# Patient Record
Sex: Female | Born: 1945 | Race: Black or African American | Hispanic: No | Smoking: Never smoker
Health system: Southern US, Community
[De-identification: ages and names within clinical notes are randomized; demographics above are authoritative.]

## PROBLEM LIST (undated history)

## (undated) DIAGNOSIS — K589 Irritable bowel syndrome without diarrhea: Secondary | ICD-10-CM

## (undated) DIAGNOSIS — T4145XA Adverse effect of unspecified anesthetic, initial encounter: Secondary | ICD-10-CM

## (undated) DIAGNOSIS — R7303 Prediabetes: Secondary | ICD-10-CM

## (undated) DIAGNOSIS — I10 Essential (primary) hypertension: Secondary | ICD-10-CM

## (undated) DIAGNOSIS — Z9889 Other specified postprocedural states: Secondary | ICD-10-CM

## (undated) DIAGNOSIS — K5790 Diverticulosis of intestine, part unspecified, without perforation or abscess without bleeding: Secondary | ICD-10-CM

## (undated) DIAGNOSIS — R2232 Localized swelling, mass and lump, left upper limb: Secondary | ICD-10-CM

## (undated) DIAGNOSIS — E669 Obesity, unspecified: Secondary | ICD-10-CM

## (undated) DIAGNOSIS — R112 Nausea with vomiting, unspecified: Secondary | ICD-10-CM

## (undated) DIAGNOSIS — C801 Malignant (primary) neoplasm, unspecified: Secondary | ICD-10-CM

## (undated) DIAGNOSIS — T8859XA Other complications of anesthesia, initial encounter: Secondary | ICD-10-CM

## (undated) DIAGNOSIS — L409 Psoriasis, unspecified: Secondary | ICD-10-CM

## (undated) DIAGNOSIS — E785 Hyperlipidemia, unspecified: Secondary | ICD-10-CM

## (undated) HISTORY — DX: Irritable bowel syndrome, unspecified: K58.9

## (undated) HISTORY — DX: Hyperlipidemia, unspecified: E78.5

## (undated) HISTORY — PX: BREAST SURGERY: SHX581

## (undated) HISTORY — PX: CHOLECYSTECTOMY: SHX55

## (undated) HISTORY — DX: Psoriasis, unspecified: L40.9

## (undated) HISTORY — PX: OTHER SURGICAL HISTORY: SHX169

## (undated) HISTORY — DX: Diverticulosis of intestine, part unspecified, without perforation or abscess without bleeding: K57.90

## (undated) HISTORY — DX: Obesity, unspecified: E66.9

## (undated) HISTORY — DX: Malignant (primary) neoplasm, unspecified: C80.1

## (undated) HISTORY — DX: Essential (primary) hypertension: I10

## (undated) HISTORY — PX: CHOLECYSTECTOMY, LAPAROSCOPIC: SHX56

---
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
## 1977-10-14 HISTORY — PX: TONSILLECTOMY: SUR1361

## 1998-10-14 HISTORY — PX: THYROGLOSSAL DUCT CYST: SHX297

## 1999-01-29 ENCOUNTER — Other Ambulatory Visit: Admission: RE | Admit: 1999-01-29 | Discharge: 1999-01-29 | Payer: Self-pay | Admitting: Nurse Practitioner

## 1999-11-22 ENCOUNTER — Emergency Department (HOSPITAL_COMMUNITY): Admission: EM | Admit: 1999-11-22 | Discharge: 1999-11-22 | Payer: Self-pay | Admitting: Emergency Medicine

## 1999-11-22 ENCOUNTER — Encounter: Payer: Self-pay | Admitting: Emergency Medicine

## 2000-02-18 ENCOUNTER — Other Ambulatory Visit: Admission: RE | Admit: 2000-02-18 | Discharge: 2000-02-18 | Payer: Self-pay | Admitting: Otolaryngology

## 2000-02-18 ENCOUNTER — Encounter (INDEPENDENT_AMBULATORY_CARE_PROVIDER_SITE_OTHER): Payer: Self-pay | Admitting: Specialist

## 2001-10-14 HISTORY — PX: VAGINAL HYSTERECTOMY: SUR661

## 2001-11-02 ENCOUNTER — Other Ambulatory Visit: Admission: RE | Admit: 2001-11-02 | Discharge: 2001-11-02 | Payer: Self-pay | Admitting: Internal Medicine

## 2002-03-16 ENCOUNTER — Encounter (INDEPENDENT_AMBULATORY_CARE_PROVIDER_SITE_OTHER): Payer: Self-pay

## 2002-03-16 ENCOUNTER — Inpatient Hospital Stay (HOSPITAL_COMMUNITY): Admission: RE | Admit: 2002-03-16 | Discharge: 2002-03-18 | Payer: Self-pay | Admitting: Obstetrics and Gynecology

## 2003-06-08 ENCOUNTER — Other Ambulatory Visit: Admission: RE | Admit: 2003-06-08 | Discharge: 2003-06-08 | Payer: Self-pay | Admitting: Obstetrics and Gynecology

## 2004-12-24 ENCOUNTER — Ambulatory Visit: Payer: Self-pay | Admitting: Internal Medicine

## 2004-12-31 ENCOUNTER — Ambulatory Visit: Payer: Self-pay | Admitting: Internal Medicine

## 2006-03-07 ENCOUNTER — Ambulatory Visit: Payer: Self-pay | Admitting: Internal Medicine

## 2006-03-14 ENCOUNTER — Ambulatory Visit: Payer: Self-pay | Admitting: Internal Medicine

## 2006-10-01 ENCOUNTER — Ambulatory Visit: Payer: Self-pay | Admitting: Internal Medicine

## 2006-10-01 LAB — CONVERTED CEMR LAB
BUN: 15 mg/dL (ref 6–23)
CO2: 28 meq/L (ref 19–32)
Calcium: 10 mg/dL (ref 8.4–10.5)
Chloride: 103 meq/L (ref 96–112)
Chol/HDL Ratio, serum: 4.5
Cholesterol: 190 mg/dL (ref 0–200)
Creatinine, Ser: 0.7 mg/dL (ref 0.4–1.2)
GFR calc non Af Amer: 91 mL/min
Glomerular Filtration Rate, Af Am: 110 mL/min/{1.73_m2}
Glucose, Bld: 115 mg/dL — ABNORMAL HIGH (ref 70–99)
HDL: 42.4 mg/dL (ref >39.0)
Hgb A1c MFr Bld: 6.2 % — ABNORMAL HIGH (ref 4.6–6.0)
LDL Cholesterol: 127 mg/dL — ABNORMAL HIGH (ref 0–99)
Potassium: 4 meq/L (ref 3.5–5.1)
Sodium: 139 meq/L (ref 135–145)
Triglyceride fasting, serum: 104 mg/dL (ref 0–149)
VLDL: 21 mg/dL (ref 0–40)

## 2007-06-30 ENCOUNTER — Ambulatory Visit: Payer: Self-pay | Admitting: Internal Medicine

## 2007-06-30 LAB — CONVERTED CEMR LAB
ALT: 19 units/L (ref 0–35)
AST: 17 units/L (ref 0–37)
Albumin: 4 g/dL (ref 3.5–5.2)
Alkaline Phosphatase: 53 units/L (ref 39–117)
BUN: 14 mg/dL (ref 6–23)
Basophils Absolute: 0.1 10*3/uL (ref 0.0–0.1)
Basophils Relative: 2 % — ABNORMAL HIGH (ref 0.0–1.0)
Bilirubin Urine: NEGATIVE
Bilirubin, Direct: 0.1 mg/dL (ref 0.0–0.3)
CO2: 27 meq/L (ref 19–32)
Calcium: 9.6 mg/dL (ref 8.4–10.5)
Chloride: 107 meq/L (ref 96–112)
Cholesterol: 213 mg/dL (ref 0–200)
Creatinine, Ser: 0.8 mg/dL (ref 0.4–1.2)
Direct LDL: 150.5 mg/dL
Eosinophils Absolute: 0.1 10*3/uL (ref 0.0–0.6)
Eosinophils Relative: 3.8 % (ref 0.0–5.0)
GFR calc Af Amer: 94 mL/min
GFR calc non Af Amer: 78 mL/min
Glucose, Bld: 132 mg/dL — ABNORMAL HIGH (ref 70–99)
HCT: 40.9 % (ref 36.0–46.0)
HDL: 34.2 mg/dL — ABNORMAL LOW (ref >39.0)
Hemoglobin, Urine: NEGATIVE
Hemoglobin: 14.1 g/dL (ref 12.0–15.0)
Ketones, ur: NEGATIVE mg/dL
Leukocytes, UA: NEGATIVE
Lymphocytes Relative: 47.1 % — ABNORMAL HIGH (ref 12.0–46.0)
MCHC: 34.6 g/dL (ref 30.0–36.0)
MCV: 86.9 fL (ref 78.0–100.0)
Monocytes Absolute: 0.3 10*3/uL (ref 0.2–0.7)
Monocytes Relative: 8.5 % (ref 3.0–11.0)
Neutro Abs: 1.4 10*3/uL (ref 1.4–7.7)
Neutrophils Relative %: 38.6 % — ABNORMAL LOW (ref 43.0–77.0)
Nitrite: NEGATIVE
Platelets: 210 10*3/uL (ref 150–400)
Potassium: 3.6 meq/L (ref 3.5–5.1)
RBC: 4.7 M/uL (ref 3.87–5.11)
RDW: 12 % (ref 11.5–14.6)
Sodium: 142 meq/L (ref 135–145)
Specific Gravity, Urine: >=1.03 (ref 1.000–1.03)
TSH: 1.63 microintl units/mL (ref 0.35–5.50)
Total Bilirubin: 0.9 mg/dL (ref 0.3–1.2)
Total CHOL/HDL Ratio: 6.2
Total Protein, Urine: NEGATIVE mg/dL
Total Protein: 7 g/dL (ref 6.0–8.3)
Triglycerides: 151 mg/dL — ABNORMAL HIGH (ref 0–149)
Urine Glucose: NEGATIVE mg/dL
Urobilinogen, UA: 0.2 (ref 0.0–1.0)
VLDL: 30 mg/dL (ref 0–40)
WBC: 3.5 10*3/uL — ABNORMAL LOW (ref 4.5–10.5)
pH: 6 (ref 5.0–8.0)

## 2007-07-06 ENCOUNTER — Encounter: Payer: Self-pay | Admitting: Internal Medicine

## 2007-07-06 ENCOUNTER — Ambulatory Visit: Payer: Self-pay | Admitting: Internal Medicine

## 2007-07-06 DIAGNOSIS — E119 Type 2 diabetes mellitus without complications: Secondary | ICD-10-CM

## 2007-07-06 DIAGNOSIS — E785 Hyperlipidemia, unspecified: Secondary | ICD-10-CM | POA: Insufficient documentation

## 2007-07-06 DIAGNOSIS — I10 Essential (primary) hypertension: Secondary | ICD-10-CM

## 2007-07-07 DIAGNOSIS — E669 Obesity, unspecified: Secondary | ICD-10-CM | POA: Insufficient documentation

## 2007-09-04 ENCOUNTER — Ambulatory Visit: Payer: Self-pay | Admitting: Internal Medicine

## 2007-09-04 LAB — CONVERTED CEMR LAB
ALT: 24 units/L (ref 0–35)
AST: 24 units/L (ref 0–37)
Cholesterol: 112 mg/dL (ref 0–200)
HDL: 35.8 mg/dL — ABNORMAL LOW (ref >39.0)
LDL Cholesterol: 64 mg/dL (ref 0–99)
Total CHOL/HDL Ratio: 3.1
Triglycerides: 62 mg/dL (ref 0–149)
VLDL: 12 mg/dL (ref 0–40)

## 2007-09-05 ENCOUNTER — Encounter: Payer: Self-pay | Admitting: Internal Medicine

## 2008-02-13 ENCOUNTER — Encounter: Payer: Self-pay | Admitting: *Deleted

## 2008-02-13 DIAGNOSIS — K589 Irritable bowel syndrome without diarrhea: Secondary | ICD-10-CM

## 2008-02-13 DIAGNOSIS — Z9889 Other specified postprocedural states: Secondary | ICD-10-CM | POA: Insufficient documentation

## 2008-02-13 DIAGNOSIS — K648 Other hemorrhoids: Secondary | ICD-10-CM | POA: Insufficient documentation

## 2008-02-13 DIAGNOSIS — K5730 Diverticulosis of large intestine without perforation or abscess without bleeding: Secondary | ICD-10-CM | POA: Insufficient documentation

## 2008-02-13 DIAGNOSIS — Z9089 Acquired absence of other organs: Secondary | ICD-10-CM

## 2008-09-20 ENCOUNTER — Encounter: Payer: Self-pay | Admitting: Internal Medicine

## 2008-09-26 ENCOUNTER — Ambulatory Visit: Payer: Self-pay | Admitting: Internal Medicine

## 2008-09-26 LAB — CONVERTED CEMR LAB
ALT: 23 units/L (ref 0–35)
AST: 24 units/L (ref 0–37)
Albumin: 4 g/dL (ref 3.5–5.2)
Alkaline Phosphatase: 45 units/L (ref 39–117)
BUN: 15 mg/dL (ref 6–23)
Basophils Absolute: 0 10*3/uL (ref 0.0–0.1)
Basophils Relative: 0.6 % (ref 0.0–3.0)
Bilirubin Urine: NEGATIVE
Bilirubin, Direct: 0.1 mg/dL (ref 0.0–0.3)
CO2: 32 meq/L (ref 19–32)
Calcium: 9.6 mg/dL (ref 8.4–10.5)
Chloride: 104 meq/L (ref 96–112)
Cholesterol: 177 mg/dL (ref 0–200)
Creatinine, Ser: 0.7 mg/dL (ref 0.4–1.2)
Direct LDL: 115.2 mg/dL
Eosinophils Absolute: 0.2 10*3/uL (ref 0.0–0.7)
Eosinophils Relative: 3.7 % (ref 0.0–5.0)
GFR calc Af Amer: 109 mL/min
GFR calc non Af Amer: 90 mL/min
Glucose, Bld: 119 mg/dL — ABNORMAL HIGH (ref 70–99)
HCT: 39 % (ref 36.0–46.0)
HDL: 35.8 mg/dL — ABNORMAL LOW (ref >39.0)
Hemoglobin, Urine: NEGATIVE
Hemoglobin: 13.8 g/dL (ref 12.0–15.0)
Ketones, ur: NEGATIVE mg/dL
Leukocytes, UA: NEGATIVE
Lymphocytes Relative: 48.3 % — ABNORMAL HIGH (ref 12.0–46.0)
MCHC: 35.5 g/dL (ref 30.0–36.0)
MCV: 87.5 fL (ref 78.0–100.0)
Monocytes Absolute: 0.5 10*3/uL (ref 0.1–1.0)
Monocytes Relative: 9.8 % (ref 3.0–12.0)
Neutro Abs: 1.7 10*3/uL (ref 1.4–7.7)
Neutrophils Relative %: 37.6 % — ABNORMAL LOW (ref 43.0–77.0)
Nitrite: NEGATIVE
Platelets: 205 10*3/uL (ref 150–400)
Potassium: 3.4 meq/L — ABNORMAL LOW (ref 3.5–5.1)
RBC: 4.46 M/uL (ref 3.87–5.11)
RDW: 12.2 % (ref 11.5–14.6)
Sodium: 141 meq/L (ref 135–145)
Specific Gravity, Urine: 1.015 (ref 1.000–1.03)
TSH: 1.02 microintl units/mL (ref 0.35–5.50)
Total Bilirubin: 0.6 mg/dL (ref 0.3–1.2)
Total CHOL/HDL Ratio: 4.9
Total Protein, Urine: NEGATIVE mg/dL
Total Protein: 6.7 g/dL (ref 6.0–8.3)
Triglycerides: 202 mg/dL (ref 0–149)
Urine Glucose: NEGATIVE mg/dL
Urobilinogen, UA: 1 (ref 0.0–1.0)
VLDL: 40 mg/dL (ref 0–40)
WBC: 4.6 10*3/uL (ref 4.5–10.5)
pH: 6 (ref 5.0–8.0)

## 2008-10-10 ENCOUNTER — Ambulatory Visit: Payer: Self-pay | Admitting: Internal Medicine

## 2008-12-01 ENCOUNTER — Ambulatory Visit: Payer: Self-pay | Admitting: Internal Medicine

## 2008-12-01 LAB — CONVERTED CEMR LAB
ALT: 19 units/L (ref 0–35)
AST: 20 units/L (ref 0–37)
Albumin: 4.4 g/dL (ref 3.5–5.2)
Alkaline Phosphatase: 48 units/L (ref 39–117)
Bilirubin, Direct: 0.1 mg/dL (ref 0.0–0.3)
Cholesterol: 122 mg/dL (ref 0–200)
HDL: 32.9 mg/dL — ABNORMAL LOW (ref >39.0)
Hgb A1c MFr Bld: 6.8 % — ABNORMAL HIGH (ref 4.6–6.0)
LDL Cholesterol: 72 mg/dL (ref 0–99)
Total Bilirubin: 0.9 mg/dL (ref 0.3–1.2)
Total CHOL/HDL Ratio: 3.7
Total Protein: 7.3 g/dL (ref 6.0–8.3)
Triglycerides: 87 mg/dL (ref 0–149)
VLDL: 17 mg/dL (ref 0–40)

## 2008-12-11 ENCOUNTER — Encounter: Payer: Self-pay | Admitting: Internal Medicine

## 2009-03-23 ENCOUNTER — Ambulatory Visit: Payer: Self-pay | Admitting: Internal Medicine

## 2009-03-23 DIAGNOSIS — R609 Edema, unspecified: Secondary | ICD-10-CM

## 2009-03-23 DIAGNOSIS — R209 Unspecified disturbances of skin sensation: Secondary | ICD-10-CM

## 2009-03-23 LAB — CONVERTED CEMR LAB
BUN: 15 mg/dL (ref 6–23)
CO2: 29 meq/L (ref 19–32)
Calcium: 9.6 mg/dL (ref 8.4–10.5)
Chloride: 105 meq/L (ref 96–112)
Cholesterol: 126 mg/dL (ref 0–200)
Creatinine, Ser: 0.8 mg/dL (ref 0.4–1.2)
GFR calc non Af Amer: 93.13 mL/min (ref >60)
Glucose, Bld: 142 mg/dL — ABNORMAL HIGH (ref 70–99)
HDL: 38.5 mg/dL — ABNORMAL LOW (ref >39.00)
Hgb A1c MFr Bld: 6.9 % — ABNORMAL HIGH (ref 4.6–6.5)
LDL Cholesterol: 72 mg/dL (ref 0–99)
Potassium: 3.7 meq/L (ref 3.5–5.1)
Sodium: 141 meq/L (ref 135–145)
Total CHOL/HDL Ratio: 3
Triglycerides: 77 mg/dL (ref 0.0–149.0)
VLDL: 15.4 mg/dL (ref 0.0–40.0)

## 2009-03-25 DIAGNOSIS — F411 Generalized anxiety disorder: Secondary | ICD-10-CM | POA: Insufficient documentation

## 2009-05-15 IMAGING — CR DG ABDOMEN 1V
2 series · 2 of 2 positions shown · non-contrast
Comparison: CT from [HOSPITAL] dated [DATE]

CLINICAL DATA: Preop for surgery for left renal calculus

ABDOMEN - 1 VIEW

[t abdomen supine * (1 of 2)]
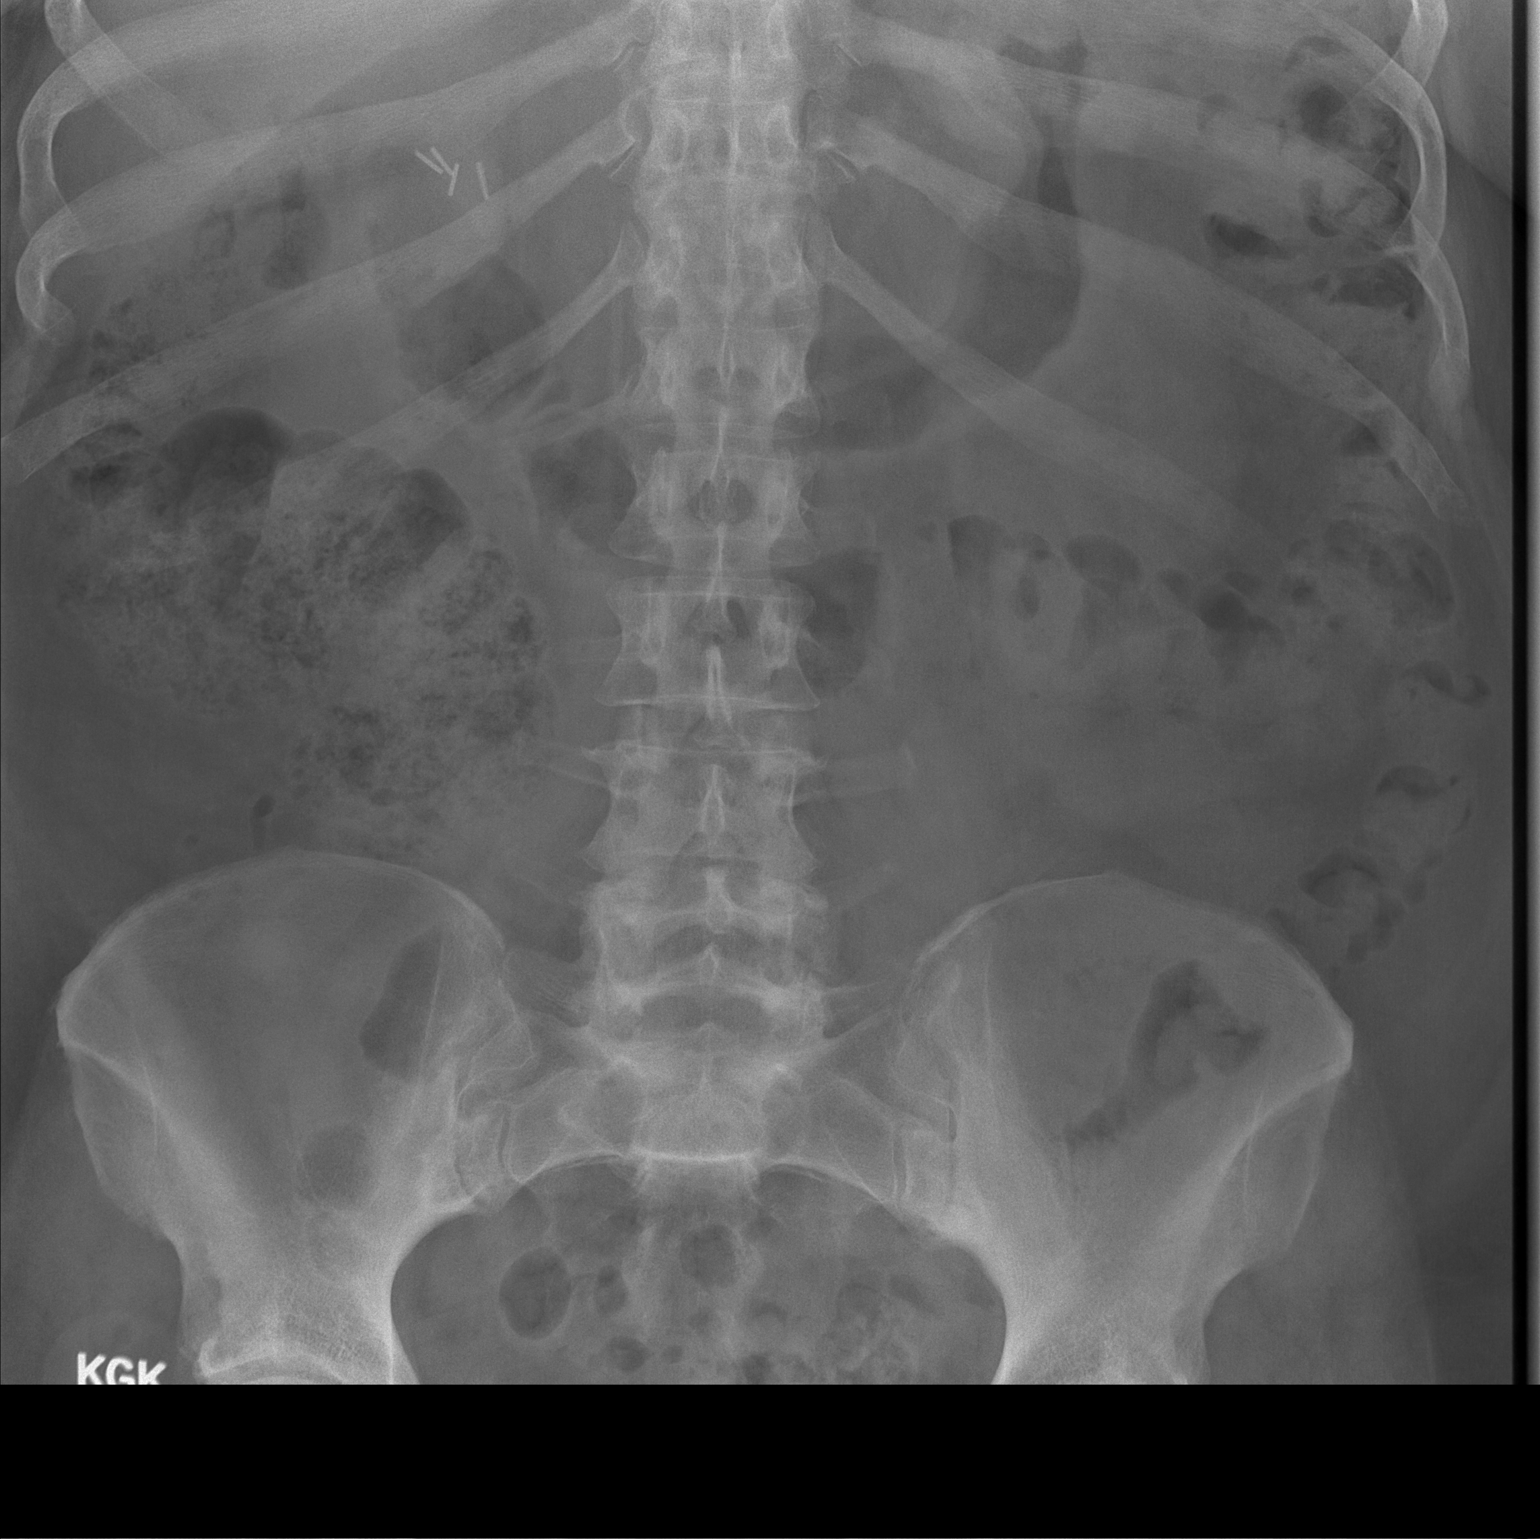

[t abdomen supine * (2 of 2)]
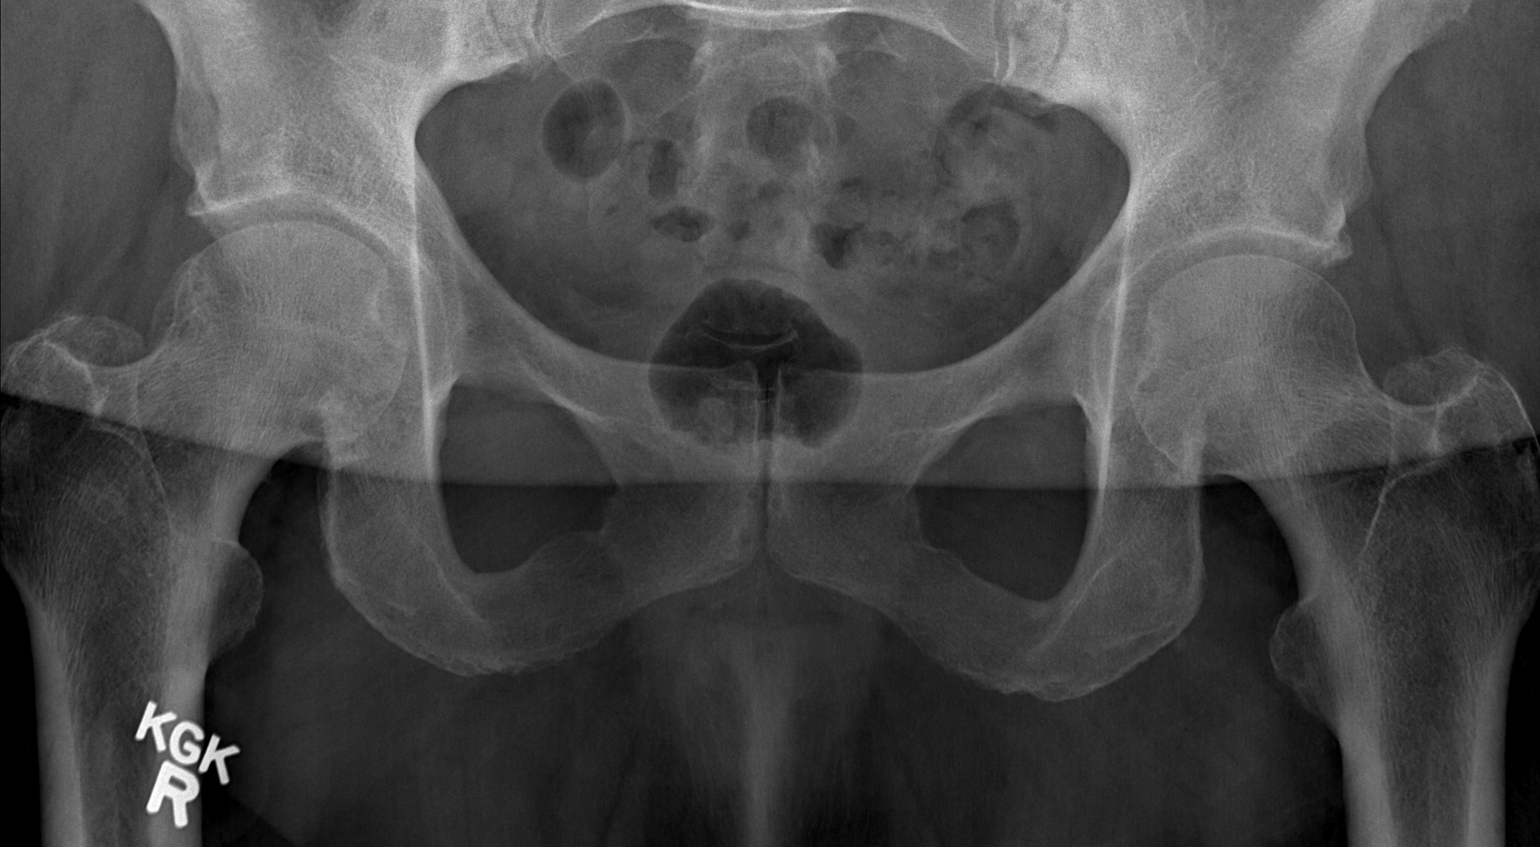

[2 of 2 positions shown; findings below may reference images not displayed]

FINDINGS: Plain film does not demonstrate the left renal pelvic
calculus noted on the prior CT.  No renal or ureteral calculi are
noted with certainty by plain film.  The bowel gas pattern is
nonspecific.  Surgical clips are present in the right upper
quadrant from prior cholecystectomy.
IMPRESSION: No definite renal or ureteral calculus is noted by plain film.

## 2009-07-06 ENCOUNTER — Ambulatory Visit: Payer: Self-pay | Admitting: Internal Medicine

## 2009-10-04 ENCOUNTER — Ambulatory Visit: Payer: Self-pay | Admitting: Internal Medicine

## 2009-10-04 LAB — CONVERTED CEMR LAB
ALT: 28 units/L (ref 0–35)
AST: 24 units/L (ref 0–37)
Albumin: 4.2 g/dL (ref 3.5–5.2)
Alkaline Phosphatase: 42 units/L (ref 39–117)
BUN: 14 mg/dL (ref 6–23)
Basophils Absolute: 0 10*3/uL (ref 0.0–0.1)
Basophils Relative: 0.8 % (ref 0.0–3.0)
Bilirubin Urine: NEGATIVE
Bilirubin, Direct: 0.1 mg/dL (ref 0.0–0.3)
CO2: 27 meq/L (ref 19–32)
Calcium: 9.4 mg/dL (ref 8.4–10.5)
Chloride: 110 meq/L (ref 96–112)
Cholesterol: 124 mg/dL (ref 0–200)
Creatinine, Ser: 0.7 mg/dL (ref 0.4–1.2)
Eosinophils Absolute: 0.2 10*3/uL (ref 0.0–0.7)
Eosinophils Relative: 4.3 % (ref 0.0–5.0)
GFR calc non Af Amer: 108.46 mL/min (ref >60)
Glucose, Bld: 118 mg/dL — ABNORMAL HIGH (ref 70–99)
HCT: 39.7 % (ref 36.0–46.0)
HDL: 38.3 mg/dL — ABNORMAL LOW (ref >39.00)
Hemoglobin, Urine: NEGATIVE
Hemoglobin: 13.2 g/dL (ref 12.0–15.0)
Ketones, ur: NEGATIVE mg/dL
LDL Cholesterol: 68 mg/dL (ref 0–99)
Leukocytes, UA: NEGATIVE
Lymphocytes Relative: 40.8 % (ref 12.0–46.0)
Lymphs Abs: 1.6 10*3/uL (ref 0.7–4.0)
MCHC: 33.2 g/dL (ref 30.0–36.0)
MCV: 89.3 fL (ref 78.0–100.0)
Monocytes Absolute: 0.4 10*3/uL (ref 0.1–1.0)
Monocytes Relative: 9 % (ref 3.0–12.0)
Neutro Abs: 1.8 10*3/uL (ref 1.4–7.7)
Neutrophils Relative %: 45.1 % (ref 43.0–77.0)
Nitrite: NEGATIVE
Platelets: 175 10*3/uL (ref 150.0–400.0)
Potassium: 3.8 meq/L (ref 3.5–5.1)
RBC: 4.45 M/uL (ref 3.87–5.11)
RDW: 12.3 % (ref 11.5–14.6)
Sodium: 145 meq/L (ref 135–145)
Specific Gravity, Urine: >=1.03 (ref 1.000–1.030)
TSH: 1.19 microintl units/mL (ref 0.35–5.50)
Total Bilirubin: 0.8 mg/dL (ref 0.3–1.2)
Total CHOL/HDL Ratio: 3
Total Protein, Urine: NEGATIVE mg/dL
Total Protein: 6.9 g/dL (ref 6.0–8.3)
Triglycerides: 91 mg/dL (ref 0.0–149.0)
Urine Glucose: NEGATIVE mg/dL
Urobilinogen, UA: 0.2 (ref 0.0–1.0)
VLDL: 18.2 mg/dL (ref 0.0–40.0)
WBC: 4 10*3/uL — ABNORMAL LOW (ref 4.5–10.5)
pH: 5.5 (ref 5.0–8.0)

## 2009-10-14 HISTORY — PX: OTHER SURGICAL HISTORY: SHX169

## 2009-10-26 ENCOUNTER — Encounter: Payer: Self-pay | Admitting: Internal Medicine

## 2010-04-20 ENCOUNTER — Ambulatory Visit: Payer: Self-pay | Admitting: Internal Medicine

## 2010-04-20 LAB — CONVERTED CEMR LAB
Alkaline Phosphatase: 45 units/L (ref 39–117)
BUN: 16 mg/dL (ref 6–23)
Basophils Relative: 0.6 % (ref 0.0–3.0)
Bilirubin Urine: NEGATIVE
Bilirubin, Direct: 0.1 mg/dL (ref 0.0–0.3)
CO2: 27 meq/L (ref 19–32)
Chloride: 114 meq/L — ABNORMAL HIGH (ref 96–112)
Eosinophils Absolute: 0.1 10*3/uL (ref 0.0–0.7)
Eosinophils Relative: 4.8 % (ref 0.0–5.0)
Glucose, Bld: 125 mg/dL — ABNORMAL HIGH (ref 70–99)
HDL: 32.5 mg/dL — ABNORMAL LOW (ref >39.00)
LDL Cholesterol: 64 mg/dL (ref 0–99)
Leukocytes, UA: NEGATIVE
Lymphocytes Relative: 47.5 % — ABNORMAL HIGH (ref 12.0–46.0)
MCV: 88.3 fL (ref 78.0–100.0)
Monocytes Absolute: 0.3 10*3/uL (ref 0.1–1.0)
Neutrophils Relative %: 37.6 % — ABNORMAL LOW (ref 43.0–77.0)
Nitrite: NEGATIVE
Platelets: 190 10*3/uL (ref 150.0–400.0)
Potassium: 4.2 meq/L (ref 3.5–5.1)
RBC: 4.47 M/uL (ref 3.87–5.11)
Sodium: 144 meq/L (ref 135–145)
Specific Gravity, Urine: >=1.03 (ref 1.000–1.030)
Total Bilirubin: 0.7 mg/dL (ref 0.3–1.2)
Total Protein, Urine: NEGATIVE mg/dL
Total Protein: 6.6 g/dL (ref 6.0–8.3)
VLDL: 19.2 mg/dL (ref 0.0–40.0)
WBC: 3.1 10*3/uL — ABNORMAL LOW (ref 4.5–10.5)
pH: 6 (ref 5.0–8.0)

## 2010-04-27 ENCOUNTER — Ambulatory Visit: Payer: Self-pay | Admitting: Internal Medicine

## 2010-04-27 DIAGNOSIS — R3129 Other microscopic hematuria: Secondary | ICD-10-CM | POA: Insufficient documentation

## 2010-05-24 ENCOUNTER — Encounter: Payer: Self-pay | Admitting: Internal Medicine

## 2010-06-01 ENCOUNTER — Encounter: Payer: Self-pay | Admitting: Internal Medicine

## 2010-08-02 ENCOUNTER — Ambulatory Visit: Payer: Self-pay | Admitting: Internal Medicine

## 2010-08-02 LAB — CONVERTED CEMR LAB
BUN: 14 mg/dL (ref 6–23)
Calcium: 9.7 mg/dL (ref 8.4–10.5)
Creatinine, Ser: 0.8 mg/dL (ref 0.4–1.2)
GFR calc non Af Amer: 95.48 mL/min (ref >60)
Glucose, Bld: 118 mg/dL — ABNORMAL HIGH (ref 70–99)
Hgb A1c MFr Bld: 6.9 % — ABNORMAL HIGH (ref 4.6–6.5)

## 2010-08-08 ENCOUNTER — Encounter: Payer: Self-pay | Admitting: Internal Medicine

## 2010-08-17 ENCOUNTER — Ambulatory Visit (HOSPITAL_BASED_OUTPATIENT_CLINIC_OR_DEPARTMENT_OTHER): Admission: RE | Admit: 2010-08-17 | Discharge: 2010-08-17 | Payer: Self-pay | Admitting: Urology

## 2010-08-24 ENCOUNTER — Encounter: Payer: Self-pay | Admitting: Internal Medicine

## 2010-11-13 NOTE — Consult Note (Signed)
Summary: [facility]  [facility]   Imported By: Lester Grandview 06/01/2010 10:29:38  _____________________________________________________________________  External Attachment:    Type:   Image     Comment:   External Document

## 2010-11-13 NOTE — Letter (Signed)
   [facility] [address] [place], [place]  [address] Phone: [phone]      August 09, 2010   [facility] [address] [place], [place] [address]  RE:  LAB RESULTS  Dear  Ms. Schepers,  The following is an interpretation of your most recent lab tests.  Please take note of any instructions provided or changes to medications that have resulted from your lab work.  ELECTROLYTES:  Good - no changes needed  KIDNEY FUNCTION TESTS:  Good - no changes needed    DIABETIC STUDIES:  Excellent - no changes needed Blood Glucose: 118   HgbA1C: 6.9      Lab results look good.  Please come see me if you have any questions about these lab results.   Sincerely Yours,    Jacques Navy MD

## 2010-11-13 NOTE — Letter (Signed)
Summary: [facility]   Imported By: Sherian Rein 08/31/2010 11:05:00  _____________________________________________________________________  External Attachment:    Type:   Image     Comment:   External Document

## 2010-11-13 NOTE — Assessment & Plan Note (Signed)
Summary: CPX / NWS  #   Vital Signs:  Patient profile:   65 year old female Height:      63 inches Weight:      284 pounds BMI:     50.49 O2 Sat:      97 % on Room air Temp:     98.5 degrees F oral Pulse rate:   72 / minute BP sitting:   112 / 76  (left arm) Cuff size:   large  Vitals Entered By: Bill Salinas CMA (2010-05-17 2:43 PM)  O2 Flow:  Room air CC: CPX/ ab  Vision Screening:      Vision Comments: Normal Eye exam with Dr Lawerance Bach in June 2010   Primary Care Provider:  Norins  CC:  CPX/ ab.  History of Present Illness: Patinet presents for routine general medical exam. Feels well and is doing well. She does have pedal edema which resolves overnight. She has no other complaints.   Preventive Screening-Counseling & Management  Alcohol-Tobacco     Alcohol drinks/day: <1     Smoking Status: never  Current Medications (verified): 1)  Furosemide 20 Mg Tabs (Furosemide) .Marland Kitchen.. 1 -2 By Mouth Once Daily As Needed Swelling 2)  Monopril 10 Mg  Tabs (Fosinopril Sodium) .... Once Daily 3)  Simvastatin 20 Mg Tabs (Simvastatin) .Marland Kitchen.. 1 By Mouth Q Pm  Allergies (verified): No Known Drug Allergies  Past History:  Past Medical History: Last updated: 03/23/2009 UCD HEMORRHOIDS, INTERNAL (ICD-455.0) DIVERTICULOSIS, COLON (ICD-562.10) IBS (ICD-564.1) OBESITY NOS (ICD-278.00) HYPERTENSION (ICD-401.9) HYPERLIPIDEMIA (ICD-272.4) DIABETES MELLITUS, TYPE II (ICD-250.00)   Anxiety  Past Surgical History: Last updated: 02/13/2008 UMBILICAL HERNIORRHAPHY, HX OF (ICD-V45.89) THYROGLOSSAL DUCT CYST CORRECTION (ICD-759.2) CHOLECYSTECTOMY, LAPAROSCOPIC, HX OF (ICD-V45.79) * TOTAL  VAGINAL HYSTERECTOMY/BSO  Family History: Last updated: May 17, 2010 Father - deceased @ 83:CAD/MI, diabetes, HTN Mother - deceased @ 85:HTN Neg- breast or colon cancer;   Physician roster             Derm - Dr. Terri Piedra             optometrist - Dr. Dorette Grate  Social History: Last updated:  10/10/2008 [employer], [employer] for 2 years married- '68- 2 yrs/widowed; '76- 47yrs/ widowed '93 1 son- '77, 1 daughter- '78; , 2 grandchildren work- Education officer, museum support Lives alone - I-ADLs  Risk Factors: Smoking Status: never (05/17/10)  Family History: Father - deceased @ 83:CAD/MI, diabetes, HTN Mother - deceased @ 85:HTN Neg- breast or colon cancer;   Physician roster             Derm - Dr. Terri Piedra             optometrist - Dr. Dorette Grate  Review of Systems       The patient complains of peripheral edema and incontinence.  The patient denies anorexia, fever, weight loss, weight gain, vision loss, decreased hearing, chest pain, syncope, dyspnea on exertion, prolonged cough, headaches, melena, hematochezia, severe indigestion/heartburn, suspicious skin lesions, difficulty walking, depression, enlarged lymph nodes, and angioedema.    Physical Exam  General:  Overweight AA female in no distress Head:  Normocephalic and atraumatic without obvious abnormalities. No apparent alopecia or balding. Eyes:  No corneal or conjunctival inflammation noted. EOMI. Perrla. Funduscopic exam benign, without hemorrhages, exudates or papilledema. Vision grossly normal. Ears:  External ear exam shows no significant lesions or deformities.  Otoscopic examination reveals clear canals, tympanic membranes are intact bilaterally without bulging, retraction, inflammation or discharge. Hearing is grossly normal bilaterally. Nose:  no external deformity and no external erythema.   Mouth:  no oral lesions, posterior pharynx is clear Neck:  supple, no thyromegaly, and no carotid bruits.   Chest Wall:  No deformities, masses, or tenderness noted. Breasts:  Pendulous, skin normal, nipples without discharge, mild fibrocystic type changes without fixed nodule or mass. Lungs:  Normal respiratory effort, chest expands symmetrically. Lungs are clear to auscultation, no crackles or wheezes. Heart:  normal rate, regular  rhythm, no murmur, no gallop, no rub, and no JVD.   Abdomen:  obese, which hindered further exam Genitalia:  deferred Msk:  normal ROM, no joint tenderness, no joint swelling, no joint warmth, and no joint deformities.   Pulses:  2+ radial and PT pulses Extremities:  1+ distal LE edema Neurologic:  alert & oriented X3, cranial nerves II-XII intact, strength normal in all extremities, gait normal, and DTRs symmetrical and normal.   Skin:  turgor normal, color normal, no rashes, and no suspicious lesions.   Cervical Nodes:  no anterior cervical adenopathy and no posterior cervical adenopathy.   Axillary Nodes:  no R axillary adenopathy and no L axillary adenopathy.   Psych:  Oriented X3, memory intact for recent and remote, normally interactive, and good eye contact.     Impression & Recommendations:  Problem # 1:  MICROSCOPIC HEMATURIA (ICD-599.72) Patient with microscopic hematuria. she is asymptomatic but over 50. Discussed the nature of this as a sentinal sign.  Plan - refer to Urology for evalaution.  Orders: Urology Referral (Urology) Urology Referral (Urology)  Problem # 2:  IBS (ICD-564.1) Stable with no recent flare in symptoms  Problem # 3:  HYPERTENSION (ICD-401.9)  Her updated medication list for this problem includes:    Furosemide 20 Mg Tabs (Furosemide) .Marland Kitchen... 1 -2 by mouth once daily as needed swelling    Monopril 10 Mg Tabs (Fosinopril sodium) ..... Once daily  BP today: 112/76 Prior BP: 124/78 (07/06/2009)  Good control on present medications  Problem # 4:  HYPERLIPIDEMIA (ICD-272.4) Good control with LDL of 64.  Plan - continue present medication.  Her updated medication list for this problem includes:    Simvastatin 20 Mg Tabs (Simvastatin) .Marland Kitchen... 1 by mouth q pm  Problem # 5:  DIABETES MELLITUS, TYPE II (ICD-250.00) No A1C with current labs. Last A1C June '10 was 6.9% with serum glucose of 145. Current last with glucose of 125 - will assume similar  A1C.  Plan - continued dietary management.           Will need A1C at next blood draw.  Her updated medication list for this problem includes:    Monopril 10 Mg Tabs (Fosinopril sodium) ..... Once daily  Problem # 6:  Preventive Health Care (ICD-V70.0) Unremarkable history with normal physical exam except for obesity. Lab results are normal except for microhematuria. She is current with mammography and colonsocopy in '05. She is current with tetnus and is a candidate for shingles and pneumonia vaccine. She is in good spirits and continues to work. Minmal fall risk determined.  In summary - a very nice woman who is medically stable. She will need an A1C at the next blod draw. She is a candidate for vaccines as noted. She will return in 6 months for follow-up.  Complete Medication List: 1)  Furosemide 20 Mg Tabs (Furosemide) .Marland Kitchen.. 1 -2 by mouth once daily as needed swelling 2)  Monopril 10 Mg Tabs (Fosinopril sodium) .... Once daily 3)  Simvastatin 20 Mg Tabs (Simvastatin) .Marland KitchenMarland KitchenMarland Kitchen  1 by mouth q pm  Patient: Connie Marshall Note: All result statuses are Final unless otherwise noted.  Tests: (1) BMP (METABOL)   Sodium                    144 mEq/L                   135-145   Potassium                 4.2 mEq/L                   3.5-5.1   Chloride             [H]  114 mEq/L                   96-112   Carbon Dioxide            27 mEq/L                    19-32   Glucose              [H]  125 mg/dL                   10-27   BUN                       16 mg/dL                    2-53   Creatinine                0.6 mg/dL                   6.6-4.4   Calcium                   9.2 mg/dL                   0.3-47.4   GFR                       122.28 mL/min               >60  Tests: (2) Lipid Panel (LIPID)   Cholesterol               116 mg/dL                   2-595     ATP III Classification            Desirable:  < 200 mg/dL                    Borderline High:  200 - 239 mg/dL               High:  > =  240 mg/dL   Triglycerides             96.0 mg/dL                  6.3-875.6     Normal:  <150 mg/dL     Borderline High:  433 - 199 mg/dL   HDL                  [L]  29.51 mg/dL                 >88.41  VLDL Cholesterol          19.2 mg/dL                  1.6-10.9   LDL Cholesterol           64 mg/dL                    6-04  CHO/HDL Ratio:  CHD Risk                             4                    Men          Women     1/2 Average Risk     3.4          3.3     Average Risk          5.0          4.4     2X Average Risk          9.6          7.1     3X Average Risk          15.0          11.0                           Tests: (3) CBC Platelet w/Diff (CBCD)   White Cell Count     [L]  3.1 K/uL                    4.5-10.5   Red Cell Count            4.47 Mil/uL                 3.87-5.11   Hemoglobin                13.6 g/dL                   54.0-98.1   Hematocrit                39.4 %                      36.0-46.0   MCV                       88.3 fl                     78.0-100.0   MCHC                      34.5 g/dL                   19.1-47.8   RDW                       12.7 %                      11.5-14.6   Platelet Count            190.0 K/uL                  150.0-400.0   Neutrophil %         [  L]  37.6 %                      43.0-77.0   Lymphocyte %         [H]  47.5 %                      12.0-46.0   Monocyte %                9.5 %                       3.0-12.0   Eosinophils%              4.8 %                       0.0-5.0   Basophils %               0.6 %                       0.0-3.0   Neutrophill Absolute [L]  1.2 K/uL                    1.4-7.7   Lymphocyte Absolute       1.5 K/uL                    0.7-4.0   Monocyte Absolute         0.3 K/uL                    0.1-1.0  Eosinophils, Absolute                             0.1 K/uL                    0.0-0.7   Basophils Absolute        0.0 K/uL                    0.0-0.1  Tests: (4) Hepatic/Liver Function Panel  (HEPATIC)   Total Bilirubin           0.7 mg/dL                   1.6-1.0   Direct Bilirubin          0.1 mg/dL                   9.6-0.4   Alkaline Phosphatase      45 U/L                      39-117   AST                       17 U/L                      0-37   ALT                       17 U/L                      0-35   Total Protein             6.6 g/dL  6.0-8.3   Albumin                   4.1 g/dL                    2.7-2.5  Tests: (5) TSH (TSH)   FastTSH                   1.25 uIU/mL                 0.35-5.50  Tests: (6) UDip w/Micro (URINE)   Color                     YELLOW       RANGE:  Yellow;Lt. Yellow   Clarity                   CLEAR                       Clear   Specific Gravity          >=1.030                     1.000 - 1.030   Urine Ph                  6.0                         5.0-8.0   Protein                   NEGATIVE                    Negative   Urine Glucose             NEGATIVE                    Negative   Ketones                   NEGATIVE                    Negative   Urine Bilirubin           NEGATIVE                    Negative   Blood                     LARGE                       Negative   Urobilinogen              1.0                         0.0 - 1.0   Leukocyte Esterace        NEGATIVE                    Negative   Nitrite                   NEGATIVE                    Negative   Urine WBC                 0-2/hpf  0-2/hpf   Urine RBC                 11-20/hpf                   0-2/hpf   Urine Mucus               Presence of                 None   Urine Epith               Few(5-10/hpf)               Rare(0-4/hpf)   Ca Oxalate Crystals       Presence of                 None   Urine Yeast               Presence of                 NonePrescriptions: SIMVASTATIN 20 MG TABS (SIMVASTATIN) 1 by mouth q PM  #30 x 12   Entered and Authorized by:   Jacques Navy MD   Signed by:   Jacques Navy MD on 04/27/2010    Method used:   Electronically to        [facility] (retail)       [address]       [place], [place]  [id number]       Ph: [phone]       Fax: [phone]   RxID:   [id number] MONOPRIL 10 MG  TABS (FOSINOPRIL SODIUM) once daily  #30 Tablet x 12   Entered and Authorized by:   Jacques Navy MD   Signed by:   Jacques Navy MD on 04/27/2010   Method used:   Electronically to        [facility] (retail)       [address]       [place], [place]  [id number]       Ph: [phone]       Fax: [phone]   RxID:   [id number] FUROSEMIDE 20 MG TABS (FUROSEMIDE) 1 -2 by mouth once daily as needed swelling  #60 x 11   Entered and Authorized by:   Jacques Navy MD   Signed by:   Jacques Navy MD on 04/27/2010   Method used:   Electronically to        [facility] (retail)       [address]       [place], [place]  [id number]       Ph: [phone]       Fax: [phone]   RxID:   [id number]    Immunization History:  Influenza Immunization History:    Influenza:  historical (07/18/2009)    Preventive Care Screening  Mammogram:    Date:  10/26/2009    Results:  normal bilateral   Last Flu Shot:    Date:  07/18/2009    Results:  Historical     Preventive Care Screening  Mammogram:    Date:  10/26/2009    Results:  normal bilateral   Last Flu Shot:    Date:  07/18/2009    Results:  Historical

## 2010-11-13 NOTE — Letter (Signed)
[facility]   Imported By: Sherian Rein 06/11/2010 15:05:47  _____________________________________________________________________  External Attachment:    Type:   Image     Comment:   External Document

## 2010-12-25 LAB — POCT I-STAT, CHEM 8
BUN: 17 mg/dL (ref 6–23)
Calcium, Ion: 1.29 mmol/L (ref 1.12–1.32)
Chloride: 108 mEq/L (ref 96–112)
Creatinine, Ser: 0.8 mg/dL (ref 0.4–1.2)
Glucose, Bld: 141 mg/dL — ABNORMAL HIGH (ref 70–99)
HCT: 41 % (ref 36.0–46.0)
Hemoglobin: 13.9 g/dL (ref 12.0–15.0)
Potassium: 3.9 mEq/L (ref 3.5–5.1)
Sodium: 144 mEq/L (ref 135–145)
TCO2: 25 mmol/L (ref 0–100)

## 2011-01-09 ENCOUNTER — Encounter: Payer: Self-pay | Admitting: Internal Medicine

## 2011-03-01 NOTE — Op Note (Signed)
[facility]  Patient:    Connie Marshall, Connie Marshall Visit Number: [id number] MRN: [id number]          Service Type: GYN Location: 9300 [id number] Attending Physician:  Osborn Coho Dictated by:   Janeece Riggers Dareen Piano, M.D. Proc. Date: 03/16/02 Admit Date:  03/16/2002   CC:         Rosalyn Gess. Norins, M.D. [facility] R. Maple Hudson, M.D.   Operative Report  PREOPERATIVE DIAGNOSES:       Symptomatic uterine prolapse.  POSTOPERATIVE DIAGNOSES:      Symptomatic uterine prolapse with rectocele.  PROCEDURE:                    1. Total vaginal hysterectomy, bilateral                                  salpingo-oophorectomy.                               2. Posterior colporrhaphy.  SURGEON:                      Mark E. Dareen Piano, M.D.  ASSIST:                       Miguel Aschoff, M.D.  ANESTHESIA:                   General endotracheal.  ANTIBIOTICS:                  Ancef 1 g.  COMPLICATIONS:                None.  ESTIMATED BLOOD LOSS:         150 cc.  SPECIMEN:                     Fallopian tubes, ovaries, uterus, and cervix sent to pathology.  DRAINS:                       Foley to bed side drainage.  Patient did have 1 inch iodoform packing.  PROCEDURE:                    Patient was taken to the operating room where she was placed in the dorsal supine position.  A general anesthetic was administered without complications.  She was then placed in the dorsal lithotomy position and prepped with Hibiclens.  Her bladder was drained with a Red rubber catheter.  Patient was draped in the usual fashion for this procedure.  A weighted speculum was placed in the vagina.  Lidocaine 1% with epinephrine was injected in a circumferential fashion around the cervix. Posterior cul-de-sac was then entered sharply.  The uterosacral ligaments were bilaterally clamped, cut, and ligated with 0 Monocryl suture.  The anterior cul-de-sac was entered sharply.  The bladder pillars  were then bilaterally clamped, cut, and ligated with 0 Monocryl suture.  The cardinal ligaments were serially clamped, cut, and ligated with 0 Monocryl suture.  The uterine vessels were bilaterally clamped, cut, and ligated with 0 Monocryl suture. Once the level of the fallopian tube was reached the uterus was inverted and the fallopian tube, ovarian ligaments, and the round ligament were clamped, cut, and ligated x2 with 0  Monocryl suture.  The right ovary was then grasped with a Babcock, pulled down into the field.  A Heaney clamp was placed over the infundibulopelvic ligament.  The fallopian tube and ovary were then removed.  This pedicle was doubly ligated with 0 Monocryl suture.  A similar procedure was performed on the opposite side.  Both ovaries appeared to be normal.  At this point 0 Monocryl sutures were placed in the posterior cul-de-sac to reapproximate the uterosacral ligaments in the midline. Following this the vaginal cuff was closed in a running locking fashion using 2-0 Vicryl suture.  At this point the patient was noted to have a sizable rectocele.  It was felt that the patient would soon develop symptoms from this rectocele and at this point it was decided to perform a posterior colporrhaphy.  The posterior vagina was injected with 1% lidocaine with epinephrine.  A vertical skin incision was then made.  The rectocele was dissected away from the vaginal mucosa and then the rectocele was reduced using 2-0 Monocryl in a purse-string fashion.  The perirectal fascia was then reapproximated in the midline using 2-0 Vicryl in a running fashion.  The excess vagina was excised and the vagina then run using 2-0 Vicryl in a running fashion.  At this point the vagina was packed with blanche iodoform gauze and this concluded the procedure.  Instrument and lap counts were correct x2.  Patient was then taken down from Trendelenburg and placed in the dorsal supine position.  At this  point Dr. Maple Hudson entered the room and planned to perform umbilical hernia repair. Dictated by:   Janeece Riggers Dareen Piano, M.D. Attending Physician:  Osborn Coho DD:  03/16/02 TD:  03/17/02 Job: [id number]

## 2011-03-01 NOTE — Discharge Summary (Signed)
[facility]  Patient:    Connie Marshall, Connie Marshall Visit Number: [id number] MRN: [id number]          Service Type: GYN Location: [phone] Attending Physician:  Osborn Coho Dictated by:   Janeece Riggers Dareen Piano, M.D. Admit Date:  03/16/2002 Discharge Date: 03/18/2002                             Discharge Summary  PRINCIPAL DISCHARGE DIAGNOSES:    1. Symptomatic uterine prolapse.                                   2. Symptomatic umbilical hernia.  PRINCIPAL PROCEDURES:             1. Total vaginal hysterectomy.                                   2. Bilateral salpingo-oophorectomy.                                   3. Repair of umbilical hernia with Marlex                                      mesh.  HISTORY OF PRESENT ILLNESS:       The patient is a 65 year old black female, G3, P2-0-1-2, who had been menopausal for 1 year.  The patient presented to the office complaining of a 7- to 20-month history of worsening pelvic pressure and feeling like something was falling out of her vagina.  The patient also noted that there was an increase in size of her umbilical hernia and this was also causing discomfort.  The patient denied any bowel or bladder problems. She also declined an attempt at placement of a pessary.  The patient had been evaluated by Dr. Francina Ames for repair of the umbilical hernia.  HOSPITAL COURSE:                  The patient underwent a total vaginal hysterectomy with bilateral salpingo-oophorectomy and repair of umbilical hernia on March 16, 2002.  The patients postoperative course was uncomplicated. At the time of discharge, the patient was ambulating without difficulty and eating a regular diet.  She had adequate pain control.  The patients preoperative hemoglobin was 13.6, postoperative 11.1.  The patients pathology report revealed a benign endometrial polyp, adenomyosis, leiomyoma, the umbilical hernia was just benign fiber adipose  tissue consistent with omentum. The patient was discharged to home on postoperative day #2.  She was sent home with Tylox to take p.r.n. and instructed to follow up in the office in 4 weeks.  The patient was also instructed to continue taking her current medications. Dictated by:   Janeece Riggers Dareen Piano, M.D. Attending Physician:  Osborn Coho DD:  04/09/02 TD:  04/12/02 Job: [id number]

## 2011-03-01 NOTE — Op Note (Signed)
[facility]  Patient:    Connie Marshall, Connie Marshall Visit Number: [id number] MRN: [id number]          Service Type: GYN Location: [phone] Attending Physician:  Osborn Coho Dictated by:   Rose Phi. Maple Hudson, M.D. Proc. Date: 03/16/02 Admit Date:  03/16/2002                             Operative Report  PREOPERATIVE DIAGNOSIS:  Old incisional hernia from trocar site from laparoscopic cholecystectomy in 1992.  POSTOPERATIVE DIAGNOSIS:  Old incisional hernia from trocar site from laparoscopic cholecystectomy in 1992.  OPERATION:  Repair of same with omentectomy.  SURGEON:  Rose Phi. Maple Hudson, M.D.  ANESTHESIA:  General.  DESCRIPTION OF PROCEDURE:  The patient had undergone a vaginal hysterectomy with repair and then I repaired the hernia.  Following completion of Dr. Linwood Dibbles surgery, we prepped and draped the abdomen.  A curved infraumbilical incision was made with dissection to expose the rather large hernia sac.  It took a long time to free the sac up and I finally ended up opening it and then could not reduce all the omentum that was incarcerated inn it.  I ended up doing an omentectomy between clamps and 2-0 silk ties.  We were then able to reduce it and I was then able to expose the defect which measured about 3.5 to 4 cm in diameter at the max.  We excised the sac and then I closed the defect with interrupted 2-0 Novofil sutures.  I took appropriately trimmed Marlex mesh and placed it on the fascia and sutured it in place with Novofil sutures also.  Following this, we irrigated the wound with saline.  Deep layers were closed with 2-0 Vicryl and then the skin with staples.  Dressing applied.  The patient transferred to the recovery room in satisfactory condition having tolerated the procedure well. Dictated by:   Rose Phi. Maple Hudson, M.D. Attending Physician:  Osborn Coho DD:  03/16/02 TD:  03/16/02 Job: [id number]

## 2011-05-07 ENCOUNTER — Other Ambulatory Visit: Payer: Self-pay | Admitting: Internal Medicine

## 2011-05-20 ENCOUNTER — Other Ambulatory Visit: Payer: Self-pay | Admitting: Internal Medicine

## 2011-06-19 ENCOUNTER — Other Ambulatory Visit: Payer: Self-pay | Admitting: Internal Medicine

## 2011-07-01 ENCOUNTER — Other Ambulatory Visit: Payer: Self-pay

## 2011-07-01 ENCOUNTER — Encounter: Payer: Self-pay | Admitting: Internal Medicine

## 2011-07-31 ENCOUNTER — Ambulatory Visit: Payer: Self-pay

## 2011-07-31 DIAGNOSIS — Z0000 Encounter for general adult medical examination without abnormal findings: Secondary | ICD-10-CM

## 2011-07-31 LAB — CBC WITH DIFFERENTIAL/PLATELET
Basophils Absolute: 0 10*3/uL (ref 0.0–0.1)
HCT: 41.7 % (ref 36.0–46.0)
Lymphs Abs: 1.5 10*3/uL (ref 0.7–4.0)
MCV: 88.7 fl (ref 78.0–100.0)
Monocytes Absolute: 0.3 10*3/uL (ref 0.1–1.0)
Platelets: 211 10*3/uL (ref 150.0–400.0)
RDW: 12.9 % (ref 11.5–14.6)

## 2011-07-31 LAB — URINALYSIS
Bilirubin Urine: NEGATIVE
Hgb urine dipstick: NEGATIVE
Nitrite: NEGATIVE
Total Protein, Urine: NEGATIVE
Urine Glucose: NEGATIVE

## 2011-07-31 LAB — HEPATIC FUNCTION PANEL
ALT: 17 U/L (ref 0–35)
AST: 19 U/L (ref 0–37)
Albumin: 4.3 g/dL (ref 3.5–5.2)

## 2011-07-31 LAB — BASIC METABOLIC PANEL
BUN: 15 mg/dL (ref 6–23)
CO2: 27 mEq/L (ref 19–32)
Chloride: 107 mEq/L (ref 96–112)
Glucose, Bld: 135 mg/dL — ABNORMAL HIGH (ref 70–99)
Potassium: 4.1 mEq/L (ref 3.5–5.1)

## 2011-07-31 LAB — LIPID PANEL: Cholesterol: 110 mg/dL (ref 0–200)

## 2011-08-02 ENCOUNTER — Encounter: Payer: Self-pay | Admitting: Internal Medicine

## 2011-08-05 ENCOUNTER — Encounter: Payer: Self-pay | Admitting: Internal Medicine

## 2011-08-05 ENCOUNTER — Ambulatory Visit (INDEPENDENT_AMBULATORY_CARE_PROVIDER_SITE_OTHER): Payer: Medicare Other | Admitting: Internal Medicine

## 2011-08-05 VITALS — BP 118/78 | HR 70 | Temp 98.0°F | Wt 271.0 lb

## 2011-08-05 DIAGNOSIS — Z0000 Encounter for general adult medical examination without abnormal findings: Secondary | ICD-10-CM

## 2011-08-05 DIAGNOSIS — Z136 Encounter for screening for cardiovascular disorders: Secondary | ICD-10-CM

## 2011-08-05 DIAGNOSIS — I10 Essential (primary) hypertension: Secondary | ICD-10-CM

## 2011-08-05 DIAGNOSIS — K589 Irritable bowel syndrome without diarrhea: Secondary | ICD-10-CM

## 2011-08-05 DIAGNOSIS — E119 Type 2 diabetes mellitus without complications: Secondary | ICD-10-CM

## 2011-08-05 DIAGNOSIS — E669 Obesity, unspecified: Secondary | ICD-10-CM

## 2011-08-05 DIAGNOSIS — E785 Hyperlipidemia, unspecified: Secondary | ICD-10-CM

## 2011-08-05 DIAGNOSIS — Z23 Encounter for immunization: Secondary | ICD-10-CM

## 2011-08-05 MED ORDER — PNEUMOCOCCAL VAC POLYVALENT 25 MCG/0.5ML IJ INJ: 0.5000 mL | INJECTION | Freq: Once | INTRAMUSCULAR | Status: DC

## 2011-08-05 NOTE — Progress Notes (Signed)
Subjective:    Patient ID: Connie Marshall, female    DOB: 03/29/1946, 65 y.o.   MRN: [id number]  HPI  The patient is here for annual Medicare wellness examination and management of other chronic and acute problems. She is feeling well and doing well.   The risk factors are reflected in the social history.  The roster of all physicians providing medical care to patient - is listed in the Snapshot section of the chart.  Activities of daily living:  The patient is 100% inedpendent in all ADLs: dressing, toileting, feeding as well as independent mobility  Home safety : The patient has smoke detectors in the home. They wear seatbelts. firearms are present in the home, kept in a safe fashion. There is no violence in the home.   There is no risks for hepatitis, STDs or HIV. There is no   history of blood transfusion. They have no travel history to infectious disease endemic areas of the world.  The patient has seen their dentist in the last six month. They have seen their eye doctor in the last year. They deny  any hearing difficulty and have not had audiologic testing in the last year.  They do not  have excessive sun exposure. Discussed the need for sun protection: hats, long sleeves and use of sunscreen if there is significant sun exposure.   Diet: the importance of a healthy diet is discussed. They do have a healthy diet.  The patient has a regular exercise program: walking ,  30 minute duration, 7 days per week.  The benefits of regular aerobic exercise were discussed.  Depression screen: there are no signs or vegative symptoms of depression- irritability, change in appetite, anhedonia, sadness/tearfullness.  Cognitive assessment: the patient manages all their financial and personal affairs and is actively engaged.  The following portions of the patient's history were reviewed and updated as appropriate: allergies, current medications, past family history, past medical history,  past  surgical history, past social history  and problem list.  Vision, hearing, body mass index were assessed and reviewed.   During the course of the visit the patient was educated and counseled about appropriate screening and preventive services including : fall prevention , diabetes screening, nutrition counseling, colorectal cancer screening, and recommended immunizations.  Past Medical History  Diagnosis Date  . Hemorrhoids   . Diverticulosis   . IBS (irritable bowel syndrome)   . Obesity   . Hypertension   . Hyperlipidemia   . Diabetes mellitus    Past Surgical History  Procedure Date  . Umbilical herniorrhapy   . Thyroglossal duct cyst   . Cholecystectomy, laparoscopic    Family History  Problem Relation Age of Onset  . Hypertension Mother   . Diabetes Father   . Hypertension Father    History   Social History  . Marital Status: Widowed    Spouse Name: N/A    Number of Children: N/A  . Years of Education: N/A   Occupational History  . Not on file.   Social History Main Topics  . Smoking status: Never Smoker   . Smokeless tobacco: Never Used  . Alcohol Use: No  . Drug Use: No  . Sexually Active: Not Currently   Other Topics Concern  . Not on file   Social History Narrative   HSG, [employer] for 2 years. Married, '68-2 years- widowed; '76- 17 years/ widowed '93. 1 son- '77, 1 daughter- '78, 4 grandchildren. 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 Lives alone - I- ADLS.         Review of Systems Constitutional:  Negative for fever, chills, activity change and unexpected weight change.  HEENT:  Negative for hearing loss, ear pain, congestion, neck stiffness and postnasal drip. Negative for sore throat or swallowing problems. Negative for dental complaints.   Eyes: Negative for vision loss or change in visual acuity.  Respiratory: Negative for chest tightness and wheezing. Negative for DOE.   Cardiovascular: Negative for chest pain or  palpitations. No decreased exercise tolerance Gastrointestinal: No change in bowel habit. No bloating or gas. Occasional reflux or indigestion for which she takes peptobismal. Genitourinary: Negative for urgency, frequency, flank pain and difficulty urinating.  Musculoskeletal: Negative for myalgias, back pain, arthralgias and gait problem.  Neurological: Negative for dizziness, tremors, weakness and headaches.  Hematological: Negative for adenopathy.  Psychiatric/Behavioral: Negative for behavioral problems and dysphoric mood.       Objective:   Physical Exam Vital reviewed - normal except for weight Gen'l - obese AA woman in no distress HEENT - [place]/AT, C&S clear, PERRLA, oropharynx - missing several molars and premolars, remaining dentition OK. No buccal lesions. Posterior pharynx clear Neck- supple, no thyromegaly Nodes - negative cervical and supraclavicular region Back - no deformity or CVAT Lungs - clear, no increased work of breathing, no wheezing Breast exam- deferred to recent mammography Cor- 2+ radial and DP pulses, quiet precordium and a RRR Abdomen- Obese, BS+ no guarding or tenderness Pelvic/rectal deferred Extremities - no C/C/E, no deformities Neuro - A&O x 3, CN II-XII grossly intact, DTR's normal biceps, radial and patellar tendons Derm - clear  Lab Results  Component Value Date   WBC 3.1* 07/31/2011   HGB 14.1 07/31/2011   HCT 41.7 07/31/2011   PLT 211.0 07/31/2011   GLUCOSE 135* 07/31/2011   CHOL 110 07/31/2011   TRIG 79.0 07/31/2011   HDL 42.00 07/31/2011   LDLDIRECT 115.2 09/26/2008   LDLCALC 52 07/31/2011   ALT 17 07/31/2011   AST 19 07/31/2011   NA 140 07/31/2011   K 4.1 07/31/2011   CL 107 07/31/2011   CREATININE 0.8 07/31/2011   BUN 15 07/31/2011   CO2 27 07/31/2011   TSH 1.83 07/31/2011   HGBA1C 6.9* 08/02/2010          Assessment & Plan:

## 2011-08-06 ENCOUNTER — Encounter: Payer: Self-pay | Admitting: Internal Medicine

## 2011-08-06 DIAGNOSIS — Z0001 Encounter for general adult medical examination with abnormal findings: Secondary | ICD-10-CM | POA: Insufficient documentation

## 2011-08-06 NOTE — Assessment & Plan Note (Signed)
BP Readings from Last 3 Encounters:  08/05/11 118/78  04/27/10 112/76  07/06/09 124/78   Excellent control, will continue present regimen

## 2011-08-06 NOTE — Assessment & Plan Note (Signed)
Excellent control based on recent labs.  Plan - continue zocor 20 mg

## 2011-08-06 NOTE — Assessment & Plan Note (Signed)
No recent flares - symptoms are quiesscent

## 2011-08-06 NOTE — Assessment & Plan Note (Signed)
She is aware that her weight is a major risk factor for cardiovascular disease and other complications.  Plan - weight management: smart food choices, PORTION SIZE CONTROL and regular exercise. Target weight 220 lbs. Goal is to loose 2 lbs per month.

## 2011-08-06 NOTE — Assessment & Plan Note (Signed)
Interval history is negative for any new problems, major illness, surgery. Physical exam, sans pelvic and breast, is normal. Lab results are in normal limits. No retrievable record of colonscopy - due for study if not done in the last 10 years. She is current with mammography. Immunizations: Tetanus Jan '08; Pneumonia vaccine - today; flu shot - today; due for shingles vaccine. 12 lead EKG without signs of ischemia or injury.  In summary - a very nice woman who does appear medically stable. She will need to be carefull about sugar and carbs. She is advised to seriously address weight management. She will return in 3 months for an A1C otherwise she will return as needed on in 1 year.

## 2011-08-06 NOTE — Assessment & Plan Note (Signed)
Last A1C 6.9 percent in '11. Her serum glucose is 135!  Plan - better dietary adherence           Follow-up A1C in 3 months

## 2012-01-17 ENCOUNTER — Other Ambulatory Visit: Payer: Medicare Other

## 2012-01-17 DIAGNOSIS — E119 Type 2 diabetes mellitus without complications: Secondary | ICD-10-CM

## 2012-01-17 LAB — HEMOGLOBIN A1C: Hgb A1c MFr Bld: 6.7 % — ABNORMAL HIGH (ref 4.6–6.5)

## 2012-02-10 ENCOUNTER — Encounter: Payer: Self-pay | Admitting: Internal Medicine

## 2012-05-31 ENCOUNTER — Other Ambulatory Visit: Payer: Self-pay | Admitting: Internal Medicine

## 2012-08-20 ENCOUNTER — Encounter: Payer: Self-pay | Admitting: Internal Medicine

## 2012-08-20 ENCOUNTER — Other Ambulatory Visit (INDEPENDENT_AMBULATORY_CARE_PROVIDER_SITE_OTHER): Payer: Medicare Other

## 2012-08-20 ENCOUNTER — Ambulatory Visit (INDEPENDENT_AMBULATORY_CARE_PROVIDER_SITE_OTHER): Payer: Medicare Other | Admitting: Internal Medicine

## 2012-08-20 VITALS — BP 122/76 | HR 75 | Temp 98.4°F | Resp 16 | Wt 267.0 lb

## 2012-08-20 DIAGNOSIS — E785 Hyperlipidemia, unspecified: Secondary | ICD-10-CM

## 2012-08-20 DIAGNOSIS — Z23 Encounter for immunization: Secondary | ICD-10-CM

## 2012-08-20 DIAGNOSIS — E669 Obesity, unspecified: Secondary | ICD-10-CM

## 2012-08-20 DIAGNOSIS — Z0000 Encounter for general adult medical examination without abnormal findings: Secondary | ICD-10-CM

## 2012-08-20 DIAGNOSIS — L988 Other specified disorders of the skin and subcutaneous tissue: Secondary | ICD-10-CM

## 2012-08-20 DIAGNOSIS — E119 Type 2 diabetes mellitus without complications: Secondary | ICD-10-CM

## 2012-08-20 DIAGNOSIS — I10 Essential (primary) hypertension: Secondary | ICD-10-CM

## 2012-08-20 LAB — COMPREHENSIVE METABOLIC PANEL
ALT: 28 U/L (ref 0–35)
Albumin: 4.7 g/dL (ref 3.5–5.2)
Alkaline Phosphatase: 48 U/L (ref 39–117)
Glucose, Bld: 124 mg/dL — ABNORMAL HIGH (ref 70–99)
Potassium: 4.2 mEq/L (ref 3.5–5.1)
Sodium: 140 mEq/L (ref 135–145)
Total Protein: 7.5 g/dL (ref 6.0–8.3)

## 2012-08-20 LAB — HEPATIC FUNCTION PANEL
ALT: 28 U/L (ref 0–35)
AST: 26 U/L (ref 0–37)
Albumin: 4.7 g/dL (ref 3.5–5.2)
Total Protein: 7.5 g/dL (ref 6.0–8.3)

## 2012-08-20 LAB — TSH: TSH: 0.65 u[IU]/mL (ref 0.35–5.50)

## 2012-08-20 LAB — LIPID PANEL
Total CHOL/HDL Ratio: 3
VLDL: 17.6 mg/dL (ref 0.0–40.0)

## 2012-08-20 LAB — HEMOGLOBIN A1C: Hgb A1c MFr Bld: 6.6 % — ABNORMAL HIGH (ref 4.6–6.5)

## 2012-08-20 NOTE — Assessment & Plan Note (Signed)
Interval history unremarkable. Physical exam remarkable for highly suspicious skin lesion RLQ abdomen, obesity. Reports being current with colorectal cancer screening; current with breast cancer screening. Past age for routine PAP. Immunizations are up to date.  In summary - a very nice woman who is medically stable. She is encouraged to develop a regular aerobic exercise program and flex/stretch exercise. [facility].

## 2012-08-20 NOTE — Progress Notes (Signed)
Subjective:    Patient ID: Connie Marshall, female    DOB: January 04, 1946, 66 y.o.   MRN: [id number]  HPI The patient is here for annual Medicare wellness examination and management of other chronic and acute problems. Has been in good health. Reports that she has had cerumen impactions cleared by Dr. Haroldine Laws in Sept '13.    The risk factors are reflected in the social history.  The roster of all physicians providing medical care to patient - is listed in the Snapshot section of the chart.  Activities of daily living:  The patient is 100% inedpendent in all ADLs: dressing, toileting, feeding as well as independent mobility  Home safety : The patient has smoke detectors in the home. Falls- none.  Home is fall safe.  They wear seatbelts.  firearms are present in the home, kept in a safe fashion. There is no violence in the home.   There is no risks for hepatitis, STDs or HIV. There is no history of blood transfusion. They have no travel history to infectious disease endemic areas of the world.  The patient has seen their dentist in the last six month. They have seen their eye doctor in the last year. They deny any hearing difficulty and have not had audiologic testing in the last year.    They do not  have excessive sun exposure. Discussed the need for sun protection: hats, long sleeves and use of sunscreen if there is significant sun exposure.   Diet: the importance of a healthy diet is discussed. They do have a healthy most of the time diet.  The patient has a regular exercise program: walking/grandchildren keep her on the run.The benefits of regular aerobic exercise were discussed.  Depression screen: there are no signs or vegative symptoms of depression- irritability, change in appetite, anhedonia, sadness/tearfullness.  Cognitive assessment: the patient manages all their financial and personal affairs and is actively engaged.   Past Medical History  Diagnosis Date  . Hemorrhoids   .  Diverticulosis   . IBS (irritable bowel syndrome)   . Obesity   . Hypertension   . Hyperlipidemia   . Diabetes mellitus    Past Surgical History  Procedure Date  . Umbilical herniorrhapy   . Thyroglossal duct cyst   . Cholecystectomy, laparoscopic    Family History  Problem Relation Age of Onset  . Hypertension Mother   . Diabetes Father   . Hypertension Father    History   Social History  . Marital Status: Widowed    Spouse Name: N/A    Number of Children: N/A  . Years of Education: N/A   Occupational History  . Not on file.   Social History Main Topics  . Smoking status: Never Smoker   . Smokeless tobacco: Never Used  . Alcohol Use: No  . Drug Use: No  . Sexually Active: Not Currently   Other Topics Concern  . Not on file   Social History Narrative   HSG, [employer] for 2 years. Married, '68-2 years- widowed; '76- 17 years/ widowed '93. 1 son- '77, 1 daughter- '78, 4 grandchildren. 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 Lives alone - I- ADLS.   Vision, hearing, body mass index were assessed and reviewed.   During the course of the visit the patient was educated and counseled about appropriate screening and preventive services including : fall prevention , diabetes screening, nutrition counseling, colorectal cancer screening, and recommended immunizations.  Current Outpatient Prescriptions on File  Prior to Visit  Medication Sig Dispense Refill  . fosinopril (MONOPRIL) 10 MG tablet take 1 tablet by mouth once daily  30 tablet  12  . furosemide (LASIX) 20 MG tablet take 1 to 2 tablets by mouth once daily if needed for SWELLING  60 tablet  11  . simvastatin (ZOCOR) 20 MG tablet take 1 tablet by mouth every evening  30 tablet  12   Current Facility-Administered Medications on File Prior to Visit  Medication Dose Route Frequency Provider Last Rate Last Dose  . pneumococcal 23 valent vaccine (PNU-IMMUNE) injection 0.5 mL  0.5 mL Intramuscular  Once Jacques Navy, MD          Review of Systems Constitutional:  Negative for fever, chills, activity change and unexpected weight change.  HEENT:  Negative for hearing loss, ear pain, congestion, neck stiffness and postnasal drip. Negative for sore throat or swallowing problems. Negative for dental complaints.   Eyes: Negative for vision loss or change in visual acuity.  Respiratory: Negative for chest tightness and wheezing. Negative for DOE.   Cardiovascular: Negative for chest pain or palpitations. No decreased exercise tolerance Gastrointestinal: No change in bowel habit. No bloating or gas. No reflux or indigestion Genitourinary: Negative for urgency, frequency, flank pain and difficulty urinating.  Musculoskeletal: Negative for myalgias, back pain, arthralgias and gait problem.  Neurological: Negative for dizziness, tremors, weakness and headaches.  Hematological: Negative for adenopathy.  Psychiatric/Behavioral: Negative for behavioral problems and dysphoric mood.       Objective:   Physical Exam Filed Vitals:   08/20/12 0902  BP: 122/76  Pulse: 75  Temp: 98.4 F (36.9 C)  Resp: 16   Wt Readings from Last 3 Encounters:  08/20/12 267 lb (121.11 kg)  08/05/11 271 lb (122.925 kg)  04/27/10 284 lb (128.822 kg)   Gen'l: well nourished, well developed AA Woman in no distress HEENT - [place]/AT, EACs/TMs normal, oropharynx with native dentition, missing several teeth but remainder in good condition, no buccal or palatal lesions, posterior pharynx clear, mucous membranes moist. C&S clear, PERRLA, fundi - normal Neck - supple, no thyromegaly Nodes- negative submental, cervical, supraclavicular regions Chest - no deformity, no CVAT Lungs - clear without rales, wheezes. No increased work of breathing Breast - deferred, normal mammogram Cardiovascular - regular rate and rhythm, quiet precordium, no murmurs, rubs or gallops, 2+ radial, DP and PT pulses Abdomen - BS+ x 4, no HSM,  no guarding or rebound or tenderness Pelvic - deferred  Rectal - deferred  Extremities - no clubbing, cyanosis, edema or deformity.  Neuro - A&O x 3, CN II-XII normal, motor strength normal and equal, DTRs 2+ and symmetrical biceps, radial, and patellar tendons. Cerebellar - no tremor, no rigidity, fluid movement and normal gait. Right foot with normal sensation light touch, vibration; good pulse Derm - Head, neck, back, and extremities without suspicious lesions. Abdomen at the right lower quadrant with a 2.5 cm black mole, raised, irregular border  Lab Results  Component Value Date   WBC 3.1* 07/31/2011   HGB 14.1 07/31/2011   HCT 41.7 07/31/2011   PLT 211.0 07/31/2011   GLUCOSE 124* 08/20/2012   CHOL 121 08/20/2012   TRIG 88.0 08/20/2012   HDL 38.90* 08/20/2012   LDLDIRECT 115.2 09/26/2008   LDLCALC 65 08/20/2012        ALT 28 08/20/2012   AST 26 08/20/2012        NA 140 08/20/2012   K 4.2 08/20/2012   CL  108 08/20/2012   CREATININE 0.8 08/20/2012   BUN 12 08/20/2012   CO2 24 08/20/2012   TSH 0.65 08/20/2012   HGBA1C 6.6* 08/20/2012          Assessment & Plan:  Skin lesion uncertain behavior - mole on abdomen HIGHLY suspicious for a melanoma.  Plan  [facility] vs [facility]'s procedure.

## 2012-08-21 ENCOUNTER — Other Ambulatory Visit: Payer: Self-pay | Admitting: Internal Medicine

## 2012-08-23 NOTE — Assessment & Plan Note (Signed)
BP Readings from Last 3 Encounters:  08/20/12 122/76  08/05/11 118/78  04/27/10 112/76   Good control on present medications. Kidney function and electrolytes are normal. Plan - continue present regimen

## 2012-08-23 NOTE — Assessment & Plan Note (Signed)
Lab Results  Component Value Date   HGBA1C 6.6* 08/20/2012   Excellent - keep up the good work with life-style control, including weight loss.

## 2012-08-23 NOTE — Assessment & Plan Note (Signed)
LDL chcolesterol is better than goal of 100 or less. HDL is close to goal of 40 +. Liver functions are normal.  Plan - continue present medication.

## 2012-08-23 NOTE — Assessment & Plan Note (Signed)
Vitals - 1 value per visit 08/20/2012 08/05/2011 04/27/2010  Weight (lb) 267 271 284  HEIGHT   5\' 3"   BMI 47.31 48.02 50.32   Normal BMI is 25. Discussed the importance of weight management to health. Plan - target weight 180 lbs; goal - to loose 2 lbs/month (4 year project)   Smart food choices - go low cal when possible; PORTION SIZE CONTROL - plate your food, single servings, eat slowly; aerobic exercise for 30 min 3 times a week.

## 2013-02-11 ENCOUNTER — Encounter: Payer: Self-pay | Admitting: Internal Medicine

## 2013-03-13 ENCOUNTER — Telehealth: Payer: Self-pay

## 2013-03-13 NOTE — Telephone Encounter (Signed)
Pt walked in to clinic and states she wanted her blood pressure checked.  Per Dr. Selena Batten pt's blood pressure could be checked but if abnormal pt needed to go to ED due full schedule. Since pt was feeling good pt should follow up with PCP per Dr. Selena Batten.  Pt's bp was 168/100.  Pt states she had taken her medication and not missed a dose.  Pt denies chest pain and stoke signs or symptoms.

## 2013-03-15 ENCOUNTER — Ambulatory Visit (INDEPENDENT_AMBULATORY_CARE_PROVIDER_SITE_OTHER): Payer: Medicare Other | Admitting: Internal Medicine

## 2013-03-15 ENCOUNTER — Encounter: Payer: Self-pay | Admitting: Internal Medicine

## 2013-03-15 VITALS — BP 158/90 | HR 91 | Temp 98.6°F | Wt 271.0 lb

## 2013-03-15 DIAGNOSIS — I10 Essential (primary) hypertension: Secondary | ICD-10-CM

## 2013-03-15 MED ORDER — FOSINOPRIL SODIUM 20 MG PO TABS: 20.0000 mg | ORAL_TABLET | Freq: Every day | ORAL | Status: DC

## 2013-03-15 MED ORDER — FUROSEMIDE 40 MG PO TABS: 40.0000 mg | ORAL_TABLET | Freq: Every day | ORAL | Status: DC

## 2013-03-15 NOTE — Patient Instructions (Addendum)
BP has been on the rise to 150's/90s. She has not had any symptoms. Chart reviewed - good control on the rare visits to the doctor. May be a factor of aging and progressive hypertension  Plan  increase fosinopril to 20 mg daily  Increase furosemide to 40 mg daily  Report back BP readings by MyChart with recommendations to follow 

## 2013-03-15 NOTE — Progress Notes (Signed)
  Subjective:    Patient ID: Connie Marshall, female    DOB: 1946/06/26, 67 y.o.   MRN: [id number]  HPI Connie Marshall presents for persistently elevated BP in the 150/100 while taking monopril 10 and furosemide 20 mg. Denies c/p, no bleeding, no double vision, no headache. Low salt diet.   Past Medical History  Diagnosis Date  . Hemorrhoids   . Diverticulosis   . IBS (irritable bowel syndrome)   . Obesity   . Hypertension   . Hyperlipidemia   . Diabetes mellitus    Past Surgical History  Procedure Laterality Date  . Umbilical herniorrhapy    . Thyroglossal duct cyst    . Cholecystectomy, laparoscopic     Family History  Problem Relation Age of Onset  . Hypertension Mother   . Diabetes Father   . Hypertension Father    History   Social History  . Marital Status: Widowed    Spouse Name: N/A    Number of Children: N/A  . Years of Education: N/A   Occupational History  . Not on file.   Social History Main Topics  . Smoking status: Never Smoker   . Smokeless tobacco: Never Used  . Alcohol Use: No  . Drug Use: No  . Sexually Active: Not Currently   Other Topics Concern  . Not on file   Social History Narrative   HSG, [employer] for 2 years. Married, '68-2 years- widowed; '76- 17 years/ widowed '93. 1 son- '77, 1 daughter- '78, 4 grandchildren. 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 Lives alone - I- ADLS.    Current Outpatient Prescriptions on File Prior to Visit  Medication Sig Dispense Refill  . simvastatin (ZOCOR) 20 MG tablet take 1 tablet by mouth every evening  30 tablet  12   Current Facility-Administered Medications on File Prior to Visit  Medication Dose Route Frequency Provider Last Rate Last Dose  . pneumococcal 23 valent vaccine (PNU-IMMUNE) injection 0.5 mL  0.5 mL Intramuscular Once Jacques Navy, MD          Review of Systems System review is negative for any constitutional, cardiac, pulmonary, GI or neuro symptoms or  complaints other than as described in the HPI.     Objective:   Physical Exam Filed Vitals:   03/15/13 1653  BP: 158/90  Pulse: 91  Temp: 98.6 F (37 C)   BP Readings from Last 3 Encounters:  03/15/13 158/90  08/20/12 122/76  08/05/11 118/78   gen'l- obese AA woman in no distress HEENT- PERRLA, Fundi without hemorrhage, normal disc margin Cor - RRR Pulm - CTAP Neuro - nonfocal       Assessment & Plan:

## 2013-03-15 NOTE — Assessment & Plan Note (Signed)
BP has been on the rise to 150's/90s. She has not had any symptoms. Chart reviewed - good control on the rare visits to the doctor. May be a factor of aging and progressive hypertension  Plan  increase fosinopril to 20 mg daily  Increase furosemide to 40 mg daily  Report back BP readings by MyChart with recommendations to follow

## 2013-06-15 ENCOUNTER — Encounter: Payer: Self-pay | Admitting: Internal Medicine

## 2013-06-15 ENCOUNTER — Other Ambulatory Visit: Payer: Self-pay | Admitting: Internal Medicine

## 2013-06-15 MED ORDER — SIMVASTATIN 20 MG PO TABS: ORAL_TABLET | ORAL | Status: DC

## 2013-08-19 ENCOUNTER — Other Ambulatory Visit: Payer: Self-pay

## 2013-09-02 ENCOUNTER — Telehealth: Payer: Self-pay

## 2013-09-02 NOTE — Telephone Encounter (Signed)
Pt called [facility] stating that she is scheduled to have her CPX on Monday and will need to fast for labs. She would like to have labs done prior due to her appt time of 1pm. Thanks

## 2013-09-02 NOTE — Telephone Encounter (Signed)
May have a light breakfast and all the fluids she wants

## 2013-09-03 NOTE — Telephone Encounter (Signed)
Pt notified per MD 

## 2013-09-06 ENCOUNTER — Encounter: Payer: Self-pay | Admitting: Internal Medicine

## 2013-09-06 ENCOUNTER — Ambulatory Visit (INDEPENDENT_AMBULATORY_CARE_PROVIDER_SITE_OTHER): Payer: Medicare Other | Admitting: Internal Medicine

## 2013-09-06 ENCOUNTER — Other Ambulatory Visit (INDEPENDENT_AMBULATORY_CARE_PROVIDER_SITE_OTHER): Payer: Medicare Other

## 2013-09-06 VITALS — BP 140/80 | HR 64 | Temp 97.9°F | Ht 67.5 in | Wt 258.8 lb

## 2013-09-06 DIAGNOSIS — I10 Essential (primary) hypertension: Secondary | ICD-10-CM

## 2013-09-06 DIAGNOSIS — E119 Type 2 diabetes mellitus without complications: Secondary | ICD-10-CM

## 2013-09-06 DIAGNOSIS — E785 Hyperlipidemia, unspecified: Secondary | ICD-10-CM

## 2013-09-06 DIAGNOSIS — E669 Obesity, unspecified: Secondary | ICD-10-CM

## 2013-09-06 DIAGNOSIS — Z23 Encounter for immunization: Secondary | ICD-10-CM

## 2013-09-06 DIAGNOSIS — Z0000 Encounter for general adult medical examination without abnormal findings: Secondary | ICD-10-CM

## 2013-09-06 DIAGNOSIS — F411 Generalized anxiety disorder: Secondary | ICD-10-CM

## 2013-09-06 LAB — TSH: TSH: 1.15 u[IU]/mL (ref 0.35–5.50)

## 2013-09-06 LAB — LIPID PANEL
HDL: 43 mg/dL (ref >39.00)
Total CHOL/HDL Ratio: 2
Triglycerides: 58 mg/dL (ref 0.0–149.0)

## 2013-09-06 LAB — HEMOGLOBIN A1C: Hgb A1c MFr Bld: 6.7 % — ABNORMAL HIGH (ref 4.6–6.5)

## 2013-09-06 LAB — COMPREHENSIVE METABOLIC PANEL
ALT: 28 U/L (ref 0–35)
AST: 34 U/L (ref 0–37)
CO2: 27 mEq/L (ref 19–32)
Calcium: 9.7 mg/dL (ref 8.4–10.5)
Chloride: 105 mEq/L (ref 96–112)
Creatinine, Ser: 0.7 mg/dL (ref 0.4–1.2)
Glucose, Bld: 102 mg/dL — ABNORMAL HIGH (ref 70–99)
Potassium: 3.8 mEq/L (ref 3.5–5.1)
Sodium: 138 mEq/L (ref 135–145)

## 2013-09-06 LAB — HEPATIC FUNCTION PANEL: Albumin: 4.5 g/dL (ref 3.5–5.2)

## 2013-09-06 LAB — HEMOGLOBIN AND HEMATOCRIT, BLOOD: Hemoglobin: 13.9 g/dL (ref 12.0–15.0)

## 2013-09-06 NOTE — Progress Notes (Signed)
Subjective:    Patient ID: Connie Marshall, female    DOB: 05-13-1946, 67 y.o.   MRN: [id number]  HPI The patient is here for annual Medicare wellness examination and management of other chronic and acute problems.  Interval history - doing just fine with no major illness, surgery or injury.   The risk factors are reflected in the social history.  The roster of all physicians providing medical care to patient - is listed in the Snapshot section of the chart.  Activities of daily living:  The patient is 100% inedpendent in all ADLs: dressing, toileting, feeding as well as independent mobility  Home safety : The patient has smoke detectors in the home. Falls - none. HOme is fall safe. They wear seatbelts.  firearms are present in the home, kept in a safe fashion. There is no violence in the home.   There is no risks for hepatitis, STDs or HIV. There is no  history of blood transfusion. They have no travel history to infectious disease endemic areas of the world.  The patient has seen their dentist in the last six month. They have seen their eye doctor in the last year. They deny any hearing difficulty and have not had audiologic testing in the last year.    They do not  have excessive sun exposure. Discussed the need for sun protection: hats, long sleeves and use of sunscreen if there is significant sun exposure.   Diet: the importance of a healthy diet is discussed. They do have a healthy diet.  The patient has a regular exercise program: walking , 20 min duration, 5 per week.  The benefits of regular aerobic exercise were discussed.  Depression screen: there are no signs or vegative symptoms of depression- irritability, change in appetite, anhedonia, sadness/tearfullness.  Cognitive assessment: the patient manages all their financial and personal affairs and is actively engaged.   The following portions of the patient's history were reviewed and updated as appropriate: allergies,  current medications, past family history, past medical history,  past surgical history, past social history  and problem list.  Vision, hearing, body mass index were assessed and reviewed.   During the course of the visit the patient was educated and counseled about appropriate screening and preventive services including : fall prevention , diabetes screening, nutrition counseling, colorectal cancer screening, and recommended immunizations.  Past Medical History  Diagnosis Date  . Hemorrhoids   . Diverticulosis   . IBS (irritable bowel syndrome)   . Obesity   . Hypertension   . Hyperlipidemia   . Diabetes mellitus    Past Surgical History  Procedure Laterality Date  . Umbilical herniorrhapy    . Thyroglossal duct cyst    . Cholecystectomy, laparoscopic     Family History  Problem Relation Age of Onset  . Hypertension Mother   . Diabetes Father   . Hypertension Father    History   Social History  . Marital Status: Widowed    Spouse Name: N/A    Number of Children: N/A  . Years of Education: N/A   Occupational History  . Not on file.   Social History Main Topics  . Smoking status: Never Smoker   . Smokeless tobacco: Never Used  . Alcohol Use: No  . Drug Use: No  . Sexual Activity: Not Currently   Other Topics Concern  . Not on file   Social History Narrative   HSG, [employer] for 2 years. Married, '68-2 years- widowed; '76-  17 years/ widowed '93. 1 son- '77, 1 daughter- '78, 4 grandchildren. 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 Lives alone - I- ADLS.     Current Outpatient Prescriptions on File Prior to Visit  Medication Sig Dispense Refill  . fosinopril (MONOPRIL) 20 MG tablet Take 1 tablet (20 mg total) by mouth daily.  30 tablet  12  . furosemide (LASIX) 40 MG tablet Take 1 tablet (40 mg total) by mouth daily.  30 tablet  11  . simvastatin (ZOCOR) 20 MG tablet take 1 tablet by mouth every evening  30 tablet  12   Current  Facility-Administered Medications on File Prior to Visit  Medication Dose Route Frequency Provider Last Rate Last Dose  . pneumococcal 23 valent vaccine (PNU-IMMUNE) injection 0.5 mL  0.5 mL Intramuscular Once Jacques Navy, MD          Review of Systems     Objective:   Physical Exam        Assessment & Plan:

## 2013-09-06 NOTE — Assessment & Plan Note (Signed)
Stable and doing well. 

## 2013-09-06 NOTE — Assessment & Plan Note (Signed)
Taking and tolerating "statin" therapy.  Plan Lab today with recommendations to follow.

## 2013-09-06 NOTE — Patient Instructions (Signed)
Thanks for coming to see me.  You have a normal exam except you do need to continue the great job you are doing of weight management. Remember: Diet management: smart food choices, PORTION SIZE CONTROL, regular exercise. Goal - to loose 1-2 lbs.month. Target weight - 200 lbs.  You will have lab today and the results will be posted to MyChart in the next 48 hrs.  Please return for a nurse visit in 2-3 weeks for Prevnar 13 pneumonia vaccine.  You are up to date with colorectal and breast cancer screening.  You have a great holiday, especially with those grandchildren - they make the holidays happen.

## 2013-09-06 NOTE — Progress Notes (Signed)
Pre visit review using our clinic review tool, if applicable. No additional management support is needed unless otherwise documented below in the visit note. 

## 2013-09-06 NOTE — Assessment & Plan Note (Signed)
BP Readings from Last 3 Encounters:  09/06/13 140/80  03/15/13 158/90  08/20/12 122/76   Adequate control -   Plan  Bmet for routine follow up  Continue present medications

## 2013-09-06 NOTE — Assessment & Plan Note (Signed)
Continuing to work on weight loss - down 14 lbs from June.  Plan Continue weight management program.

## 2013-09-06 NOTE — Assessment & Plan Note (Signed)
Interval history is negative. Physical exam is ok sans breast and pelvic. Labs pending. She is current with both colorectal and breast cancer screening. Immunizations - she is current but due prevnar - will return in 2-3 weeks for nurse visit and immunization.  In summary A very nice woman whose biggest problem is weight management with which she is doing a great job.

## 2013-09-06 NOTE — Assessment & Plan Note (Signed)
Doing well - working on weight loss.  Plan -  A1C today with recommendations to follow.

## 2013-09-27 ENCOUNTER — Ambulatory Visit (INDEPENDENT_AMBULATORY_CARE_PROVIDER_SITE_OTHER): Payer: Medicare Other

## 2013-09-27 DIAGNOSIS — Z23 Encounter for immunization: Secondary | ICD-10-CM

## 2013-11-18 ENCOUNTER — Encounter: Payer: Self-pay | Admitting: Internal Medicine

## 2013-12-14 ENCOUNTER — Encounter: Payer: Self-pay | Admitting: Internal Medicine

## 2014-03-01 LAB — HM MAMMOGRAPHY

## 2014-03-15 ENCOUNTER — Other Ambulatory Visit: Payer: Self-pay

## 2014-03-15 MED ORDER — FOSINOPRIL SODIUM 20 MG PO TABS: 20.0000 mg | ORAL_TABLET | Freq: Every day | ORAL | Status: DC

## 2014-03-16 ENCOUNTER — Encounter: Payer: Self-pay | Admitting: Internal Medicine

## 2014-03-17 ENCOUNTER — Other Ambulatory Visit: Payer: Self-pay | Admitting: Internal Medicine

## 2014-03-30 ENCOUNTER — Encounter: Payer: Self-pay | Admitting: Internal Medicine

## 2014-05-27 ENCOUNTER — Ambulatory Visit (AMBULATORY_SURGERY_CENTER): Payer: Self-pay

## 2014-05-27 VITALS — Ht 67.0 in | Wt 271.2 lb

## 2014-05-27 DIAGNOSIS — Z1211 Encounter for screening for malignant neoplasm of colon: Secondary | ICD-10-CM

## 2014-05-27 MED ORDER — MOVIPREP 100 G PO SOLR: ORAL | Status: DC

## 2014-05-27 NOTE — Progress Notes (Signed)
Per pt, no allergies to soy or egg products.Pt not taking any weight loss meds or using  O2 at home.     Per pt, "very groggy" past some sedation.Sedation can stay in her body for a long time.

## 2014-06-10 ENCOUNTER — Ambulatory Visit (AMBULATORY_SURGERY_CENTER): Payer: Medicare Other | Admitting: Internal Medicine

## 2014-06-10 ENCOUNTER — Encounter: Payer: Self-pay | Admitting: Internal Medicine

## 2014-06-10 VITALS — BP 126/64 | HR 60 | Temp 96.6°F | Resp 21 | Ht 67.0 in | Wt 271.0 lb

## 2014-06-10 DIAGNOSIS — Z1211 Encounter for screening for malignant neoplasm of colon: Secondary | ICD-10-CM

## 2014-06-10 MED ORDER — SODIUM CHLORIDE 0.9 % IV SOLN: 500.0000 mL | INTRAVENOUS | Status: DC

## 2014-06-10 NOTE — Op Note (Signed)
[facility]  [address] [place], [id number]   COLONOSCOPY PROCEDURE REPORT  PATIENT: Connie Marshall, Connie Marshall  MR#: [id number] BIRTHDATE: 1945/11/30 , 68  yrs. old GENDER: Female ENDOSCOPIST: Lafayette Dragon, MD REFERRED BB:[id number] John, M.D. PROCEDURE DATE:  06/10/2014 PROCEDURE:   Colonoscopy, screening First Screening Colonoscopy - Avg.  risk and is 50 yrs.  old or older - No.  Prior Negative Screening - Now for repeat screening. 10 or more years since last screening  History of Adenoma - Now for follow-up colonoscopy & has been > or = to 3 yrs.  N/A  Polyps Removed Today? No.  Recommend repeat exam, <10 yrs? No. ASA CLASS:   Class I INDICATIONS:Average risk patient for colon cancer and prior flexible sigmoidoscopy in 1997 and a colonoscopy in March 2005.  Mild diverticulosis and hemorrhoids. MEDICATIONS: MAC sedation, administered by CRNA, propofol (Diprivan) 200mg  IV, and Atropine .5 mg IV  DESCRIPTION OF PROCEDURE:   After the risks benefits and alternatives of the procedure were thoroughly explained, informed consent was obtained.  A digital rectal exam revealed no abnormalities of the rectum.   The LB [id number]  endoscope was introduced through the anus and advanced to the cecum, which was identified by both the appendix and ileocecal valve. No adverse events experienced.   The quality of the prep was good, using MoviPrep  The instrument was then slowly withdrawn as the colon was fully examined.      COLON FINDINGS: There was mild scattered diverticulosis noted in the sigmoid colon.   Small internal hemorrhoids were found. Retroflexed views revealed no abnormalities. The time to cecum=7 minutes 53 seconds.  Withdrawal time=6 minutes 03 seconds.  The scope was withdrawn and the procedure completed. COMPLICATIONS: There were no complications.  ENDOSCOPIC IMPRESSION: 1.   There was mild diverticulosis noted in the sigmoid colon 2.   Small internal  hemorrhoids 3 . 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

## 2014-06-10 NOTE — Progress Notes (Signed)
Procedure ends, too recovery awake, report given and VSS.

## 2014-06-10 NOTE — Patient Instructions (Signed)
YOU HAD AN ENDOSCOPIC PROCEDURE TODAY AT THE [facility] [facility]: Refer to the procedure report that was given to you for any specific questions about what was found during the examination.  If the procedure report does not answer your questions, please call your gastroenterologist to clarify.  If you requested that your care partner not be given the details of your procedure findings, then the procedure report has been included in a sealed envelope for you to review at your convenience later.  YOU SHOULD EXPECT: Some feelings of bloating in the abdomen. Passage of more gas than usual.  Walking can help get rid of the air that was put into your GI tract during the procedure and reduce the bloating. If you had a lower endoscopy (such as a colonoscopy or flexible sigmoidoscopy) you may notice spotting of blood in your stool or on the toilet paper. If you underwent a bowel prep for your procedure, then you may not have a normal bowel movement for a few days.  DIET: Your first meal following the procedure should be a light meal and then it is ok to progress to your normal diet.  A half-sandwich or bowl of soup is an example of a good first meal.  Heavy or fried foods are harder to digest and may make you feel nauseous or bloated.  Likewise meals heavy in dairy and vegetables can cause extra gas to form and this can also increase the bloating.  Drink plenty of fluids but you should avoid alcoholic beverages for 24 hours.  ACTIVITY: Your care partner should take you home directly after the procedure.  You should plan to take it easy, moving slowly for the rest of the day.  You can resume normal activity the day after the procedure however you should NOT DRIVE or use heavy machinery for 24 hours (because of the sedation medicines used during the test).    SYMPTOMS TO REPORT IMMEDIATELY: A gastroenterologist can be reached at any hour.  During normal business hours, 8:30 AM to 5:00 PM Monday through Friday,  call [phone].  After hours and on weekends, please call the GI answering service at [phone] who will take a message and have the physician on call contact you.   Following lower endoscopy (colonoscopy or flexible sigmoidoscopy):  Excessive amounts of blood in the stool  Significant tenderness or worsening of abdominal pains  Swelling of the abdomen that is new, acute  Fever of 100F or higher    FOLLOW UP: If any biopsies were taken you will be contacted by phone or by letter within the next 1-3 weeks.  Call your gastroenterologist if you have not heard about the biopsies in 3 weeks.  Our staff will call the home number listed on your records the next business day following your procedure to check on you and address any questions or concerns that you may have at that time regarding the information given to you following your procedure. This is a courtesy call and so if there is no answer at the home number and we have not heard from you through the emergency physician on call, we will assume that you have returned to your regular daily activities without incident.  SIGNATURES/CONFIDENTIALITY: You and/or your care partner have signed paperwork which will be entered into your electronic medical record.  These signatures attest to the fact that that the information above on your After Visit Summary has been reviewed and is understood.  Full responsibility of the confidentiality   of this discharge information lies with you and/or your care-partner.   INFORMATION ON DIVERTICULOSIS AND HEMORRHOIDS & HIGH FIBER DIET GIVEN TO YOU TODAY  Diverticulosis Diverticulosis is the condition that develops when small pouches (diverticula) form in the wall of your colon. Your colon, or large intestine, is where water is absorbed and stool is formed. The pouches form when the inside layer of your colon pushes through weak spots in the outer layers of your colon. CAUSES  No one knows exactly what  causes diverticulosis. RISK FACTORS  Being older than 53. Your risk for this condition increases with age. Diverticulosis is rare in people younger than 40 years. By age 71, almost everyone has it.  Eating a low-fiber diet.  Being frequently constipated.  Being overweight.  Not getting enough exercise.  Smoking.  Taking over-the-counter pain medicines, like aspirin and ibuprofen. SYMPTOMS  Most people with diverticulosis do not have symptoms. DIAGNOSIS  Because diverticulosis often has no symptoms, health care providers often discover the condition during an exam for other colon problems. In many cases, a health care provider will diagnose diverticulosis while using a flexible scope to examine the colon (colonoscopy). TREATMENT  If you have never developed an infection related to diverticulosis, you may not need treatment. If you have had an infection before, treatment may include:  Eating more fruits, vegetables, and grains.  Taking a fiber supplement.  Taking a live bacteria supplement (probiotic).  Taking medicine to relax your colon. HOME CARE INSTRUCTIONS   Drink at least 6-8 glasses of water each day to prevent constipation.  Try not to strain when you have a bowel movement.  Keep all follow-up appointments. If you have had an infection before:  Increase the fiber in your diet as directed by your health care provider or dietitian.  Take a dietary fiber supplement if your health care provider approves.  Only take medicines as directed by your health care provider. SEEK MEDICAL CARE IF:   You have abdominal pain.  You have bloating.  You have cramps.  You have not gone to the bathroom in 3 days. SEEK IMMEDIATE MEDICAL CARE IF:   Your pain gets worse.  Yourbloating becomes very bad.  You have a fever or chills, and your symptoms suddenly get worse.  You begin vomiting.  You have bowel movements that are bloody or black. MAKE SURE YOU:  Understand  these instructions.  Will watch your condition.  Will get help right away if you are not doing well or get worse. Document Released: 06/27/2004 Document Revised: 10/05/2013 Document Reviewed: 08/25/2013 [facility] Patient Information 2015 [place], [place]. This information is not intended to replace advice given to you by your health care provider. Make sure you discuss any questions you have with your health care provider.

## 2014-06-13 ENCOUNTER — Telehealth: Payer: Self-pay

## 2014-06-13 NOTE — Telephone Encounter (Signed)

## 2014-07-25 ENCOUNTER — Other Ambulatory Visit: Payer: Self-pay | Admitting: *Deleted

## 2014-07-25 MED ORDER — SIMVASTATIN 20 MG PO TABS: ORAL_TABLET | ORAL | Status: DC

## 2014-08-24 ENCOUNTER — Other Ambulatory Visit: Payer: Self-pay | Admitting: Internal Medicine

## 2014-09-07 ENCOUNTER — Encounter: Payer: Self-pay | Admitting: Internal Medicine

## 2014-09-07 ENCOUNTER — Ambulatory Visit (INDEPENDENT_AMBULATORY_CARE_PROVIDER_SITE_OTHER): Payer: Medicare Other | Admitting: Internal Medicine

## 2014-09-07 ENCOUNTER — Other Ambulatory Visit (INDEPENDENT_AMBULATORY_CARE_PROVIDER_SITE_OTHER): Payer: Medicare Other

## 2014-09-07 VITALS — BP 120/68 | HR 68 | Temp 98.0°F | Ht 67.0 in | Wt 251.1 lb

## 2014-09-07 DIAGNOSIS — Z23 Encounter for immunization: Secondary | ICD-10-CM

## 2014-09-07 DIAGNOSIS — Z0000 Encounter for general adult medical examination without abnormal findings: Secondary | ICD-10-CM

## 2014-09-07 DIAGNOSIS — I10 Essential (primary) hypertension: Secondary | ICD-10-CM

## 2014-09-07 DIAGNOSIS — E119 Type 2 diabetes mellitus without complications: Secondary | ICD-10-CM

## 2014-09-07 DIAGNOSIS — R8290 Unspecified abnormal findings in urine: Secondary | ICD-10-CM

## 2014-09-07 LAB — URINALYSIS, ROUTINE W REFLEX MICROSCOPIC
Bilirubin Urine: NEGATIVE
Hgb urine dipstick: NEGATIVE
KETONES UR: NEGATIVE
Leukocytes, UA: NEGATIVE
Nitrite: NEGATIVE
RBC / HPF: NONE SEEN (ref 0–?)
SPECIFIC GRAVITY, URINE: 1.025 (ref 1.000–1.030)
TOTAL PROTEIN, URINE-UPE24: NEGATIVE
URINE GLUCOSE: NEGATIVE
UROBILINOGEN UA: 0.2 (ref 0.0–1.0)
pH: 6 (ref 5.0–8.0)

## 2014-09-07 LAB — BASIC METABOLIC PANEL
BUN: 13 mg/dL (ref 6–23)
CALCIUM: 9.7 mg/dL (ref 8.4–10.5)
CO2: 24 mEq/L (ref 19–32)
Chloride: 105 mEq/L (ref 96–112)
Creatinine, Ser: 0.7 mg/dL (ref 0.4–1.2)
GFR: 103.42 mL/min (ref >60.00)
Glucose, Bld: 115 mg/dL — ABNORMAL HIGH (ref 70–99)
Potassium: 3.8 mEq/L (ref 3.5–5.1)
SODIUM: 139 meq/L (ref 135–145)

## 2014-09-07 LAB — CBC WITH DIFFERENTIAL/PLATELET
BASOS PCT: 0.9 % (ref 0.0–3.0)
Basophils Absolute: 0 10*3/uL (ref 0.0–0.1)
EOS ABS: 0.1 10*3/uL (ref 0.0–0.7)
Eosinophils Relative: 3.7 % (ref 0.0–5.0)
HEMATOCRIT: 43.1 % (ref 36.0–46.0)
HEMOGLOBIN: 14.2 g/dL (ref 12.0–15.0)
LYMPHS ABS: 1.6 10*3/uL (ref 0.7–4.0)
Lymphocytes Relative: 46.2 % — ABNORMAL HIGH (ref 12.0–46.0)
MCHC: 32.9 g/dL (ref 30.0–36.0)
MCV: 87.6 fl (ref 78.0–100.0)
Monocytes Absolute: 0.3 10*3/uL (ref 0.1–1.0)
Monocytes Relative: 8.6 % (ref 3.0–12.0)
NEUTROS ABS: 1.4 10*3/uL (ref 1.4–7.7)
Neutrophils Relative %: 40.6 % — ABNORMAL LOW (ref 43.0–77.0)
Platelets: 196 10*3/uL (ref 150.0–400.0)
RBC: 4.92 Mil/uL (ref 3.87–5.11)
RDW: 12.6 % (ref 11.5–15.5)
WBC: 3.4 10*3/uL — ABNORMAL LOW (ref 4.0–10.5)

## 2014-09-07 LAB — LIPID PANEL
Cholesterol: 103 mg/dL (ref 0–200)
HDL: 36.8 mg/dL — ABNORMAL LOW
LDL Cholesterol: 51 mg/dL (ref 0–99)
NonHDL: 66.2
Total CHOL/HDL Ratio: 3
Triglycerides: 74 mg/dL (ref 0.0–149.0)
VLDL: 14.8 mg/dL (ref 0.0–40.0)

## 2014-09-07 LAB — HEPATIC FUNCTION PANEL
ALK PHOS: 48 U/L (ref 39–117)
ALT: 23 U/L (ref 0–35)
AST: 32 U/L (ref 0–37)
Albumin: 4.6 g/dL (ref 3.5–5.2)
BILIRUBIN DIRECT: 0.1 mg/dL (ref 0.0–0.3)
BILIRUBIN TOTAL: 0.7 mg/dL (ref 0.2–1.2)
Total Protein: 7.3 g/dL (ref 6.0–8.3)

## 2014-09-07 LAB — MICROALBUMIN / CREATININE URINE RATIO
Creatinine,U: 221.2 mg/dL
Microalb Creat Ratio: 0.4 mg/g (ref 0.0–30.0)
Microalb, Ur: 0.9 mg/dL (ref 0.0–1.9)

## 2014-09-07 LAB — HEMOGLOBIN A1C: Hgb A1c MFr Bld: 6.9 % — ABNORMAL HIGH (ref 4.6–6.5)

## 2014-09-07 LAB — TSH: TSH: 1.01 u[IU]/mL (ref 0.35–4.50)

## 2014-09-07 NOTE — Progress Notes (Signed)
Pre visit review using our clinic review tool, if applicable. No additional management support is needed unless otherwise documented below in the visit note. 

## 2014-09-07 NOTE — Assessment & Plan Note (Signed)
stable overall by history and exam, recent data reviewed with pt, and pt to continue medical treatment as before,  to f/u any worsening symptoms or concerns BP Readings from Last 3 Encounters:  09/07/14 120/68  06/10/14 126/64  09/06/13 140/80

## 2014-09-07 NOTE — Patient Instructions (Addendum)
You had the flu shot today  Your left ear was irrigated of wax today  Please continue all other medications as before, and refills have been done if requested.  Please have the pharmacy call with any other refills you may need.  Please continue your efforts at being more active, low cholesterol diet, and weight control.  You are otherwise up to date with prevention measures today.  Please keep your appointments with your specialists as you may have planned  Please go to the LAB in the Basement (turn left off the elevator) for the tests to be done today  You will be contacted by phone if any changes need to be made immediately.  Otherwise, you will receive a letter about your results with an explanation, but please check with MyChart first.  Please remember to sign up for MyChart if you have not done so, as this will be important to you in the future with finding out test results, communicating by private email, and scheduling acute appointments online when needed.  Please return in 6 months, or sooner if needed, with Lab testing done 3-5 days before

## 2014-09-07 NOTE — Assessment & Plan Note (Signed)
stable overall by history and exam, recent data reviewed with pt, and pt to continue medical treatment as before,  to f/u any worsening symptoms or concerns Lab Results  Component Value Date   HGBA1C 6.7* 09/06/2013

## 2014-09-07 NOTE — Progress Notes (Signed)
Subjective:    Patient ID: Connie Marshall, female    DOB: 1945/12/10, 68 y.o.   MRN: [id number]  HPI  Here for wellness and f/u;  Overall doing ok;  Pt denies CP, worsening SOB, DOE, wheezing, orthopnea, PND, worsening LE edema, palpitations, dizziness or syncope.  Pt denies neurological change such as new headache, facial or extremity weakness.  Pt denies polydipsia, polyuria, or low sugar symptoms. Pt states overall good compliance with treatment and medications, good tolerability, and has been trying to follow lower cholesterol diet.  Pt denies worsening depressive symptoms, suicidal ideation or panic. No fever, night sweats, wt loss, loss of appetite, or other constitutional symptoms.  Pt states good ability with ADL's, has low fall risk, home safety reviewed and adequate, no other significant changes in hearing or vision, and  active with exercise as she rides a stat bike 6 days per wk for 8 miles, and walks several miles per wk as well.  No other complaints Past Medical History  Diagnosis Date  . Hemorrhoids   . Diverticulosis   . IBS (irritable bowel syndrome)   . Obesity   . Hypertension   . Hyperlipidemia   . Diabetes mellitus     borderline/NO MEDS   Past Surgical History  Procedure Laterality Date  . Umbilical herniorrhapy    . Thyroglossal duct cyst  2000  . Cholecystectomy, laparoscopic    . Tonsillectomy  1979  . Cesarean section      2 times  . Cholecystectomy      laproscopic  . Kidney stone removed  2011  . Vaginal hysterectomy  2003    reports that she has never smoked. She has never used smokeless tobacco. She reports that she does not drink alcohol or use illicit drugs. family history includes Diabetes in her father; Hypertension in her father and mother; Parkinson's disease in her sister; Prostate cancer in her brother. Allergies  Allergen Reactions  . Tape     ALLERGIC TO NON-ALLERGIC TAPE-causes a rash   Current Outpatient Prescriptions on File Prior to  Visit  Medication Sig Dispense Refill  . fosinopril (MONOPRIL) 20 MG tablet Take 1 tablet (20 mg total) by mouth daily. 30 tablet 6  . furosemide (LASIX) 40 MG tablet take 1 tablet by mouth once daily 30 tablet 6  . 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 simvastatin (ZOCOR) 20 MG tablet take 1 tablet by mouth every evening 30 tablet 1   Current Facility-Administered Medications on File Prior to Visit  Medication Dose Route Frequency Provider Last Rate Last Dose  . pneumococcal 23 valent vaccine (PNU-IMMUNE) injection 0.5 mL  0.5 mL Intramuscular Once Connie Rhymes, MD        Review of Systems Constitutional: Negative for increased diaphoresis, other activity, appetite or other siginficant weight change  HENT: Negative for worsening hearing loss, ear pain, facial swelling, mouth sores and neck stiffness.   Eyes: Negative for other worsening pain, redness or visual disturbance.  Respiratory: Negative for shortness of breath and wheezing.   Cardiovascular: Negative for chest pain and palpitations.  Gastrointestinal: Negative for diarrhea, blood in stool, abdominal distention or other pain Genitourinary: Negative for hematuria, flank pain or change in urine volume.  Musculoskeletal: Negative for myalgias or other joint complaints.  Skin: Negative for color change and wound.  Neurological: Negative for syncope and numbness. other than noted Hematological: Negative for adenopathy. or other swelling Psychiatric/Behavioral: Negative for hallucinations, self-injury, decreased  concentration or other worsening agitation.      Objective:   Physical Exam BP 120/68 mmHg  Pulse 68  Temp(Src) 98 F (36.7 C) (Oral)  Ht 5\' 7"  (1.702 m)  Wt 251 lb 2 oz (113.91 kg)  BMI 39.32 kg/m2  SpO2 96% VS noted,  Constitutional: Pt is oriented to person, place, and time. Appears well-developed and well-nourished. Connie Marshall Head: Normocephalic and atraumatic.  Right Ear: External ear  normal.  Left Ear: External ear normal.  Nose: Nose normal.  Mouth/Throat: Oropharynx is clear and moist.  Eyes: Conjunctivae and EOM are normal. Pupils are equal, round, and reactive to light.  Neck: Normal range of motion. Neck supple. No JVD present. No tracheal deviation present.  Cardiovascular: Normal rate, regular rhythm, normal heart sounds and intact distal pulses.   Pulmonary/Chest: Effort normal and breath sounds without rales or wheezing  Abdominal: Soft. Bowel sounds are normal. NT. No HSM  Musculoskeletal: Normal range of motion. Exhibits no edema.  Lymphadenopathy:  Has no cervical adenopathy.  Neurological: Pt is alert and oriented to person, place, and time. Pt has normal reflexes. No cranial nerve deficit. Motor grossly intact Skin: Skin is warm and dry. No rash noted.  Psychiatric:  Has normal mood and affect. Behavior is normal.  Wt Readings from Last 3 Encounters:  09/07/14 251 lb 2 oz (113.91 kg)  06/10/14 271 lb (122.925 kg)  05/27/14 271 lb 3.2 oz (123.016 kg)       Assessment & Plan:

## 2014-09-07 NOTE — Assessment & Plan Note (Signed)

## 2014-10-12 ENCOUNTER — Other Ambulatory Visit: Payer: Self-pay | Admitting: Internal Medicine

## 2014-10-22 ENCOUNTER — Other Ambulatory Visit: Payer: Self-pay | Admitting: Internal Medicine

## 2015-03-14 ENCOUNTER — Telehealth: Payer: Self-pay | Admitting: Internal Medicine

## 2015-03-14 NOTE — Telephone Encounter (Signed)
Labs extended until 06/27

## 2015-03-14 NOTE — Telephone Encounter (Signed)
Patient will be going for labs on 04/10/2015. Please change the expiration date of standing lab orders which are currently set to expire on 04/05/2015

## 2015-04-06 ENCOUNTER — Other Ambulatory Visit (INDEPENDENT_AMBULATORY_CARE_PROVIDER_SITE_OTHER): Payer: Medicare Other

## 2015-04-06 DIAGNOSIS — E119 Type 2 diabetes mellitus without complications: Secondary | ICD-10-CM

## 2015-04-06 LAB — HEPATIC FUNCTION PANEL
ALT: 14 U/L (ref 0–35)
AST: 17 U/L (ref 0–37)
Albumin: 4.2 g/dL (ref 3.5–5.2)
Alkaline Phosphatase: 50 U/L (ref 39–117)
Bilirubin, Direct: 0.1 mg/dL (ref 0.0–0.3)
TOTAL PROTEIN: 6.9 g/dL (ref 6.0–8.3)
Total Bilirubin: 0.5 mg/dL (ref 0.2–1.2)

## 2015-04-06 LAB — BASIC METABOLIC PANEL
BUN: 15 mg/dL (ref 6–23)
CO2: 26 mEq/L (ref 19–32)
Calcium: 10 mg/dL (ref 8.4–10.5)
Chloride: 105 mEq/L (ref 96–112)
Creatinine, Ser: 0.93 mg/dL (ref 0.40–1.20)
GFR: 76.84 mL/min (ref >60.00)
GLUCOSE: 118 mg/dL — AB (ref 70–99)
Potassium: 4.3 mEq/L (ref 3.5–5.1)
Sodium: 137 mEq/L (ref 135–145)

## 2015-04-06 LAB — LIPID PANEL
CHOL/HDL RATIO: 3
Cholesterol: 120 mg/dL (ref 0–200)
HDL: 38.8 mg/dL — ABNORMAL LOW (ref >39.00)
LDL CALC: 64 mg/dL (ref 0–99)
NONHDL: 81.2
Triglycerides: 87 mg/dL (ref 0.0–149.0)
VLDL: 17.4 mg/dL (ref 0.0–40.0)

## 2015-04-06 LAB — HEMOGLOBIN A1C: Hgb A1c MFr Bld: 6.4 % (ref 4.6–6.5)

## 2015-04-13 ENCOUNTER — Encounter: Payer: Self-pay | Admitting: Internal Medicine

## 2015-04-13 ENCOUNTER — Ambulatory Visit (INDEPENDENT_AMBULATORY_CARE_PROVIDER_SITE_OTHER): Payer: Medicare Other | Admitting: Internal Medicine

## 2015-04-13 VITALS — BP 112/76 | HR 75 | Temp 97.6°F | Ht 67.0 in | Wt 255.0 lb

## 2015-04-13 DIAGNOSIS — E785 Hyperlipidemia, unspecified: Secondary | ICD-10-CM

## 2015-04-13 DIAGNOSIS — S90425A Blister (nonthermal), left lesser toe(s), initial encounter: Secondary | ICD-10-CM | POA: Diagnosis not present

## 2015-04-13 DIAGNOSIS — E119 Type 2 diabetes mellitus without complications: Secondary | ICD-10-CM

## 2015-04-13 DIAGNOSIS — Z0000 Encounter for general adult medical examination without abnormal findings: Secondary | ICD-10-CM

## 2015-04-13 DIAGNOSIS — N6452 Nipple discharge: Secondary | ICD-10-CM | POA: Insufficient documentation

## 2015-04-13 DIAGNOSIS — I10 Essential (primary) hypertension: Secondary | ICD-10-CM | POA: Diagnosis not present

## 2015-04-13 NOTE — Assessment & Plan Note (Signed)
stable overall by history and exam, recent data reviewed with pt, and pt to continue medical treatment as before,  to f/u any worsening symptoms or concerns BP Readings from Last 3 Encounters:  04/13/15 112/76  09/07/14 120/68  06/10/14 126/64

## 2015-04-13 NOTE — Assessment & Plan Note (Signed)
/  stable overall by history and exam, recent data reviewed with pt, and pt to continue medical treatment as before,  to f/u any worsening symptoms or concerns Lab Results  Component Value Date   HGBA1C 6.4 04/06/2015   For f/u lab next visit

## 2015-04-13 NOTE — Progress Notes (Signed)
Pre visit review using our clinic review tool, if applicable. No additional management support is needed unless otherwise documented below in the visit note. 

## 2015-04-13 NOTE — Patient Instructions (Signed)
Please continue all other medications as before, and refills have been done if requested.  Please have the pharmacy call with any other refills you may need.  Please continue your efforts at being more active, low cholesterol diet, and weight control.  You are otherwise up to date with prevention measures today.  Please keep your appointments with your specialists as you may have planned  OK to use bandaid with neosporin for the toe until healed  You will be contacted regarding the referral for: diagnostic mammogram for [facility] asap  Please return in 6 months, or sooner if needed, with Lab testing done 3-5 days before

## 2015-04-13 NOTE — Assessment & Plan Note (Signed)
Left only during and just after recent screen mammogram, neg per pt, but will need furhter imaging - will order diag mammogram per pt request for next wk

## 2015-04-13 NOTE — Assessment & Plan Note (Signed)
stable overall by history and exam, recent data reviewed with pt, and pt to continue medical treatment as before,  to f/u any worsening symptoms or concerns Lab Results  Component Value Date   LDLCALC 64 04/06/2015

## 2015-04-13 NOTE — Progress Notes (Signed)
Subjective:    Patient ID: Connie Marshall, female    DOB: 1946/08/25, 69 y.o.   MRN: [id number]  HPI  Here to f/u; overall doing ok,  Pt denies chest pain, increasing sob or doe, wheezing, orthopnea, PND, increased LE swelling, palpitations, dizziness or syncope.  Pt denies new neurological symptoms such as new headache, or facial or extremity weakness or numbness.  Pt denies polydipsia, polyuria, or low sugar episode.   Pt denies new neurological symptoms such as new headache, or facial or extremity weakness or numbness.   Pt states overall good compliance with meds, mostly trying to follow appropriate diet, with wt overall stable,  but little exercise however.  Also has blister to dorsal distal left second toe and popped it with a pin 2 days ago, but no worsening red/swelling/tender or hx of vascular insuff.  Also had screening mammogram and had an unusual left breast discharge watery nonbloody during and just after exam  - needs diag mammog order for further study next wk, No further breast pain, d/c, skin change or lump Past Medical History  Diagnosis Date  . Hemorrhoids   . Diverticulosis   . IBS (irritable bowel syndrome)   . Obesity   . Hypertension   . Hyperlipidemia   . Diabetes mellitus     borderline/NO MEDS   Past Surgical History  Procedure Laterality Date  . Umbilical herniorrhapy    . Thyroglossal duct cyst  2000  . Cholecystectomy, laparoscopic    . Tonsillectomy  1979  . Cesarean section      2 times  . Cholecystectomy      laproscopic  . Kidney stone removed  2011  . Vaginal hysterectomy  2003    reports that she has never smoked. She has never used smokeless tobacco. She reports that she does not drink alcohol or use illicit drugs. family history includes Diabetes in her father; Hypertension in her father and mother; Parkinson's disease in her sister; Prostate cancer in her brother. Allergies  Allergen Reactions  . Tape     ALLERGIC TO NON-ALLERGIC TAPE-causes  a rash   Current Outpatient Prescriptions on File Prior to Visit  Medication Sig Dispense Refill  . fosinopril (MONOPRIL) 20 MG tablet take 1 tablet by mouth once daily 30 tablet 11  . furosemide (LASIX) 40 MG tablet take 1 tablet by mouth once daily 30 tablet 11  . 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 simvastatin (ZOCOR) 20 MG tablet take 1 tablet by mouth every evening 30 tablet 11   Current Facility-Administered Medications on File Prior to Visit  Medication Dose Route Frequency Provider Last Rate Last Dose  . pneumococcal 23 valent vaccine (PNU-IMMUNE) injection 0.5 mL  0.5 mL Intramuscular Once Neena Rhymes, MD       Review of Systems  Constitutional: Negative for unusual diaphoresis or night sweats HENT: Negative for ringing in ear or discharge Eyes: Negative for double vision or worsening visual disturbance.  Respiratory: Negative for choking and stridor.   Gastrointestinal: Negative for vomiting or other signifcant bowel change Genitourinary: Negative for hematuria or change in urine volume.  Musculoskeletal: Negative for other MSK pain or swelling Skin: Negative for color change and worsening wound.  Neurological: Negative for tremors and numbness other than noted  Psychiatric/Behavioral: Negative for decreased concentration or agitation other than above       Objective:   Physical Exam BP 112/76 mmHg  Pulse 75  Temp(Src) 97.6  F (36.4 C) (Oral)  Ht 5\' 7"  (1.702 m)  Wt 255 lb (115.667 kg)  BMI 39.93 kg/m2  SpO2 96% VS noted,  Constitutional: Pt appears in no significant distress HENT: Head: NCAT.  Right Ear: External ear normal.  Left Ear: External ear normal.  Eyes: . Pupils are equal, round, and reactive to light. Conjunctivae and EOM are normal Neck: Normal range of motion. Neck supple.  Cardiovascular: Normal rate and regular rhythm.   Pulmonary/Chest: Effort normal and breath sounds without rales or wheezing.  Abd:  Soft, NT,  ND, + BS Neurological: Pt is alert. Not confused , motor grossly intact Skin: Skin is warm. No rash, no LE edema, left dorsal distal second toe with 1/3 cm partially denuded blister slightly raised wihtout evidence for red/swelling/ tender, drainage or red streaks, dorsalis pedis 1+ Left foot Psychiatric: Pt behavior is normal. No agitation.  Breasts: deferred    Assessment & Plan:

## 2015-04-13 NOTE — Assessment & Plan Note (Signed)
With small ulceration only few mm deep, for neosporin topical and cover until healed,  to f/u any worsening symptoms or concerns

## 2015-04-14 ENCOUNTER — Encounter: Payer: Self-pay | Admitting: Internal Medicine

## 2015-04-14 LAB — HM MAMMOGRAPHY

## 2015-04-19 ENCOUNTER — Telehealth: Payer: Self-pay

## 2015-04-19 DIAGNOSIS — N6452 Nipple discharge: Secondary | ICD-10-CM

## 2015-04-19 NOTE — Telephone Encounter (Signed)
Connie Marshall is requesting an order for LT breast US - Breast discharge

## 2015-04-19 NOTE — Telephone Encounter (Signed)
Done per emr, ok to let [facility];s know

## 2015-04-20 NOTE — Telephone Encounter (Signed)
Referral faxed to [facility].

## 2015-04-24 ENCOUNTER — Encounter: Payer: Self-pay | Admitting: Internal Medicine

## 2015-04-25 LAB — HM DIABETES EYE EXAM

## 2015-08-22 ENCOUNTER — Telehealth: Payer: Self-pay | Admitting: Internal Medicine

## 2015-08-22 DIAGNOSIS — Z0000 Encounter for general adult medical examination without abnormal findings: Secondary | ICD-10-CM

## 2015-08-22 NOTE — Telephone Encounter (Signed)
Notified patient.

## 2015-08-22 NOTE — Telephone Encounter (Signed)
Lab added

## 2015-08-22 NOTE — Telephone Encounter (Signed)
Patient is requesting to have hep C screening added to her lab orders.

## 2015-09-05 ENCOUNTER — Other Ambulatory Visit (INDEPENDENT_AMBULATORY_CARE_PROVIDER_SITE_OTHER): Payer: Medicare Other

## 2015-09-05 DIAGNOSIS — E785 Hyperlipidemia, unspecified: Secondary | ICD-10-CM

## 2015-09-05 DIAGNOSIS — Z0000 Encounter for general adult medical examination without abnormal findings: Secondary | ICD-10-CM

## 2015-09-05 DIAGNOSIS — E119 Type 2 diabetes mellitus without complications: Secondary | ICD-10-CM | POA: Diagnosis not present

## 2015-09-05 DIAGNOSIS — I10 Essential (primary) hypertension: Secondary | ICD-10-CM | POA: Diagnosis not present

## 2015-09-05 DIAGNOSIS — Z0189 Encounter for other specified special examinations: Secondary | ICD-10-CM

## 2015-09-05 LAB — HEPATIC FUNCTION PANEL
ALBUMIN: 4.3 g/dL (ref 3.5–5.2)
ALT: 16 U/L (ref 0–35)
AST: 21 U/L (ref 0–37)
Alkaline Phosphatase: 52 U/L (ref 39–117)
BILIRUBIN TOTAL: 0.7 mg/dL (ref 0.2–1.2)
Bilirubin, Direct: 0.2 mg/dL (ref 0.0–0.3)
Total Protein: 7.1 g/dL (ref 6.0–8.3)

## 2015-09-05 LAB — CBC WITH DIFFERENTIAL/PLATELET
BASOS PCT: 0.6 % (ref 0.0–3.0)
Basophils Absolute: 0 10*3/uL (ref 0.0–0.1)
EOS ABS: 0.1 10*3/uL (ref 0.0–0.7)
EOS PCT: 4.2 % (ref 0.0–5.0)
HEMATOCRIT: 41.3 % (ref 36.0–46.0)
HEMOGLOBIN: 13.6 g/dL (ref 12.0–15.0)
LYMPHS PCT: 44.8 % (ref 12.0–46.0)
Lymphs Abs: 1.5 10*3/uL (ref 0.7–4.0)
MCHC: 32.9 g/dL (ref 30.0–36.0)
MCV: 87.5 fl (ref 78.0–100.0)
Monocytes Absolute: 0.3 10*3/uL (ref 0.1–1.0)
Monocytes Relative: 10.1 % (ref 3.0–12.0)
Neutro Abs: 1.4 10*3/uL (ref 1.4–7.7)
Neutrophils Relative %: 40.3 % — ABNORMAL LOW (ref 43.0–77.0)
Platelets: 194 10*3/uL (ref 150.0–400.0)
RBC: 4.71 Mil/uL (ref 3.87–5.11)
RDW: 13.3 % (ref 11.5–15.5)
WBC: 3.4 10*3/uL — AB (ref 4.0–10.5)

## 2015-09-05 LAB — LIPID PANEL
Cholesterol: 115 mg/dL (ref 0–200)
HDL: 39.1 mg/dL (ref >39.00)
LDL CALC: 61 mg/dL (ref 0–99)
NonHDL: 75.44
TRIGLYCERIDES: 72 mg/dL (ref 0.0–149.0)
Total CHOL/HDL Ratio: 3
VLDL: 14.4 mg/dL (ref 0.0–40.0)

## 2015-09-05 LAB — BASIC METABOLIC PANEL
BUN: 11 mg/dL (ref 6–23)
CO2: 28 mEq/L (ref 19–32)
CREATININE: 0.76 mg/dL (ref 0.40–1.20)
Calcium: 9.8 mg/dL (ref 8.4–10.5)
Chloride: 104 mEq/L (ref 96–112)
GFR: 96.88 mL/min (ref >60.00)
GLUCOSE: 116 mg/dL — AB (ref 70–99)
POTASSIUM: 3.9 meq/L (ref 3.5–5.1)
Sodium: 139 mEq/L (ref 135–145)

## 2015-09-05 LAB — URINALYSIS, ROUTINE W REFLEX MICROSCOPIC
BILIRUBIN URINE: NEGATIVE
HGB URINE DIPSTICK: NEGATIVE
Ketones, ur: NEGATIVE
LEUKOCYTES UA: NEGATIVE
NITRITE: NEGATIVE
RBC / HPF: NONE SEEN (ref 0–?)
Specific Gravity, Urine: 1.02 (ref 1.000–1.030)
TOTAL PROTEIN, URINE-UPE24: NEGATIVE
URINE GLUCOSE: NEGATIVE
Urobilinogen, UA: 1 (ref 0.0–1.0)
pH: 6 (ref 5.0–8.0)

## 2015-09-05 LAB — HEMOGLOBIN A1C: Hgb A1c MFr Bld: 6.6 % — ABNORMAL HIGH (ref 4.6–6.5)

## 2015-09-05 LAB — TSH: TSH: 1.41 u[IU]/mL (ref 0.35–4.50)

## 2015-09-05 LAB — MICROALBUMIN / CREATININE URINE RATIO
CREATININE, U: 159.2 mg/dL
Microalb Creat Ratio: 0.4 mg/g (ref 0.0–30.0)
Microalb, Ur: 0.7 mg/dL (ref 0.0–1.9)

## 2015-09-06 LAB — HEPATITIS C ANTIBODY: HCV AB: NEGATIVE

## 2015-09-13 ENCOUNTER — Encounter: Payer: Self-pay | Admitting: Internal Medicine

## 2015-09-13 ENCOUNTER — Ambulatory Visit (INDEPENDENT_AMBULATORY_CARE_PROVIDER_SITE_OTHER): Payer: Medicare Other | Admitting: Internal Medicine

## 2015-09-13 VITALS — BP 126/76 | HR 65 | Temp 98.3°F | Ht 67.0 in | Wt 255.0 lb

## 2015-09-13 DIAGNOSIS — Z0000 Encounter for general adult medical examination without abnormal findings: Secondary | ICD-10-CM

## 2015-09-13 DIAGNOSIS — I10 Essential (primary) hypertension: Secondary | ICD-10-CM

## 2015-09-13 DIAGNOSIS — Z23 Encounter for immunization: Secondary | ICD-10-CM | POA: Diagnosis not present

## 2015-09-13 DIAGNOSIS — E119 Type 2 diabetes mellitus without complications: Secondary | ICD-10-CM

## 2015-09-13 DIAGNOSIS — R928 Other abnormal and inconclusive findings on diagnostic imaging of breast: Secondary | ICD-10-CM | POA: Diagnosis not present

## 2015-09-13 NOTE — Addendum Note (Signed)
Addended by: [name] on: 09/13/2015 12:50 PM   Modules accepted: Miquel Dunn

## 2015-09-13 NOTE — Progress Notes (Signed)
Pre visit review using our clinic review tool, if applicable. No additional management support is needed unless otherwise documented below in the visit note. 

## 2015-09-13 NOTE — Assessment & Plan Note (Addendum)

## 2015-09-13 NOTE — Assessment & Plan Note (Signed)
For f/u diag mammogram dec 28

## 2015-09-13 NOTE — Assessment & Plan Note (Signed)
stable overall by history and exam, recent data reviewed with pt, and pt to continue medical treatment as before,  to f/u any worsening symptoms or concerns BP Readings from Last 3 Encounters:  09/13/15 126/76  04/13/15 112/76  09/07/14 120/68

## 2015-09-13 NOTE — Patient Instructions (Addendum)
You had the flu shot today  You will be contacted regarding the referral for: diagnostic mammogram  Hour EKG and labs were OK today  Please continue all other medications as before, and refills have been done if requested.  Please have the pharmacy call with any other refills you may need.  Please continue your efforts at being more active, low cholesterol diet, and weight control.  You are otherwise up to date with prevention measures today.  Please keep your appointments with your specialists as you may have planned  Please return in 6 months, or sooner if needed, with Lab testing done 3-5 days before

## 2015-09-13 NOTE — Progress Notes (Signed)
Subjective:    Patient ID: Connie Marshall, female    DOB: 06/23/1946, 69 y.o.   MRN: [id number]  HPI  Here for wellness and f/u;  Overall doing ok;  Pt denies Chest pain, worsening SOB, DOE, wheezing, orthopnea, PND, worsening LE edema, palpitations, dizziness or syncope.  Pt denies neurological change such as new headache, facial or extremity weakness.  Pt denies polydipsia, polyuria, or low sugar symptoms. Pt states overall good compliance with treatment and medications, good tolerability, and has been trying to follow appropriate diet.  Pt denies worsening depressive symptoms, suicidal ideation or panic. No fever, night sweats, wt loss, loss of appetite, or other constitutional symptoms.  Pt states good ability with ADL's, has low fall risk, home safety reviewed and adequate, no other significant changes in hearing or vision, and only occasionally active with exercise. Rides stationary bike up 1 hour 6 days per wk, has some leg stiffness but no joint pain or falls Past Medical History  Diagnosis Date  . Hemorrhoids   . Diverticulosis   . IBS (irritable bowel syndrome)   . Obesity   . Hypertension   . Hyperlipidemia   . Diabetes mellitus     borderline/NO MEDS   Past Surgical History  Procedure Laterality Date  . Umbilical herniorrhapy    . Thyroglossal duct cyst  2000  . Cholecystectomy, laparoscopic    . Tonsillectomy  1979  . Cesarean section      2 times  . Cholecystectomy      laproscopic  . Kidney stone removed  2011  . Vaginal hysterectomy  2003    reports that she has never smoked. She has never used smokeless tobacco. She reports that she does not drink alcohol or use illicit drugs. family history includes Diabetes in her father; Hypertension in her father and mother; Parkinson's disease in her sister; Prostate cancer in her brother. Allergies  Allergen Reactions  . Tape     ALLERGIC TO NON-ALLERGIC TAPE-causes a rash   Current Outpatient Prescriptions on File Prior  to Visit  Medication Sig Dispense Refill  . fosinopril (MONOPRIL) 20 MG tablet take 1 tablet by mouth once daily 30 tablet 11  . furosemide (LASIX) 40 MG tablet take 1 tablet by mouth once daily 30 tablet 11  . 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 simvastatin (ZOCOR) 20 MG tablet take 1 tablet by mouth every evening 30 tablet 11   Current Facility-Administered Medications on File Prior to Visit  Medication Dose Route Frequency Provider Last Rate Last Dose  . pneumococcal 23 valent vaccine (PNU-IMMUNE) injection 0.5 mL  0.5 mL Intramuscular Once Neena Rhymes, MD       Review of Systems Constitutional: Negative for increased diaphoresis, other activity, appetite or siginficant weight change other than noted HENT: Negative for worsening hearing loss, ear pain, facial swelling, mouth sores and neck stiffness.   Eyes: Negative for other worsening pain, redness or visual disturbance.  Respiratory: Negative for shortness of breath and wheezing  Cardiovascular: Negative for chest pain and palpitations.  Gastrointestinal: Negative for diarrhea, blood in stool, abdominal distention or other pain Genitourinary: Negative for hematuria, flank pain or change in urine volume.  Musculoskeletal: Negative for myalgias or other joint complaints.  Skin: Negative for color change and wound or drainage.  Neurological: Negative for syncope and numbness. other than noted Hematological: Negative for adenopathy. or other swelling Psychiatric/Behavioral: Negative for hallucinations, SI, self-injury, decreased concentration or other worsening  agitation.      Objective:   Physical Exam BP 126/76 mmHg  Pulse 65  Temp(Src) 98.3 F (36.8 C) (Oral)  Ht 5\' 7"  (1.702 m)  Wt 255 lb (115.667 kg)  BMI 39.93 kg/m2  SpO2 95% VS noted, morbid obese Constitutional: Pt is oriented to person, place, and time. Appears well-developed and well-nourished, in no significant distress Head:  Normocephalic and atraumatic.  Right Ear: External ear normal.  Left Ear: External ear normal.  Nose: Nose normal.  Mouth/Throat: Oropharynx is clear and moist.  Eyes: Conjunctivae and EOM are normal. Pupils are equal, round, and reactive to light.  Neck: Normal range of motion. Neck supple. No JVD present. No tracheal deviation present or significant neck LA or mass Cardiovascular: Normal rate, regular rhythm, normal heart sounds and intact distal pulses.   Pulmonary/Chest: Effort normal and breath sounds without rales or wheezing  Abdominal: Soft. Bowel sounds are normal. NT. No HSM  Musculoskeletal: Normal range of motion. Exhibits no edema.  Lymphadenopathy:  Has no cervical adenopathy.  Neurological: Pt is alert and oriented to person, place, and time. Pt has normal reflexes. No cranial nerve deficit. Motor grossly intact Skin: Skin is warm and dry. No rash noted.  Psychiatric:  Has normal mood and affect. Behavior is normal.     Assessment & Plan:

## 2015-09-13 NOTE — Assessment & Plan Note (Signed)
stable overall by history and exam, recent data reviewed with pt, and pt to continue medical treatment as before,  to f/u any worsening symptoms or concerns Lab Results  Component Value Date   HGBA1C 6.6* 09/05/2015

## 2015-09-25 ENCOUNTER — Encounter: Payer: Self-pay | Admitting: Internal Medicine

## 2015-10-06 ENCOUNTER — Other Ambulatory Visit: Payer: Self-pay | Admitting: Internal Medicine

## 2015-10-18 ENCOUNTER — Other Ambulatory Visit: Payer: Self-pay | Admitting: Internal Medicine

## 2015-10-20 ENCOUNTER — Other Ambulatory Visit: Payer: Self-pay | Admitting: Internal Medicine

## 2015-10-24 ENCOUNTER — Encounter: Payer: Self-pay | Admitting: Internal Medicine

## 2015-11-03 ENCOUNTER — Encounter: Payer: Self-pay | Admitting: Internal Medicine

## 2015-11-07 ENCOUNTER — Ambulatory Visit (INDEPENDENT_AMBULATORY_CARE_PROVIDER_SITE_OTHER): Payer: Medicare Other | Admitting: Internal Medicine

## 2015-11-07 ENCOUNTER — Other Ambulatory Visit: Payer: Self-pay | Admitting: Internal Medicine

## 2015-11-07 ENCOUNTER — Encounter: Payer: Self-pay | Admitting: Internal Medicine

## 2015-11-07 VITALS — BP 140/84 | HR 89 | Temp 98.0°F | Resp 20 | Ht 67.0 in | Wt 257.0 lb

## 2015-11-07 DIAGNOSIS — R21 Rash and other nonspecific skin eruption: Secondary | ICD-10-CM | POA: Diagnosis not present

## 2015-11-07 MED ORDER — CLOTRIMAZOLE-BETAMETHASONE 1-0.05 % EX CREA: TOPICAL_CREAM | CUTANEOUS | Status: DC

## 2015-11-07 NOTE — Assessment & Plan Note (Signed)
D/w pt, c/w likely minor eczema, for lotrisone asd, already has appt with Dr Allyson Sabal for mar 2017 as well, pt is dissapointed today that I do not feel blood work is needed.

## 2015-11-07 NOTE — Progress Notes (Signed)
   Subjective:    Patient ID: Connie Marshall, female    DOB: [birth year], 70 y.o.   MRN: [id number]  HPI  Here to f/u, has now 3 small areas of rash to arms, very worried about a bacterial infection but no pain, fever, drainage or swelling.  Areas are scaly, nontender, was 2 areas last visit, now 3 areas and thinks she needs blood work for some kind of infection after reading on the internet.  Pt denies fever, wt loss, night sweats, loss of appetite, or other constitutional symptoms Past Medical History  Diagnosis Date  . Hemorrhoids   . Diverticulosis   . IBS (irritable bowel syndrome)   . Obesity   . Hypertension   . Hyperlipidemia   . Diabetes mellitus     borderline/NO MEDS   Past Surgical History  Procedure Laterality Date  . Umbilical herniorrhapy    . Thyroglossal duct cyst  2000  . Cholecystectomy, laparoscopic    . Tonsillectomy  1979  . Cesarean section      2 times  . Cholecystectomy      laproscopic  . Kidney stone removed  2011  . Vaginal hysterectomy  2003    reports that she has never smoked. She has never used smokeless tobacco. She reports that she does not drink alcohol or use illicit drugs. family history includes Diabetes in her father; Hypertension in her father and mother; Parkinson's disease in her sister; Prostate cancer in her brother. Allergies  Allergen Reactions  . Tape     ALLERGIC TO NON-ALLERGIC TAPE-causes a rash   Current Outpatient Prescriptions on File Prior to Visit  Medication Sig Dispense Refill  . fosinopril (MONOPRIL) 20 MG tablet take 1 tablet by mouth once daily 90 tablet 3  . furosemide (LASIX) 40 MG tablet take 1 tablet by mouth once daily 30 tablet 11  . 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 simvastatin (ZOCOR) 20 MG tablet take 1 tablet by mouth every evening 30 tablet 11   Current Facility-Administered Medications on File Prior to Visit  Medication Dose Route Frequency Provider Last Rate Last Dose  .  pneumococcal 23 valent vaccine (PNU-IMMUNE) injection 0.5 mL  0.5 mL Intramuscular Once Neena Rhymes, MD       Review of Systems All otherwise neg per pt     Objective:   Physical Exam BP 140/84 mmHg  Pulse 89  Temp(Src) 98 F (36.7 C) (Oral)  Resp 20  Ht 5\' 7"  (1.702 m)  Wt 257 lb (116.574 kg)  BMI 40.24 kg/m2  SpO2 97% VS noted,  Constitutional: Pt appears in no significant distress HENT: Head: NCAT.  Right Ear: External ear normal.  Left Ear: External ear normal.  Eyes: . Pupils are equal, round, and reactive to light. Conjunctivae and EOM are normal Neck: Normal range of motion. Neck supple.  Cardiovascular: Normal rate and regular rhythm.   Pulmonary/Chest: Effort normal and breath sounds without rales or wheezing.  Neurological: Pt is alert. Not confused , motor grossly intact Skin: Skin is warm. no LE edema but has 3 small oval up to 15 mm areas (2 to right foream, 1 left elbow) nonraised, nontender, nonerythem but scaly lesions Psychiatric: Pt behavior is normal. No agitation.      Assessment & Plan:

## 2015-11-07 NOTE — Patient Instructions (Addendum)
Please take all new medication as prescribed - the cream  Please continue all other medications as before, and refills have been done if requested.  Please have the pharmacy call with any other refills you may need.  Please continue your efforts at being more active, low cholesterol diet, and weight control.  Please keep your appointments with your specialists as you may have planned  You will be contacted regarding the referral for: dermatology - Dr Allyson Sabal

## 2015-11-07 NOTE — Progress Notes (Signed)
Pre visit review using our clinic review tool, if applicable. No additional management support is needed unless otherwise documented below in the visit note. 

## 2015-11-10 ENCOUNTER — Encounter: Payer: Self-pay | Admitting: Internal Medicine

## 2016-01-30 ENCOUNTER — Encounter: Payer: Self-pay | Admitting: Internal Medicine

## 2016-02-13 ENCOUNTER — Encounter: Payer: Self-pay | Admitting: Internal Medicine

## 2016-02-20 ENCOUNTER — Telehealth: Payer: Self-pay | Admitting: Internal Medicine

## 2016-02-20 NOTE — Telephone Encounter (Signed)
Patient scheduled CPE for December.  She is requesting labs to be entered for her to have done a week before.  Please follow up with patient in regards.

## 2016-02-20 NOTE — Telephone Encounter (Signed)
Labs placed for patient.

## 2016-04-05 LAB — HM DIABETES EYE EXAM

## 2016-04-12 ENCOUNTER — Encounter: Payer: Self-pay | Admitting: Internal Medicine

## 2016-05-08 ENCOUNTER — Encounter: Payer: Self-pay | Admitting: Internal Medicine

## 2016-05-09 ENCOUNTER — Telehealth: Payer: Self-pay

## 2016-05-09 MED ORDER — FUROSEMIDE 40 MG PO TABS: 40.0000 mg | ORAL_TABLET | Freq: Every day | ORAL | 5 refills | Status: DC

## 2016-05-09 NOTE — Telephone Encounter (Signed)
Medication refill sent to pharmacy  

## 2016-05-14 ENCOUNTER — Encounter: Payer: Self-pay | Admitting: Internal Medicine

## 2016-05-25 ENCOUNTER — Encounter (HOSPITAL_COMMUNITY): Payer: Self-pay | Admitting: Emergency Medicine

## 2016-05-25 ENCOUNTER — Emergency Department (HOSPITAL_COMMUNITY)
Admission: EM | Admit: 2016-05-25 | Discharge: 2016-05-25 | Disposition: A | Discharge status: Home / Self Care | Payer: Medicare Other | Attending: Emergency Medicine | Admitting: Emergency Medicine

## 2016-05-25 DIAGNOSIS — R040 Epistaxis: Secondary | ICD-10-CM | POA: Diagnosis present

## 2016-05-25 DIAGNOSIS — E119 Type 2 diabetes mellitus without complications: Secondary | ICD-10-CM | POA: Diagnosis not present

## 2016-05-25 DIAGNOSIS — Z79899 Other long term (current) drug therapy: Secondary | ICD-10-CM | POA: Insufficient documentation

## 2016-05-25 DIAGNOSIS — I10 Essential (primary) hypertension: Secondary | ICD-10-CM | POA: Diagnosis not present

## 2016-05-25 MED ORDER — LIDOCAINE HCL 4 % EX SOLN
Freq: Once | CUTANEOUS | Status: DC
Start: 2016-05-25 — End: 2016-05-25

## 2016-05-25 MED ORDER — SILVER NITRATE-POT NITRATE 75-25 % EX MISC
1.0000 | Freq: Once | CUTANEOUS | Status: DC
  Filled 2016-05-25: qty 1

## 2016-05-25 NOTE — ED Provider Notes (Signed)
[facility] DEPT Provider Note   CSN: [id number] Arrival date & time: 05/25/16  1230  First Provider Contact:  First MD Initiated Contact with Patient 05/25/16 1320        History   Chief Complaint Chief Complaint  Patient presents with  . Epistaxis    HPI Connie Marshall is a 70 y.o. female.   Epistaxis    Patient presents with nosebleed. Went for around 20 minutes today on her right nostril. Only came out anteriorly. No other bleeding sites today. States she has had 3 nosebleeds over the last few months. States she had a similar nosebleed on Wednesday. States that stopped after holding pressure. States she's been unable to get in to her ENT since then. No trauma. No lightheadedness or dizziness. She is not on anticoagulation.    Past Medical History:  Diagnosis Date  . Diabetes mellitus    borderline/NO MEDS  . Diverticulosis   . Hemorrhoids   . Hyperlipidemia   . Hypertension   . IBS (irritable bowel syndrome)   . Obesity     Patient Active Problem List   Diagnosis Date Noted  . Rash 11/07/2015  . Abnormal mammogram 09/13/2015  . Blister of toe of left foot 04/13/2015  . Breast discharge 04/13/2015  . Routine health maintenance 08/06/2011  . Microscopic hematuria 04/27/2010  . ANXIETY 03/25/2009  . PARESTHESIA 03/23/2009  . PERIPHERAL EDEMA 03/23/2009  . HEMORRHOIDS, INTERNAL 02/13/2008  . DIVERTICULOSIS, COLON 02/13/2008  . IBS 02/13/2008  . CHOLECYSTECTOMY, LAPAROSCOPIC, HX OF 02/13/2008  . OBESITY NOS 07/07/2007  . Diabetes ([place]) 07/06/2007  . Hyperlipidemia 07/06/2007  . Essential hypertension 07/06/2007    Past Surgical History:  Procedure Laterality Date  . CESAREAN SECTION     2 times  . CHOLECYSTECTOMY     laproscopic  . CHOLECYSTECTOMY, LAPAROSCOPIC    . kidney stone removed  2011  . THYROGLOSSAL DUCT CYST  2000  . TONSILLECTOMY  [id number]  . umbilical herniorrhapy    . VAGINAL HYSTERECTOMY  2003    OB History    No data available         Home Medications    Prior to Admission medications   Medication Sig Start Date End Date Taking? Authorizing Provider  clotrimazole-betamethasone (LOTRISONE) cream Use as directed 11/07/15   [name], MD  fosinopril (MONOPRIL) 20 MG tablet take 1 tablet by mouth once daily 10/06/15   [name], MD  furosemide (LASIX) 40 MG tablet Take 1 tablet (40 mg total) by mouth daily. 05/09/16   [name], MD  NON FORMULARY Bayer Back and Body extra strength-Take prn    Historical Provider, MD  simvastatin (ZOCOR) 20 MG tablet take 1 tablet by mouth every evening 10/20/15   [name], MD    Family History Family History  Problem Relation Age of Onset  . Hypertension Mother   . Diabetes Father   . Hypertension Father   . Parkinson's disease Sister   . Prostate cancer Brother     Social History Social History  Substance Use Topics  . Smoking status: Never Smoker  . Smokeless tobacco: Never Used  . Alcohol use No     Allergies   Tape   Review of Systems Review of Systems  Constitutional: Negative for appetite change.  HENT: Positive for nosebleeds.   Respiratory: Negative for shortness of breath.   Cardiovascular: Negative for chest pain.  Gastrointestinal: Negative for blood in stool.  Hematological: Does not bruise/bleed  easily.     Physical Exam Updated Vital Signs BP 157/84 (BP Location: Left Arm)   Pulse 74   Temp 98.4 F (36.9 C) (Oral)   Resp 18   Ht 5\' 9"  (1.753 m)   Wt 262 lb 6.4 oz (119 kg)   SpO2 95%   BMI 38.75 kg/m   Physical Exam  Constitutional: She appears well-developed.  HENT:  Head: Normocephalic.  Area of dried blood medially and anteriorly in right nostril. No active bleeding. No blood in posterior pharynx.  Eyes: Pupils are equal, round, and reactive to light.  Cardiovascular: Normal rate.   Skin: Skin is warm. Capillary refill takes less than 2 seconds.     ED Treatments / Results  Labs (all labs ordered are listed, but  only abnormal results are displayed) Labs Reviewed - No data to display  EKG  EKG Interpretation None       Radiology No results found.  Procedures Procedures (including critical care time)  Medications Ordered in ED Medications  lidocaine (XYLOCAINE) 4 % external solution (not administered)  silver nitrate applicators applicator 1 Stick (not administered)     Initial Impression / Assessment and Plan / ED Course  I have reviewed the triage vital signs and the nursing notes.  Pertinent labs & imaging results that were available during my care of the patient were reviewed by me and considered in my medical decision making (see chart for details).  Clinical Course    Patient with anterior epistaxis on right side. Silver nitrate applied and had mild recurrence of bleeding but did not even come out of her nose. Discharge home to follow-up with her ENT as planned.  Final Clinical Impressions(s) / ED Diagnoses   Final diagnoses:  None    New Prescriptions New Prescriptions   No medications on file     Davonna Belling, MD 05/25/16 1528

## 2016-05-25 NOTE — ED Triage Notes (Signed)
Patient here with right sided nosebleed that lasted about 40 minutes this am, states that she has had 3 episodes since January with right sided bleeding. On arrival no bleeding from nare, denies trauma, denies pain. Alert and oriented

## 2016-07-22 ENCOUNTER — Encounter: Payer: Self-pay | Admitting: Internal Medicine

## 2016-08-08 ENCOUNTER — Encounter: Payer: Self-pay | Admitting: Internal Medicine

## 2016-08-09 NOTE — Telephone Encounter (Signed)
[place] for office to contact pt, likely needs OV as I cant tell what kind of referral she needs from her description. thanks

## 2016-08-12 ENCOUNTER — Ambulatory Visit (INDEPENDENT_AMBULATORY_CARE_PROVIDER_SITE_OTHER): Payer: Medicare Other | Admitting: Nurse Practitioner

## 2016-08-12 ENCOUNTER — Encounter: Payer: Self-pay | Admitting: Nurse Practitioner

## 2016-08-12 ENCOUNTER — Other Ambulatory Visit: Payer: Self-pay | Admitting: *Deleted

## 2016-08-12 VITALS — BP 158/64 | HR 68 | Temp 98.3°F | Ht 67.0 in | Wt 266.0 lb

## 2016-08-12 DIAGNOSIS — Z23 Encounter for immunization: Secondary | ICD-10-CM

## 2016-08-12 DIAGNOSIS — N6489 Other specified disorders of breast: Secondary | ICD-10-CM

## 2016-08-12 DIAGNOSIS — S2000XA Contusion of breast, unspecified breast, initial encounter: Secondary | ICD-10-CM

## 2016-08-12 NOTE — Progress Notes (Signed)
Subjective:  Patient ID: Connie Marshall, female    DOB: [birth year]  Age: 70 y.o. MRN: [id number]  CC: Breast Problem (Pt stated Rt breast have redness)   HPI Connie Marshall presents for evaluation of right breast. She noticed a red spot on breast earlier this week. Does not remember injury to chest wall, denies any pain or nipple drainage, or mass.  Outpatient Medications Prior to Visit  Medication Sig Dispense Refill  . clotrimazole-betamethasone (LOTRISONE) cream Use as directed 15 g 1  . fosinopril (MONOPRIL) 20 MG tablet take 1 tablet by mouth once daily 90 tablet 3  . furosemide (LASIX) 40 MG tablet Take 1 tablet (40 mg total) by mouth daily. 30 tablet 5  . halobetasol (ULTRAVATE) 0.05 % ointment     . 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 simvastatin (ZOCOR) 20 MG tablet take 1 tablet by mouth every evening 30 tablet 11  . flurandrenolide (CORDRAN) 0.05 % lotion      Facility-Administered Medications Prior to Visit  Medication Dose Route Frequency Provider Last Rate Last Dose  . pneumococcal 23 valent vaccine (PNU-IMMUNE) injection 0.5 mL  0.5 mL Intramuscular Once Neena Rhymes, MD        ROS See HPI  Objective:  BP (!) 158/64 (BP Location: Left Arm, Patient Position: Sitting, Cuff Size: Large)   Pulse 68   Temp 98.3 F (36.8 C)   Ht 5\' 7"  (1.702 m)   Wt 266 lb (120.7 kg)   SpO2 98%   BMI 41.66 kg/m   BP Readings from Last 3 Encounters:  08/12/16 (!) 158/64  05/25/16 157/84  11/07/15 140/84    Wt Readings from Last 3 Encounters:  08/12/16 266 lb (120.7 kg)  05/25/16 262 lb 6.4 oz (119 kg)  11/07/15 257 lb (116.6 kg)    Physical Exam  Constitutional: She is oriented to person, place, and time. No distress.  Neck: Normal range of motion. Neck supple.  Cardiovascular: Normal rate.   Pulmonary/Chest: Effort normal. She exhibits no mass, no tenderness, no edema and no swelling. Right breast exhibits no inverted nipple, no mass, no  nipple discharge, no skin change and no tenderness. Left breast exhibits no inverted nipple, no mass, no nipple discharge, no skin change and no tenderness. Breasts are symmetrical.    Neurological: She is alert and oriented to person, place, and time.  Skin: Skin is warm and dry. No rash noted. No erythema.  Psychiatric: She has a normal mood and affect. Her behavior is normal.  Vitals reviewed.   Lab Results  Component Value Date   WBC 3.4 (L) 09/05/2015   HGB 13.6 09/05/2015   HCT 41.3 09/05/2015   PLT 194.0 09/05/2015   GLUCOSE 116 (H) 09/05/2015   CHOL 115 09/05/2015   TRIG 72.0 09/05/2015   HDL 39.10 09/05/2015   LDLDIRECT 115.2 09/26/2008   LDLCALC 61 09/05/2015   ALT 16 09/05/2015   AST 21 09/05/2015   NA 139 09/05/2015   K 3.9 09/05/2015   CL 104 09/05/2015   CREATININE 0.76 09/05/2015   BUN 11 09/05/2015   CO2 28 09/05/2015   TSH 1.41 09/05/2015   HGBA1C 6.6 (H) 09/05/2015   MICROALBUR 0.7 09/05/2015    No results found.  Assessment & Plan:   Connie Marshall was seen today for breast problem.  Diagnoses and all orders for this visit:  Bruise of breast   I am having Connie Marshall maintain her NON FORMULARY, fosinopril,  simvastatin, clotrimazole-betamethasone, furosemide, flurandrenolide, and halobetasol. We will continue to administer pneumococcal 23 valent vaccine.  No orders of the defined types were placed in this encounter.   Follow-up: Return if symptoms worsen or fail to improve.  Wilfred Lacy, NP

## 2016-08-12 NOTE — Patient Instructions (Signed)
Reassured patient about bruise on breast. Bruise will heal with need for further intervention.  Return to office if develops increased warmth, pain, worsening redness, or nodule.

## 2016-08-12 NOTE — Progress Notes (Signed)
Pre visit review using our clinic review tool, if applicable. No additional management support is needed unless otherwise documented below in the visit note. 

## 2016-09-10 ENCOUNTER — Encounter: Payer: Self-pay | Admitting: Internal Medicine

## 2016-09-10 DIAGNOSIS — Z0001 Encounter for general adult medical examination with abnormal findings: Secondary | ICD-10-CM

## 2016-09-18 ENCOUNTER — Other Ambulatory Visit (INDEPENDENT_AMBULATORY_CARE_PROVIDER_SITE_OTHER): Payer: Medicare Other

## 2016-09-18 DIAGNOSIS — Z0001 Encounter for general adult medical examination with abnormal findings: Secondary | ICD-10-CM

## 2016-09-18 LAB — CBC WITH DIFFERENTIAL/PLATELET
BASOS PCT: 0.9 % (ref 0.0–3.0)
Basophils Absolute: 0 10*3/uL (ref 0.0–0.1)
EOS PCT: 5.5 % — AB (ref 0.0–5.0)
Eosinophils Absolute: 0.2 10*3/uL (ref 0.0–0.7)
HCT: 40 % (ref 36.0–46.0)
Hemoglobin: 13.8 g/dL (ref 12.0–15.0)
LYMPHS ABS: 1.7 10*3/uL (ref 0.7–4.0)
Lymphocytes Relative: 44 % (ref 12.0–46.0)
MCHC: 34.5 g/dL (ref 30.0–36.0)
MCV: 85.8 fl (ref 78.0–100.0)
MONOS PCT: 9.6 % (ref 3.0–12.0)
Monocytes Absolute: 0.4 10*3/uL (ref 0.1–1.0)
NEUTROS ABS: 1.5 10*3/uL (ref 1.4–7.7)
NEUTROS PCT: 40 % — AB (ref 43.0–77.0)
PLATELETS: 223 10*3/uL (ref 150.0–400.0)
RBC: 4.66 Mil/uL (ref 3.87–5.11)
RDW: 12.8 % (ref 11.5–15.5)
WBC: 3.8 10*3/uL — ABNORMAL LOW (ref 4.0–10.5)

## 2016-09-18 LAB — URINALYSIS, ROUTINE W REFLEX MICROSCOPIC
BILIRUBIN URINE: NEGATIVE
Hgb urine dipstick: NEGATIVE
Ketones, ur: NEGATIVE
LEUKOCYTES UA: NEGATIVE
Nitrite: NEGATIVE
PH: 6.5 (ref 5.0–8.0)
RBC / HPF: NONE SEEN (ref 0–?)
SPECIFIC GRAVITY, URINE: 1.01 (ref 1.000–1.030)
Total Protein, Urine: NEGATIVE
Urine Glucose: NEGATIVE
Urobilinogen, UA: 0.2 (ref 0.0–1.0)
WBC, UA: NONE SEEN (ref 0–?)

## 2016-09-18 LAB — LIPID PANEL
Cholesterol: 115 mg/dL (ref 0–200)
HDL: 41.9 mg/dL (ref >39.00)
LDL CALC: 60 mg/dL (ref 0–99)
NONHDL: 73.32
Total CHOL/HDL Ratio: 3
Triglycerides: 68 mg/dL (ref 0.0–149.0)
VLDL: 13.6 mg/dL (ref 0.0–40.0)

## 2016-09-18 LAB — BASIC METABOLIC PANEL
BUN: 21 mg/dL (ref 6–23)
CALCIUM: 10 mg/dL (ref 8.4–10.5)
CO2: 28 mEq/L (ref 19–32)
Chloride: 105 mEq/L (ref 96–112)
Creatinine, Ser: 0.89 mg/dL (ref 0.40–1.20)
GFR: 80.5 mL/min (ref >60.00)
Glucose, Bld: 128 mg/dL — ABNORMAL HIGH (ref 70–99)
POTASSIUM: 4.7 meq/L (ref 3.5–5.1)
SODIUM: 140 meq/L (ref 135–145)

## 2016-09-18 LAB — HEPATIC FUNCTION PANEL
ALK PHOS: 49 U/L (ref 39–117)
ALT: 17 U/L (ref 0–35)
AST: 19 U/L (ref 0–37)
Albumin: 4.3 g/dL (ref 3.5–5.2)
BILIRUBIN DIRECT: 0.1 mg/dL (ref 0.0–0.3)
Total Bilirubin: 0.5 mg/dL (ref 0.2–1.2)
Total Protein: 7.1 g/dL (ref 6.0–8.3)

## 2016-09-18 LAB — TSH: TSH: 1.17 u[IU]/mL (ref 0.35–4.50)

## 2016-09-20 ENCOUNTER — Encounter: Payer: Self-pay | Admitting: Internal Medicine

## 2016-09-20 ENCOUNTER — Ambulatory Visit (INDEPENDENT_AMBULATORY_CARE_PROVIDER_SITE_OTHER): Payer: Medicare Other | Admitting: Internal Medicine

## 2016-09-20 VITALS — BP 130/74 | HR 76 | Temp 98.4°F | Resp 20 | Wt 256.0 lb

## 2016-09-20 DIAGNOSIS — Z0001 Encounter for general adult medical examination with abnormal findings: Secondary | ICD-10-CM | POA: Diagnosis not present

## 2016-09-20 DIAGNOSIS — I10 Essential (primary) hypertension: Secondary | ICD-10-CM

## 2016-09-20 DIAGNOSIS — E119 Type 2 diabetes mellitus without complications: Secondary | ICD-10-CM

## 2016-09-20 DIAGNOSIS — Z780 Asymptomatic menopausal state: Secondary | ICD-10-CM

## 2016-09-20 DIAGNOSIS — E785 Hyperlipidemia, unspecified: Secondary | ICD-10-CM | POA: Diagnosis not present

## 2016-09-20 NOTE — Progress Notes (Signed)
Subjective:    Patient ID: Connie Marshall, female    DOB: 11/06/1945, 70 y.o.   MRN: [id number]  HPI  Here for wellness and f/u;  Overall doing ok;  Pt denies Chest pain, worsening SOB, DOE, wheezing, orthopnea, PND, worsening LE edema, palpitations, dizziness or syncope.  Pt denies neurological change such as new headache, facial or extremity weakness.  Pt denies polydipsia, polyuria, or low sugar symptoms. Pt states overall good compliance with treatment and medications, good tolerability, and has been trying to follow appropriate diet.  Pt denies worsening depressive symptoms, suicidal ideation or panic. No fever, night sweats, wt loss, loss of appetite, or other constitutional symptoms.  Pt states good ability with ADL's, has low fall risk, home safety reviewed and adequate, no other significant changes in hearing or vision, and only occasionally active with exercise.  Lost 10 lbs with better diet, and going to gym 5-6 times per wk Wt Readings from Last 3 Encounters:  09/20/16 256 lb (116.1 kg)  08/12/16 266 lb (120.7 kg)  05/25/16 262 lb 6.4 oz (119 kg)   BP Readings from Last 3 Encounters:  09/20/16 130/74  08/12/16 (!) 158/64  05/25/16 157/84  Had nose bleed on right side, seen recently at ED after 30 min bleeding, stopped with cautery, advised to use neosporin topical daily . No recurrence No other hx findings Past Medical History:  Diagnosis Date  . Diabetes mellitus    borderline/NO MEDS  . Diverticulosis   . Hemorrhoids   . Hyperlipidemia   . Hypertension   . IBS (irritable bowel syndrome)   . Obesity    Past Surgical History:  Procedure Laterality Date  . CESAREAN SECTION     2 times  . CHOLECYSTECTOMY     laproscopic  . CHOLECYSTECTOMY, LAPAROSCOPIC    . kidney stone removed  2011  . THYROGLOSSAL DUCT CYST  2000  . TONSILLECTOMY  [id number]  . umbilical herniorrhapy    . VAGINAL HYSTERECTOMY  2003    reports that she has never smoked. She has never used smokeless  tobacco. She reports that she does not drink alcohol or use drugs. family history includes Diabetes in her father; Hypertension in her father and mother; Parkinson's disease in her sister; Prostate cancer in her brother. Allergies  Allergen Reactions  . Tape     ALLERGIC TO NON-ALLERGIC TAPE-causes a rash   Current Outpatient Prescriptions on File Prior to Visit  Medication Sig Dispense Refill  . clotrimazole-betamethasone (LOTRISONE) cream Use as directed 15 g 1  . flurandrenolide (CORDRAN) 0.05 % lotion     . fosinopril (MONOPRIL) 20 MG tablet take 1 tablet by mouth once daily 90 tablet 3  . furosemide (LASIX) 40 MG tablet Take 1 tablet (40 mg total) by mouth daily. 30 tablet 5  . halobetasol (ULTRAVATE) 0.05 % ointment     . 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 simvastatin (ZOCOR) 20 MG tablet take 1 tablet by mouth every evening 30 tablet 11   Current Facility-Administered Medications on File Prior to Visit  Medication Dose Route Frequency Provider Last Rate Last Dose  . pneumococcal 23 valent vaccine (PNU-IMMUNE) injection 0.5 mL  0.5 mL Intramuscular Once Neena Rhymes, MD       Review of Systems Constitutional: Negative for increased diaphoresis, or other activity, appetite or siginficant weight change other than noted HENT: Negative for worsening hearing loss, ear pain, facial swelling, mouth sores and neck stiffness.  Eyes: Negative for other worsening pain, redness or visual disturbance.  Respiratory: Negative for choking or stridor Cardiovascular: Negative for other chest pain and palpitations.  Gastrointestinal: Negative for worsening diarrhea, blood in stool, or abdominal distention Genitourinary: Negative for hematuria, flank pain or change in urine volume.  Musculoskeletal: Negative for myalgias or other joint complaints.  Skin: Negative for other color change and wound or drainage.  Neurological: Negative for syncope and numbness. other  than noted Hematological: Negative for adenopathy. or other swelling Psychiatric/Behavioral: Negative for hallucinations, SI, self-injury, decreased concentration or other worsening agitation.  All other system neg per pt    Objective:   Physical Exam BP 130/74   Pulse 76   Temp 98.4 F (36.9 C) (Oral)   Resp 20   Wt 256 lb (116.1 kg)   SpO2 96%   BMI 40.10 kg/m  VS noted, not ill appearing Constitutional: Pt is oriented to person, place, and time. Appears well-developed and well-nourished, in no significant distress Head: Normocephalic and atraumatic  Eyes: Conjunctivae and EOM are normal. Pupils are equal, round, and reactive to light Right Ear: External ear normal.  Left Ear: External ear normal Nose: Nose normal.  Mouth/Throat: Oropharynx is clear and moist  Neck: Normal range of motion. Neck supple. No JVD present. No tracheal deviation present or significant neck LA or mass Cardiovascular: Normal rate, regular rhythm, normal heart sounds and intact distal pulses.   Pulmonary/Chest: Effort normal and breath sounds without rales or wheezing  Abdominal: Soft. Bowel sounds are normal. NT. No HSM  Musculoskeletal: Normal range of motion. Exhibits no edema Lymphadenopathy: Has no cervical adenopathy.  Neurological: Pt is alert and oriented to person, place, and time. Pt has normal reflexes. No cranial nerve deficit. Motor grossly intact Skin: Skin is warm and dry. No rash noted or new ulcers Psychiatric:  Has normal mood and affect. Behavior is normal.  No other new exam findings  POCT - hgba1c - 6.6     Assessment & Plan:

## 2016-09-20 NOTE — Progress Notes (Signed)
Pre visit review using our clinic review tool, if applicable. No additional management support is needed unless otherwise documented below in the visit note. 

## 2016-09-20 NOTE — Patient Instructions (Addendum)
Please continue all other medications as before, and refills have been done if requested.  Please have the pharmacy call with any other refills you may need.  Please continue your efforts at being more active, low cholesterol diet, and weight control.  You are otherwise up to date with prevention measures today.  Please keep your appointments with your specialists as you may have planned  Please schedule the bone density test before leaving today at the scheduling desk (where you check out)  Please return in 6 months, or sooner if needed, with Lab testing done 3-5 days before

## 2016-09-21 NOTE — Assessment & Plan Note (Signed)
stable overall by history and exam, recent data reviewed with pt, and pt to continue medical treatment as before,  to f/u any worsening symptoms or concerns Lab Results  Component Value Date   HGBA1C 6.6 (H) 09/05/2015

## 2016-09-21 NOTE — Assessment & Plan Note (Signed)

## 2016-09-21 NOTE — Assessment & Plan Note (Signed)
stable overall by history and exam, recent data reviewed with pt, and pt to continue medical treatment as before,  to f/u any worsening symptoms or concerns Lab Results  Component Value Date   LDLCALC 60 09/18/2016

## 2016-09-21 NOTE — Assessment & Plan Note (Signed)
stable overall by history and exam, recent data reviewed with pt, and pt to continue medical treatment as before,  to f/u any worsening symptoms or concerns BP Readings from Last 3 Encounters:  09/20/16 130/74  08/12/16 (!) 158/64  05/25/16 157/84

## 2016-10-01 ENCOUNTER — Other Ambulatory Visit: Payer: Self-pay | Admitting: Internal Medicine

## 2016-10-09 ENCOUNTER — Ambulatory Visit (INDEPENDENT_AMBULATORY_CARE_PROVIDER_SITE_OTHER)
Admission: RE | Admit: 2016-10-09 | Discharge: 2016-10-09 | Disposition: A | Discharge status: Home / Self Care | Payer: Medicare Other | Source: Ambulatory Visit | Attending: Internal Medicine | Admitting: Internal Medicine

## 2016-10-09 DIAGNOSIS — Z780 Asymptomatic menopausal state: Secondary | ICD-10-CM

## 2016-10-15 ENCOUNTER — Encounter: Payer: Self-pay | Admitting: Internal Medicine

## 2016-11-13 ENCOUNTER — Other Ambulatory Visit: Payer: Self-pay | Admitting: Internal Medicine

## 2016-12-14 ENCOUNTER — Other Ambulatory Visit: Payer: Self-pay | Admitting: Internal Medicine

## 2017-03-20 ENCOUNTER — Other Ambulatory Visit (INDEPENDENT_AMBULATORY_CARE_PROVIDER_SITE_OTHER): Payer: Medicare Other

## 2017-03-20 DIAGNOSIS — E119 Type 2 diabetes mellitus without complications: Secondary | ICD-10-CM | POA: Diagnosis not present

## 2017-03-20 LAB — BASIC METABOLIC PANEL
BUN: 16 mg/dL (ref 6–23)
CALCIUM: 9.7 mg/dL (ref 8.4–10.5)
CO2: 26 mEq/L (ref 19–32)
Chloride: 105 mEq/L (ref 96–112)
Creatinine, Ser: 0.95 mg/dL (ref 0.40–1.20)
GFR: 74.55 mL/min (ref >60.00)
GLUCOSE: 140 mg/dL — AB (ref 70–99)
Potassium: 4.2 mEq/L (ref 3.5–5.1)
Sodium: 139 mEq/L (ref 135–145)

## 2017-03-20 LAB — LIPID PANEL
CHOL/HDL RATIO: 3
CHOLESTEROL: 116 mg/dL (ref 0–200)
HDL: 35.4 mg/dL — ABNORMAL LOW (ref >39.00)
LDL Cholesterol: 58 mg/dL (ref 0–99)
NONHDL: 80.11
Triglycerides: 109 mg/dL (ref 0.0–149.0)
VLDL: 21.8 mg/dL (ref 0.0–40.0)

## 2017-03-20 LAB — HEPATIC FUNCTION PANEL
ALBUMIN: 4.3 g/dL (ref 3.5–5.2)
ALK PHOS: 49 U/L (ref 39–117)
ALT: 12 U/L (ref 0–35)
AST: 14 U/L (ref 0–37)
BILIRUBIN DIRECT: 0.1 mg/dL (ref 0.0–0.3)
BILIRUBIN TOTAL: 0.5 mg/dL (ref 0.2–1.2)
Total Protein: 6.9 g/dL (ref 6.0–8.3)

## 2017-03-20 LAB — HEMOGLOBIN A1C: Hgb A1c MFr Bld: 7.2 % — ABNORMAL HIGH (ref 4.6–6.5)

## 2017-03-21 ENCOUNTER — Ambulatory Visit: Payer: Medicare Other | Admitting: Internal Medicine

## 2017-03-25 ENCOUNTER — Ambulatory Visit (INDEPENDENT_AMBULATORY_CARE_PROVIDER_SITE_OTHER): Payer: Medicare Other | Admitting: Internal Medicine

## 2017-03-25 ENCOUNTER — Encounter: Payer: Self-pay | Admitting: Internal Medicine

## 2017-03-25 VITALS — BP 118/78 | HR 82 | Ht 67.0 in | Wt 258.0 lb

## 2017-03-25 DIAGNOSIS — H6692 Otitis media, unspecified, left ear: Secondary | ICD-10-CM | POA: Diagnosis not present

## 2017-03-25 DIAGNOSIS — N8110 Cystocele, unspecified: Secondary | ICD-10-CM

## 2017-03-25 DIAGNOSIS — E785 Hyperlipidemia, unspecified: Secondary | ICD-10-CM

## 2017-03-25 DIAGNOSIS — H9121 Sudden idiopathic hearing loss, right ear: Secondary | ICD-10-CM

## 2017-03-25 DIAGNOSIS — I10 Essential (primary) hypertension: Secondary | ICD-10-CM

## 2017-03-25 DIAGNOSIS — E119 Type 2 diabetes mellitus without complications: Secondary | ICD-10-CM

## 2017-03-25 DIAGNOSIS — Z0000 Encounter for general adult medical examination without abnormal findings: Secondary | ICD-10-CM

## 2017-03-25 MED ORDER — AMOXICILLIN 500 MG PO CAPS: 1000.0000 mg | ORAL_CAPSULE | Freq: Two times a day (BID) | ORAL | 0 refills | Status: AC

## 2017-03-25 NOTE — Progress Notes (Signed)
Subjective:    Patient ID: Connie Marshall, female    DOB: [birth year], 71 y.o.   MRN: [id number]  HPI  Here to f/u; overall doing ok,  Pt denies chest pain, increasing sob or doe, wheezing, orthopnea, PND, increased LE swelling, palpitations, dizziness or syncope.  Pt denies new neurological symptoms such as new headache, or facial or extremity weakness or numbness.  Pt denies polydipsia, polyuria, or low sugar episode.   Pt denies new neurological symptoms such as new headache, or facial or extremity weakness or numbness.   Pt states overall good compliance with meds, mostly trying to follow appropriate diet, with wt overall stable,  but little exercise however, and as overall been less active than usual recently.  Wt Readings from Last 3 Encounters:  03/25/17 258 lb (117 kg)  09/20/16 256 lb (116.1 kg)  08/12/16 266 lb (120.7 kg)  Also with c/o left ear pain and mild right hearing loss with sinus congestion, feverish, general weakness and malaise. Also c/o prolapsed bladder causing pressure, s/o TAH Past Medical History:  Diagnosis Date  . Diabetes mellitus    borderline/NO MEDS  . Diverticulosis   . Hemorrhoids   . Hyperlipidemia   . Hypertension   . IBS (irritable bowel syndrome)   . Obesity    Past Surgical History:  Procedure Laterality Date  . CESAREAN SECTION     2 times  . CHOLECYSTECTOMY     laproscopic  . CHOLECYSTECTOMY, LAPAROSCOPIC    . kidney stone removed  2011  . THYROGLOSSAL DUCT CYST  2000  . TONSILLECTOMY  1287  . umbilical herniorrhapy    . VAGINAL HYSTERECTOMY  2003    reports that she has never smoked. She has never used smokeless tobacco. She reports that she does not drink alcohol or use drugs. family history includes Diabetes in her father; Hypertension in her father and mother; Parkinson's disease in her sister; Prostate cancer in her brother. Allergies  Allergen Reactions  . Tape     ALLERGIC TO NON-ALLERGIC TAPE-causes a rash   Current  Outpatient Prescriptions on File Prior to Visit  Medication Sig Dispense Refill  . clotrimazole-betamethasone (LOTRISONE) cream Use as directed 15 g 1  . flurandrenolide (CORDRAN) 0.05 % lotion     . fosinopril (MONOPRIL) 20 MG tablet take 1 tablet by mouth once daily 90 tablet 3  . furosemide (LASIX) 40 MG tablet take 1 tablet by mouth once daily 30 tablet 5  . halobetasol (ULTRAVATE) 0.05 % ointment     . 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 simvastatin (ZOCOR) 20 MG tablet take 1 tablet by mouth every evening 30 tablet 11   Current Facility-Administered Medications on File Prior to Visit  Medication Dose Route Frequency Provider Last Rate Last Dose  . pneumococcal 23 valent vaccine (PNU-IMMUNE) injection 0.5 mL  0.5 mL Intramuscular Once Norins, Heinz Knuckles, MD        Review of Systems Constitutional: Negative for other unusual diaphoresis, sweats, appetite or weight changes HENT: Negative for other worsening hearing loss, ear pain, facial swelling, mouth sores or neck stiffness.   Eyes: Negative for other worsening pain, redness or other visual disturbance.  Respiratory: Negative for other stridor or swelling Cardiovascular: Negative for other palpitations or other chest pain  Gastrointestinal: Negative for worsening diarrhea or loose stools, blood in stool, distention or other pain Genitourinary: Negative for hematuria, flank pain or other change in urine volume.  Musculoskeletal: Negative  for myalgias or other joint swelling.  Skin: Negative for other color change, or other wound or worsening drainage.  Neurological: Negative for other syncope or numbness. Hematological: Negative for other adenopathy or swelling Psychiatric/Behavioral: Negative for hallucinations, other worsening agitation, SI, self-injury, or new decreased concentration All other system neg per pt    Objective:   Physical Exam BP 118/78   Pulse 82   Ht 5\' 7"  (1.702 m)   Wt 258 lb (117  kg)   SpO2 99%   BMI 40.41 kg/m  VS noted,  Constitutional: Pt is oriented to person, place, and time. Appears well-developed and well-nourished, in no significant distress and comfortable Head: Normocephalic and atraumatic  Eyes: Conjunctivae and EOM are normal. Pupils are equal, round, and reactive to light Right Ear: External ear normal without discharge; right canal wax impaction resolved with irrigation Left Ear: External ear normal without discharge; left TM with severe erythema, bulging Nose: Nose without discharge or deformity Mouth/Throat: Oropharynx is without other ulcerations and moist  Neck: Normal range of motion. Neck supple. No JVD present. No tracheal deviation present or significant neck LA or mass Cardiovascular: Normal rate, regular rhythm, normal heart sounds and intact distal pulses.   Pulmonary/Chest: WOB normal and breath sounds without rales or wheezing  Abdominal: Soft. Bowel sounds are normal. NT. No HSM  Musculoskeletal: Normal range of motion. Exhibits no edema Lymphadenopathy: Has no other cervical adenopathy.  Neurological: Pt is alert and oriented to person, place, and time. Pt has normal reflexes. No cranial nerve deficit. Motor grossly intact, Gait intact Skin: Skin is warm and dry. No rash noted or new ulcerations Psychiatric:  Has normal mood and affect. Behavior is normal without agitation No other exam findings    Assessment & Plan:

## 2017-03-25 NOTE — Patient Instructions (Addendum)
You right ear was irrigated of wax today  Please take all new medication as prescribed - the antibiotic  Please continue all other medications as before, and refills have been done if requested.  Please have the pharmacy call with any other refills you may need.  Please continue your efforts at being more active, low cholesterol diet, and weight control.  You will be contacted regarding the referral for: Urology  Please keep your appointments with your specialists as you may have planned  Please return in 6 months, or sooner if needed, with Lab testing done 3-5 days before

## 2017-03-26 ENCOUNTER — Telehealth: Payer: Self-pay | Admitting: Internal Medicine

## 2017-03-26 NOTE — Telephone Encounter (Signed)
Pt would like to know if she is allowed to take an extra strength aleve for her back pain while taking amoxicillin (AMOXIL) 500 MG capsule.  Please advise

## 2017-03-27 NOTE — Telephone Encounter (Signed)
Yes, that would be fine, thanks!

## 2017-03-27 NOTE — Telephone Encounter (Addendum)
Dr John please advise.  

## 2017-03-27 NOTE — Telephone Encounter (Signed)
Pt has been informed and expressed understanding.  

## 2017-03-28 NOTE — Assessment & Plan Note (Signed)
For urology referral 

## 2017-03-28 NOTE — Assessment & Plan Note (Signed)
stable overall by history and exam, recent data reviewed with pt, and pt to continue medical treatment as before,  to f/u any worsening symptoms or concerns BP Readings from Last 3 Encounters:  03/25/17 118/78  09/20/16 130/74  08/12/16 (!) 158/64

## 2017-03-28 NOTE — Assessment & Plan Note (Signed)
Mild to mod, for antibx course,  to f/u any worsening symptoms or concerns 

## 2017-03-28 NOTE — Assessment & Plan Note (Signed)
stable overall by history and exam, recent data reviewed with pt, and pt to continue medical treatment as before,  to f/u any worsening symptoms or concerns Lab Results  Component Value Date   HGBA1C 7.2 (H) 03/20/2017

## 2017-03-28 NOTE — Assessment & Plan Note (Signed)
Resolved with irrigation wax impaction,  to f/u any worsening symptoms or concerns

## 2017-03-28 NOTE — Assessment & Plan Note (Signed)
stable overall by history and exam, recent data reviewed with pt, and pt to continue medical treatment as before,  to f/u any worsening symptoms or concerns Lab Results  Component Value Date   LDLCALC 58 03/20/2017

## 2017-04-11 LAB — HM DIABETES EYE EXAM

## 2017-04-14 LAB — HM MAMMOGRAPHY

## 2017-04-15 ENCOUNTER — Encounter: Payer: Self-pay | Admitting: Internal Medicine

## 2017-04-15 NOTE — Progress Notes (Signed)
Abstracted and sent to scan  

## 2017-07-07 ENCOUNTER — Other Ambulatory Visit: Payer: Self-pay | Admitting: Internal Medicine

## 2017-07-16 ENCOUNTER — Ambulatory Visit (INDEPENDENT_AMBULATORY_CARE_PROVIDER_SITE_OTHER): Payer: Medicare Other | Admitting: Internal Medicine

## 2017-07-16 ENCOUNTER — Encounter: Payer: Self-pay | Admitting: Internal Medicine

## 2017-07-16 VITALS — BP 124/78 | HR 68 | Temp 98.6°F | Ht 67.0 in | Wt 248.0 lb

## 2017-07-16 DIAGNOSIS — Z23 Encounter for immunization: Secondary | ICD-10-CM | POA: Diagnosis not present

## 2017-07-16 DIAGNOSIS — H6983 Other specified disorders of Eustachian tube, bilateral: Secondary | ICD-10-CM | POA: Diagnosis not present

## 2017-07-16 DIAGNOSIS — L049 Acute lymphadenitis, unspecified: Secondary | ICD-10-CM | POA: Diagnosis not present

## 2017-07-16 DIAGNOSIS — E119 Type 2 diabetes mellitus without complications: Secondary | ICD-10-CM | POA: Diagnosis not present

## 2017-07-16 DIAGNOSIS — I10 Essential (primary) hypertension: Secondary | ICD-10-CM

## 2017-07-16 DIAGNOSIS — H6993 Unspecified Eustachian tube disorder, bilateral: Secondary | ICD-10-CM | POA: Insufficient documentation

## 2017-07-16 MED ORDER — AZITHROMYCIN 250 MG PO TABS: ORAL_TABLET | ORAL | 1 refills | Status: DC

## 2017-07-16 NOTE — Patient Instructions (Signed)
Please take all new medication as prescribed - the antibiotic  Please continue all other medications as before, and refills have been done if requested.  Please have the pharmacy call with any other refills you may need.  Please continue your efforts at being more active, low cholesterol diet, and weight control.  Please keep your appointments with your specialists as you may have planned    

## 2017-07-16 NOTE — Assessment & Plan Note (Signed)
Mild to mod, for otc mucinex prn,  to f/u any worsening symptoms or concerns

## 2017-07-16 NOTE — Assessment & Plan Note (Signed)
stable overall by history and exam, recent data reviewed with pt, and pt to continue medical treatment as before,  to f/u any worsening symptoms or concerns BP Readings from Last 3 Encounters:  07/16/17 124/78  03/25/17 118/78  09/20/16 130/74

## 2017-07-16 NOTE — Assessment & Plan Note (Signed)
stable overall by history and exam, recent data reviewed with pt, and pt to continue medical treatment as before,  to f/u any worsening symptoms or concerns  

## 2017-07-16 NOTE — Assessment & Plan Note (Signed)
Left axilla, likely infectious related, Mild to mod, for antibx course,  to f/u any worsening symptoms or concerns

## 2017-07-16 NOTE — Progress Notes (Signed)
Subjective:    Patient ID: Connie Marshall, female    DOB: 1946-05-23, 71 y.o.   MRN: [id number]  HPI  Here with acute onset 3 days left axillary tender swelling areas with tenderness; no fever, chills, ST, cough and Pt denies chest pain, increased sob or doe, wheezing, orthopnea, PND, increased LE swelling, palpitations, dizziness or syncope.   Pt denies polydipsia, polyuria, Pt denies new neurological symptoms such as new headache, or facial or extremity weakness or numbness Does also have mild nasal allergy symptoms but bilat ear popping and crackling with some muffled hearing, but no dizziness or n/v.  Past Medical History:  Diagnosis Date  . Diabetes mellitus    borderline/NO MEDS  . Diverticulosis   . Hemorrhoids   . Hyperlipidemia   . Hypertension   . IBS (irritable bowel syndrome)   . Obesity    Past Surgical History:  Procedure Laterality Date  . CESAREAN SECTION     2 times  . CHOLECYSTECTOMY     laproscopic  . CHOLECYSTECTOMY, LAPAROSCOPIC    . kidney stone removed  2011  . THYROGLOSSAL DUCT CYST  2000  . TONSILLECTOMY  2671  . umbilical herniorrhapy    . VAGINAL HYSTERECTOMY  2003    reports that she has never smoked. She has never used smokeless tobacco. She reports that she does not drink alcohol or use drugs. family history includes Diabetes in her father; Hypertension in her father and mother; Parkinson's disease in her sister; Prostate cancer in her brother. Allergies  Allergen Reactions  . Tape     ALLERGIC TO NON-ALLERGIC TAPE-causes a rash   Current Outpatient Prescriptions on File Prior to Visit  Medication Sig Dispense Refill  . clotrimazole-betamethasone (LOTRISONE) cream Use as directed 15 g 1  . flurandrenolide (CORDRAN) 0.05 % lotion     . fosinopril (MONOPRIL) 20 MG tablet take 1 tablet by mouth once daily 90 tablet 3  . furosemide (LASIX) 40 MG tablet take 1 tablet by mouth once daily 30 tablet 5  . halobetasol (ULTRAVATE) 0.05 % ointment     .  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 simvastatin (ZOCOR) 20 MG tablet take 1 tablet by mouth every evening 30 tablet 11  . triamcinolone (KENALOG) 0.025 % ointment Apply 1 application topically 2 (two) times daily.     Current Facility-Administered Medications on File Prior to Visit  Medication Dose Route Frequency Provider Last Rate Last Dose  . pneumococcal 23 valent vaccine (PNU-IMMUNE) injection 0.5 mL  0.5 mL Intramuscular Once Norins, Heinz Knuckles, MD       Review of Systems  Constitutional: Negative for other unusual diaphoresis or sweats HENT: Negative for ear discharge or swelling Eyes: Negative for other worsening visual disturbances Respiratory: Negative for stridor or other swelling  Gastrointestinal: Negative for worsening distension or other blood Genitourinary: Negative for retention or other urinary change Musculoskeletal: Negative for other MSK pain or swelling Skin: Negative for color change or other new lesions Neurological: Negative for worsening tremors and other numbness  Psychiatric/Behavioral: Negative for worsening agitation or other fatigue All other system neg per pt    Objective:   Physical Exam BP 124/78   Pulse 68   Temp 98.6 F (37 C) (Oral)   Ht 5\' 7"  (1.702 m)   Wt 248 lb (112.5 kg)   SpO2 99%   BMI 38.84 kg/m  VS noted, not ill appearing Constitutional: Pt appears in NAD HENT: Head:  NCAT.  Right Ear: External ear normal.  Left Ear: External ear normal.  Eyes: . Pupils are equal, round, and reactive to light. Conjunctivae and EOM are normal Bilat tm's with mild erythema.  Max sinus areas none tender.  Pharynx with mild erythema, no exudate Nose: without d/c or deformity Neck: Neck supple. Gross normal ROM Cardiovascular: Normal rate and regular rhythm.   Pulmonary/Chest: Effort normal and breath sounds without rales or wheezing.  Left axilla with 3 small < 1 cm appearing tender nodules like masses; right axilla clear,  no overlying skin changes or drainage Neurological: Pt is alert. At baseline orientation, motor grossly intact Skin: Skin is warm. No rashes, other new lesions, no LE edema Psychiatric: Pt behavior is normal without agitation  No other exam findings    Assessment & Plan:

## 2017-09-19 ENCOUNTER — Other Ambulatory Visit (INDEPENDENT_AMBULATORY_CARE_PROVIDER_SITE_OTHER): Payer: Medicare Other

## 2017-09-19 DIAGNOSIS — Z0000 Encounter for general adult medical examination without abnormal findings: Secondary | ICD-10-CM | POA: Diagnosis not present

## 2017-09-19 DIAGNOSIS — E119 Type 2 diabetes mellitus without complications: Secondary | ICD-10-CM

## 2017-09-19 LAB — CBC WITH DIFFERENTIAL/PLATELET
BASOS ABS: 0 10*3/uL (ref 0.0–0.1)
Basophils Relative: 0.7 % (ref 0.0–3.0)
EOS ABS: 0.1 10*3/uL (ref 0.0–0.7)
Eosinophils Relative: 4.2 % (ref 0.0–5.0)
HEMATOCRIT: 41 % (ref 36.0–46.0)
Hemoglobin: 13.8 g/dL (ref 12.0–15.0)
LYMPHS ABS: 1.4 10*3/uL (ref 0.7–4.0)
LYMPHS PCT: 40.1 % (ref 12.0–46.0)
MCHC: 33.5 g/dL (ref 30.0–36.0)
MCV: 88 fl (ref 78.0–100.0)
Monocytes Absolute: 0.4 10*3/uL (ref 0.1–1.0)
Monocytes Relative: 10 % (ref 3.0–12.0)
NEUTROS ABS: 1.6 10*3/uL (ref 1.4–7.7)
NEUTROS PCT: 45 % (ref 43.0–77.0)
PLATELETS: 257 10*3/uL (ref 150.0–400.0)
RBC: 4.66 Mil/uL (ref 3.87–5.11)
RDW: 13.2 % (ref 11.5–15.5)
WBC: 3.6 10*3/uL — ABNORMAL LOW (ref 4.0–10.5)

## 2017-09-19 LAB — HEPATIC FUNCTION PANEL
ALBUMIN: 4.3 g/dL (ref 3.5–5.2)
ALK PHOS: 56 U/L (ref 39–117)
ALT: 15 U/L (ref 0–35)
AST: 20 U/L (ref 0–37)
BILIRUBIN DIRECT: 0.1 mg/dL (ref 0.0–0.3)
BILIRUBIN TOTAL: 0.6 mg/dL (ref 0.2–1.2)
Total Protein: 6.9 g/dL (ref 6.0–8.3)

## 2017-09-19 LAB — BASIC METABOLIC PANEL
BUN: 14 mg/dL (ref 6–23)
CHLORIDE: 104 meq/L (ref 96–112)
CO2: 27 meq/L (ref 19–32)
Calcium: 9.6 mg/dL (ref 8.4–10.5)
Creatinine, Ser: 0.89 mg/dL (ref 0.40–1.20)
GFR: 80.27 mL/min (ref >60.00)
Glucose, Bld: 109 mg/dL — ABNORMAL HIGH (ref 70–99)
POTASSIUM: 4.3 meq/L (ref 3.5–5.1)
Sodium: 138 mEq/L (ref 135–145)

## 2017-09-19 LAB — URINALYSIS, ROUTINE W REFLEX MICROSCOPIC
Bilirubin Urine: NEGATIVE
Hgb urine dipstick: NEGATIVE
KETONES UR: NEGATIVE
LEUKOCYTES UA: NEGATIVE
NITRITE: NEGATIVE
Specific Gravity, Urine: 1.025 (ref 1.000–1.030)
Total Protein, Urine: NEGATIVE
URINE GLUCOSE: NEGATIVE
Urobilinogen, UA: 0.2 (ref 0.0–1.0)
pH: 6 (ref 5.0–8.0)

## 2017-09-19 LAB — LIPID PANEL
Cholesterol: 119 mg/dL (ref 0–200)
HDL: 44.9 mg/dL (ref >39.00)
LDL Cholesterol: 62 mg/dL (ref 0–99)
NonHDL: 74.23
TRIGLYCERIDES: 59 mg/dL (ref 0.0–149.0)
Total CHOL/HDL Ratio: 3
VLDL: 11.8 mg/dL (ref 0.0–40.0)

## 2017-09-19 LAB — MICROALBUMIN / CREATININE URINE RATIO
CREATININE, U: 138.1 mg/dL
MICROALB/CREAT RATIO: 0.6 mg/g (ref 0.0–30.0)
Microalb, Ur: 0.8 mg/dL (ref 0.0–1.9)

## 2017-09-19 LAB — HEMOGLOBIN A1C: Hgb A1c MFr Bld: 6.4 % (ref 4.6–6.5)

## 2017-09-19 LAB — TSH: TSH: 2.06 u[IU]/mL (ref 0.35–4.50)

## 2017-09-25 ENCOUNTER — Encounter: Payer: Medicare Other | Admitting: Internal Medicine

## 2017-09-28 ENCOUNTER — Other Ambulatory Visit: Payer: Self-pay | Admitting: Internal Medicine

## 2017-09-29 ENCOUNTER — Encounter: Payer: Self-pay | Admitting: Internal Medicine

## 2017-09-29 ENCOUNTER — Ambulatory Visit (INDEPENDENT_AMBULATORY_CARE_PROVIDER_SITE_OTHER): Payer: Medicare Other | Admitting: Internal Medicine

## 2017-09-29 VITALS — BP 122/68 | HR 68 | Temp 97.9°F | Ht 67.0 in | Wt 249.0 lb

## 2017-09-29 DIAGNOSIS — E119 Type 2 diabetes mellitus without complications: Secondary | ICD-10-CM

## 2017-09-29 DIAGNOSIS — Z0000 Encounter for general adult medical examination without abnormal findings: Secondary | ICD-10-CM

## 2017-09-29 DIAGNOSIS — Z23 Encounter for immunization: Secondary | ICD-10-CM | POA: Diagnosis not present

## 2017-09-29 MED ORDER — FOSINOPRIL SODIUM 20 MG PO TABS: 20.0000 mg | ORAL_TABLET | Freq: Every day | ORAL | 3 refills | Status: DC

## 2017-09-29 NOTE — Progress Notes (Signed)
Subjective:    Patient ID: Connie Marshall, female    DOB: 1946-02-15, 71 y.o.   MRN: [id number]  HPI  Here for wellness and f/u;  Overall doing ok;  Pt denies Chest pain, worsening SOB, DOE, wheezing, orthopnea, PND, worsening LE edema, palpitations, dizziness or syncope.  Pt denies neurological change such as new headache, facial or extremity weakness.  Pt denies polydipsia, polyuria, or low sugar symptoms. Pt states overall good compliance with treatment and medications, good tolerability, and has been trying to follow appropriate diet.  Pt denies worsening depressive symptoms, suicidal ideation or panic. No fever, night sweats, wt loss, loss of appetite, or other constitutional symptoms.  Pt states good ability with ADL's, has low fall risk, home safety reviewed and adequate, no other significant changes in hearing or vision, and occasionally active [facility], though not last wk due to weather Past Medical History:  Diagnosis Date  . Diabetes mellitus    borderline/NO MEDS  . Diverticulosis   . Hemorrhoids   . Hyperlipidemia   . Hypertension   . IBS (irritable bowel syndrome)   . Obesity    Past Surgical History:  Procedure Laterality Date  . CESAREAN SECTION     2 times  . CHOLECYSTECTOMY     laproscopic  . CHOLECYSTECTOMY, LAPAROSCOPIC    . kidney stone removed  2011  . THYROGLOSSAL DUCT CYST  2000  . TONSILLECTOMY  4401  . umbilical herniorrhapy    . VAGINAL HYSTERECTOMY  2003    reports that  has never smoked. she has never used smokeless tobacco. She reports that she does not drink alcohol or use drugs. family history includes Diabetes in her father; Hypertension in her father and mother; Parkinson's disease in her sister; Prostate cancer in her brother. Allergies  Allergen Reactions  . Tape     ALLERGIC TO NON-ALLERGIC TAPE-causes a rash   Current Outpatient Medications on File Prior to Visit  Medication Sig Dispense Refill   . clotrimazole-betamethasone (LOTRISONE) cream Use as directed 15 g 1  . flurandrenolide (CORDRAN) 0.05 % lotion     . furosemide (LASIX) 40 MG tablet take 1 tablet by mouth once daily 30 tablet 5  . halobetasol (ULTRAVATE) 0.05 % ointment     . 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 simvastatin (ZOCOR) 20 MG tablet take 1 tablet by mouth every evening 30 tablet 11  . triamcinolone (KENALOG) 0.025 % ointment Apply 1 application topically 2 (two) times daily.     Current Facility-Administered Medications on File Prior to Visit  Medication Dose Route Frequency Provider Last Rate Last Dose  . pneumococcal 23 valent vaccine (PNU-IMMUNE) injection 0.5 mL  0.5 mL Intramuscular Once Norins, Heinz Knuckles, MD       Review of Systems Constitutional: Negative for other unusual diaphoresis, sweats, appetite or weight changes HENT: Negative for other worsening hearing loss, ear pain, facial swelling, mouth sores or neck stiffness.   Eyes: Negative for other worsening pain, redness or other visual disturbance.  Respiratory: Negative for other stridor or swelling Cardiovascular: Negative for other palpitations or other chest pain  Gastrointestinal: Negative for worsening diarrhea or loose stools, blood in stool, distention or other pain Genitourinary: Negative for hematuria, flank pain or other change in urine volume.  Musculoskeletal: Negative for myalgias or other joint swelling.  Skin: Negative for other color change, or other wound or worsening drainage.  Neurological: Negative for other syncope or numbness. Hematological: Negative for other adenopathy or swelling Psychiatric/Behavioral: Negative for hallucinations, other worsening agitation, SI, self-injury, or new decreased concentration All other system neg per pt    Objective:   Physical Exam BP 122/68 (BP Location: Left Arm, Patient Position: Sitting, Cuff Size: Large)   Pulse 68   Temp 97.9 F (36.6 C) (Oral)   Ht  5\' 7"  (1.702 m)   Wt 249 lb (112.9 kg)   SpO2 98%   BMI 39.00 kg/m  VS noted,  Constitutional: Pt is oriented to person, place, and time. Appears well-developed and well-nourished, in no significant distress and comfortable Head: Normocephalic and atraumatic  Eyes: Conjunctivae and EOM are normal. Pupils are equal, round, and reactive to light Right Ear: External ear normal without discharge Left Ear: External ear normal without discharge Nose: Nose without discharge or deformity Mouth/Throat: Oropharynx is without other ulcerations and moist  Neck: Normal range of motion. Neck supple. No JVD present. No tracheal deviation present or significant neck LA or mass Cardiovascular: Normal rate, regular rhythm, normal heart sounds and intact distal pulses.   Pulmonary/Chest: WOB normal and breath sounds without rales or wheezing  Abdominal: Soft. Bowel sounds are normal. NT. No HSM  Musculoskeletal: Normal range of motion. Exhibits no edema Lymphadenopathy: Has no other cervical adenopathy.  Neurological: Pt is alert and oriented to person, place, and time. Pt has normal reflexes. No cranial nerve deficit. Motor grossly intact, Gait intact Skin: Skin is warm and dry. No rash noted or new ulcerations Psychiatric:  Has normal mood and affect. Behavior is normal without agitation No other exam findings Lab Results  Component Value Date   WBC 3.6 (L) 09/19/2017   HGB 13.8 09/19/2017   HCT 41.0 09/19/2017   PLT 257.0 09/19/2017   GLUCOSE 109 (H) 09/19/2017   CHOL 119 09/19/2017   TRIG 59.0 09/19/2017   HDL 44.90 09/19/2017   LDLDIRECT 115.2 09/26/2008   LDLCALC 62 09/19/2017   ALT 15 09/19/2017   AST 20 09/19/2017   NA 138 09/19/2017   K 4.3 09/19/2017   CL 104 09/19/2017   CREATININE 0.89 09/19/2017   BUN 14 09/19/2017   CO2 27 09/19/2017   TSH 2.06 09/19/2017   HGBA1C 6.4 09/19/2017   MICROALBUR 0.8 09/19/2017       Assessment & Plan:

## 2017-09-29 NOTE — Addendum Note (Signed)
Addended by: Karren Cobble on: 09/29/2017 09:17 AM   Modules accepted: Orders

## 2017-09-29 NOTE — Patient Instructions (Addendum)
You had the Tetanus shot today (tdap)  Please continue all other medications as before, and refills have been done if requested.  Please have the pharmacy call with any other refills you may need.  Please continue your efforts at being more active, low cholesterol diet, and weight control.  You are otherwise up to date with prevention measures today.  Please keep your appointments with your specialists as you may have planned  Please return in 6 months, or sooner if needed, with Lab testing done 3-5 days before

## 2017-11-10 ENCOUNTER — Other Ambulatory Visit: Payer: Self-pay | Admitting: Internal Medicine

## 2018-01-29 ENCOUNTER — Other Ambulatory Visit: Payer: Self-pay | Admitting: Internal Medicine

## 2018-03-25 ENCOUNTER — Other Ambulatory Visit (INDEPENDENT_AMBULATORY_CARE_PROVIDER_SITE_OTHER): Payer: Medicare Other

## 2018-03-25 DIAGNOSIS — E119 Type 2 diabetes mellitus without complications: Secondary | ICD-10-CM

## 2018-03-25 LAB — LIPID PANEL
CHOLESTEROL: 120 mg/dL (ref 0–200)
HDL: 36.1 mg/dL — ABNORMAL LOW (ref >39.00)
LDL Cholesterol: 61 mg/dL (ref 0–99)
NONHDL: 83.61
TRIGLYCERIDES: 113 mg/dL (ref 0.0–149.0)
Total CHOL/HDL Ratio: 3
VLDL: 22.6 mg/dL (ref 0.0–40.0)

## 2018-03-25 LAB — BASIC METABOLIC PANEL
BUN: 10 mg/dL (ref 6–23)
CALCIUM: 10.1 mg/dL (ref 8.4–10.5)
CO2: 28 mEq/L (ref 19–32)
Chloride: 105 mEq/L (ref 96–112)
Creatinine, Ser: 0.87 mg/dL (ref 0.40–1.20)
GFR: 82.28 mL/min (ref >60.00)
GLUCOSE: 117 mg/dL — AB (ref 70–99)
Potassium: 4.8 mEq/L (ref 3.5–5.1)
SODIUM: 139 meq/L (ref 135–145)

## 2018-03-25 LAB — HEPATIC FUNCTION PANEL
ALBUMIN: 4.3 g/dL (ref 3.5–5.2)
ALT: 11 U/L (ref 0–35)
AST: 16 U/L (ref 0–37)
Alkaline Phosphatase: 50 U/L (ref 39–117)
Bilirubin, Direct: 0.1 mg/dL (ref 0.0–0.3)
TOTAL PROTEIN: 6.8 g/dL (ref 6.0–8.3)
Total Bilirubin: 0.6 mg/dL (ref 0.2–1.2)

## 2018-03-25 LAB — HEMOGLOBIN A1C: Hgb A1c MFr Bld: 6.7 % — ABNORMAL HIGH (ref 4.6–6.5)

## 2018-03-30 ENCOUNTER — Ambulatory Visit (INDEPENDENT_AMBULATORY_CARE_PROVIDER_SITE_OTHER): Payer: Medicare Other | Admitting: Internal Medicine

## 2018-03-30 ENCOUNTER — Encounter: Payer: Self-pay | Admitting: Internal Medicine

## 2018-03-30 VITALS — BP 124/82 | HR 74 | Temp 98.5°F | Ht 67.0 in | Wt 247.0 lb

## 2018-03-30 DIAGNOSIS — E119 Type 2 diabetes mellitus without complications: Secondary | ICD-10-CM | POA: Diagnosis not present

## 2018-03-30 DIAGNOSIS — E785 Hyperlipidemia, unspecified: Secondary | ICD-10-CM | POA: Diagnosis not present

## 2018-03-30 DIAGNOSIS — I10 Essential (primary) hypertension: Secondary | ICD-10-CM | POA: Diagnosis not present

## 2018-03-30 NOTE — Assessment & Plan Note (Signed)
stable overall by history and exam, recent data reviewed with pt, and pt to continue medical treatment as before,  to f/u any worsening symptoms or concerns Lab Results  Component Value Date   LDLCALC 61 03/25/2018

## 2018-03-30 NOTE — Progress Notes (Signed)
Subjective:    Patient ID: Connie Marshall, female    DOB: [birth year], 72 y.o.   MRN: [id number]  HPI  Here to f/u; overall doing ok,  Pt denies chest pain, increasing sob or doe, wheezing, orthopnea, PND, increased LE swelling, palpitations, dizziness or syncope.  Pt denies new neurological symptoms such as new headache, or facial or extremity weakness or numbness.  Pt denies polydipsia, polyuria, or low sugar episode.  Pt states overall good compliance with meds, mostly trying to follow appropriate diet, with wt overall stable,  but little exercise however.   Wt Readings from Last 3 Encounters:  03/30/18 247 lb (112 kg)  09/29/17 249 lb (112.9 kg)  07/16/17 248 lb (112.5 kg)   Past Medical History:  Diagnosis Date  . Diabetes mellitus    borderline/NO MEDS  . Diverticulosis   . Hemorrhoids   . Hyperlipidemia   . Hypertension   . IBS (irritable bowel syndrome)   . Obesity    Past Surgical History:  Procedure Laterality Date  . CESAREAN SECTION     2 times  . CHOLECYSTECTOMY     laproscopic  . CHOLECYSTECTOMY, LAPAROSCOPIC    . kidney stone removed  2011  . THYROGLOSSAL DUCT CYST  2000  . TONSILLECTOMY  0160  . umbilical herniorrhapy    . VAGINAL HYSTERECTOMY  2003    reports that she has never smoked. She has never used smokeless tobacco. She reports that she does not drink alcohol or use drugs. family history includes Diabetes in her father; Hypertension in her father and mother; Parkinson's disease in her sister; Prostate cancer in her brother. Allergies  Allergen Reactions  . Tape     ALLERGIC TO NON-ALLERGIC TAPE-causes a rash   Current Outpatient Medications on File Prior to Visit  Medication Sig Dispense Refill  . clotrimazole-betamethasone (LOTRISONE) cream Use as directed 15 g 1  . flurandrenolide (CORDRAN) 0.05 % lotion     . fosinopril (MONOPRIL) 20 MG tablet Take 1 tablet (20 mg total) by mouth daily. 90 tablet 3  . furosemide (LASIX) 40 MG tablet TAKE 1  TABLET BY MOUTH ONCE DAILY 30 tablet 2  . halobetasol (ULTRAVATE) 0.05 % ointment     . 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 simvastatin (ZOCOR) 20 MG tablet take 1 tablet by mouth every evening 30 tablet 11  . triamcinolone (KENALOG) 0.025 % ointment Apply 1 application topically 2 (two) times daily.     Current Facility-Administered Medications on File Prior to Visit  Medication Dose Route Frequency Provider Last Rate Last Dose  . pneumococcal 23 valent vaccine (PNU-IMMUNE) injection 0.5 mL  0.5 mL Intramuscular Once Norins, Heinz Knuckles, MD       Review of Systems  Constitutional: Negative for other unusual diaphoresis or sweats HENT: Negative for ear discharge or swelling Eyes: Negative for other worsening visual disturbances Respiratory: Negative for stridor or other swelling  Gastrointestinal: Negative for worsening distension or other blood Genitourinary: Negative for retention or other urinary change Musculoskeletal: Negative for other MSK pain or swelling Skin: Negative for color change or other new lesions Neurological: Negative for worsening tremors and other numbness  Psychiatric/Behavioral: Negative for worsening agitation or other fatigue All other system neg per pt    Objective:   Physical Exam BP 124/82   Pulse 74   Temp 98.5 F (36.9 C) (Oral)   Ht 5\' 7"  (1.702 m)   Wt 247 lb (112 kg)  SpO2 96%   BMI 38.69 kg/m  VS noted,  Constitutional: Pt appears in NAD HENT: Head: NCAT.  Right Ear: External ear normal.  Left Ear: External ear normal.  Eyes: . Pupils are equal, round, and reactive to light. Conjunctivae and EOM are normal Nose: without d/c or deformity Neck: Neck supple. Gross normal ROM Cardiovascular: Normal rate and regular rhythm.   Pulmonary/Chest: Effort normal and breath sounds without rales or wheezing.  Abd:  Soft, NT, ND, + BS, no organomegaly Neurological: Pt is alert. At baseline orientation, motor grossly  intact Skin: Skin is warm. No rashes, other new lesions, no LE edema Psychiatric: Pt behavior is normal without agitation  No other exam findings Lab Results  Component Value Date   WBC 3.6 (L) 09/19/2017   HGB 13.8 09/19/2017   HCT 41.0 09/19/2017   PLT 257.0 09/19/2017   GLUCOSE 117 (H) 03/25/2018   CHOL 120 03/25/2018   TRIG 113.0 03/25/2018   HDL 36.10 (L) 03/25/2018   LDLDIRECT 115.2 09/26/2008   LDLCALC 61 03/25/2018   ALT 11 03/25/2018   AST 16 03/25/2018   NA 139 03/25/2018   K 4.8 03/25/2018   CL 105 03/25/2018   CREATININE 0.87 03/25/2018   BUN 10 03/25/2018   CO2 28 03/25/2018   TSH 2.06 09/19/2017   HGBA1C 6.7 (H) 03/25/2018   MICROALBUR 0.8 09/19/2017       Assessment & Plan:

## 2018-03-30 NOTE — Patient Instructions (Signed)
Please continue all other medications as before, and refills have been done if requested.  Please have the pharmacy call with any other refills you may need.  Please continue your efforts at being more active, low cholesterol diet, and weight control.  You are otherwise up to date with prevention measures today.  Please keep your appointments with your specialists as you may have planned  Please return in 6 months, or sooner if needed, with Lab testing done 3-5 days before  

## 2018-03-30 NOTE — Assessment & Plan Note (Signed)
stable overall by history and exam, recent data reviewed with pt, and pt to continue medical treatment as before,  to f/u any worsening symptoms or concerns Lab Results  Component Value Date   HGBA1C 6.7 (H) 03/25/2018

## 2018-03-30 NOTE — Assessment & Plan Note (Signed)
stable overall by history and exam, recent data reviewed with pt, and pt to continue medical treatment as before,  to f/u any worsening symptoms or concerns  

## 2018-04-05 ENCOUNTER — Other Ambulatory Visit: Payer: Self-pay | Admitting: Internal Medicine

## 2018-04-15 ENCOUNTER — Telehealth: Payer: Self-pay | Admitting: Internal Medicine

## 2018-04-15 NOTE — Telephone Encounter (Signed)
Copied from [place] [id number]. Topic: Quick Communication - See Telephone Encounter >> Apr 15, 2018  8:21 AM Burchel, Abbi R wrote: Pt is requesting that Dr Jenny Reichmann send something to the pharmacy for her to treat diarrhea she has had since Sunday, 04/12/18.  Pt states she has been taking Pepto Bismol with minimum relief.    Preferred Pharmacy: [facility] [place], [place] - [phone] (Phone) [phone] (Fax)    Please call Pt to notify: [phone]

## 2018-04-15 NOTE — Telephone Encounter (Signed)
noted 

## 2018-04-15 NOTE — Telephone Encounter (Signed)
Called patient to schedule. She said that since she called earlier she has been feeling better so she is going to wait and see how it goes. If needed, she will call back.

## 2018-04-15 NOTE — Telephone Encounter (Signed)
Unfortunately, we are not supposed to give something like lomotil unless it is proven by exam that there is no infection, so I would suggest OV or UC or ED if having fever, worsening abd pain

## 2018-04-29 LAB — HM MAMMOGRAPHY

## 2018-05-11 LAB — HM DIABETES EYE EXAM

## 2018-07-21 ENCOUNTER — Ambulatory Visit (INDEPENDENT_AMBULATORY_CARE_PROVIDER_SITE_OTHER): Payer: Medicare Other | Admitting: *Deleted

## 2018-07-21 DIAGNOSIS — Z23 Encounter for immunization: Secondary | ICD-10-CM | POA: Diagnosis not present

## 2018-09-14 ENCOUNTER — Other Ambulatory Visit: Payer: Self-pay | Admitting: Internal Medicine

## 2018-09-14 NOTE — Telephone Encounter (Signed)
Copied from [place] [id number]. Topic: Quick Communication - Rx Refill/Question >> Sep 14, 2018 12:25 PM Carolyn Stare wrote: Medication    fosinopril (MONOPRIL) 20 MG tablet  Has the patient contacted their pharmacy yes    If yes, when and what did the pharmacy advise   pharmacy told pt they had nothing   Preferred Pharmacy   [facility]   Agent: Please be advised that RX refills may take up to 3 business days. We ask that you follow-up with your pharmacy.

## 2018-09-15 ENCOUNTER — Other Ambulatory Visit: Payer: Self-pay | Admitting: Internal Medicine

## 2018-09-15 MED ORDER — FOSINOPRIL SODIUM 20 MG PO TABS: 20.0000 mg | ORAL_TABLET | Freq: Every day | ORAL | 0 refills | Status: DC

## 2018-09-15 NOTE — Telephone Encounter (Signed)
Requested Prescriptions  Pending Prescriptions Disp Refills  . fosinopril (MONOPRIL) 20 MG tablet 30 tablet 0    Sig: Take 1 tablet (20 mg total) by mouth daily.     Cardiovascular:  ACE Inhibitors Passed - 09/14/2018  4:19 PM      Passed - Cr in normal range and within 180 days    Creatinine, Ser  Date Value Ref Range Status  03/25/2018 0.87 0.40 - 1.20 mg/dL Final         Passed - K in normal range and within 180 days    Potassium  Date Value Ref Range Status  03/25/2018 4.8 3.5 - 5.1 mEq/L Final         Passed - Patient is not pregnant      Passed - Last BP in normal range    BP Readings from Last 1 Encounters:  03/30/18 124/82         Passed - Valid encounter within last 6 months    Recent Outpatient Visits          5 months ago Type 2 diabetes mellitus without complication, without long-term current use of insulin [facility])   [facility] -Georges Mouse, MD   11 months ago Preventative health care   [facility] -Georges Mouse, MD   1 year ago Acute lymphadenitis   [facility] -Georges Mouse, MD   1 year ago Type 2 diabetes mellitus without complication, without long-term current use of insulin [facility])   [facility] -Georges Mouse, MD   1 year ago Encounter for well adult exam with abnormal findings   [place], James W, MD      Future Appointments            In 2 weeks Jenny Reichmann, Hunt Oris, MD [place], [facility]

## 2018-09-22 ENCOUNTER — Ambulatory Visit: Payer: Self-pay

## 2018-09-22 NOTE — Telephone Encounter (Signed)
C/o swelling in feet and lower legs, up to knees, that started on Friday.  Stated the swelling did not decrease after in bed all night; remained the same when she got up this morning.  Denied any shortness of breath or chest pain.  Denied any redness, warmth, or tenderness of the LE's.  Reported elevated BP readings; 12/9 @ 9:40 AM 140/86, @ 4:30 PM 134/78, and 12/10 @ 8:45 AM 153/87.  Denied any headache or dizziness with elevated BP.  Appt. sched. 12/11 with PCP.  Care advice given per protocol.  Verb. Understanding.  Knows to call back if sx's worsen.   Reason for Disposition . [1] MODERATE leg swelling (e.g., swelling extends up to knees) AND [2] new onset or worsening  Answer Assessment - Initial Assessment Questions 1. ONSET: "When did the swelling start?" (e.g., minutes, hours, days)      Started on Friday   2. LOCATION: "What part of the leg is swollen?"  "Are both legs swollen or just one leg?"   Bilateral feet to knee 3. SEVERITY: "How bad is the swelling?" (e.g., localized; mild, moderate, severe)  - Localized - small area of swelling localized to one leg  - MILD pedal edema - swelling limited to foot and ankle, pitting edema < 1/4 inch (6 mm) deep, rest and elevation eliminate most or all swelling  - MODERATE edema - swelling of lower leg to knee, pitting edema > 1/4 inch (6 mm) deep, rest and elevation only partially reduce swelling  - SEVERE edema - swelling extends above knee, facial or hand swelling present      moderate 4. REDNESS: "Does the swelling look red or infected?"     Denied  5. PAIN: "Is the swelling painful to touch?" If so, ask: "How painful is it?"   (Scale 1-10; mild, moderate or severe)     Denied pain   6. FEVER: "Do you have a fever?" If so, ask: "What is it, how was it measured, and when did it start?"    Denied  7. CAUSE: "What do you think is causing the leg swelling?"     unsure 8. MEDICAL HISTORY: "Do you have a history of heart failure, kidney disease,  liver failure, or cancer?"     Denied  9. RECURRENT SYMPTOM: "Have you had leg swelling before?" If so, ask: "When was the last time?" "What happened that time?"    Denied  10. OTHER SYMPTOMS: "Do you have any other symptoms?" (e.g., chest pain, difficulty breathing)       Denied shortness of breath, chest pain, headache, or dizziness 11. PREGNANCY: "Is there any chance you are pregnant?" "When was your last menstrual period?"      n/a  Protocols used: LEG SWELLING AND EDEMA-A-AH

## 2018-09-23 ENCOUNTER — Encounter: Payer: Self-pay | Admitting: Internal Medicine

## 2018-09-23 ENCOUNTER — Ambulatory Visit (INDEPENDENT_AMBULATORY_CARE_PROVIDER_SITE_OTHER)
Admission: RE | Admit: 2018-09-23 | Discharge: 2018-09-23 | Disposition: A | Discharge status: Home / Self Care | Payer: Medicare Other | Source: Ambulatory Visit | Attending: Internal Medicine | Admitting: Internal Medicine

## 2018-09-23 ENCOUNTER — Ambulatory Visit (INDEPENDENT_AMBULATORY_CARE_PROVIDER_SITE_OTHER): Payer: Medicare Other | Admitting: Internal Medicine

## 2018-09-23 ENCOUNTER — Other Ambulatory Visit (INDEPENDENT_AMBULATORY_CARE_PROVIDER_SITE_OTHER): Payer: Medicare Other

## 2018-09-23 VITALS — BP 142/82 | HR 81 | Temp 98.0°F | Wt 143.0 lb

## 2018-09-23 DIAGNOSIS — E119 Type 2 diabetes mellitus without complications: Secondary | ICD-10-CM

## 2018-09-23 DIAGNOSIS — Z0001 Encounter for general adult medical examination with abnormal findings: Secondary | ICD-10-CM

## 2018-09-23 DIAGNOSIS — R609 Edema, unspecified: Secondary | ICD-10-CM

## 2018-09-23 DIAGNOSIS — I10 Essential (primary) hypertension: Secondary | ICD-10-CM | POA: Diagnosis not present

## 2018-09-23 LAB — HEPATIC FUNCTION PANEL
ALT: 18 U/L (ref 0–35)
AST: 21 U/L (ref 0–37)
Albumin: 4.6 g/dL (ref 3.5–5.2)
Alkaline Phosphatase: 55 U/L (ref 39–117)
Bilirubin, Direct: 0.1 mg/dL (ref 0.0–0.3)
Total Bilirubin: 0.5 mg/dL (ref 0.2–1.2)
Total Protein: 7.4 g/dL (ref 6.0–8.3)

## 2018-09-23 LAB — URINALYSIS, ROUTINE W REFLEX MICROSCOPIC
Bilirubin Urine: NEGATIVE
Hgb urine dipstick: NEGATIVE
Ketones, ur: NEGATIVE
Nitrite: NEGATIVE
RBC / HPF: NONE SEEN (ref 0–?)
Specific Gravity, Urine: 1.015 (ref 1.000–1.030)
TOTAL PROTEIN, URINE-UPE24: NEGATIVE
Urine Glucose: NEGATIVE
Urobilinogen, UA: 1 (ref 0.0–1.0)
pH: 6.5 (ref 5.0–8.0)

## 2018-09-23 LAB — BASIC METABOLIC PANEL
BUN: 13 mg/dL (ref 6–23)
CO2: 29 mEq/L (ref 19–32)
Calcium: 10.3 mg/dL (ref 8.4–10.5)
Chloride: 103 mEq/L (ref 96–112)
Creatinine, Ser: 0.82 mg/dL (ref 0.40–1.20)
GFR: 87.98 mL/min (ref >60.00)
Glucose, Bld: 110 mg/dL — ABNORMAL HIGH (ref 70–99)
Potassium: 3.9 mEq/L (ref 3.5–5.1)
SODIUM: 139 meq/L (ref 135–145)

## 2018-09-23 LAB — CBC WITH DIFFERENTIAL/PLATELET
Basophils Absolute: 0.1 10*3/uL (ref 0.0–0.1)
Basophils Relative: 1.5 % (ref 0.0–3.0)
EOS ABS: 0.1 10*3/uL (ref 0.0–0.7)
Eosinophils Relative: 2.9 % (ref 0.0–5.0)
HCT: 41.6 % (ref 36.0–46.0)
Hemoglobin: 14.2 g/dL (ref 12.0–15.0)
Lymphocytes Relative: 43.8 % (ref 12.0–46.0)
Lymphs Abs: 1.6 10*3/uL (ref 0.7–4.0)
MCHC: 34.2 g/dL (ref 30.0–36.0)
MCV: 86.9 fl (ref 78.0–100.0)
Monocytes Absolute: 0.3 10*3/uL (ref 0.1–1.0)
Monocytes Relative: 9.2 % (ref 3.0–12.0)
NEUTROS PCT: 42.6 % — AB (ref 43.0–77.0)
Neutro Abs: 1.6 10*3/uL (ref 1.4–7.7)
Platelets: 245 10*3/uL (ref 150.0–400.0)
RBC: 4.79 Mil/uL (ref 3.87–5.11)
RDW: 13.3 % (ref 11.5–15.5)
WBC: 3.7 10*3/uL — ABNORMAL LOW (ref 4.0–10.5)

## 2018-09-23 LAB — LIPID PANEL
Cholesterol: 127 mg/dL (ref 0–200)
HDL: 42.9 mg/dL (ref >39.00)
LDL Cholesterol: 65 mg/dL (ref 0–99)
NonHDL: 84.43
Total CHOL/HDL Ratio: 3
Triglycerides: 96 mg/dL (ref 0.0–149.0)
VLDL: 19.2 mg/dL (ref 0.0–40.0)

## 2018-09-23 LAB — TSH: TSH: 1.28 u[IU]/mL (ref 0.35–4.50)

## 2018-09-23 LAB — HEMOGLOBIN A1C: Hgb A1c MFr Bld: 6.4 % (ref 4.6–6.5)

## 2018-09-23 LAB — MICROALBUMIN / CREATININE URINE RATIO
CREATININE, U: 161.1 mg/dL
Microalb Creat Ratio: 0.6 mg/g (ref 0.0–30.0)
Microalb, Ur: 1 mg/dL (ref 0.0–1.9)

## 2018-09-23 LAB — BRAIN NATRIURETIC PEPTIDE: Pro B Natriuretic peptide (BNP): 17 pg/mL (ref 0.0–100.0)

## 2018-09-23 IMAGING — DX DG CHEST 2V
2 series · 2 of 2 positions shown · non-contrast
Comparison: None.

CLINICAL DATA: Bilateral lower extremity edema for the past week.

EXAM:
CHEST - 2 VIEW

[chest pa]
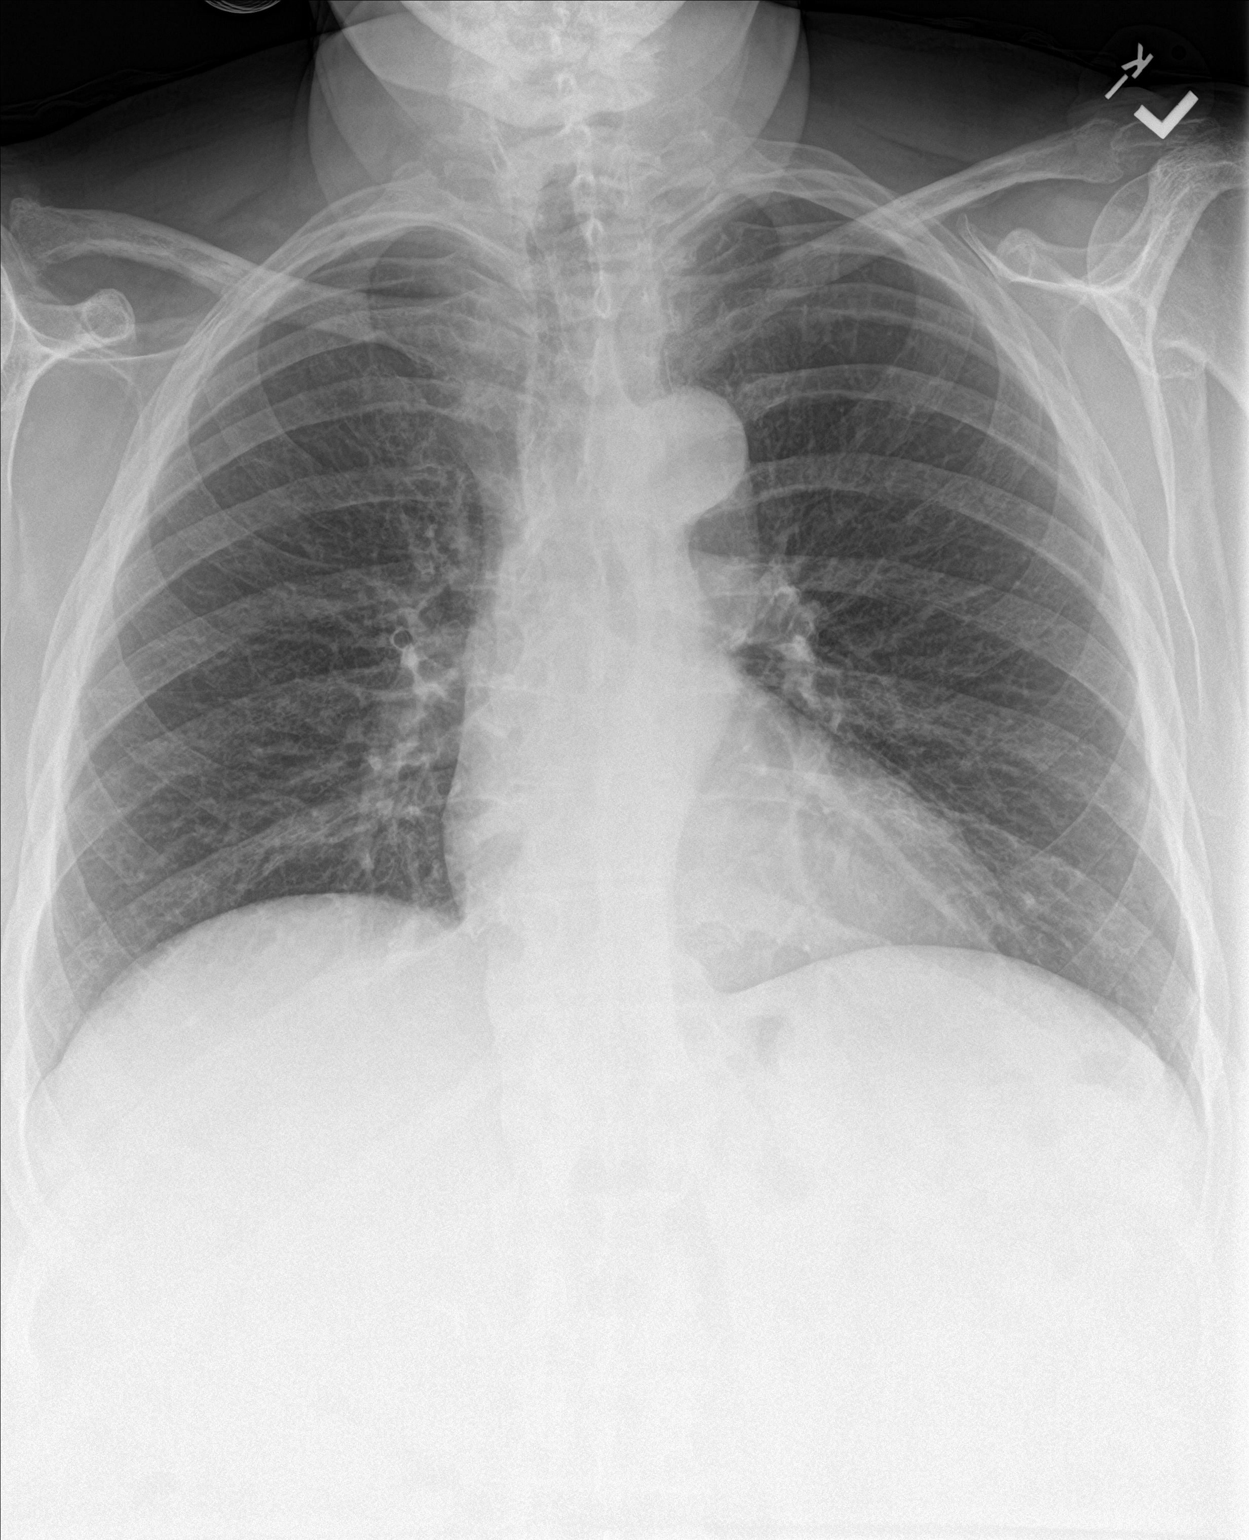

[chest lat]
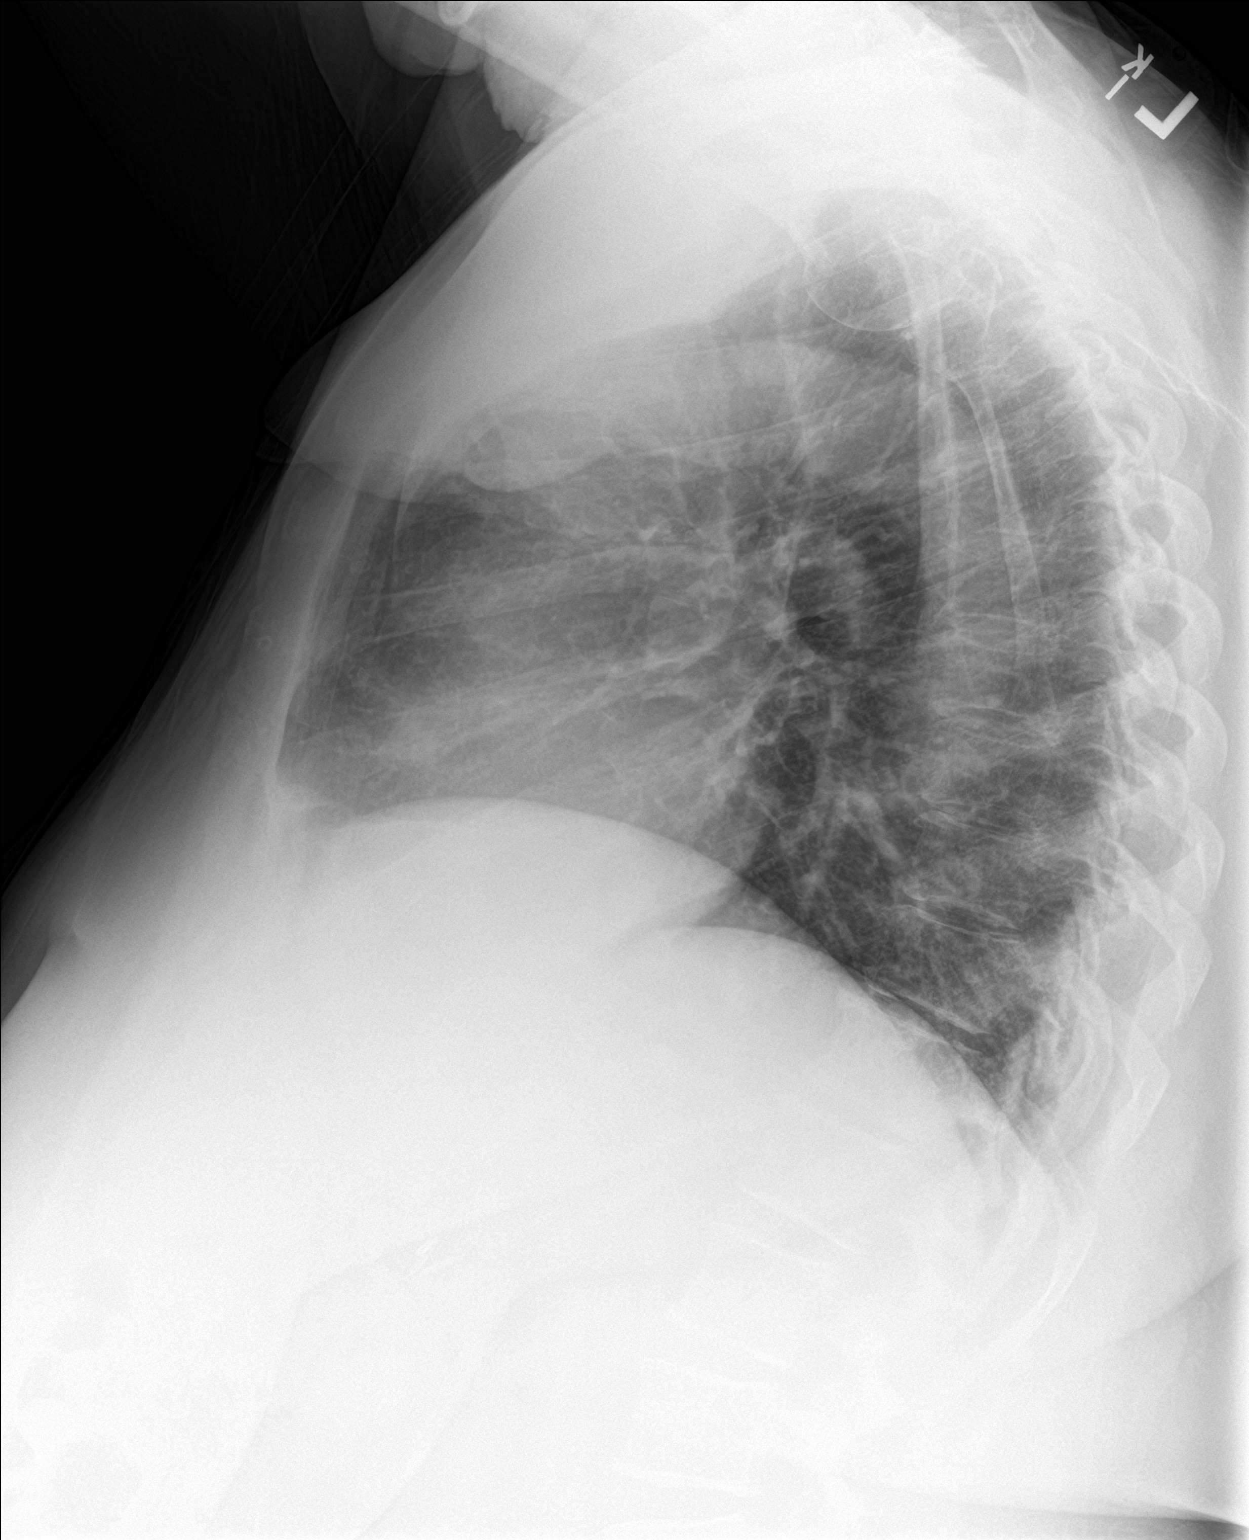

[2 of 2 positions shown; findings below may reference images not displayed]

FINDINGS: The heart size and mediastinal contours are within normal limits.
Both lungs are clear. The visualized skeletal structures are
unremarkable.
IMPRESSION: No active cardiopulmonary disease.

## 2018-09-23 MED ORDER — DICLOFENAC SODIUM 1 % TD GEL: 2.0000 g | Freq: Four times a day (QID) | TRANSDERMAL | 5 refills | Status: DC | PRN

## 2018-09-23 MED ORDER — ZOSTER VAC RECOMB ADJUVANTED 50 MCG/0.5ML IM SUSR: 0.5000 mL | Freq: Once | INTRAMUSCULAR | 1 refills | Status: AC

## 2018-09-23 MED ORDER — FUROSEMIDE 40 MG PO TABS: ORAL_TABLET | ORAL | 3 refills | Status: DC

## 2018-09-23 NOTE — Patient Instructions (Addendum)
Your shingles shot prescription was sent to the pharmacy  Ok to take extra lasix 40 mg later in the day for persistent leg swelling  Your EKG was OK today  You will be contacted regarding the referral for: Echocardiogram  Please continue all other medications as before, and refills have been done if requested.  Please have the pharmacy call with any other refills you may need.  Please continue your efforts at being more active, low cholesterol diet, and weight control.  You are otherwise up to date with prevention measures today.  Please keep your appointments with your specialists as you may have planned  Please go to the XRAY Department in the Basement (go straight as you get off the elevator) for the x-ray testing  Please go to the LAB in the Basement (turn left off the elevator) for the tests to be done today  You will be contacted by phone if any changes need to be made immediately.  Otherwise, you will receive a letter about your results with an explanation, but please check with MyChart first.  Please remember to sign up for MyChart if you have not done so, as this will be important to you in the future with finding out test results, communicating by private email, and scheduling acute appointments online when needed.  Please return in 6 months, or sooner if needed, with Lab testing done 3-5 days before

## 2018-09-23 NOTE — Assessment & Plan Note (Addendum)
ecg refveiwed, swelling is ? Venous insuff vs diast CHF or other - for lasix 40 qam and 40 qPM prn persistent swelling, for bnp with labs, check echo  In addition to the time spent performing CPE, I spent an additional 25 minutes face to face,in which greater than 50% of this time was spent in counseling and coordination of care for patient's acute illness as documented, including the differential dx, treatment, further evaluation and other management of edema, DM, and HTN

## 2018-09-23 NOTE — Progress Notes (Signed)
Subjective:    Patient ID: Connie Marshall, female    DOB: October 03, 1946, 72 y.o.   MRN: [id number]  HPI  Here for wellness and f/u;  Overall doing ok;  Marland Kitchen  Pt denies neurological change such as new headache, facial or extremity weakness.  Pt denies polydipsia, polyuria, or low sugar symptoms. Pt states overall good compliance with treatment and medications, good tolerability, and has been trying to follow appropriate diet.  Pt denies worsening depressive symptoms, suicidal ideation or panic. No fever, night sweats, wt loss, loss of appetite, or other constitutional symptoms.  Pt states good ability with ADL's, has low fall risk, home safety reviewed and adequate, no other significant changes in hearing or vision, and only occasionally active with exercise. Also, Pt denies chest pain, increased sob or doe, wheezing, orthopnea, PND, increased LE swelling, palpitations, dizziness or syncope, except for new onset swelling to left > right legs worse since 5 days despite good compliance with daily lasix 40 qam.  Also c/o 1 mo right upper leg lateral pain and tenderness, mild to mod, worse to walk or lay on that side, nothing else seems to make better or worse Past Medical History:  Diagnosis Date  . Diabetes mellitus    borderline/NO MEDS  . Diverticulosis   . Hemorrhoids   . Hyperlipidemia   . Hypertension   . IBS (irritable bowel syndrome)   . Obesity    Past Surgical History:  Procedure Laterality Date  . CESAREAN SECTION     2 times  . CHOLECYSTECTOMY     laproscopic  . CHOLECYSTECTOMY, LAPAROSCOPIC    . kidney stone removed  2011  . THYROGLOSSAL DUCT CYST  2000  . TONSILLECTOMY  3474  . umbilical herniorrhapy    . VAGINAL HYSTERECTOMY  2003    reports that she has never smoked. She has never used smokeless tobacco. She reports that she does not drink alcohol or use drugs. family history includes Diabetes in her father; Hypertension in her father and mother; Parkinson's disease in her  sister; Prostate cancer in her brother. Allergies  Allergen Reactions  . Tape     ALLERGIC TO NON-ALLERGIC TAPE-causes a rash   Current Outpatient Medications on File Prior to Visit  Medication Sig Dispense Refill  . clotrimazole-betamethasone (LOTRISONE) cream Use as directed 15 g 1  . flurandrenolide (CORDRAN) 0.05 % lotion     . fosinopril (MONOPRIL) 20 MG tablet TAKE 1 TABLET BY MOUTH DAILY 90 tablet 0  . halobetasol (ULTRAVATE) 0.05 % ointment     . 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 simvastatin (ZOCOR) 20 MG tablet take 1 tablet by mouth every evening 30 tablet 11  . triamcinolone (KENALOG) 0.025 % ointment Apply 1 application topically 2 (two) times daily.     No current facility-administered medications on file prior to visit.    Review of Systems Constitutional: Negative for other unusual diaphoresis, sweats, appetite or weight changes HENT: Negative for other worsening hearing loss, ear pain, facial swelling, mouth sores or neck stiffness.   Eyes: Negative for other worsening pain, redness or other visual disturbance.  Respiratory: Negative for other stridor or swelling Cardiovascular: Negative for other palpitations or other chest pain  Gastrointestinal: Negative for worsening diarrhea or loose stools, blood in stool, distention or other pain Genitourinary: Negative for hematuria, flank pain or other change in urine volume.  Musculoskeletal: Negative for myalgias or other joint swelling.  Skin: Negative for other  color change, or other wound or worsening drainage.  Neurological: Negative for other syncope or numbness. Hematological: Negative for other adenopathy or swelling Psychiatric/Behavioral: Negative for hallucinations, other worsening agitation, SI, self-injury, or new decreased concentration\All other system neg per pt    Objective:   Physical Exam BP (!) 142/82   Pulse 81   Temp 98 F (36.7 C)   Wt 143 lb (64.9 kg)   SpO2 96%    BMI 22.40 kg/m  VS noted,  Constitutional: Pt is oriented to person, place, and time. Appears well-developed and well-nourished, in no significant distress and comfortable Head: Normocephalic and atraumatic  Eyes: Conjunctivae and EOM are normal. Pupils are equal, round, and reactive to light Right Ear: External ear normal without discharge Left Ear: External ear normal without discharge Nose: Nose without discharge or deformity Mouth/Throat: Oropharynx is without other ulcerations and moist  Neck: Normal range of motion. Neck supple. No JVD present. No tracheal deviation present or significant neck LA or mass Cardiovascular: Normal rate, regular rhythm, normal heart sounds and intact distal pulses.   Pulmonary/Chest: WOB normal and breath sounds without rales or wheezing  Abdominal: Soft. Bowel sounds are normal. NT. No HSM  Musculoskeletal: Normal range of motion. Exhibits trace to 1+ bilat LE edema left > right Lymphadenopathy: Has no other cervical adenopathy.  Neurological: Pt is alert and oriented to person, place, and time. Pt has normal reflexes. No cranial nerve deficit. Motor grossly intact, Gait intact Skin: Skin is warm and dry. No rash noted or new ulcerations Psychiatric:  Has normal mood and affect. Behavior is normal without agitation No other exam findings  ECG today I have personally interpreted: NSR with poor R wave progression cant r/o anterior infarct Lab Results  Component Value Date   WBC 3.6 (L) 09/19/2017   HGB 13.8 09/19/2017   HCT 41.0 09/19/2017   PLT 257.0 09/19/2017   GLUCOSE 117 (H) 03/25/2018   CHOL 120 03/25/2018   TRIG 113.0 03/25/2018   HDL 36.10 (L) 03/25/2018   LDLDIRECT 115.2 09/26/2008   LDLCALC 61 03/25/2018   ALT 11 03/25/2018   AST 16 03/25/2018   NA 139 03/25/2018   K 4.8 03/25/2018   CL 105 03/25/2018   CREATININE 0.87 03/25/2018   BUN 10 03/25/2018   CO2 28 03/25/2018   TSH 2.06 09/19/2017   HGBA1C 6.7 (H) 03/25/2018    MICROALBUR 0.8 09/19/2017       Assessment & Plan:

## 2018-09-23 NOTE — Assessment & Plan Note (Signed)
stable overall by history and exam, recent data reviewed with pt, and pt to continue medical treatment as before,  to f/u any worsening symptoms or concerns  

## 2018-09-24 ENCOUNTER — Encounter: Payer: Self-pay | Admitting: Internal Medicine

## 2018-10-01 ENCOUNTER — Encounter: Payer: Medicare Other | Admitting: Internal Medicine

## 2018-10-03 ENCOUNTER — Other Ambulatory Visit: Payer: Self-pay | Admitting: Internal Medicine

## 2018-10-12 ENCOUNTER — Ambulatory Visit (HOSPITAL_COMMUNITY): Payer: Medicare Other | Attending: Cardiology

## 2018-10-12 ENCOUNTER — Other Ambulatory Visit: Payer: Self-pay

## 2018-10-12 DIAGNOSIS — R609 Edema, unspecified: Secondary | ICD-10-CM | POA: Diagnosis present

## 2018-10-12 DIAGNOSIS — I509 Heart failure, unspecified: Secondary | ICD-10-CM | POA: Insufficient documentation

## 2018-10-12 DIAGNOSIS — Z0001 Encounter for general adult medical examination with abnormal findings: Secondary | ICD-10-CM | POA: Diagnosis present

## 2018-10-12 DIAGNOSIS — E119 Type 2 diabetes mellitus without complications: Secondary | ICD-10-CM | POA: Diagnosis present

## 2018-10-20 ENCOUNTER — Other Ambulatory Visit: Payer: Self-pay | Admitting: Internal Medicine

## 2018-10-20 NOTE — Telephone Encounter (Signed)
Copied from [place] [id number]. Topic: Quick Communication - Rx Refill/Question >> Oct 20, 2018 11:30 AM Reyne Dumas L wrote: Medication: fosinopril (MONOPRIL) 20 MG tablet  Has the patient contacted their pharmacy? Yes - states no refills on file for this one.  Pt states she only has enough for two days left. (Agent: If no, request that the patient contact the pharmacy for the refill.) (Agent: If yes, when and what did the pharmacy advise?)  Preferred Pharmacy (with phone number or street name): [facility] [id number] - [place], [place] - [phone] (Phone) [phone] (Fax)  Agent: Please be advised that RX refills may take up to 3 business days. We ask that you follow-up with your pharmacy.

## 2018-10-20 NOTE — Telephone Encounter (Signed)
Spoke with Ronalee Belts at [facility] who states that the pt does have a refill of Fosinopril on file that can be filled for the pt today.

## 2018-12-15 ENCOUNTER — Encounter: Payer: Self-pay | Admitting: Internal Medicine

## 2019-01-01 ENCOUNTER — Other Ambulatory Visit: Payer: Self-pay | Admitting: Internal Medicine

## 2019-03-01 ENCOUNTER — Telehealth: Payer: Self-pay | Admitting: Internal Medicine

## 2019-03-01 NOTE — Telephone Encounter (Signed)
Ok with me, thanks.

## 2019-03-01 NOTE — Telephone Encounter (Signed)
Patient called stating that she would like to turn 6/1 visit into a phone visit.  Please advise.

## 2019-03-01 NOTE — Telephone Encounter (Signed)
Pt informed of below.  

## 2019-03-04 ENCOUNTER — Encounter: Payer: Self-pay | Admitting: Internal Medicine

## 2019-03-15 ENCOUNTER — Ambulatory Visit (INDEPENDENT_AMBULATORY_CARE_PROVIDER_SITE_OTHER): Payer: Medicare Other | Admitting: Internal Medicine

## 2019-03-15 ENCOUNTER — Encounter: Payer: Self-pay | Admitting: Internal Medicine

## 2019-03-15 DIAGNOSIS — E119 Type 2 diabetes mellitus without complications: Secondary | ICD-10-CM

## 2019-03-15 DIAGNOSIS — E559 Vitamin D deficiency, unspecified: Secondary | ICD-10-CM

## 2019-03-15 DIAGNOSIS — E611 Iron deficiency: Secondary | ICD-10-CM

## 2019-03-15 DIAGNOSIS — E538 Deficiency of other specified B group vitamins: Secondary | ICD-10-CM

## 2019-03-15 DIAGNOSIS — I10 Essential (primary) hypertension: Secondary | ICD-10-CM

## 2019-03-15 DIAGNOSIS — E785 Hyperlipidemia, unspecified: Secondary | ICD-10-CM

## 2019-03-15 DIAGNOSIS — Z0000 Encounter for general adult medical examination without abnormal findings: Secondary | ICD-10-CM

## 2019-03-15 MED ORDER — ZOSTER VAC RECOMB ADJUVANTED 50 MCG/0.5ML IM SUSR: 0.5000 mL | Freq: Once | INTRAMUSCULAR | 1 refills | Status: AC

## 2019-03-15 MED ORDER — FOSINOPRIL SODIUM 20 MG PO TABS: 20.0000 mg | ORAL_TABLET | Freq: Every day | ORAL | 3 refills | Status: DC

## 2019-03-15 NOTE — Assessment & Plan Note (Signed)
See notes

## 2019-03-15 NOTE — Progress Notes (Signed)
Patient ID: Connie Marshall, female   DOB: 1946/02/28, 73 y.o.   MRN: [id number]  Phone visit  Cumulative time during 7-day interval 19 min, there was not an associated office visit for this concern within a 7 day period.  Verbal consent for services obtained from patient prior to services given.  Names of all persons present for services: [name], MD, patient  Chief complaint: f/u general medical  History, background, results pertinent: Here to f/u; overall doing ok,  Pt denies chest pain, increasing sob or doe, wheezing, orthopnea, PND, increased LE swelling, palpitations, dizziness or syncope.  Pt denies new neurological symptoms such as new headache, or facial or extremity weakness or numbness.  Pt denies polydipsia, polyuria, or low sugar episode.  Pt states overall good compliance with meds, mostly trying to follow appropriate diet, with wt overall stable,  but little exercise however. Pt reports VS today -Temp 97.2, HR 72, BP 126 /84 Past Medical History:  Diagnosis Date  . Diabetes mellitus    borderline/NO MEDS  . Diverticulosis   . Hemorrhoids   . Hyperlipidemia   . Hypertension   . IBS (irritable bowel syndrome)   . Obesity    No results found for this or any previous visit (from the past 48 hour(s)).  Current Outpatient Medications on File Prior to Visit  Medication Sig Dispense Refill  . clotrimazole-betamethasone (LOTRISONE) cream Use as directed 15 g 1  . diclofenac sodium (VOLTAREN) 1 % GEL Apply 2 g topically 4 (four) times daily as needed. 100 g 5  . flurandrenolide (CORDRAN) 0.05 % lotion     . furosemide (LASIX) 40 MG tablet 1 tab by mouth in the AM, and 1 in the PM as needed for leg swelling 180 tablet 3  . halobetasol (ULTRAVATE) 0.05 % ointment     . 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 simvastatin (ZOCOR) 20 MG tablet TAKE 1 TABLET BY MOUTH EVERY EVENING 30 tablet 11  . triamcinolone (KENALOG) 0.025 % ointment Apply 1 application  topically 2 (two) times daily.     No current facility-administered medications on file prior to visit.    Lab Results  Component Value Date   WBC 3.7 (L) 09/23/2018   HGB 14.2 09/23/2018   HCT 41.6 09/23/2018   PLT 245.0 09/23/2018   GLUCOSE 110 (H) 09/23/2018   CHOL 127 09/23/2018   TRIG 96.0 09/23/2018   HDL 42.90 09/23/2018   LDLDIRECT 115.2 09/26/2008   LDLCALC 65 09/23/2018   ALT 18 09/23/2018   AST 21 09/23/2018   NA 139 09/23/2018   K 3.9 09/23/2018   CL 103 09/23/2018   CREATININE 0.82 09/23/2018   BUN 13 09/23/2018   CO2 29 09/23/2018   TSH 1.28 09/23/2018   HGBA1C 6.4 09/23/2018   MICROALBUR 1.0 09/23/2018   A/P/next steps:   DM - stable, for a1c with labs  HTN - pt encouraged to follow BP at home with goal at least < 14090  HLD - cont DM diet, for f/u lipids with next labs  [name] MD

## 2019-03-15 NOTE — Patient Instructions (Signed)
Your shingles shot prescription was sent to [facility]  Please continue all other medications as before, and refills have been done if requested - the fosinopril  Please have the pharmacy call with any other refills you may need.  Please continue your efforts at being more active, low cholesterol diet, and weight control.  Please keep your appointments with your specialists as you may have planned  Please go to the LAB in the Basement (turn left off the elevator) for the tests to be done at your convenience  You will be contacted by phone if any changes need to be made immediately.  Otherwise, you will receive a letter about your results with an explanation, but please check with MyChart first.  Please remember to sign up for MyChart if you have not done so, as this will be important to you in the future with finding out test results, communicating by private email, and scheduling acute appointments online when needed.  Please return in 6 months, or sooner if needed, with Lab testing done 3-5 days before

## 2019-04-02 ENCOUNTER — Other Ambulatory Visit: Payer: Self-pay | Admitting: Internal Medicine

## 2019-04-02 ENCOUNTER — Telehealth: Payer: Self-pay

## 2019-04-02 ENCOUNTER — Other Ambulatory Visit (INDEPENDENT_AMBULATORY_CARE_PROVIDER_SITE_OTHER): Payer: Medicare Other

## 2019-04-02 DIAGNOSIS — E538 Deficiency of other specified B group vitamins: Secondary | ICD-10-CM | POA: Diagnosis not present

## 2019-04-02 DIAGNOSIS — E119 Type 2 diabetes mellitus without complications: Secondary | ICD-10-CM

## 2019-04-02 DIAGNOSIS — E611 Iron deficiency: Secondary | ICD-10-CM | POA: Diagnosis not present

## 2019-04-02 DIAGNOSIS — E559 Vitamin D deficiency, unspecified: Secondary | ICD-10-CM | POA: Diagnosis not present

## 2019-04-02 LAB — BASIC METABOLIC PANEL
BUN: 15 mg/dL (ref 6–23)
CO2: 27 mEq/L (ref 19–32)
Calcium: 9.9 mg/dL (ref 8.4–10.5)
Chloride: 105 mEq/L (ref 96–112)
Creatinine, Ser: 0.82 mg/dL (ref 0.40–1.20)
GFR: 82.65 mL/min (ref >60.00)
Glucose, Bld: 118 mg/dL — ABNORMAL HIGH (ref 70–99)
Potassium: 4.1 mEq/L (ref 3.5–5.1)
Sodium: 140 mEq/L (ref 135–145)

## 2019-04-02 LAB — IBC PANEL
Iron: 121 ug/dL (ref 42–145)
Saturation Ratios: 35.6 % (ref 20.0–50.0)
Transferrin: 243 mg/dL (ref 212.0–360.0)

## 2019-04-02 LAB — LIPID PANEL
Cholesterol: 133 mg/dL (ref 0–200)
HDL: 39.6 mg/dL (ref >39.00)
LDL Cholesterol: 64 mg/dL (ref 0–99)
NonHDL: 93.14
Total CHOL/HDL Ratio: 3
Triglycerides: 145 mg/dL (ref 0.0–149.0)
VLDL: 29 mg/dL (ref 0.0–40.0)

## 2019-04-02 LAB — HEPATIC FUNCTION PANEL
ALT: 11 U/L (ref 0–35)
AST: 12 U/L (ref 0–37)
Albumin: 4.3 g/dL (ref 3.5–5.2)
Alkaline Phosphatase: 52 U/L (ref 39–117)
Bilirubin, Direct: 0.1 mg/dL (ref 0.0–0.3)
Total Bilirubin: 0.5 mg/dL (ref 0.2–1.2)
Total Protein: 6.7 g/dL (ref 6.0–8.3)

## 2019-04-02 LAB — VITAMIN B12: Vitamin B-12: 181 pg/mL — ABNORMAL LOW (ref 211–911)

## 2019-04-02 LAB — VITAMIN D 25 HYDROXY (VIT D DEFICIENCY, FRACTURES): VITD: 18.67 ng/mL — ABNORMAL LOW (ref 30.00–100.00)

## 2019-04-02 LAB — HEMOGLOBIN A1C: Hgb A1c MFr Bld: 6.8 % — ABNORMAL HIGH (ref 4.6–6.5)

## 2019-04-02 MED ORDER — VITAMIN D (ERGOCALCIFEROL) 1.25 MG (50000 UNIT) PO CAPS: 50000.0000 [IU] | ORAL_CAPSULE | ORAL | 0 refills | Status: DC

## 2019-04-02 NOTE — Telephone Encounter (Signed)
-----   Message from [name], MD sent at 04/02/2019 12:19 PM EDT ----- Left message on MyChart, pt to cont same tx except  The test results show that your current treatment is OK, as the tests are quite good, except the Vitamin D level, and the Vitamin B12 levels are low.   We need to: 1) Please take Vitamin D 50000 units weekly for 12 weeks, then plan to change to OTC Vitamin D3 at 2000 units per day, indefinitely. 2)  Start monthly B12 shots, and at some point we may be able to change over to pills.Redmond Baseman to please inform pt, I will do rx, and please arrange monthly b12 shots

## 2019-04-02 NOTE — Telephone Encounter (Signed)
Pt has been informed of results and expressed understanding.  °

## 2019-04-12 ENCOUNTER — Ambulatory Visit (INDEPENDENT_AMBULATORY_CARE_PROVIDER_SITE_OTHER): Payer: Medicare Other

## 2019-04-12 DIAGNOSIS — E538 Deficiency of other specified B group vitamins: Secondary | ICD-10-CM

## 2019-04-12 MED ORDER — CYANOCOBALAMIN 1000 MCG/ML IJ SOLN
1000.0000 ug | Freq: Once | INTRAMUSCULAR | Status: AC
  Administered 2019-04-12: 1000 ug via INTRAMUSCULAR

## 2019-04-12 NOTE — Progress Notes (Signed)
Medical screening examination/treatment/procedure(s) were performed by non-physician practitioner and as supervising physician I was immediately available for consultation/collaboration. I agree with above. Dorance Spink, MD   

## 2019-05-10 ENCOUNTER — Ambulatory Visit (INDEPENDENT_AMBULATORY_CARE_PROVIDER_SITE_OTHER): Payer: Medicare Other

## 2019-05-10 DIAGNOSIS — E538 Deficiency of other specified B group vitamins: Secondary | ICD-10-CM

## 2019-05-10 MED ORDER — CYANOCOBALAMIN 1000 MCG/ML IJ SOLN
1000.0000 ug | Freq: Once | INTRAMUSCULAR | Status: AC
  Administered 2019-05-10: 1000 ug via INTRAMUSCULAR

## 2019-05-10 NOTE — Progress Notes (Signed)
Medical screening examination/treatment/procedure(s) were performed by non-physician practitioner and as supervising physician I was immediately available for consultation/collaboration. I agree with above. Storie Heffern, MD   

## 2019-05-11 ENCOUNTER — Other Ambulatory Visit: Payer: Self-pay

## 2019-05-11 ENCOUNTER — Ambulatory Visit (INDEPENDENT_AMBULATORY_CARE_PROVIDER_SITE_OTHER): Payer: Medicare Other | Admitting: Internal Medicine

## 2019-05-11 ENCOUNTER — Encounter: Payer: Self-pay | Admitting: Internal Medicine

## 2019-05-11 VITALS — BP 126/84 | HR 86 | Temp 98.0°F | Ht 67.0 in | Wt 256.0 lb

## 2019-05-11 DIAGNOSIS — E538 Deficiency of other specified B group vitamins: Secondary | ICD-10-CM | POA: Diagnosis not present

## 2019-05-11 DIAGNOSIS — I10 Essential (primary) hypertension: Secondary | ICD-10-CM

## 2019-05-11 DIAGNOSIS — E119 Type 2 diabetes mellitus without complications: Secondary | ICD-10-CM | POA: Diagnosis not present

## 2019-05-11 DIAGNOSIS — R229 Localized swelling, mass and lump, unspecified: Secondary | ICD-10-CM | POA: Insufficient documentation

## 2019-05-11 DIAGNOSIS — Z0000 Encounter for general adult medical examination without abnormal findings: Secondary | ICD-10-CM

## 2019-05-11 DIAGNOSIS — E559 Vitamin D deficiency, unspecified: Secondary | ICD-10-CM

## 2019-05-11 NOTE — Assessment & Plan Note (Signed)
Suspect large but likely benign mass near left axilla, has been enlarging however, for general surgury referral

## 2019-05-11 NOTE — Assessment & Plan Note (Signed)
For b12 replacement asd,  to f/u any worsening symptoms or concerns

## 2019-05-11 NOTE — Assessment & Plan Note (Signed)
stable overall by history and exam, recent data reviewed with pt, and pt to continue medical treatment as before,  to f/u any worsening symptoms or concerns  

## 2019-05-11 NOTE — Assessment & Plan Note (Signed)
For oral replacement,  to f/u any worsening symptoms or concerns 

## 2019-05-11 NOTE — Patient Instructions (Signed)
You will be contacted regarding the referral for: General surgury  Please continue all other medications as before, and refills have been done if requested.  Please have the pharmacy call with any other refills you may need.  Please continue your efforts at being more active, low cholesterol diet, and weight control.  You are otherwise up to date with prevention measures today.  Please keep your appointments with your specialists as you may have planned  /Please return in Dec 22, or sooner if needed, with Lab testing done 3-5 days before

## 2019-05-11 NOTE — Progress Notes (Signed)
Subjective:    Patient ID: Connie Marshall, female    DOB: 1946/04/02, 73 y.o.   MRN: [id number]  HPI  Here with c/o 2 mo worsening left lateral chest wall mass just below the axilla, gradually enlarging, minimally tender, no overlying skin change or rash, no fever or trauma or prior hx of same.  Recent mammogram neg.  Pt denies chest pain, increased sob or doe, wheezing, orthopnea, PND, increased LE swelling, palpitations, dizziness or syncope.  Pt denies new neurological symptoms such as new headache, or facial or extremity weakness or numbness   Pt denies polydipsia, polyuria Past Medical History:  Diagnosis Date  . Diabetes mellitus    borderline/NO MEDS  . Diverticulosis   . Hemorrhoids   . Hyperlipidemia   . Hypertension   . IBS (irritable bowel syndrome)   . Obesity    Past Surgical History:  Procedure Laterality Date  . CESAREAN SECTION     2 times  . CHOLECYSTECTOMY     laproscopic  . CHOLECYSTECTOMY, LAPAROSCOPIC    . kidney stone removed  2011  . THYROGLOSSAL DUCT CYST  2000  . TONSILLECTOMY  7654  . umbilical herniorrhapy    . VAGINAL HYSTERECTOMY  2003    reports that she has never smoked. She has never used smokeless tobacco. She reports that she does not drink alcohol or use drugs. family history includes Diabetes in her father; Hypertension in her father and mother; Parkinson's disease in her sister; Prostate cancer in her brother. Allergies  Allergen Reactions  . Tape     ALLERGIC TO NON-ALLERGIC TAPE-causes a rash   Current Outpatient Medications on File Prior to Visit  Medication Sig Dispense Refill  . clotrimazole-betamethasone (LOTRISONE) cream Use as directed 15 g 1  . diclofenac sodium (VOLTAREN) 1 % GEL Apply 2 g topically 4 (four) times daily as needed. 100 g 5  . flurandrenolide (CORDRAN) 0.05 % lotion     . fosinopril (MONOPRIL) 20 MG tablet Take 1 tablet (20 mg total) by mouth daily. 90 tablet 3  . furosemide (LASIX) 40 MG tablet 1 tab by mouth  in the AM, and 1 in the PM as needed for leg swelling 180 tablet 3  . halobetasol (ULTRAVATE) 0.05 % ointment     . 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 simvastatin (ZOCOR) 20 MG tablet TAKE 1 TABLET BY MOUTH EVERY EVENING 30 tablet 11  . triamcinolone (KENALOG) 0.025 % ointment Apply 1 application topically 2 (two) times daily.    . Vitamin D, Ergocalciferol, (DRISDOL) 1.25 MG (50000 UT) CAPS capsule Take 1 capsule (50,000 Units total) by mouth every 7 (seven) days. 12 capsule 0   No current facility-administered medications on file prior to visit.    Review of Systems  Constitutional: Negative for other unusual diaphoresis or sweats HENT: Negative for ear discharge or swelling Eyes: Negative for other worsening visual disturbances Respiratory: Negative for stridor or other swelling  Gastrointestinal: Negative for worsening distension or other blood Genitourinary: Negative for retention or other urinary change Musculoskeletal: Negative for other MSK pain or swelling Skin: Negative for color change or other new lesions Neurological: Negative for worsening tremors and other numbness  Psychiatric/Behavioral: Negative for worsening agitation or other fatigue All other system neg per pt    Objective:   Physical Exam BP 126/84   Pulse 86   Temp 98 F (36.7 C) (Oral)   Ht 5\' 7"  (1.702 m)  Wt 256 lb (116.1 kg)   SpO2 97%   BMI 40.10 kg/m  VS noted,  Constitutional: Pt appears in NAD HENT: Head: NCAT.  Right Ear: External ear normal.  Left Ear: External ear normal.  Eyes: . Pupils are equal, round, and reactive to light. Conjunctivae and EOM are normal Nose: without d/c or deformity Neck: Neck supple. Gross normal ROM Cardiovascular: Normal rate and regular rhythm.   Pulmonary/Chest: Effort normal and breath sounds without rales or wheezing. ; has approx 2 cm subq mass firm ? Cystic like nontender without overlying skin change or rash just below the left  axilla; no other axillary LA or mass noted and none to neck Abd:  Soft, NT, ND, + BS, no organomegaly Neurological: Pt is alert. At baseline orientation, motor grossly intact Skin: Skin is warm. No rashes, other new lesions, no LE edema Psychiatric: Pt behavior is normal without agitation  No other exam findings Lab Results  Component Value Date   WBC 3.7 (L) 09/23/2018   HGB 14.2 09/23/2018   HCT 41.6 09/23/2018   PLT 245.0 09/23/2018   GLUCOSE 118 (H) 04/02/2019   CHOL 133 04/02/2019   TRIG 145.0 04/02/2019   HDL 39.60 04/02/2019   LDLDIRECT 115.2 09/26/2008   LDLCALC 64 04/02/2019   ALT 11 04/02/2019   AST 12 04/02/2019   NA 140 04/02/2019   K 4.1 04/02/2019   CL 105 04/02/2019   CREATININE 0.82 04/02/2019   BUN 15 04/02/2019   CO2 27 04/02/2019   TSH 1.28 09/23/2018   HGBA1C 6.8 (H) 04/02/2019   MICROALBUR 1.0 09/23/2018       Assessment & Plan:

## 2019-06-07 ENCOUNTER — Ambulatory Visit (INDEPENDENT_AMBULATORY_CARE_PROVIDER_SITE_OTHER): Payer: Medicare Other

## 2019-06-07 DIAGNOSIS — E538 Deficiency of other specified B group vitamins: Secondary | ICD-10-CM

## 2019-06-07 MED ORDER — CYANOCOBALAMIN 1000 MCG/ML IJ SOLN
1000.0000 ug | Freq: Once | INTRAMUSCULAR | Status: AC
  Administered 2019-06-07: 1000 ug via INTRAMUSCULAR

## 2019-06-08 NOTE — Progress Notes (Signed)
Medical screening examination/treatment/procedure(s) were performed by non-physician practitioner and as supervising physician I was immediately available for consultation/collaboration. I agree with above. Yarianna Varble, MD   

## 2019-06-17 ENCOUNTER — Encounter: Payer: Self-pay | Admitting: Internal Medicine

## 2019-06-18 ENCOUNTER — Encounter: Payer: Self-pay | Admitting: Internal Medicine

## 2019-06-18 NOTE — Telephone Encounter (Signed)
PCCs to see pt concern about refrral

## 2019-06-29 ENCOUNTER — Other Ambulatory Visit: Payer: Self-pay | Admitting: Surgery

## 2019-07-05 ENCOUNTER — Other Ambulatory Visit: Payer: Self-pay

## 2019-07-05 ENCOUNTER — Ambulatory Visit (INDEPENDENT_AMBULATORY_CARE_PROVIDER_SITE_OTHER): Payer: Medicare Other

## 2019-07-05 ENCOUNTER — Encounter (HOSPITAL_BASED_OUTPATIENT_CLINIC_OR_DEPARTMENT_OTHER): Payer: Self-pay | Admitting: *Deleted

## 2019-07-05 DIAGNOSIS — Z23 Encounter for immunization: Secondary | ICD-10-CM | POA: Diagnosis not present

## 2019-07-05 DIAGNOSIS — E538 Deficiency of other specified B group vitamins: Secondary | ICD-10-CM

## 2019-07-05 DIAGNOSIS — M810 Age-related osteoporosis without current pathological fracture: Secondary | ICD-10-CM

## 2019-07-05 MED ORDER — CYANOCOBALAMIN 1000 MCG/ML IJ SOLN
1000.0000 ug | Freq: Once | INTRAMUSCULAR | Status: AC
  Administered 2019-07-05: 14:00:00 1000 ug via INTRAMUSCULAR

## 2019-07-05 MED ORDER — DENOSUMAB 60 MG/ML ~~LOC~~ SOSY: 60.0000 mg | PREFILLED_SYRINGE | Freq: Once | SUBCUTANEOUS | Status: DC

## 2019-07-05 NOTE — Addendum Note (Signed)
Addended by: Ander Slade on: 07/05/2019 01:52 PM   Modules accepted: Orders

## 2019-07-05 NOTE — Progress Notes (Signed)
Medical screening examination/treatment/procedure(s) were performed by non-physician practitioner and as supervising physician I was immediately available for consultation/collaboration. I agree with above. James John, MD   

## 2019-07-08 ENCOUNTER — Other Ambulatory Visit: Payer: Self-pay

## 2019-07-08 ENCOUNTER — Encounter (HOSPITAL_BASED_OUTPATIENT_CLINIC_OR_DEPARTMENT_OTHER)
Admission: RE | Admit: 2019-07-08 | Discharge: 2019-07-08 | Disposition: A | Discharge status: Home / Self Care | Payer: Medicare Other | Source: Ambulatory Visit | Attending: Surgery | Admitting: Surgery

## 2019-07-08 ENCOUNTER — Other Ambulatory Visit (HOSPITAL_COMMUNITY)
Admission: RE | Admit: 2019-07-08 | Discharge: 2019-07-08 | Disposition: A | Discharge status: Home / Self Care | Payer: Medicare Other | Source: Ambulatory Visit | Attending: Surgery | Admitting: Surgery

## 2019-07-08 ENCOUNTER — Encounter: Payer: Self-pay | Admitting: Internal Medicine

## 2019-07-08 DIAGNOSIS — Z01812 Encounter for preprocedural laboratory examination: Secondary | ICD-10-CM | POA: Diagnosis not present

## 2019-07-08 DIAGNOSIS — Z20828 Contact with and (suspected) exposure to other viral communicable diseases: Secondary | ICD-10-CM | POA: Diagnosis not present

## 2019-07-08 LAB — BASIC METABOLIC PANEL
Anion gap: 10 (ref 5–15)
BUN: 10 mg/dL (ref 8–23)
CO2: 24 mmol/L (ref 22–32)
Calcium: 9.8 mg/dL (ref 8.9–10.3)
Chloride: 105 mmol/L (ref 98–111)
Creatinine, Ser: 0.88 mg/dL (ref 0.44–1.00)
GFR calc Af Amer: >60 mL/min (ref >60)
GFR calc non Af Amer: >60 mL/min (ref >60)
Glucose, Bld: 145 mg/dL — ABNORMAL HIGH (ref 70–99)
Potassium: 3.9 mmol/L (ref 3.5–5.1)
Sodium: 139 mmol/L (ref 135–145)

## 2019-07-08 NOTE — Progress Notes (Addendum)

## 2019-07-09 ENCOUNTER — Other Ambulatory Visit (HOSPITAL_COMMUNITY): Payer: Medicare Other

## 2019-07-09 LAB — NOVEL CORONAVIRUS, NAA (HOSP ORDER, SEND-OUT TO REF LAB; TAT 18-24 HRS): SARS-CoV-2, NAA: NOT DETECTED

## 2019-07-11 NOTE — H&P (Signed)
Connie Marshall Documented: 06/29/2019 11:14 AM Location: [facility] Patient #: [id number] DOB: Apr 16, 1946 Undefined / Language: [name] / Race: Black or African American Female   History of Present Illness (Connie Marshall A. Ninfa Linden MD; 06/29/2019 11:26 AM) The patient is a 73 year old female who presents with a complaint of Mass. This is a pleasant 73 year old female referred by Dr. Cathlean Marshall for evaluation of an enlarging left axillary mass. She is uncertain of how long the mass has been present but it is getting larger and causing occasional burning discomfort. She had normal mammograms earlier this summer. She reports she has had previous infections in her left axilla in the distant past from cysts. She has had no recent drainage or wounds in the axilla. She denies fever, chills, or night sweats.   Past Surgical History Connie Marshall, CMA; 06/29/2019 11:14 AM) Cesarean Section - Multiple  Colon Polyp Removal - Colonoscopy  Gallbladder Surgery - Laparoscopic  Hysterectomy (not due to cancer) - Complete  Thyroid Surgery  Tonsillectomy  Ventral / Umbilical Hernia Surgery  Bilateral.  Diagnostic Studies History Connie Marshall, CMA; 06/29/2019 11:14 AM) Colonoscopy  1-5 years ago Mammogram  within last year Pap Smear  >5 years ago  Allergies Connie Marshall, CMA; 06/29/2019 11:15 AM) Adhesive Tape   Medication History Connie Marshall, CMA; 06/29/2019 11:15 AM) Fosinopril Sodium (20MG  Tablet, Oral) Active. Furosemide (40MG  Tablet, Oral) Active. Simvastatin (20MG  Tablet, Oral) Active. Medications Reconciled  Social History Connie Marshall, CMA; 06/29/2019 11:14 AM) Alcohol use  Remotely quit alcohol use. Caffeine use  Carbonated beverages. No drug use  Tobacco use  Never smoker.  Family History Connie Marshall, CMA; 06/29/2019 11:14 AM) Arthritis  Mother, Sister. Cancer  Daughter. Depression  Sister. Diabetes Mellitus  Father, Sister, Son. Heart  Disease  Father. Hypertension  Father, Mother, Sister, Son. Migraine Headache  Daughter. Prostate Cancer  Brother.  Pregnancy / Birth History Connie Marshall, CMA; 06/29/2019 11:14 AM) Age at menarche  29 years. Age of menopause  51-55 Contraceptive History  Oral contraceptives. Gravida  2 Maternal age  90-30 Para  2  Other Problems Connie Marshall, CMA; 06/29/2019 11:14 AM) Bladder Problems  Cholelithiasis  Diabetes Mellitus  Diverticulosis  General anesthesia - complications  Hemorrhoids  High blood pressure  Hypercholesterolemia  Kidney Stone  Oophorectomy  Bilateral. Other disease, cancer, significant illness  Thyroid Disease  Umbilical Hernia Repair     Review of Systems (Connie Marshall CMA; 06/29/2019 11:14 AM) General Not Present- Appetite Loss, Chills, Fatigue, Fever, Night Sweats, Weight Gain and Weight Loss. Skin Present- Rash. Not Present- Change in Wart/Mole, Dryness, Hives, Jaundice, New Lesions, Non-Healing Wounds and Ulcer. HEENT Present- Wears glasses/contact lenses. Not Present- Earache, Hearing Loss, Hoarseness, Nose Bleed, Oral Ulcers, Ringing in the Ears, Seasonal Allergies, Sinus Pain, Sore Throat, Visual Disturbances and Yellow Eyes. Respiratory Not Present- Bloody sputum, Chronic Cough, Difficulty Breathing, Snoring and Wheezing. Breast Not Present- Breast Mass, Breast Pain, Nipple Discharge and Skin Changes. Cardiovascular Present- Swelling of Extremities. Not Present- Chest Pain, Difficulty Breathing Lying Down, Leg Cramps, Palpitations, Rapid Heart Rate and Shortness of Breath. Gastrointestinal Present- Constipation and Hemorrhoids. Not Present- Abdominal Pain, Bloating, Bloody Stool, Change in Bowel Habits, Chronic diarrhea, Difficulty Swallowing, Excessive gas, Gets full quickly at meals, Indigestion, Nausea, Rectal Pain and Vomiting. Female Genitourinary Present- Frequency. Not Present- Nocturia, Painful Urination, Pelvic Pain and  Urgency. Musculoskeletal Present- Joint Pain, Joint Stiffness and Muscle Pain. Not Present- Back Pain, Muscle Weakness and Swelling of Extremities. Neurological Present-  Trouble walking and Weakness. Not Present- Decreased Memory, Fainting, Headaches, Numbness, Seizures, Tingling and Tremor. Psychiatric Not Present- Anxiety, Bipolar, Change in Sleep Pattern, Depression, Fearful and Frequent crying. Endocrine Not Present- Cold Intolerance, Excessive Hunger, Hair Changes, Heat Intolerance, Hot flashes and New Diabetes. Hematology Not Present- Blood Thinners, Easy Bruising, Excessive bleeding, Gland problems, HIV and Persistent Infections.  Vitals (Connie Marshall CMA; 06/29/2019 11:14 AM) 06/29/2019 11:14 AM Weight: 252.2 lb Height: 67in Body Surface Area: 2.23 m Body Mass Index: 39.5 kg/m  Pulse: 84 (Regular)  BP: 140/80(Sitting, Left Arm, Standard)       Physical Exam (Connie Marshall A. Ninfa Linden MD; 06/29/2019 11:26 AM) General Mental Status-Alert. General Appearance-Consistent with stated age. Hydration-Well hydrated. Voice-Normal.  Head and Neck Head-normocephalic, atraumatic with no lesions or palpable masses. Trachea-midline. Thyroid Gland Characteristics - normal size and consistency.  Eye Eyeball - Bilateral-Extraocular movements intact. Sclera/Conjunctiva - Bilateral-No scleral icterus.  Chest and Lung Exam Chest and lung exam reveals -quiet, even and easy respiratory effort with no use of accessory muscles and on auscultation, normal breath sounds, no adventitious sounds and normal vocal resonance. Inspection Chest Wall - Normal. Back - normal.  Breast - Did not examine.  Cardiovascular Cardiovascular examination reveals -normal heart sounds, regular rate and rhythm with no murmurs and normal pedal pulses bilaterally.  Abdomen - Did not examine.  Neurologic Neurologic evaluation reveals -alert and oriented x 3 with no impairment of recent  or remote memory. Mental Status-Normal.  Musculoskeletal Normal Exam - Left-Upper Extremity Strength Normal and Lower Extremity Strength Normal. Normal Exam - Right-Upper Extremity Strength Normal and Lower Extremity Strength Normal.  Lymphatic Head & Neck  General Head & Neck Lymphatics: Bilateral - Description - Normal. Axillary  General Axillary Region: Bilateral - Description - Normal. Tenderness - Non Tender. Femoral & Inguinal - Did not examine. Note: There is a large, 5-6 cm mass which feels fairly superficial but quite firm in the left axilla. It is moderately mobile. There are no skin changes.     Assessment & Plan (Charla Criscione A. Ninfa Linden MD; 06/29/2019 11:27 AM)  AXILLARY MASS, LEFT (R22.32) Impression: This is a patient with a large symptomatic axillary mass on the left side of uncertain etiology. This may represent an enlarging lymph node. It also could represent prior infection with inflammatory changes or a desmoid tumor. Complete surgical excision is recommended for histologic evaluation to rule out a malignancy. I discussed this with her in detail and she is here to proceed with surgery. We discussed the risk which includes but is not limited to bleeding, infection, recurrence, the need for further procedures if malignancy is found, postoperative seroma formation, cardiopulmonary issues, etc. Again, she agrees to proceed with surgery

## 2019-07-11 NOTE — Anesthesia Preprocedure Evaluation (Addendum)
Anesthesia Evaluation  Patient identified by MRN, date of birth, ID band Patient awake    Reviewed: Allergy & Precautions, NPO status , Patient's Chart, lab work & pertinent test results  History of Anesthesia Complications (+) PONV and history of anesthetic complications  Airway Mallampati: II  TM Distance: >3 FB Neck ROM: Full    Dental  (+) Dental Advisory Given   Pulmonary neg pulmonary ROS,    Pulmonary exam normal breath sounds clear to auscultation       Cardiovascular hypertension, Pt. on medications +CHF  Normal cardiovascular exam Rhythm:Regular Rate:Normal  Echo 10/12/2018 - Left ventricle: The cavity size was normal. Wall thickness was  increased in a pattern of mild LVH. There was mild focal basal hypertrophy of the septum. Systolic function was normal. The estimated ejection fraction was in the range of 55% to 60%. Wall  motion was normal; there were no regional wall motion  abnormalities. Doppler parameters are consistent with abnormal  left ventricular relaxation (grade 1 diastolic dysfunction). - Atrial septum: There was increased thickness of the septum, consistent with lipomatous hypertrophy.  Impressions: - Normal LV systolic function; mild diastolic dysfunction; mild LVH with proximal septal thickening.   Neuro/Psych PSYCHIATRIC DISORDERS Anxiety negative neurological ROS     GI/Hepatic negative GI ROS, Neg liver ROS,   Endo/Other  diabetesMorbid obesity  Renal/GU negative Renal ROS     Musculoskeletal negative musculoskeletal ROS (+)   Abdominal (+) + obese,   Peds  Hematology negative hematology ROS (+)   Anesthesia Other Findings   Reproductive/Obstetrics                           Anesthesia Physical Anesthesia Plan  ASA: III  Anesthesia Plan: General   Post-op Pain Management:    Induction: Intravenous  PONV Risk Score and Plan: 4 or greater and  Ondansetron, Dexamethasone and Treatment may vary due to age or medical condition  Airway Management Planned: LMA  Additional Equipment: None  Intra-op Plan:   Post-operative Plan: Extubation in OR  Informed Consent: I have reviewed the patients History and Physical, chart, labs and discussed the procedure including the risks, benefits and alternatives for the proposed anesthesia with the patient or authorized representative who has indicated his/her understanding and acceptance.     Dental advisory given  Plan Discussed with: CRNA  Anesthesia Plan Comments:         Anesthesia Quick Evaluation

## 2019-07-12 ENCOUNTER — Ambulatory Visit (HOSPITAL_BASED_OUTPATIENT_CLINIC_OR_DEPARTMENT_OTHER): Payer: Medicare Other | Admitting: Certified Registered"

## 2019-07-12 ENCOUNTER — Ambulatory Visit (HOSPITAL_BASED_OUTPATIENT_CLINIC_OR_DEPARTMENT_OTHER)
Admission: RE | Admit: 2019-07-12 | Discharge: 2019-07-12 | Disposition: A | Discharge status: Home / Self Care | Payer: Medicare Other | Attending: Surgery | Admitting: Surgery

## 2019-07-12 ENCOUNTER — Encounter (HOSPITAL_BASED_OUTPATIENT_CLINIC_OR_DEPARTMENT_OTHER): Payer: Self-pay | Admitting: Anesthesiology

## 2019-07-12 ENCOUNTER — Other Ambulatory Visit: Payer: Self-pay

## 2019-07-12 ENCOUNTER — Encounter (HOSPITAL_BASED_OUTPATIENT_CLINIC_OR_DEPARTMENT_OTHER)
Admission: RE | Disposition: A | Discharge status: Home / Self Care | Payer: Self-pay | Source: Home / Self Care | Attending: Surgery

## 2019-07-12 DIAGNOSIS — Z6836 Body mass index (BMI) 36.0-36.9, adult: Secondary | ICD-10-CM | POA: Insufficient documentation

## 2019-07-12 DIAGNOSIS — C50912 Malignant neoplasm of unspecified site of left female breast: Secondary | ICD-10-CM | POA: Diagnosis present

## 2019-07-12 DIAGNOSIS — E7800 Pure hypercholesterolemia, unspecified: Secondary | ICD-10-CM | POA: Diagnosis not present

## 2019-07-12 DIAGNOSIS — I110 Hypertensive heart disease with heart failure: Secondary | ICD-10-CM | POA: Diagnosis not present

## 2019-07-12 DIAGNOSIS — E119 Type 2 diabetes mellitus without complications: Secondary | ICD-10-CM | POA: Insufficient documentation

## 2019-07-12 DIAGNOSIS — I509 Heart failure, unspecified: Secondary | ICD-10-CM | POA: Diagnosis not present

## 2019-07-12 HISTORY — DX: Other complications of anesthesia, initial encounter: T88.59XA

## 2019-07-12 HISTORY — PX: MASS EXCISION: SHX2000

## 2019-07-12 HISTORY — DX: Nausea with vomiting, unspecified: R11.2

## 2019-07-12 HISTORY — DX: Localized swelling, mass and lump, left upper limb: R22.32

## 2019-07-12 HISTORY — DX: Other specified postprocedural states: Z98.890

## 2019-07-12 HISTORY — DX: Prediabetes: R73.03

## 2019-07-12 SURGERY — EXCISION MASS
Anesthesia: General | Site: Axilla | Laterality: Left

## 2019-07-12 MED ORDER — SCOPOLAMINE 1 MG/3DAYS TD PT72: 1.0000 | MEDICATED_PATCH | Freq: Once | TRANSDERMAL | Status: DC

## 2019-07-12 MED ORDER — FENTANYL CITRATE (PF) 100 MCG/2ML IJ SOLN
INTRAMUSCULAR | Status: AC
  Filled 2019-07-12: qty 2

## 2019-07-12 MED ORDER — DEXAMETHASONE SODIUM PHOSPHATE 10 MG/ML IJ SOLN
INTRAMUSCULAR | Status: AC
  Filled 2019-07-12: qty 1

## 2019-07-12 MED ORDER — ONDANSETRON HCL 4 MG/2ML IJ SOLN
INTRAMUSCULAR | Status: AC
  Filled 2019-07-12: qty 2

## 2019-07-12 MED ORDER — CHLORHEXIDINE GLUCONATE CLOTH 2 % EX PADS: 6.0000 | MEDICATED_PAD | Freq: Once | CUTANEOUS | Status: DC

## 2019-07-12 MED ORDER — CEFAZOLIN SODIUM-DEXTROSE 2-4 GM/100ML-% IV SOLN
2.0000 g | INTRAVENOUS | Status: AC
  Administered 2019-07-12: 2 g via INTRAVENOUS

## 2019-07-12 MED ORDER — ONDANSETRON HCL 4 MG/2ML IJ SOLN
INTRAMUSCULAR | Status: DC | PRN
  Administered 2019-07-12: 4 mg via INTRAVENOUS

## 2019-07-12 MED ORDER — ACETAMINOPHEN 500 MG PO TABS
ORAL_TABLET | ORAL | Status: AC
  Filled 2019-07-12: qty 2

## 2019-07-12 MED ORDER — LIDOCAINE HCL (CARDIAC) PF 100 MG/5ML IV SOSY
PREFILLED_SYRINGE | INTRAVENOUS | Status: DC | PRN
  Administered 2019-07-12: 60 mg via INTRAVENOUS

## 2019-07-12 MED ORDER — PROPOFOL 10 MG/ML IV BOLUS
INTRAVENOUS | Status: DC | PRN
  Administered 2019-07-12: 150 mg via INTRAVENOUS

## 2019-07-12 MED ORDER — OXYCODONE HCL 5 MG PO TABS: 5.0000 mg | ORAL_TABLET | Freq: Once | ORAL | Status: DC | PRN

## 2019-07-12 MED ORDER — FENTANYL CITRATE (PF) 100 MCG/2ML IJ SOLN
50.0000 ug | INTRAMUSCULAR | Status: AC | PRN
  Administered 2019-07-12: 25 ug via INTRAVENOUS
  Administered 2019-07-12 (×2): 50 ug via INTRAVENOUS

## 2019-07-12 MED ORDER — GABAPENTIN 300 MG PO CAPS
300.0000 mg | ORAL_CAPSULE | ORAL | Status: AC
  Administered 2019-07-12: 300 mg via ORAL

## 2019-07-12 MED ORDER — LACTATED RINGERS IV SOLN
INTRAVENOUS | Status: DC
  Administered 2019-07-12: 08:00:00 via INTRAVENOUS

## 2019-07-12 MED ORDER — BUPIVACAINE-EPINEPHRINE 0.5% -1:200000 IJ SOLN
INTRAMUSCULAR | Status: DC | PRN
  Administered 2019-07-12: 17 mL

## 2019-07-12 MED ORDER — CEFAZOLIN SODIUM-DEXTROSE 2-4 GM/100ML-% IV SOLN
INTRAVENOUS | Status: AC
  Filled 2019-07-12: qty 100

## 2019-07-12 MED ORDER — FENTANYL CITRATE (PF) 100 MCG/2ML IJ SOLN: 25.0000 ug | INTRAMUSCULAR | Status: DC | PRN

## 2019-07-12 MED ORDER — TRAMADOL HCL 50 MG PO TABS: 50.0000 mg | ORAL_TABLET | Freq: Four times a day (QID) | ORAL | 0 refills | Status: DC | PRN

## 2019-07-12 MED ORDER — GABAPENTIN 300 MG PO CAPS
ORAL_CAPSULE | ORAL | Status: AC
  Filled 2019-07-12: qty 1

## 2019-07-12 MED ORDER — PROPOFOL 500 MG/50ML IV EMUL
INTRAVENOUS | Status: AC
  Filled 2019-07-12: qty 50

## 2019-07-12 MED ORDER — ACETAMINOPHEN 500 MG PO TABS
1000.0000 mg | ORAL_TABLET | ORAL | Status: AC
  Administered 2019-07-12: 1000 mg via ORAL

## 2019-07-12 MED ORDER — EPHEDRINE SULFATE 50 MG/ML IJ SOLN
INTRAMUSCULAR | Status: DC | PRN
  Administered 2019-07-12 (×2): 10 mg via INTRAVENOUS

## 2019-07-12 MED ORDER — MIDAZOLAM HCL 2 MG/2ML IJ SOLN: 1.0000 mg | INTRAMUSCULAR | Status: DC | PRN

## 2019-07-12 MED ORDER — OXYCODONE HCL 5 MG/5ML PO SOLN: 5.0000 mg | Freq: Once | ORAL | Status: DC | PRN

## 2019-07-12 MED ORDER — DEXAMETHASONE SODIUM PHOSPHATE 4 MG/ML IJ SOLN
INTRAMUSCULAR | Status: DC | PRN
  Administered 2019-07-12: 5 mg via INTRAVENOUS

## 2019-07-12 MED ORDER — ONDANSETRON HCL 4 MG/2ML IJ SOLN
4.0000 mg | Freq: Once | INTRAMUSCULAR | Status: AC
  Administered 2019-07-12: 4 mg via INTRAVENOUS

## 2019-07-12 MED ORDER — MEPERIDINE HCL 25 MG/ML IJ SOLN: 6.2500 mg | INTRAMUSCULAR | Status: DC | PRN

## 2019-07-12 SURGICAL SUPPLY — 42 items
ADH SKN CLS APL DERMABOND .7 (GAUZE/BANDAGES/DRESSINGS) ×1
APL PRP STRL LF DISP 70% ISPRP (MISCELLANEOUS) ×1
BLADE HEX COATED 2.75 (ELECTRODE) ×2 IMPLANT
BLADE SURG 15 STRL LF DISP TIS (BLADE) ×1 IMPLANT
BLADE SURG 15 STRL SS (BLADE) ×2
CANISTER SUCT 1200ML W/VALVE (MISCELLANEOUS) IMPLANT
CHLORAPREP W/TINT 26 (MISCELLANEOUS) ×2 IMPLANT
CLIP VESOCCLUDE SM WIDE 6/CT (CLIP) IMPLANT
COVER BACK TABLE REUSABLE LG (DRAPES) ×2 IMPLANT
COVER MAYO STAND REUSABLE (DRAPES) ×2 IMPLANT
COVER WAND RF STERILE (DRAPES) IMPLANT
DECANTER SPIKE VIAL GLASS SM (MISCELLANEOUS) ×1 IMPLANT
DERMABOND ADVANCED (GAUZE/BANDAGES/DRESSINGS) ×1
DERMABOND ADVANCED .7 DNX12 (GAUZE/BANDAGES/DRESSINGS) ×1 IMPLANT
DRAPE LAPAROTOMY 100X72 PEDS (DRAPES) ×2 IMPLANT
DRAPE UTILITY XL STRL (DRAPES) ×2 IMPLANT
ELECT REM PT RETURN 9FT ADLT (ELECTROSURGICAL) ×2
ELECTRODE REM PT RTRN 9FT ADLT (ELECTROSURGICAL) ×1 IMPLANT
GAUZE SPONGE 4X4 12PLY STRL LF (GAUZE/BANDAGES/DRESSINGS) ×2 IMPLANT
GLOVE BIO SURGEON STRL SZ 6.5 (GLOVE) ×2 IMPLANT
GLOVE BIOGEL PI IND STRL 6.5 (GLOVE) IMPLANT
GLOVE BIOGEL PI INDICATOR 6.5 (GLOVE) ×3
GLOVE SURG SIGNA 7.5 PF LTX (GLOVE) ×2 IMPLANT
GOWN STRL REUS W/ TWL LRG LVL3 (GOWN DISPOSABLE) ×1 IMPLANT
GOWN STRL REUS W/ TWL XL LVL3 (GOWN DISPOSABLE) ×1 IMPLANT
GOWN STRL REUS W/TWL LRG LVL3 (GOWN DISPOSABLE) ×4
GOWN STRL REUS W/TWL XL LVL3 (GOWN DISPOSABLE) ×2
NDL HYPO 25X1 1.5 SAFETY (NEEDLE) ×1 IMPLANT
NEEDLE HYPO 25X1 1.5 SAFETY (NEEDLE) ×2 IMPLANT
NS IRRIG 1000ML POUR BTL (IV SOLUTION) ×2 IMPLANT
PACK BASIN DAY SURGERY FS (CUSTOM PROCEDURE TRAY) ×2 IMPLANT
PENCIL BUTTON HOLSTER BLD 10FT (ELECTRODE) ×2 IMPLANT
SLEEVE SCD COMPRESS KNEE MED (MISCELLANEOUS) ×1 IMPLANT
SPONGE LAP 4X18 RFD (DISPOSABLE) ×2 IMPLANT
SUT MNCRL AB 4-0 PS2 18 (SUTURE) ×2 IMPLANT
SUT SILK 2 0 SH (SUTURE) ×2 IMPLANT
SUT VIC AB 3-0 SH 27 (SUTURE) ×2
SUT VIC AB 3-0 SH 27X BRD (SUTURE) ×1 IMPLANT
SYR CONTROL 10ML LL (SYRINGE) ×2 IMPLANT
TOWEL GREEN STERILE FF (TOWEL DISPOSABLE) ×2 IMPLANT
TUBE CONNECTING 20X1/4 (TUBING) ×1 IMPLANT
YANKAUER SUCT BULB TIP NO VENT (SUCTIONS) ×1 IMPLANT

## 2019-07-12 NOTE — Anesthesia Postprocedure Evaluation (Signed)
Anesthesia Post Note  Patient: Connie Marshall  Procedure(s) Performed: EXCISION LEFT AXILLARY MASS (Left Axilla)     Patient location during evaluation: PACU Anesthesia Type: General Level of consciousness: sedated and patient cooperative Pain management: pain level controlled Vital Signs Assessment: post-procedure vital signs reviewed and stable Respiratory status: spontaneous breathing Cardiovascular status: stable Anesthetic complications: no    Last Vitals:  Vitals:   07/12/19 1030 07/12/19 1045  BP: 126/70 126/72  Pulse: 67 72  Resp: 10 14  Temp:    SpO2: 100% 97%    Last Pain:  Vitals:   07/12/19 1030  TempSrc:   PainSc: [place]

## 2019-07-12 NOTE — Transfer of Care (Signed)
Immediate Anesthesia Transfer of Care Note  Patient: Connie Marshall  Procedure(s) Performed: EXCISION LEFT AXILLARY MASS (Left Axilla)  Patient Location: PACU  Anesthesia Type:General  Level of Consciousness: sedated  Airway & Oxygen Therapy: Patient Spontanous Breathing and Patient connected to face mask oxygen  Post-op Assessment: Report given to RN and Post -op Vital signs reviewed and stable  Post vital signs: Reviewed and stable  Last Vitals:  Vitals Value Taken Time  BP 141/78 07/12/19 0906  Temp 37 C 07/12/19 0906  Pulse 103 07/12/19 0908  Resp 22 07/12/19 0908  SpO2 100 % 07/12/19 0908  Vitals shown include unvalidated device data.  Last Pain:  Vitals:   07/12/19 0728  TempSrc: Oral  PainSc: 0-No pain         Complications: No apparent anesthesia complications

## 2019-07-12 NOTE — Discharge Instructions (Signed)
Ok to shower starting tomorrow  No vigorous activity for one week  Ice pack, tylenol, ibuprofen also for pain   Post Anesthesia Home Care Instructions  Activity: Get plenty of rest for the remainder of the day. A responsible individual must stay with you for 24 hours following the procedure.  For the next 24 hours, DO NOT: -Drive a car -Paediatric nurse -Drink alcoholic beverages -Take any medication unless instructed by your physician -Make any legal decisions or sign important papers.  Meals: Start with liquid foods such as gelatin or soup. Progress to regular foods as tolerated. Avoid greasy, spicy, heavy foods. If nausea and/or vomiting occur, drink only clear liquids until the nausea and/or vomiting subsides. Call your physician if vomiting continues.  Special Instructions/Symptoms: Your throat may feel dry or sore from the anesthesia or the breathing tube placed in your throat during surgery. If this causes discomfort, gargle with warm salt water. The discomfort should disappear within 24 hours.  If you had a scopolamine patch placed behind your ear for the management of post- operative nausea and/or vomiting:  1. The medication in the patch is effective for 72 hours, after which it should be removed.  Wrap patch in a tissue and discard in the trash. Wash hands thoroughly with soap and water. 2. You may remove the patch earlier than 72 hours if you experience unpleasant side effects which may include dry mouth, dizziness or visual disturbances. 3. Avoid touching the patch. Wash your hands with soap and water after contact with the patch.

## 2019-07-12 NOTE — Op Note (Signed)
EXCISION LEFT AXILLARY MASS  Procedure Note  Connie Marshall 07/12/2019   Pre-op Diagnosis: LEFT AXILLARY MASS     Post-op Diagnosis: same  Procedure(s): EXCISION LEFT AXILLARY MASS ( 5 cm )  Surgeon(s): Coralie Keens, MD   Assist: Carlena Hurl, PA  Anesthesia: General  Staff:  Circulator: Izora Ribas, RN Scrub Person: Lavell Islam, Dawn M, CST  Estimated Blood Loss: Minimal               Specimens: sent to path  Indications: This is a 73 year old female who presents with a mass in her left axilla.  It is been slowly growing and causing some discomfort.  She has no signs or symptoms of lymphoma.  Decision was made proceed to the operating room for excision  Procedure: The patient was brought to the operating room and identifies correct patient.  She was placed upon the operating room table and general anesthesia was induced.  Her left axilla was then prepped and draped in the usual sterile fashion.  I anesthetized the skin over the top of the mass with Marcaine.  I then made incision with a scalpel and dissected through the subcutaneous tissue to the mass.  The mass was quite superficial.  It was not in the deep axillary tissue.  I was able to excise it in its entirety as well as an overlying cuff of skin with electrocautery and a scalpel.  Several small bridging veins were clipped with surgical clips.  The mass again was approximate 5 cm in size.  It was sent to pathology for evaluation.  Hemostasis was achieved in the axilla.  I anesthetized it further with Marcaine.  I could feel no other enlarged masses in the axilla or deep axillary tissue.  The subcutaneous tissue was then closed with interrupted 3-0 Vicryl sutures and the skin was closed with a running 4-0 Monocryl.  Dermabond was then applied.  The patient tolerated the procedure well.  All counts were correct at the end of the procedure.  The patient was then extubated in the operating room and taken in a  stable condition to the recovery room.            Coralie Keens   Date: 07/12/2019  Time: 8:45 AM

## 2019-07-12 NOTE — Interval H&P Note (Signed)
History and Physical Interval Note:no change in H and P  07/12/2019 7:10 AM  Connie Marshall  has presented today for surgery, with the diagnosis of LEFT AXILLARY MASS.  The various methods of treatment have been discussed with the patient and family. After consideration of risks, benefits and other options for treatment, the patient has consented to  Procedure(s): EXCISION LEFT AXILLARY MASS (Left) as a surgical intervention.  The patient's history has been reviewed, patient examined, no change in status, stable for surgery.  I have reviewed the patient's chart and labs.  Questions were answered to the patient's satisfaction.     Coralie Keens

## 2019-07-12 NOTE — Anesthesia Procedure Notes (Signed)
Procedure Name: LMA Insertion Date/Time: 07/12/2019 8:17 AM Performed by: Maryella Shivers, CRNA Pre-anesthesia Checklist: Patient identified, Emergency Drugs available, Suction available and Patient being monitored Patient Re-evaluated:Patient Re-evaluated prior to induction Oxygen Delivery Method: Circle system utilized Preoxygenation: Pre-oxygenation with 100% oxygen Induction Type: IV induction Ventilation: Mask ventilation without difficulty LMA: LMA inserted LMA Size: 4.0 Number of attempts: 1 Airway Equipment and Method: Bite block Placement Confirmation: positive ETCO2 Tube secured with: Tape Dental Injury: Teeth and Oropharynx as per pre-operative assessment

## 2019-07-13 ENCOUNTER — Encounter (HOSPITAL_COMMUNITY): Admission: RE | Payer: Self-pay | Source: Home / Self Care

## 2019-07-13 ENCOUNTER — Ambulatory Visit (HOSPITAL_COMMUNITY): Admission: RE | Admit: 2019-07-13 | Payer: Medicare Other | Source: Home / Self Care | Admitting: Surgery

## 2019-07-13 ENCOUNTER — Other Ambulatory Visit: Payer: Self-pay

## 2019-07-13 ENCOUNTER — Encounter (HOSPITAL_BASED_OUTPATIENT_CLINIC_OR_DEPARTMENT_OTHER): Payer: Self-pay | Admitting: Surgery

## 2019-07-13 SURGERY — REPAIR, HERNIA, INGUINAL, ADULT
Anesthesia: General | Laterality: Right

## 2019-07-20 ENCOUNTER — Other Ambulatory Visit: Payer: Self-pay | Admitting: Surgery

## 2019-07-20 DIAGNOSIS — C50919 Malignant neoplasm of unspecified site of unspecified female breast: Secondary | ICD-10-CM

## 2019-08-02 ENCOUNTER — Other Ambulatory Visit: Payer: Self-pay

## 2019-08-02 ENCOUNTER — Ambulatory Visit
Admission: RE | Admit: 2019-08-02 | Discharge: 2019-08-02 | Disposition: A | Discharge status: Home / Self Care | Payer: Medicare Other | Source: Ambulatory Visit | Attending: Surgery | Admitting: Surgery

## 2019-08-02 DIAGNOSIS — C50919 Malignant neoplasm of unspecified site of unspecified female breast: Secondary | ICD-10-CM

## 2019-08-02 IMAGING — MR MR BREAST BILAT WO/W CM
10 of 15 series · 32 of 48 positions shown · IV contrast (gadavist)
Comparison: Previous exam(s).

CLINICAL DATA: 73-year-old female status post surgical excision of
a left axillary lymph node demonstrating malignancy of breast
origin.

LABS:  None performed today and site.
EXAM:
BILATERAL BREAST MRI WITH AND WITHOUT CONTRAST
TECHNIQUE: Multiplanar, multisequence MR images of both breasts were obtained
prior to and following the intravenous administration of 10 ml of
Gadavist.

[Series 2: t2_tirm_tra ipat (a-p) · axial · 3.0mm · 0.74mm/px · 1 of 60 slices shown]
[im 1/60]
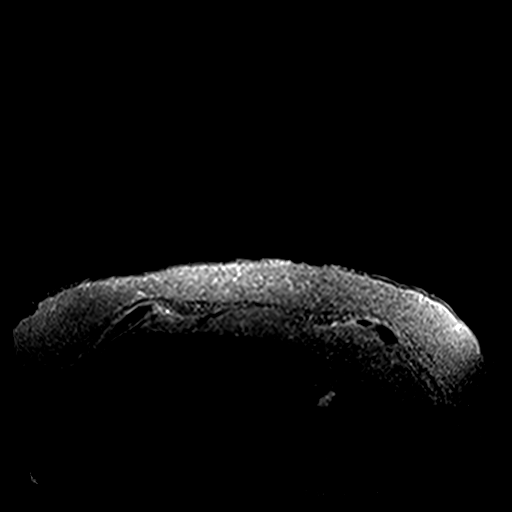

[Series 4: fl3d pre-cm · axial · non-contrast · 1.2mm · 1.04mm/px · z∈[-113,+78]mm · 4 of 160 slices shown]
[im 1/160]
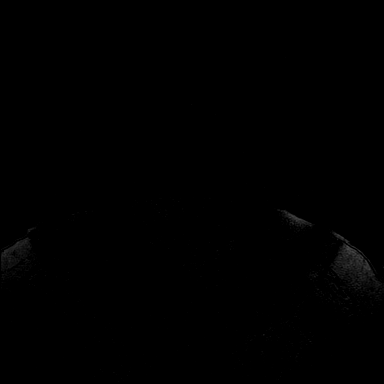
[im 54/160]
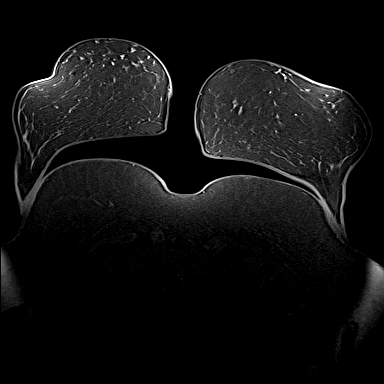
[im 107/160]
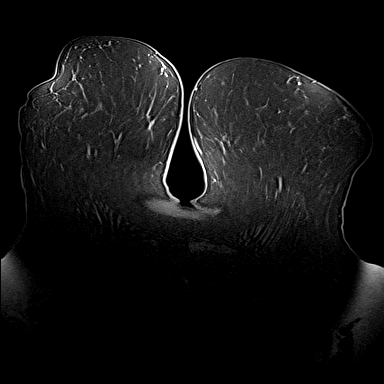
[im 160/160]
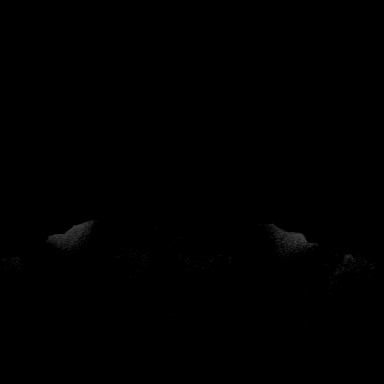

[Series 5: fl3d pre-cm no · axial · non-contrast · 1.2mm · 1.04mm/px · z∈[-113,+78]mm · 4 of 150 slices shown]
[im 1/150]
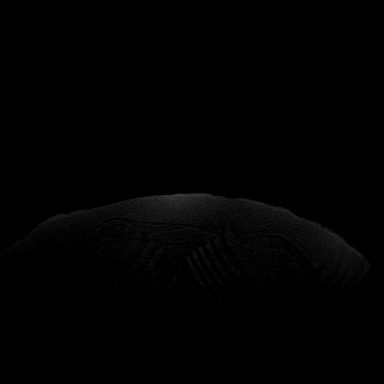
[im 50/150]
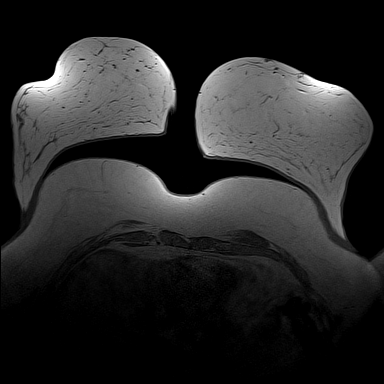
[im 100/150]
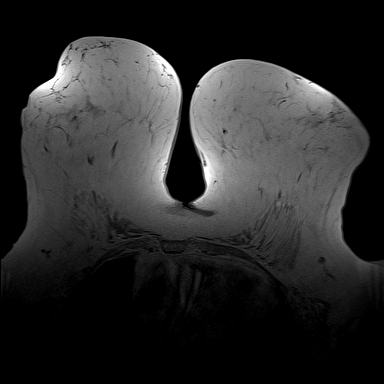
[im 150/150]
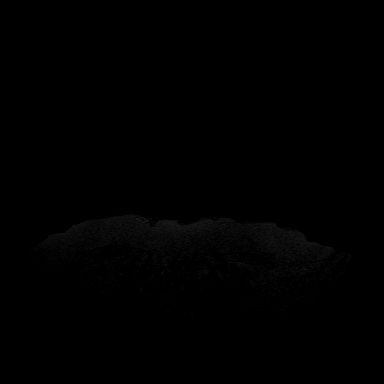

[Series 6: fl3d post-cm 20 · axial · 1.2mm · 1.04mm/px · z∈[-113,+78]mm · 4 of 160 slices shown (1 of 3)]
[im 1/160]
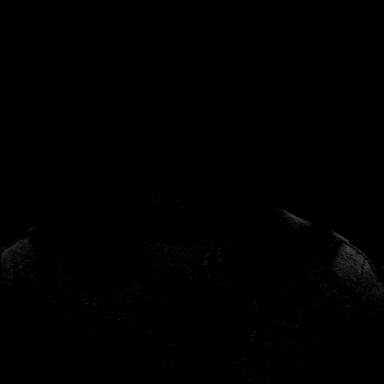
[im 54/160]
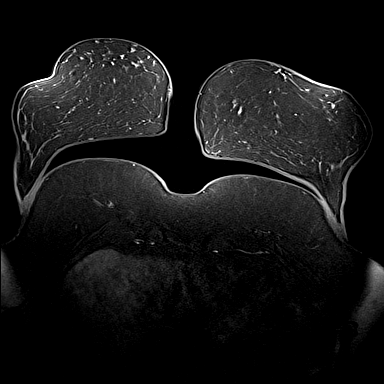
[im 107/160]
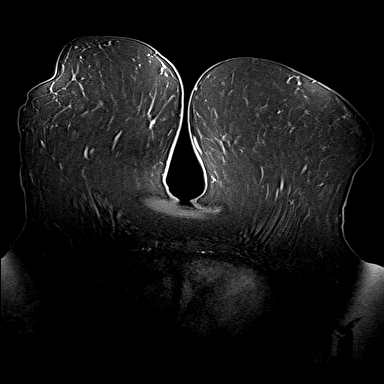
[im 160/160]
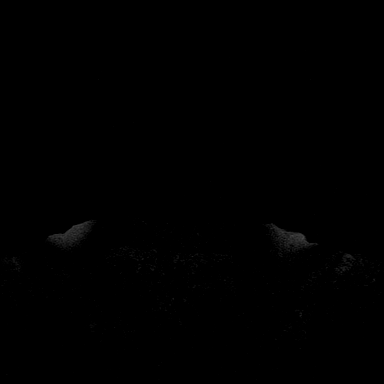

[Series 7: fl3d post-cm 20 · axial · 1.2mm · 1.04mm/px · z∈[-113,+78]mm · 5 of 159 slices shown (2 of 3)]
[im 1/159]
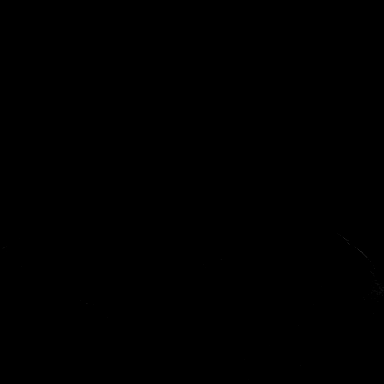
[im 40/159]
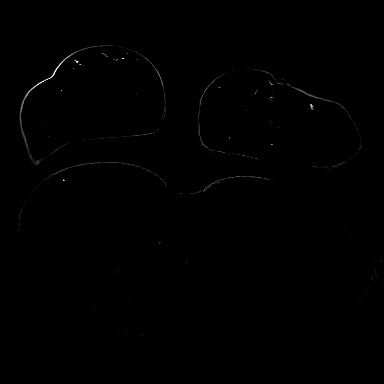
[im 80/159]
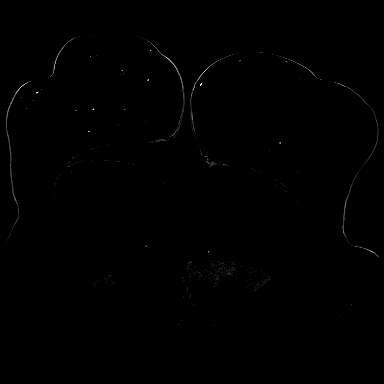
[im 119/159]
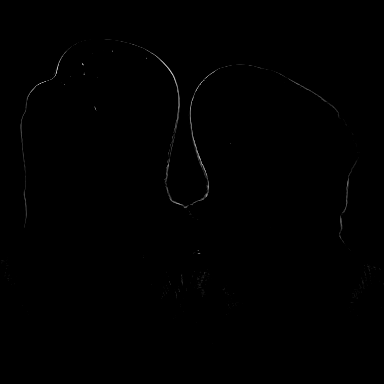
[im 159/159]
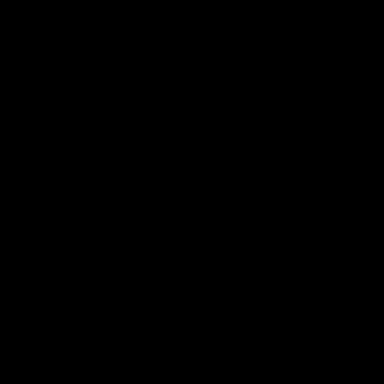

[Series 8: fl3d post-cm 20 · axial · 192.0mm · 1.04mm/px · 1 of 1 slices shown (3 of 3)]
[im 1/1]
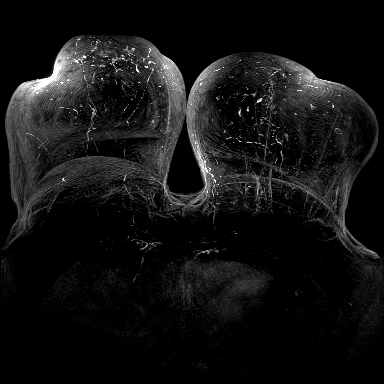

[Series 9: fl3d post-cm 3min · axial · 1.2mm · 1.04mm/px · z∈[-113,+78]mm · 5 of 160 slices shown]
[im 1/160]
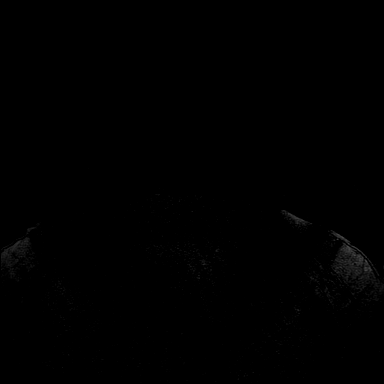
[im 40/160]
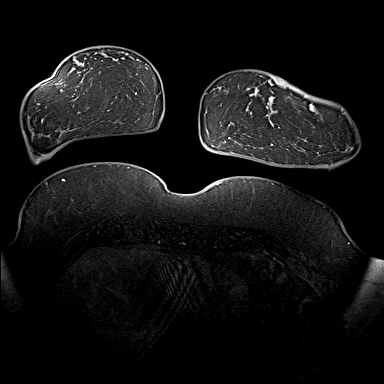
[im 80/160]
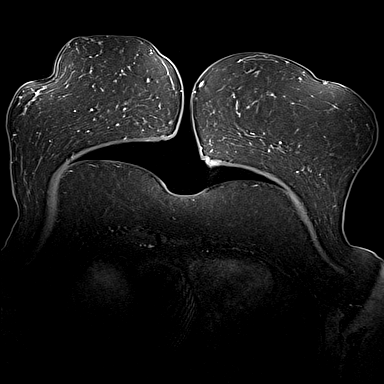
[im 120/160]
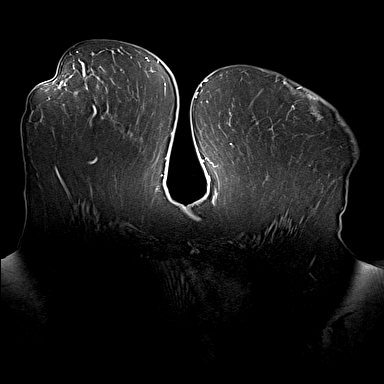
[im 160/160]
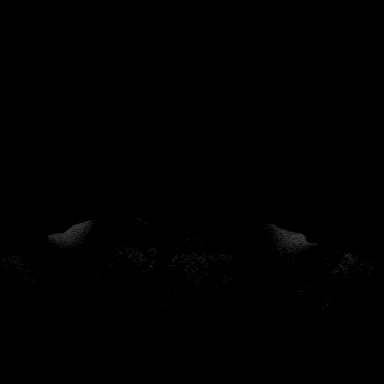

[Series 10: fl3d post-cm 3min_sub · axial · 1.2mm · 1.04mm/px · z∈[-113,+78]mm · 5 of 147 slices shown]
[im 1/147]
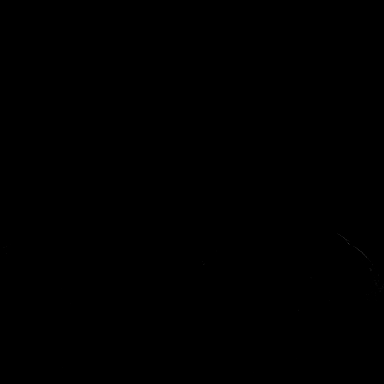
[im 37/147]
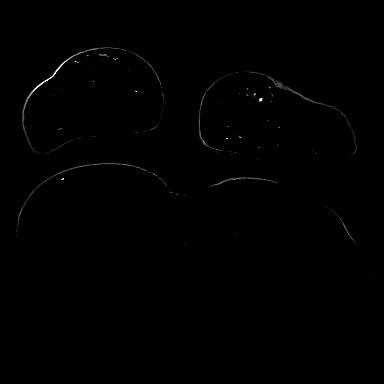
[im 74/147]
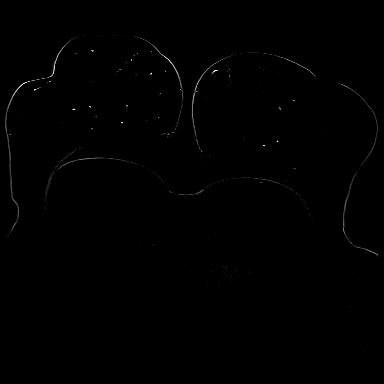
[im 110/147]
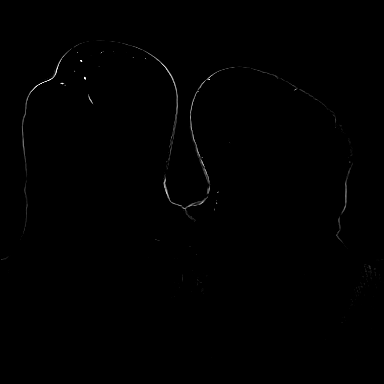
[im 147/147]
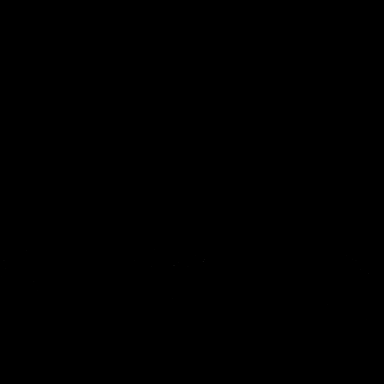

[Series 11: fl3d post-cm 3min_sub_mip_tra · axial · 192.0mm · 1.04mm/px · 1 of 1 slices shown]
[im 1/1]
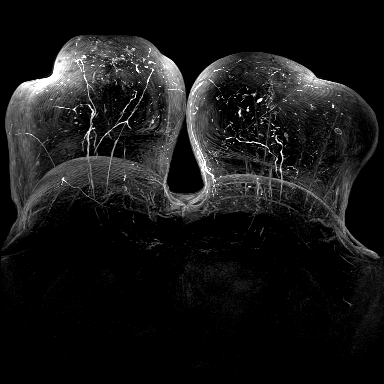

[Series 12: fl3d post-cm 5min · axial · 1.2mm · 1.04mm/px · z∈[-113,-66]mm · 2 of 160 slices shown]
[im 1/160]
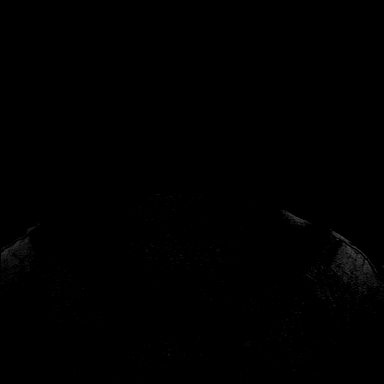
[im 40/160]
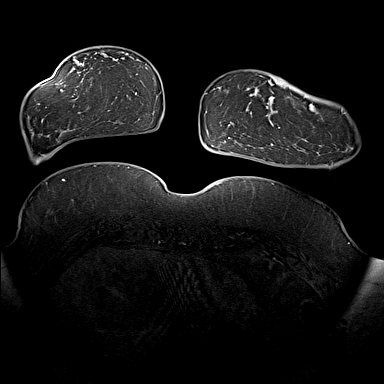

[32 of 48 positions shown; findings below may reference images not displayed]

Three-dimensional MR images were rendered by post-processing of the
original MR data on an independent workstation. The
three-dimensional MR images were interpreted, and findings are
reported in the following complete MRI report for this study. Three
dimensional images were evaluated at the independent [employer]
workstation
FINDINGS: Breast composition: a. Almost entirely fat.

Background parenchymal enhancement: Mild-to-moderate.

Right breast: There is an irregular, enhancing linear mass in the
lower inner quadrant of the right breast at anterior depth (series
7, images 98 to 106 of 160). It measures approximately 6 x 6 x 13
mm. Otherwise, no mass or abnormal enhancement.

Left breast: There is an enhancing, irregular mass in the lower
inner quadrant at middle depth (series 7, image 106 of 160). It
measures approximately 5 x 15 x 5 mm. Otherwise, no mass or abnormal
enhancement.

Lymph nodes: No abnormal appearing lymph nodes.

Ancillary findings:  None.
IMPRESSION: 1. Indeterminate, irregular enhancing mass in the lower inner
quadrant of the left breast. Recommendation is for MRI guided
biopsy.
2. Indeterminate, enhancing mass in the lower inner quadrant of the
right breast. Recommendation is for MRI guided biopsy.

RECOMMENDATION:
MRI guided biopsies of the bilateral breasts. Given the patient's
body habitus and location of the lesions, this will likely need to
be performed on two separate days with a medial approach for each.

BI-RADS CATEGORY  4: Suspicious.

## 2019-08-02 MED ORDER — GADOBUTROL 1 MMOL/ML IV SOLN
10.0000 mL | Freq: Once | INTRAVENOUS | Status: AC | PRN
  Administered 2019-08-02: 10 mL via INTRAVENOUS

## 2019-08-05 ENCOUNTER — Other Ambulatory Visit: Payer: Self-pay | Admitting: Internal Medicine

## 2019-08-05 ENCOUNTER — Other Ambulatory Visit (INDEPENDENT_AMBULATORY_CARE_PROVIDER_SITE_OTHER): Payer: Medicare Other

## 2019-08-05 DIAGNOSIS — Z0000 Encounter for general adult medical examination without abnormal findings: Secondary | ICD-10-CM

## 2019-08-05 DIAGNOSIS — E119 Type 2 diabetes mellitus without complications: Secondary | ICD-10-CM | POA: Diagnosis not present

## 2019-08-05 DIAGNOSIS — E559 Vitamin D deficiency, unspecified: Secondary | ICD-10-CM

## 2019-08-05 DIAGNOSIS — E538 Deficiency of other specified B group vitamins: Secondary | ICD-10-CM

## 2019-08-05 LAB — VITAMIN D 25 HYDROXY (VIT D DEFICIENCY, FRACTURES): VITD: 21.57 ng/mL — ABNORMAL LOW (ref 30.00–100.00)

## 2019-08-05 LAB — URINALYSIS, ROUTINE W REFLEX MICROSCOPIC
Bilirubin Urine: NEGATIVE
Hgb urine dipstick: NEGATIVE
Ketones, ur: NEGATIVE
Leukocytes,Ua: NEGATIVE
Nitrite: NEGATIVE
Specific Gravity, Urine: 1.025 (ref 1.000–1.030)
Total Protein, Urine: NEGATIVE
Urine Glucose: NEGATIVE
Urobilinogen, UA: 0.2 (ref 0.0–1.0)
pH: 5.5 (ref 5.0–8.0)

## 2019-08-05 LAB — CBC WITH DIFFERENTIAL/PLATELET
Basophils Absolute: 0 10*3/uL (ref 0.0–0.1)
Basophils Relative: 0.6 % (ref 0.0–3.0)
Eosinophils Absolute: 0.2 10*3/uL (ref 0.0–0.7)
Eosinophils Relative: 4.1 % (ref 0.0–5.0)
HCT: 42.1 % (ref 36.0–46.0)
Hemoglobin: 14.2 g/dL (ref 12.0–15.0)
Lymphocytes Relative: 43.8 % (ref 12.0–46.0)
Lymphs Abs: 1.6 10*3/uL (ref 0.7–4.0)
MCHC: 33.7 g/dL (ref 30.0–36.0)
MCV: 88.4 fl (ref 78.0–100.0)
Monocytes Absolute: 0.4 10*3/uL (ref 0.1–1.0)
Monocytes Relative: 9.4 % (ref 3.0–12.0)
Neutro Abs: 1.6 10*3/uL (ref 1.4–7.7)
Neutrophils Relative %: 42.1 % — ABNORMAL LOW (ref 43.0–77.0)
Platelets: 240 10*3/uL (ref 150.0–400.0)
RBC: 4.77 Mil/uL (ref 3.87–5.11)
RDW: 13 % (ref 11.5–15.5)
WBC: 3.8 10*3/uL — ABNORMAL LOW (ref 4.0–10.5)

## 2019-08-05 LAB — MICROALBUMIN / CREATININE URINE RATIO
Creatinine,U: 185.6 mg/dL
Microalb Creat Ratio: 0.7 mg/g (ref 0.0–30.0)
Microalb, Ur: 1.3 mg/dL (ref 0.0–1.9)

## 2019-08-05 LAB — LIPID PANEL
Cholesterol: 129 mg/dL (ref 0–200)
HDL: 37.1 mg/dL — ABNORMAL LOW (ref >39.00)
LDL Cholesterol: 65 mg/dL (ref 0–99)
NonHDL: 91.77
Total CHOL/HDL Ratio: 3
Triglycerides: 133 mg/dL (ref 0.0–149.0)
VLDL: 26.6 mg/dL (ref 0.0–40.0)

## 2019-08-05 LAB — HEPATIC FUNCTION PANEL
ALT: 9 U/L (ref 0–35)
AST: 13 U/L (ref 0–37)
Albumin: 4.4 g/dL (ref 3.5–5.2)
Alkaline Phosphatase: 62 U/L (ref 39–117)
Bilirubin, Direct: 0.1 mg/dL (ref 0.0–0.3)
Total Bilirubin: 0.5 mg/dL (ref 0.2–1.2)
Total Protein: 7.3 g/dL (ref 6.0–8.3)

## 2019-08-05 LAB — VITAMIN B12: Vitamin B-12: 444 pg/mL (ref 211–911)

## 2019-08-05 LAB — BASIC METABOLIC PANEL
BUN: 10 mg/dL (ref 6–23)
CO2: 25 mEq/L (ref 19–32)
Calcium: 10.2 mg/dL (ref 8.4–10.5)
Chloride: 104 mEq/L (ref 96–112)
Creatinine, Ser: 0.8 mg/dL (ref 0.40–1.20)
GFR: 84.96 mL/min (ref >60.00)
Glucose, Bld: 149 mg/dL — ABNORMAL HIGH (ref 70–99)
Potassium: 4.1 mEq/L (ref 3.5–5.1)
Sodium: 139 mEq/L (ref 135–145)

## 2019-08-05 LAB — TSH: TSH: 2.23 u[IU]/mL (ref 0.35–4.50)

## 2019-08-05 LAB — HEMOGLOBIN A1C: Hgb A1c MFr Bld: 6.5 % (ref 4.6–6.5)

## 2019-08-05 MED ORDER — VITAMIN D (ERGOCALCIFEROL) 1.25 MG (50000 UNIT) PO CAPS: 50000.0000 [IU] | ORAL_CAPSULE | ORAL | 0 refills | Status: DC

## 2019-08-06 ENCOUNTER — Other Ambulatory Visit: Payer: Self-pay | Admitting: Surgery

## 2019-08-06 DIAGNOSIS — R9389 Abnormal findings on diagnostic imaging of other specified body structures: Secondary | ICD-10-CM

## 2019-08-09 ENCOUNTER — Ambulatory Visit (INDEPENDENT_AMBULATORY_CARE_PROVIDER_SITE_OTHER): Payer: Medicare Other

## 2019-08-09 ENCOUNTER — Other Ambulatory Visit: Payer: Self-pay

## 2019-08-09 DIAGNOSIS — E538 Deficiency of other specified B group vitamins: Secondary | ICD-10-CM | POA: Diagnosis not present

## 2019-08-09 MED ORDER — CYANOCOBALAMIN 1000 MCG/ML IJ SOLN
1000.0000 ug | Freq: Once | INTRAMUSCULAR | Status: AC
  Administered 2019-08-09: 1000 ug via INTRAMUSCULAR

## 2019-08-09 NOTE — Progress Notes (Signed)
Medical screening examination/treatment/procedure(s) were performed by non-physician practitioner and as supervising physician I was immediately available for consultation/collaboration. I agree with above. Eliora Nienhuis, MD   

## 2019-08-13 ENCOUNTER — Telehealth: Payer: Self-pay | Admitting: Hematology and Oncology

## 2019-08-13 NOTE — Telephone Encounter (Signed)
Confirmed with patient 11/3 new patient appointment with Dr. Lindi Adie.

## 2019-08-16 ENCOUNTER — Telehealth: Payer: Self-pay | Admitting: *Deleted

## 2019-08-16 NOTE — Telephone Encounter (Signed)
Please clarify with complete dates as I cannot tell if the numbers are month, day or years

## 2019-08-16 NOTE — Telephone Encounter (Signed)
Copied from [place] [phone]. Topic: General - Other >> Aug 16, 2019  9:31 AM Keene Breath wrote: Reason for CRM: Called to ask the doctor to call regarding patient's policy.  Needs to know if the patient had a cancer diagnosis between 4/17 - 5/20.  Please call to discuss at [phone], Ref# [id number]

## 2019-08-17 ENCOUNTER — Inpatient Hospital Stay: Payer: Medicare Other | Admitting: Hematology and Oncology

## 2019-08-17 DIAGNOSIS — C50911 Malignant neoplasm of unspecified site of right female breast: Secondary | ICD-10-CM | POA: Insufficient documentation

## 2019-08-17 DIAGNOSIS — C50912 Malignant neoplasm of unspecified site of left female breast: Secondary | ICD-10-CM | POA: Insufficient documentation

## 2019-08-17 NOTE — Telephone Encounter (Signed)
I see no evidence on the chart of a formal diagnosis of breast (or any other) cancer during that time period

## 2019-08-17 NOTE — Telephone Encounter (Signed)
Called above # and waited too long. Requested call back via automated recording.

## 2019-08-17 NOTE — Telephone Encounter (Signed)
They need to know if the patient was seen for prior cancer during April 2017 through May 2020.

## 2019-08-17 NOTE — Telephone Encounter (Signed)
Representative at [facility] informed of below.

## 2019-08-18 NOTE — Progress Notes (Signed)
[place] NOTE  Patient Care Team: [name], MD as PCP - General (Internal Medicine) Kathie Rhodes, MD (Urology) Dyke Maes, [place] (Optometry) Lafayette Dragon, MD (Inactive) (Gastroenterology) Thornell Sartorius, MD (Otolaryngology)  CHIEF COMPLAINTS/PURPOSE OF CONSULTATION:  Newly diagnosed breast cancer  HISTORY OF PRESENTING ILLNESS:  Connie Marshall 73 y.o. female is here because of recent diagnosis of invasive ductal carcinoma. She underwent an excision of a left axillary mass on 19/28/20 with Dr. Ninfa Linden for which pathology showed invasive ductal carcinoma of the breast. Breast MRI on 08/02/19 showed an indeterminate left breast mass, 1.5cm, and a right breast mass, 1.3cm, with no further axillary adenopathy. She presents to the clinic today for initial evaluation and discussion of treatment options.   I reviewed her records extensively and collaborated the history with the patient.  SUMMARY OF ONCOLOGIC HISTORY: Oncology History  Bilateral breast cancer ([place])  07/12/2019 Initial Diagnosis   Left axillary soft tissue excision: Invasive ductal carcinoma lymphoid tissue not present   08/02/2019 Breast MRI   Right breast enhancing linear mass 6 x 6 x 13 mm, left breast enhancing irregular mass 5 x 15 x 5 mm, no abnormal appearing lymph nodes   08/19/2019 Cancer Staging   Staging form: Breast, AJCC 8th Edition - Clinical stage from 08/19/2019: Stage IIA (cT3, cN0, cM0, G2, ER+, PR+, HER2-) - Signed by Nicholas Lose, MD on 08/19/2019     MEDICAL HISTORY:  Past Medical History:  Diagnosis Date  . Axillary mass, left   . Complication of anesthesia   . Diverticulosis   . Hemorrhoids   . Hyperlipidemia   . Hypertension   . IBS (irritable bowel syndrome)   . Obesity   . PONV (postoperative nausea and vomiting)   . Pre-diabetes     SURGICAL HISTORY: Past Surgical History:  Procedure Laterality Date  . CESAREAN SECTION     2 times  .  CHOLECYSTECTOMY     laproscopic  . CHOLECYSTECTOMY, LAPAROSCOPIC    . kidney stone removed  2011  . MASS EXCISION Left 07/12/2019   Procedure: EXCISION LEFT AXILLARY MASS;  Surgeon: Coralie Keens, MD;  Location: [facility];  Service: General;  Laterality: Left;  . THYROGLOSSAL DUCT CYST  2000  . TONSILLECTOMY  4315  . umbilical herniorrhapy    . VAGINAL HYSTERECTOMY  2003    SOCIAL HISTORY: Social History   Socioeconomic History  . Marital status: Widowed    Spouse name: Not on file  . Number of children: Not on file  . Years of education: Not on file  . Highest education level: Not on file  Occupational History  . Not on file  Social Needs  . Financial resource strain: Not on file  . Food insecurity    Worry: Not on file    Inability: Not on file  . Transportation needs    Medical: Not on file    Non-medical: Not on file  Tobacco Use  . Smoking status: Never Smoker  . Smokeless tobacco: Never Used  Substance and Sexual Activity  . Alcohol use: No  . Drug use: No  . Sexual activity: Not Currently  Lifestyle  . Physical activity    Days per week: Not on file    Minutes per session: Not on file  . Stress: Not on file  Relationships  . Social Herbalist on phone: Not on file    Gets together: Not on file  Attends religious service: Not on file    Active member of club or organization: Not on file    Attends meetings of clubs or organizations: Not on file    Relationship status: Not on file  . Intimate partner violence    Fear of current or ex partner: Not on file    Emotionally abused: Not on file    Physically abused: Not on file    Forced sexual activity: Not on file  Other Topics Concern  . Not on file  Social History Narrative   HSG, [employer] for 2 years. Married, '59-2 years- widowed; '76- 17 years/ widowed '93. 1 son- '77, 1 daughter- '78, 4 grandchildren. 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 Lives  alone - I- ADLS.    FAMILY HISTORY: Family History  Problem Relation Age of Onset  . Hypertension Mother   . Diabetes Father   . Hypertension Father   . Parkinson's disease Sister   . Prostate cancer Brother     ALLERGIES:  is allergic to tape.  MEDICATIONS:  Current Outpatient Medications  Medication Sig Dispense Refill  . clotrimazole-betamethasone (LOTRISONE) cream Use as directed 15 g 1  . flurandrenolide (CORDRAN) 0.05 % lotion     . fosinopril (MONOPRIL) 20 MG tablet Take 1 tablet (20 mg total) by mouth daily. 90 tablet 3  . furosemide (LASIX) 40 MG tablet 1 tab by mouth in the AM, and 1 in the PM as needed for leg swelling 180 tablet 3  . halobetasol (ULTRAVATE) 0.05 % ointment     . 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 simvastatin (ZOCOR) 20 MG tablet TAKE 1 TABLET BY MOUTH EVERY EVENING 30 tablet 11  . traMADol (ULTRAM) 50 MG tablet Take 1 tablet (50 mg total) by mouth every 6 (six) hours as needed for moderate pain or severe pain. 20 tablet 0  . triamcinolone (KENALOG) 0.025 % ointment Apply 1 application topically 2 (two) times daily.    Marland Kitchen VIT B12-METHIONINE-INOS-CHOL IM Inject into the muscle every 30 (thirty) days.    . Vitamin D, Cholecalciferol, 25 MCG (1000 UT) TABS Take by mouth.    . Vitamin D, Ergocalciferol, (DRISDOL) 1.25 MG (50000 UT) CAPS capsule Take 1 capsule (50,000 Units total) by mouth every 7 (seven) days. 12 capsule 0   Current Facility-Administered Medications  Medication Dose Route Frequency Provider Last Rate Last Dose  . denosumab (PROLIA) injection 60 mg  60 mg Subcutaneous Once [name], MD        REVIEW OF SYSTEMS:   Constitutional: Denies fevers, chills or abnormal night sweats Eyes: Denies blurriness of vision, double vision or watery eyes Ears, nose, mouth, throat, and face: Denies mucositis or sore throat Respiratory: Denies cough, dyspnea or wheezes Cardiovascular: Denies palpitation, chest discomfort or  lower extremity swelling Gastrointestinal:  Denies nausea, heartburn or change in bowel habits Skin: Denies abnormal skin rashes Lymphatics: Denies new lymphadenopathy or easy bruising Neurological:Denies numbness, tingling or new weaknesses Behavioral/Psych: Mood is stable, no new changes  Breast: Denies any palpable lumps or discharge All other systems were reviewed with the patient and are negative.  PHYSICAL EXAMINATION: ECOG PERFORMANCE STATUS: 1 - Symptomatic but completely ambulatory  Vitals:   08/19/19 1346  BP: (!) 141/67  Pulse: 86  Resp: 19  Temp: 98.2 F (36.8 C)  SpO2: 99%   Filed Weights   08/19/19 1346  Weight: 244 lb 8 oz (110.9 kg)  GENERAL:alert, no distress and comfortable SKIN: skin color, texture, turgor are normal, no rashes or significant lesions EYES: normal, conjunctiva are pink and non-injected, sclera clear OROPHARYNX:no exudate, no erythema and lips, buccal mucosa, and tongue normal  NECK: supple, thyroid normal size, non-tender, without nodularity LYMPH:  no palpable lymphadenopathy in the cervical, axillary or inguinal LUNGS: clear to auscultation and percussion with normal breathing effort HEART: regular rate & rhythm and no murmurs and no lower extremity edema ABDOMEN:abdomen soft, non-tender and normal bowel sounds Musculoskeletal:no cyanosis of digits and no clubbing  PSYCH: alert & oriented x 3 with fluent speech NEURO: no focal motor/sensory deficits BREAST: No palpable nodules in breast. No palpable axillary or supraclavicular lymphadenopathy (exam performed in the presence of a chaperone)   LABORATORY DATA:  I have reviewed the data as listed Lab Results  Component Value Date   WBC 3.8 (L) 08/05/2019   HGB 14.2 08/05/2019   HCT 42.1 08/05/2019   MCV 88.4 08/05/2019   PLT 240.0 08/05/2019   Lab Results  Component Value Date   NA 139 08/05/2019   K 4.1 08/05/2019   CL 104 08/05/2019   CO2 25 08/05/2019    RADIOGRAPHIC  STUDIES: I have personally reviewed the radiological reports and agreed with the findings in the report.  ASSESSMENT AND PLAN:  Bilateral breast cancer ([place]) 08/02/2019:Right breast enhancing linear mass 6 x 6 x 13 mm, left breast enhancing irregular mass 5 x 15 x 5 mm, no abnormal appearing lymph nodes 07/12/2019:Left axillary soft tissue excision: Invasive ductal carcinoma lymphoid tissue not present ER/PR positive HER-2 equivocal by IHC, FISH pending Because the primary size is at least more than 5 cm, T3N0 stage IIa would be the stage  Treatment plan: 1.  MRI biopsies of bilateral breasts 2.  Additional surgery depending upon the MRI biopsy results 3.  Oncotype DX to determine if she would benefit from chemotherapy 4.  Adjuvant radiation therapy 5.  Follow-up adjuvant antiestrogen therapy  I discussed with her that the axillary soft tissue mass may be a primary breast cancer and not a lymph node metastases but since there was no lymphatic tissue in the mass. Return to clinic after surgery to discuss the final pathology report.  All questions were answered. The patient knows to call the clinic with any problems, questions or concerns.   Rulon Eisenmenger, MD, MPH 08/19/2019    I, Molly Dorshimer, am acting as scribe for Nicholas Lose, MD.  I have reviewed the above documentation for accuracy and completeness, and I agree with the above.

## 2019-08-19 ENCOUNTER — Ambulatory Visit
Admission: RE | Admit: 2019-08-19 | Discharge: 2019-08-19 | Disposition: A | Discharge status: Home / Self Care | Payer: Medicare Other | Source: Ambulatory Visit | Attending: Surgery | Admitting: Surgery

## 2019-08-19 ENCOUNTER — Inpatient Hospital Stay: Payer: Medicare Other | Attending: Hematology and Oncology | Admitting: Hematology and Oncology

## 2019-08-19 ENCOUNTER — Other Ambulatory Visit: Payer: Self-pay | Admitting: Diagnostic Radiology

## 2019-08-19 ENCOUNTER — Other Ambulatory Visit: Payer: Self-pay

## 2019-08-19 DIAGNOSIS — R229 Localized swelling, mass and lump, unspecified: Secondary | ICD-10-CM

## 2019-08-19 DIAGNOSIS — C50912 Malignant neoplasm of unspecified site of left female breast: Secondary | ICD-10-CM

## 2019-08-19 DIAGNOSIS — C50911 Malignant neoplasm of unspecified site of right female breast: Secondary | ICD-10-CM

## 2019-08-19 DIAGNOSIS — R9389 Abnormal findings on diagnostic imaging of other specified body structures: Secondary | ICD-10-CM

## 2019-08-19 DIAGNOSIS — Z8042 Family history of malignant neoplasm of prostate: Secondary | ICD-10-CM

## 2019-08-19 IMAGING — MG MM BREAST LOCALIZATION CLIP
4 series · 4 of 12 positions shown · non-contrast
Comparison: Previous exam(s).

CLINICAL DATA: Status post MRI guided biopsy attempt of an
irregular enhancing mass in the LEFT breast.

EXAM:
DIAGNOSTIC LEFT MAMMOGRAM POST MRI BIOPSY

[L ML synth-2D]
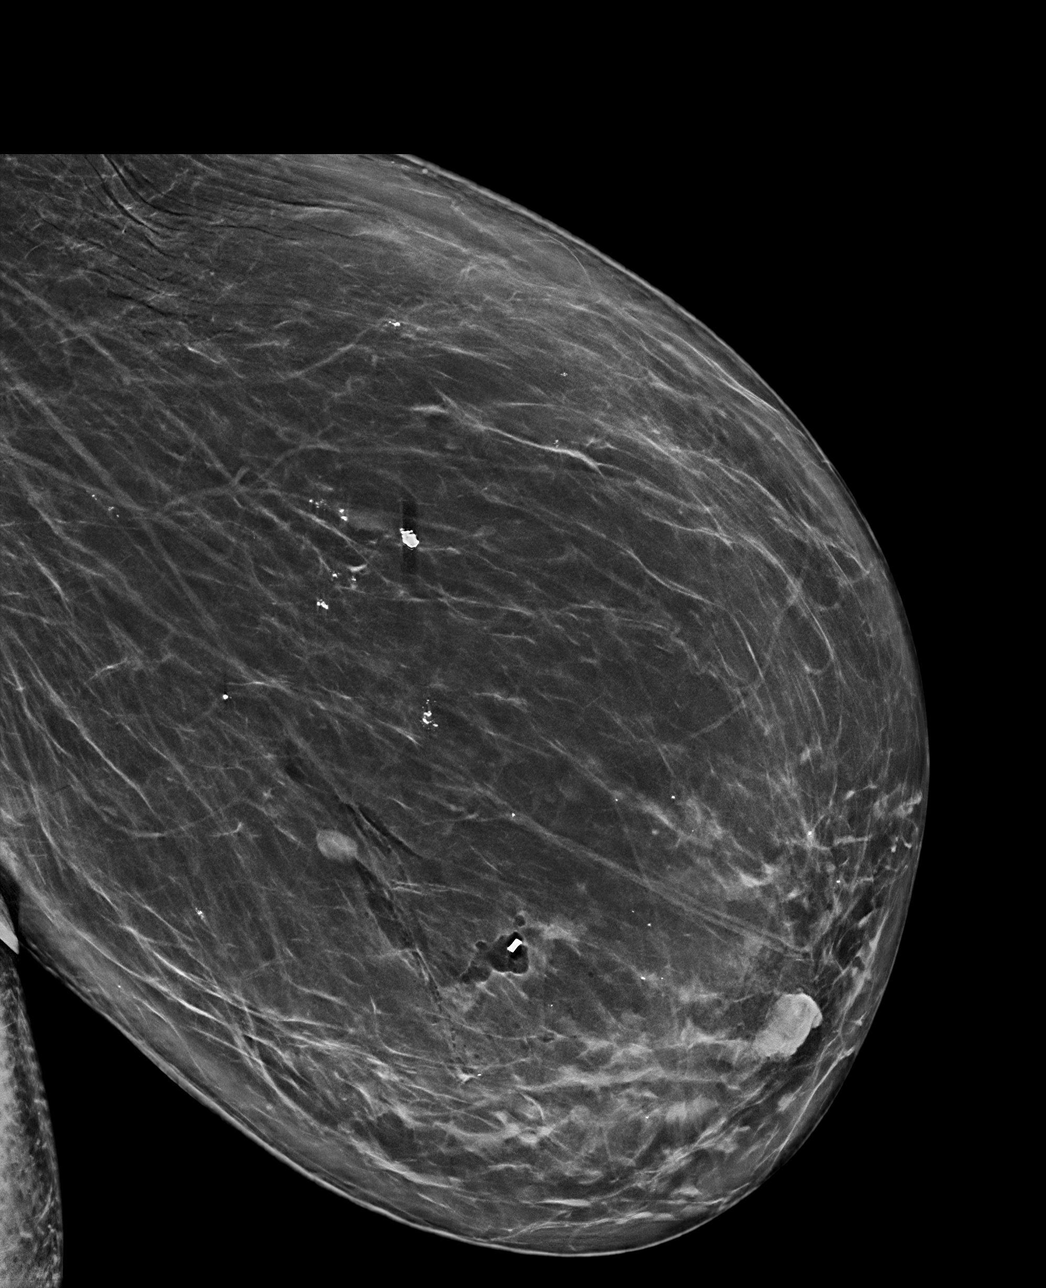

[L CC synth-2D]
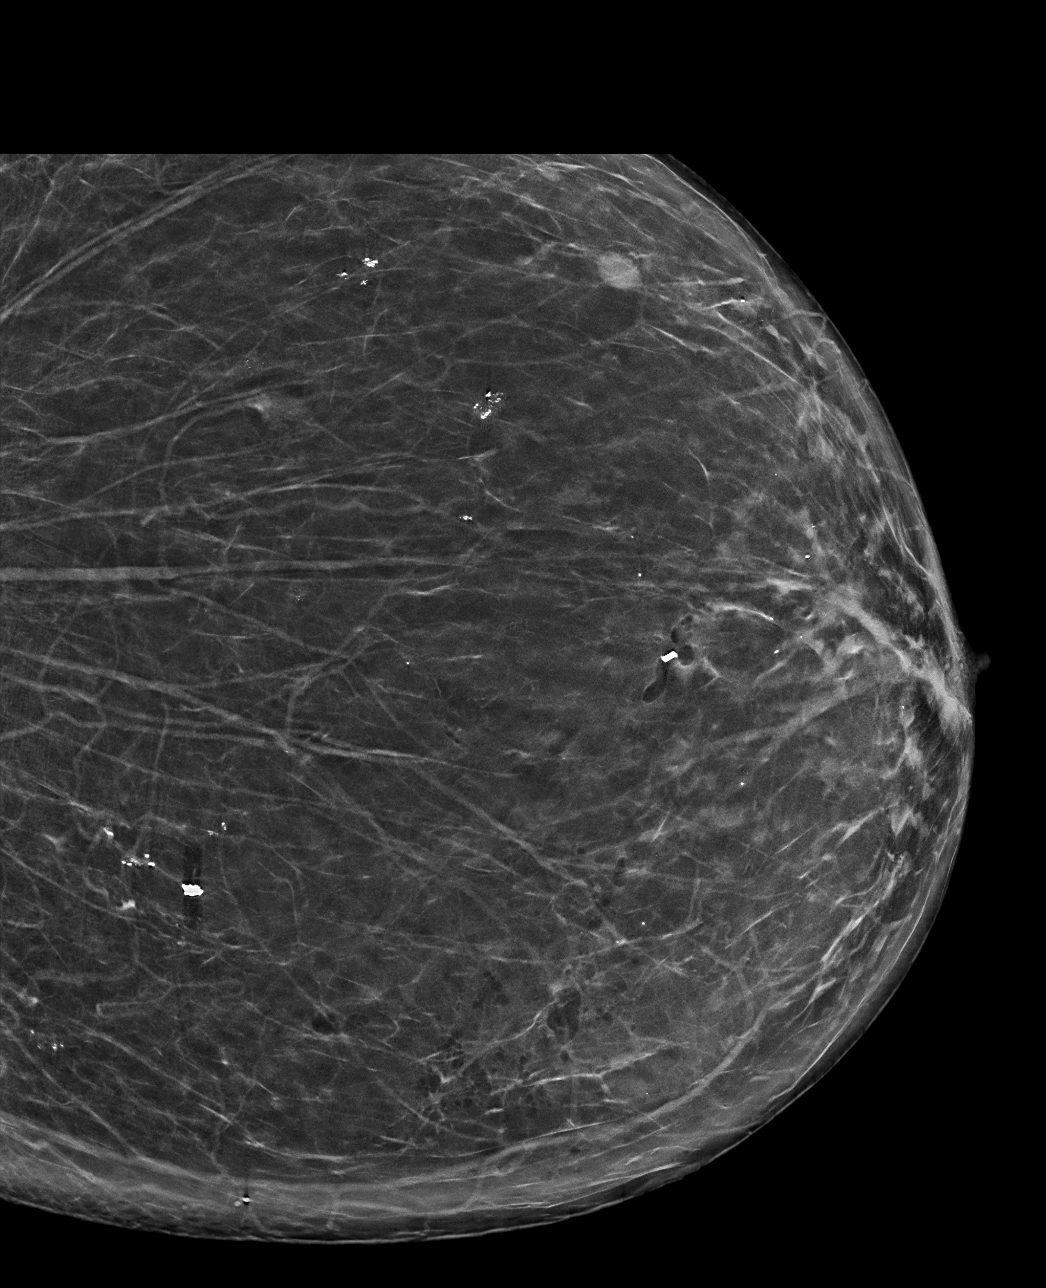

[L ML tomo · tomo slice 47/92.0]
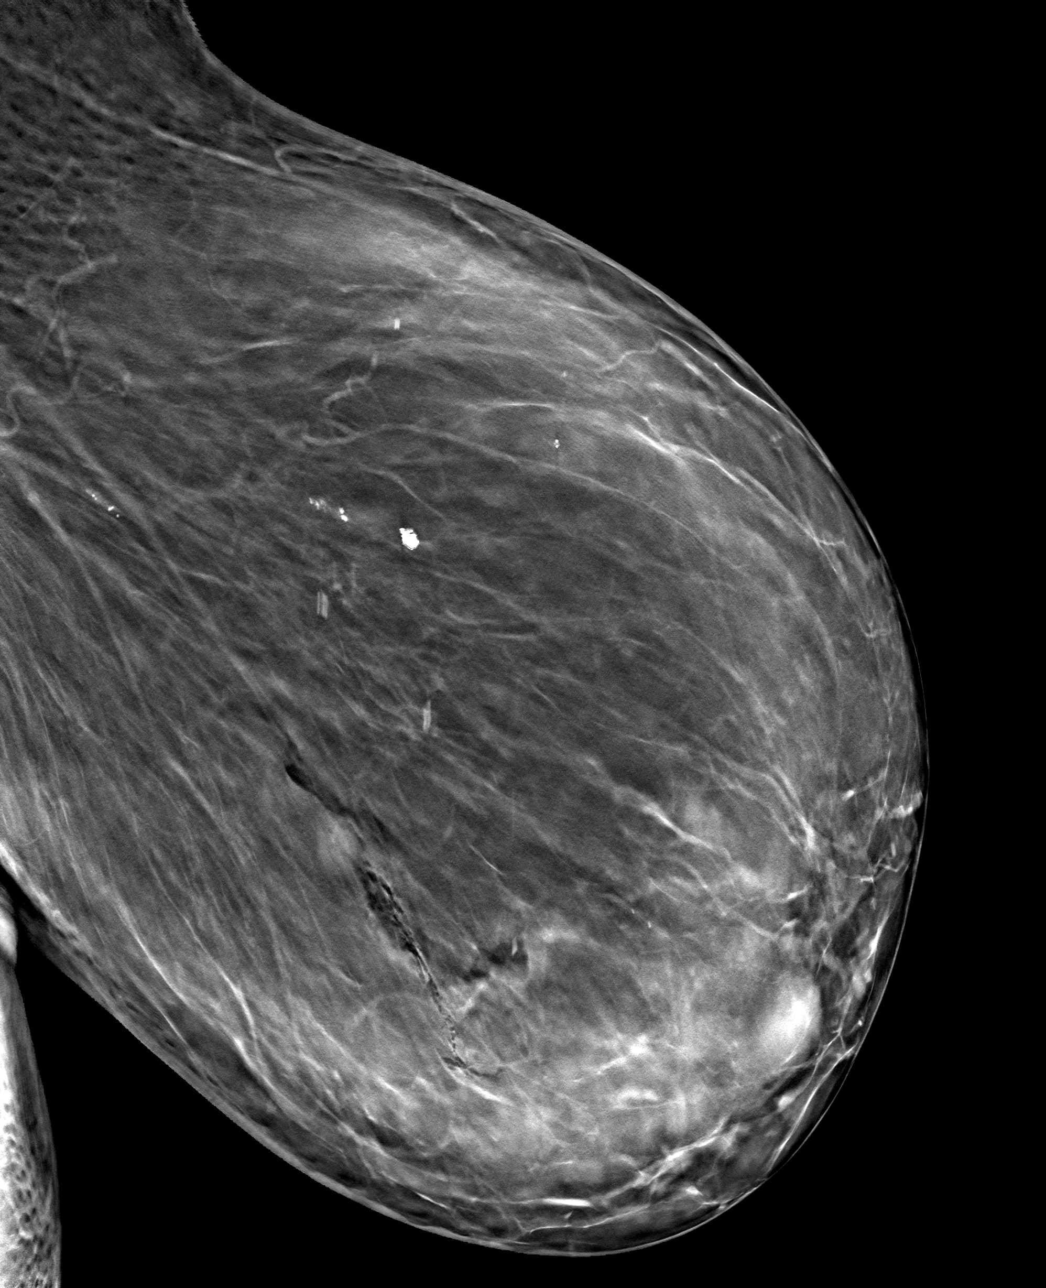

[L CC tomo · tomo slice 41/80.0]
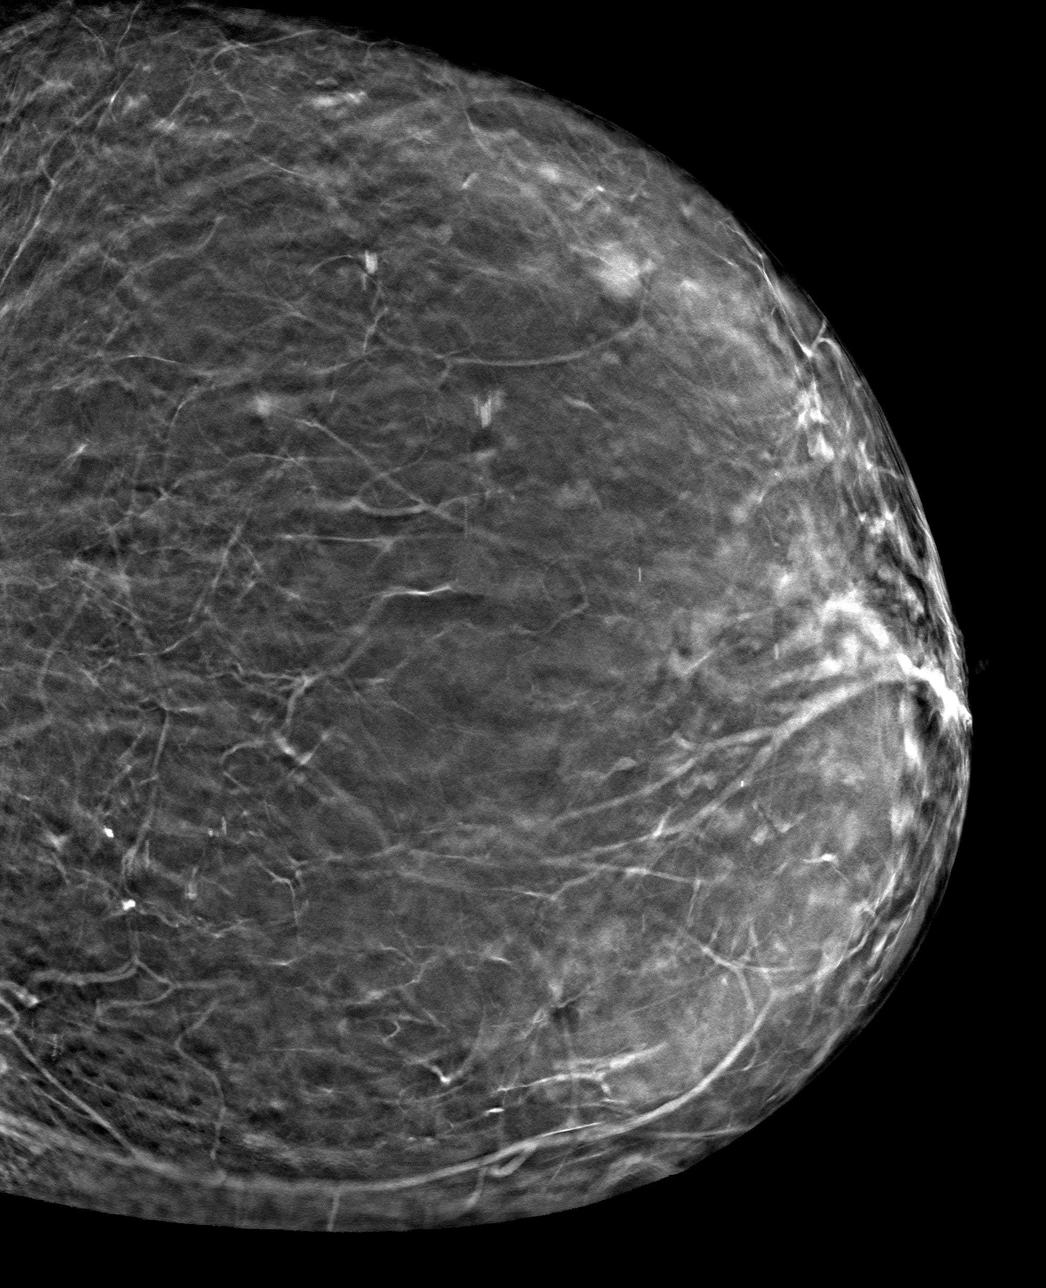

[4 of 12 positions shown; findings below may reference images not displayed]

FINDINGS: Mammographic images were obtained following MRI guided biopsy
attempt of the enhancing mass within the LEFT breast. The biopsy
marking clip is in expected position at the site of biopsy.
IMPRESSION: Appropriate positioning of the cylinder shaped biopsy marking clip
at the site of biopsy in the lower slightly inner quadrant of the
LEFT breast. See MRI biopsy report from same day for more detailed
characterization.

Final Assessment: Post Procedure Mammograms for Marker Placement

## 2019-08-19 IMAGING — MR MR BREAST BX W LOC DEV 1ST LESION IMAGE BX SPEC MR GUIDE*L*
4 of 6 series · 30 of 48 positions shown · IV contrast (10 ml gadavist)
Comparison: Previous exams.
COMPARISON: Previous exams.

Addendum:
CLINICAL DATA: Surgical excision of a LEFT axillary lymph node
demonstrating malignancy a breast origin. Subsequent breast MRI
searching for a breast primary showed an irregular mass in the lower
inner quadrant of the LEFT breast measuring 15 mm. Patient presents
today for MRI-guided biopsy of this LEFT breast mass.

Patient is scheduled for subsequent MRI-guided biopsy of the RIGHT
breast mass tomorrow.
EXAM:
MRI GUIDED CORE NEEDLE BIOPSY OF THE LEFT BREAST
TECHNIQUE: Multiplanar, multisequence MR imaging of the LEFT breast was
performed both before and after administration of intravenous
contrast.
CONTRAST:  10mL GADAVIST GADOBUTROL 1 MMOL/ML IV SOLN

[Series 2: fiducial unilateral · sagittal · 2.0mm · 1.33mm/px · 3 of 56 slices shown]
[im 1/56]
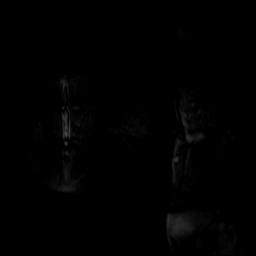
[im 28/56]
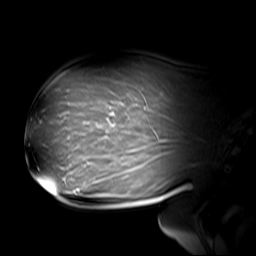
[im 56/56]
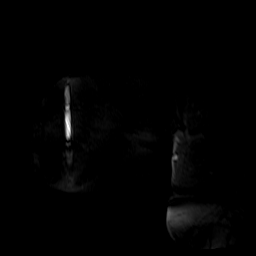

[Series 3: dynamic pre · axial · non-contrast · 1.3mm · 0.73mm/px · z∈[-103,+104]mm · 9 of 160 slices shown]
[im 1/160]
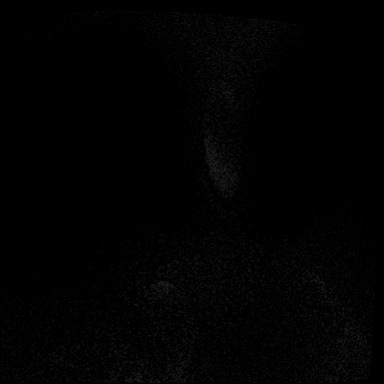
[im 20/160]
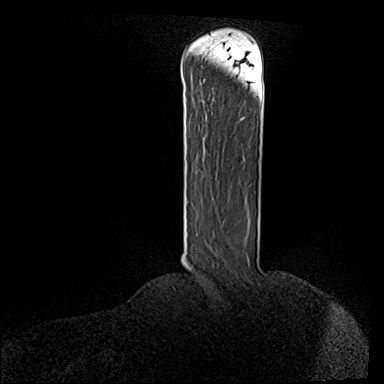
[im 40/160]
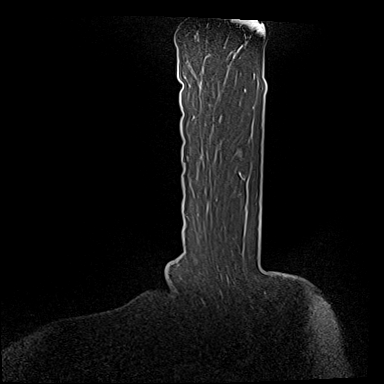
[im 60/160]
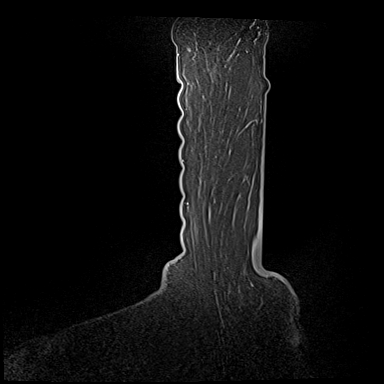
[im 80/160]
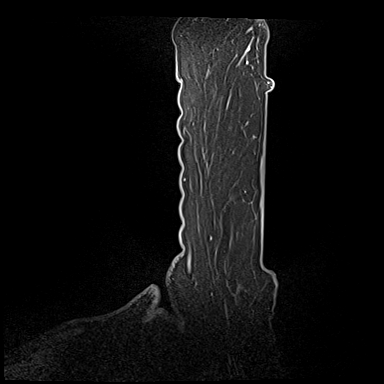
[im 100/160]
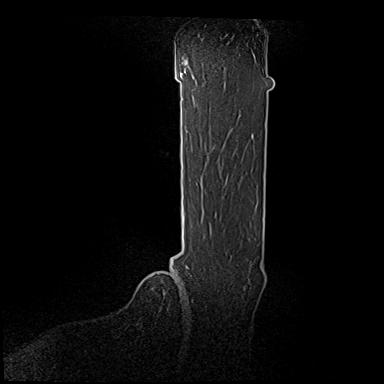
[im 120/160]
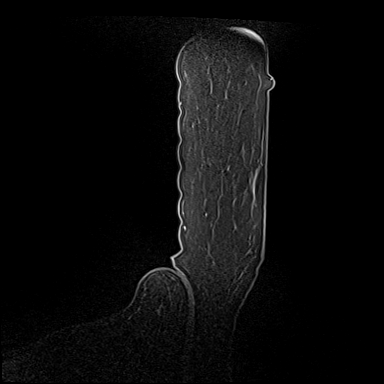
[im 140/160]
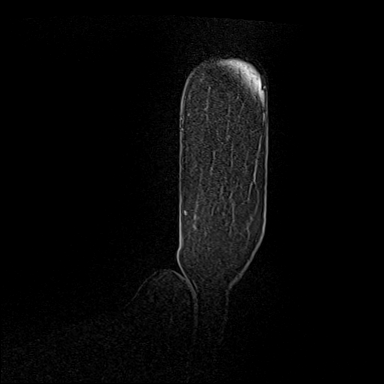
[im 160/160]
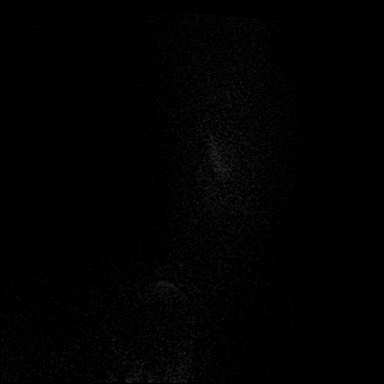

[Series 4: dynamic post 20 · axial · 1.3mm · 0.73mm/px · z∈[-103,+104]mm · 9 of 160 slices shown (1 of 2)]
[im 1/160]
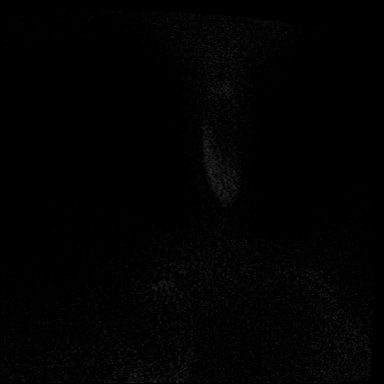
[im 20/160]
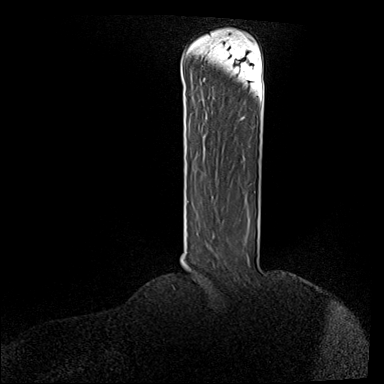
[im 40/160]
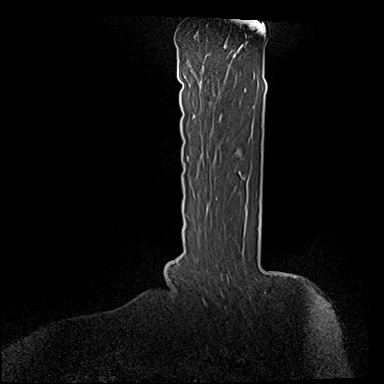
[im 60/160]
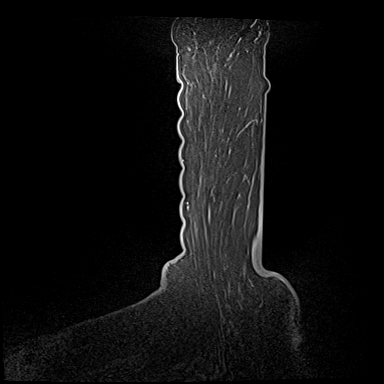
[im 80/160]
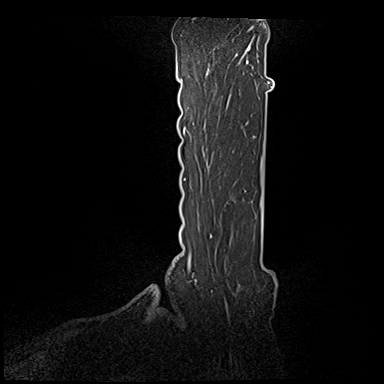
[im 100/160]
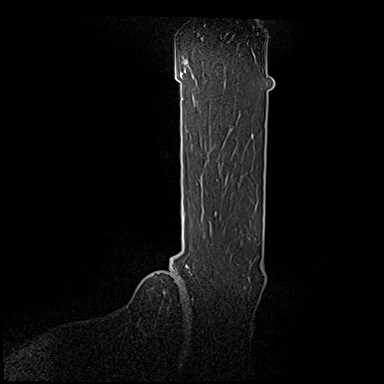
[im 120/160]
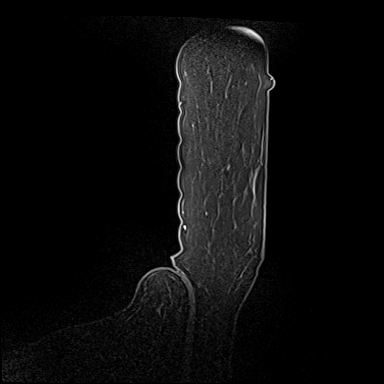
[im 140/160]
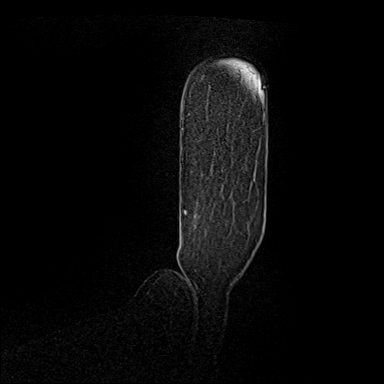
[im 160/160]
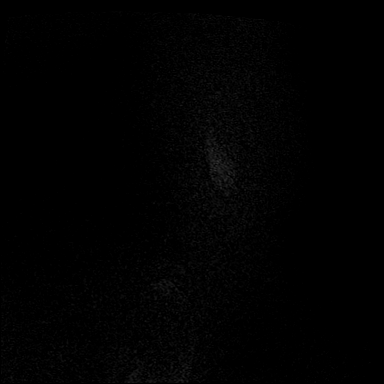

[Series 5: dynamic post 20 · axial · 1.3mm · 0.73mm/px · z∈[-103,+104]mm · 9 of 160 slices shown (2 of 2)]
[im 1/160]
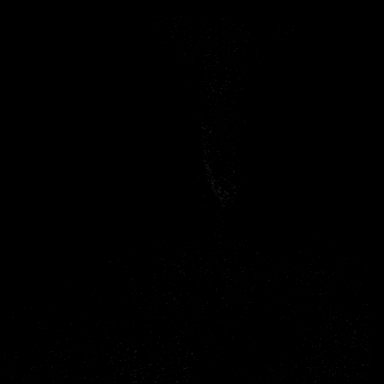
[im 20/160]
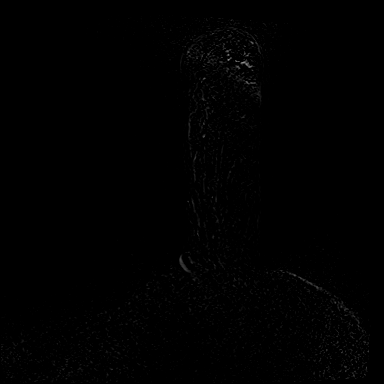
[im 40/160]
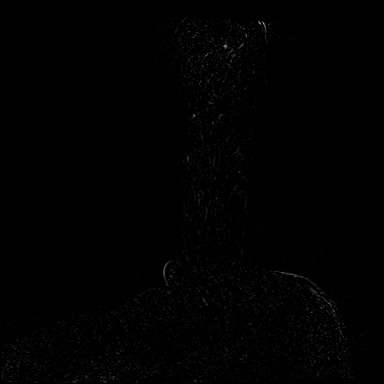
[im 60/160]
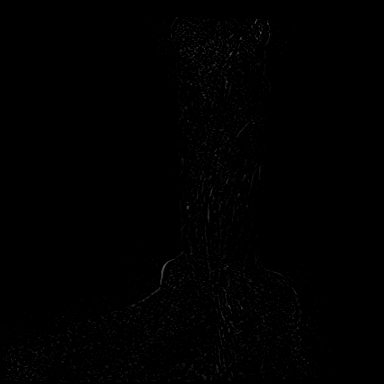
[im 80/160]
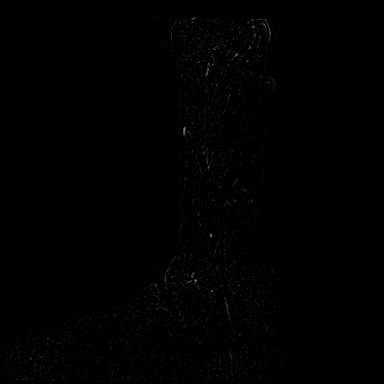
[im 100/160]
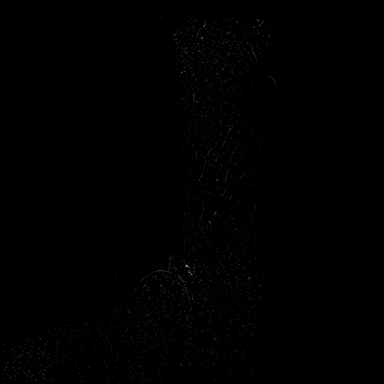
[im 120/160]
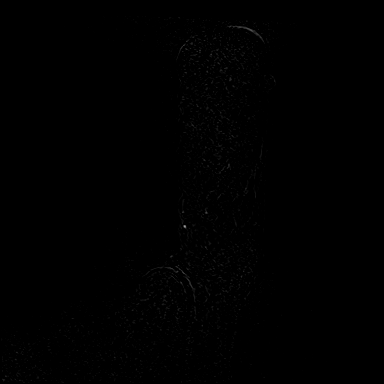
[im 140/160]
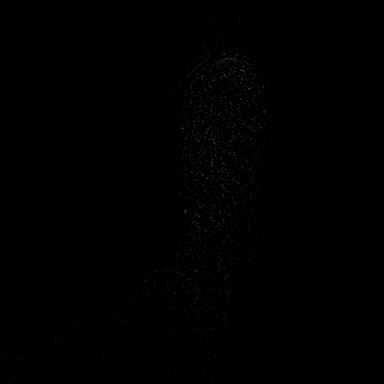
[im 160/160]
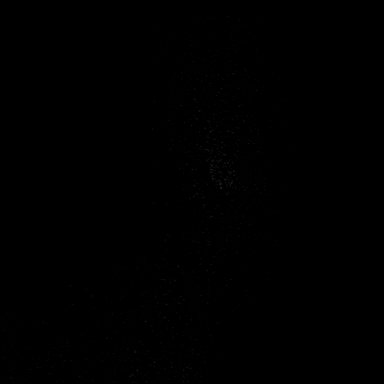

[30 of 48 positions shown; findings below may reference images not displayed]

FINDINGS: I met with the patient, and we discussed the procedure of MRI guided
biopsy, including risks, benefits, and alternatives. Specifically,
we discussed the risks of infection, bleeding, tissue injury, clip
migration, and inadequate sampling. Informed, written consent was
given. The usual time out protocol was performed immediately prior
to the procedure.

Using sterile technique, 1% Lidocaine, MRI guidance, and a 9 gauge
vacuum assisted device, preprocedure image identified the irregular
mass demonstrated on previous MRI of [DATE]. However, because of
the location of the mass and patient's breast size, the mass was not
able to be localized within the biopsy grid.

As such, I elected to place a clip (barbell shaped) as close to the
mass as I could (approximately 1 cm away) for a potential surgical
excision. After clip placement, and after further review of the
localization images, I felt that the lesion might be just within
reach of the biopsy device so biopsy was performed of the breast
tissue just posterior-superior to the targeted mass using a lateral
approach.

At the conclusion of the procedure, an additional cylinder shaped
tissue marker clip was deployed into the biopsy cavity. Follow-up
2-view mammogram was performed and dictated separately.
IMPRESSION: MRI guided biopsy attempt of the irregular enhancing mass within the
lower inner quadrant of the LEFT breast. Due to the position of the
lesion within patient's breast, and patient's breast size, the
lesion could not be properly positioned within the biopsy grid.
Biopsy sample was obtained with device positioned approximately 1 cm
away from the mass.

Postprocedure mammogram showed that the initially placed barbell
clip (see explanation above) was removed during the biopsy. A
subsequently placed CYLINDER shaped clip is appropriately positioned
within the lower slightly inner quadrant of the LEFT breast.

No apparent complications.

ADDENDUM:
Pathology revealed FIBROCYSTIC CHANGES, BENIGN BREAST TISSUE WITH
MICROCALCIFICATIONS of the LEFT breast, lower inner quadrant. This
was found to be concordant by Dr. KLEVER.

Pathology results were discussed with the patient by telephone. The
patient reported doing well after the biopsy with tenderness at the
site. Post biopsy instructions and care were reviewed and questions
were answered. The patient was encouraged to call The [REDACTED]

Based on postprocedure MRI of [DATE], the LEFT breast biopsy
clip appears to be slightly inferior to the targeted LEFT breast
mass, therefore, surgical excision is recommended to ensure adequate
tissue sampling.

Pathology results reported by KLEVER, RN on [DATE].

*** End of Addendum ***
FINDINGS: I met with the patient, and we discussed the procedure of MRI guided
biopsy, including risks, benefits, and alternatives. Specifically,
we discussed the risks of infection, bleeding, tissue injury, clip
migration, and inadequate sampling. Informed, written consent was
given. The usual time out protocol was performed immediately prior
to the procedure.

Using sterile technique, 1% Lidocaine, MRI guidance, and a 9 gauge
vacuum assisted device, preprocedure image identified the irregular
mass demonstrated on previous MRI of [DATE]. However, because of
the location of the mass and patient's breast size, the mass was not
able to be localized within the biopsy grid.

As such, I elected to place a clip (barbell shaped) as close to the
mass as I could (approximately 1 cm away) for a potential surgical
excision. After clip placement, and after further review of the
localization images, I felt that the lesion might be just within
reach of the biopsy device so biopsy was performed of the breast
tissue just posterior-superior to the targeted mass using a lateral
approach.

At the conclusion of the procedure, an additional cylinder shaped
tissue marker clip was deployed into the biopsy cavity. Follow-up
2-view mammogram was performed and dictated separately.
IMPRESSION: MRI guided biopsy attempt of the irregular enhancing mass within the
lower inner quadrant of the LEFT breast. Due to the position of the
lesion within patient's breast, and patient's breast size, the
lesion could not be properly positioned within the biopsy grid.
Biopsy sample was obtained with device positioned approximately 1 cm
away from the mass.

Postprocedure mammogram showed that the initially placed barbell
clip (see explanation above) was removed during the biopsy. A
subsequently placed CYLINDER shaped clip is appropriately positioned
within the lower slightly inner quadrant of the LEFT breast.

No apparent complications.

## 2019-08-19 MED ORDER — GADOBUTROL 1 MMOL/ML IV SOLN
10.0000 mL | Freq: Once | INTRAVENOUS | Status: AC | PRN
  Administered 2019-08-19: 10 mL via INTRAVENOUS

## 2019-08-19 NOTE — Assessment & Plan Note (Signed)
08/02/2019:Right breast enhancing linear mass 6 x 6 x 13 mm, left breast enhancing irregular mass 5 x 15 x 5 mm, no abnormal appearing lymph nodes 07/12/2019:Left axillary soft tissue excision: Invasive ductal carcinoma lymphoid tissue not present

## 2019-08-19 NOTE — Progress Notes (Signed)
Location of Breast Cancer: Left Axillary  Histology per Pathology Report:  07/12/19 FINAL MICROSCOPIC DIAGNOSIS: A. SOFT TISSUE MASS, LEFT AXILLARY, EXCISION: - Invasive ductal carcinoma of breast. See comment - Lymphoid tissue is not identified  Receptor Status: ER(95%), PR (95%), Her2-neu (POS), Ki-(15%)  Did patient present with symptoms or was this found on screening mammography?: She presented to her PCP with reports of an enlarging left axillary mass. She is not sure how long it had been present but it caused occasional burning and was getting bigger.   Biopsies also completed to: Left breast 08/19/19 Right Breast 08/20/19  Past/Anticipated interventions by surgeon, if any: 07/12/19 Procedure(s): EXCISION LEFT AXILLARY MASS ( 5 cm )  Surgeon(s): Coralie Keens, MD   She is to see Dr. Ninfa Linden on 09/13/19 to talk about future plans regarding treatment.    Past/Anticipated interventions by medical oncology, if any:  08/19/19 Dr. Lindi Adie  Treatment plan: 1.  MRI biopsies of bilateral breasts 2.  Additional surgery depending upon the MRI biopsy results 3.  Oncotype DX to determine if she would benefit from chemotherapy 4.  Adjuvant radiation therapy 5.  Follow-up adjuvant antiestrogen therapy  I discussed with her that the axillary soft tissue mass may be a primary breast cancer and not a lymph node metastases but since there was no lymphatic tissue in the mass. Return to clinic after surgery to discuss the final pathology report.  Lymphedema issues, if any:  She denies.   Pain issues, if any:  She does have soreness to her breasts from the MRI, she is using ice packs.   SAFETY ISSUES:  Prior radiation? No  Pacemaker/ICD? No  Possible current pregnancy? No  Is the patient on methotrexate? No  Current Complaints / other details:     08/19/2019 And 08/20/19 MRI bilateral Breasts   Hadlyn Amero, Stephani Police, RN 08/19/2019,1:46 PM

## 2019-08-20 ENCOUNTER — Other Ambulatory Visit (HOSPITAL_COMMUNITY): Payer: Self-pay | Admitting: Diagnostic Radiology

## 2019-08-20 ENCOUNTER — Ambulatory Visit
Admission: RE | Admit: 2019-08-20 | Discharge: 2019-08-20 | Disposition: A | Discharge status: Home / Self Care | Payer: Medicare Other | Source: Ambulatory Visit | Attending: Surgery | Admitting: Surgery

## 2019-08-20 ENCOUNTER — Telehealth: Payer: Self-pay | Admitting: *Deleted

## 2019-08-20 DIAGNOSIS — R9389 Abnormal findings on diagnostic imaging of other specified body structures: Secondary | ICD-10-CM

## 2019-08-20 IMAGING — MG MM BREAST LOCALIZATION CLIP
4 series · 4 of 12 positions shown · non-contrast
Comparison: Previous exam(s).

CLINICAL DATA: Status post MR guided core biopsy of enhancing mass
in the anteromedial RIGHT breast.

EXAM:
DIAGNOSTIC RIGHT MAMMOGRAM POST MRI BIOPSY

[R CC synth-2D]
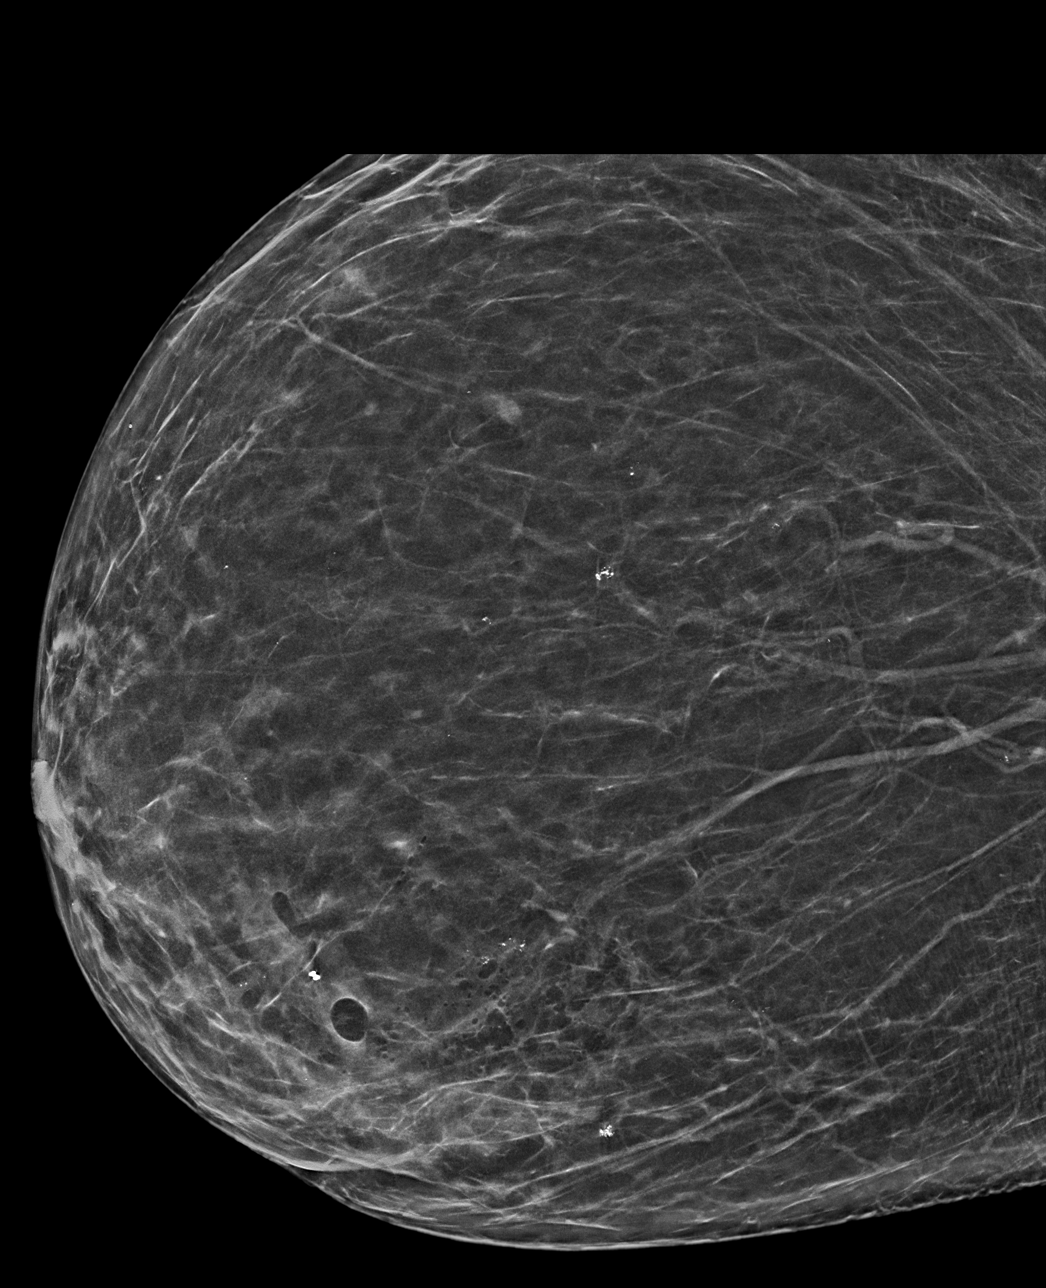

[R ML synth-2D]
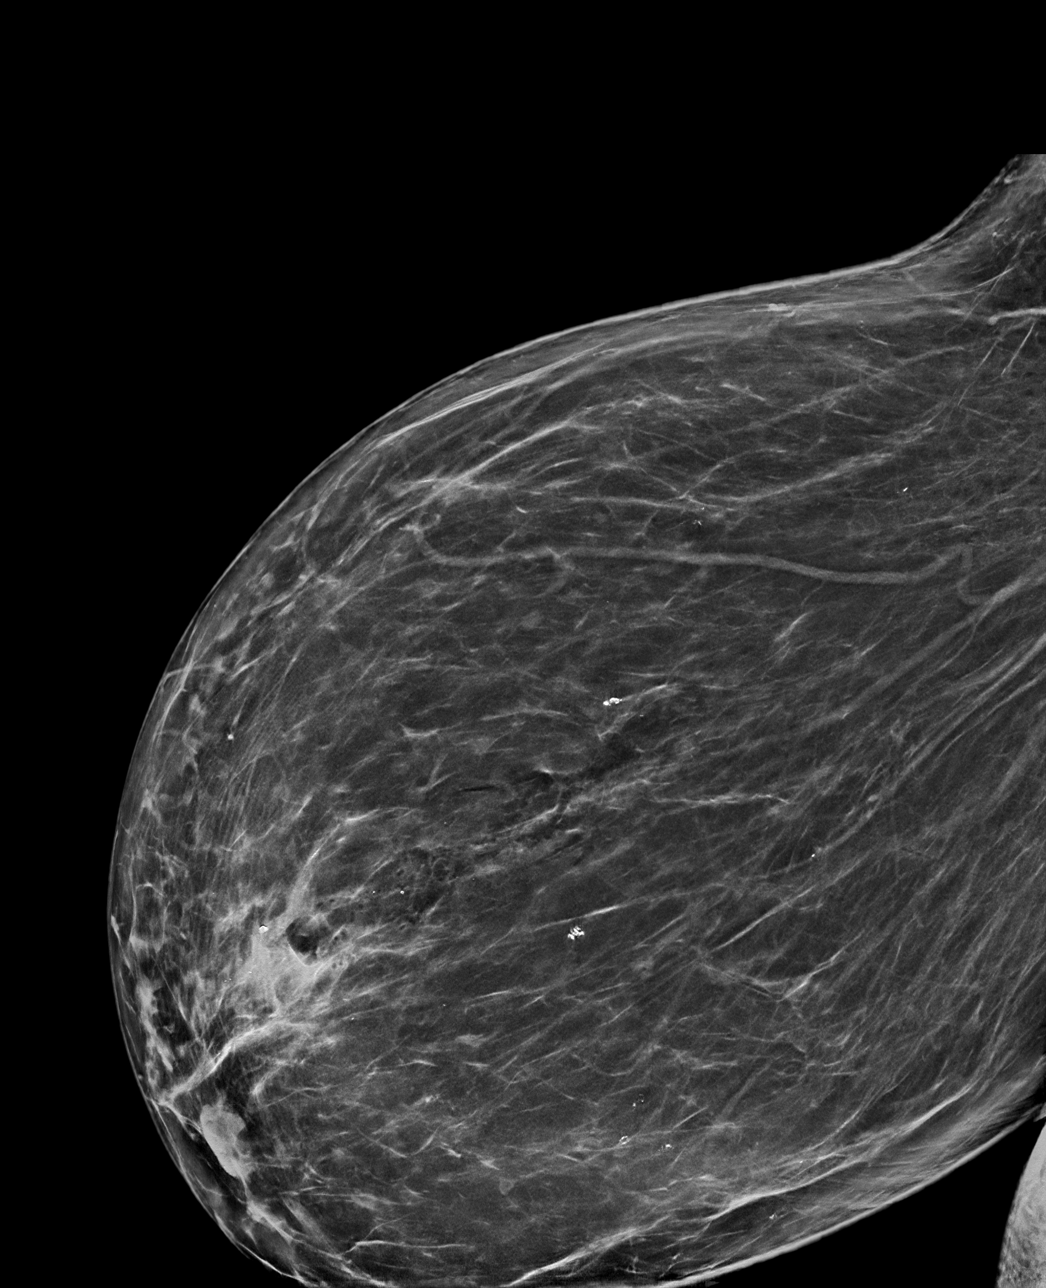

[R CC tomo · tomo slice 34/67.0]
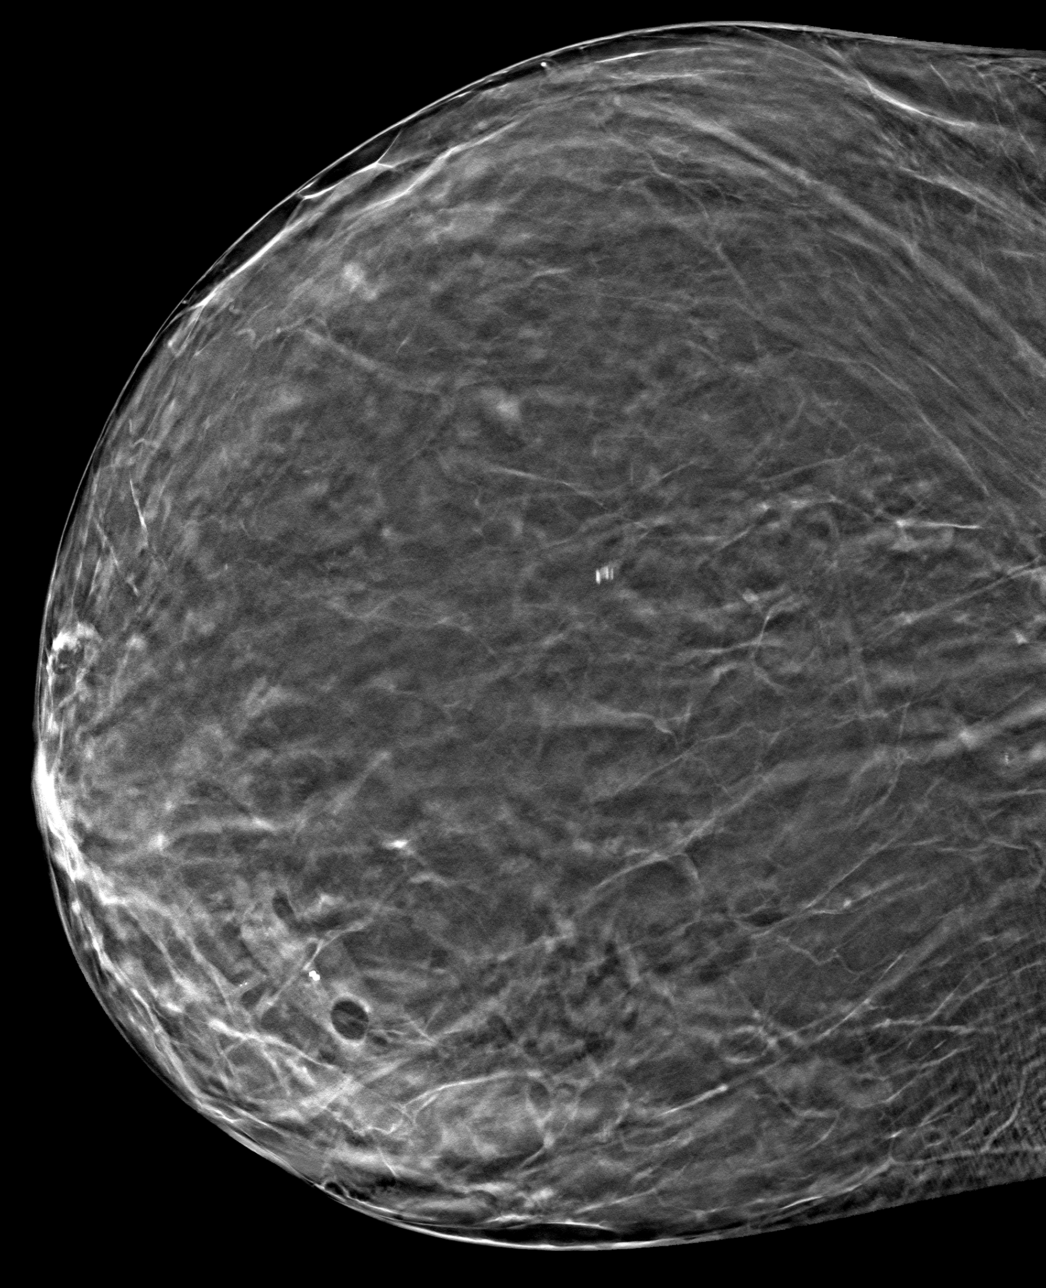

[R ML tomo · tomo slice 41/80.0]
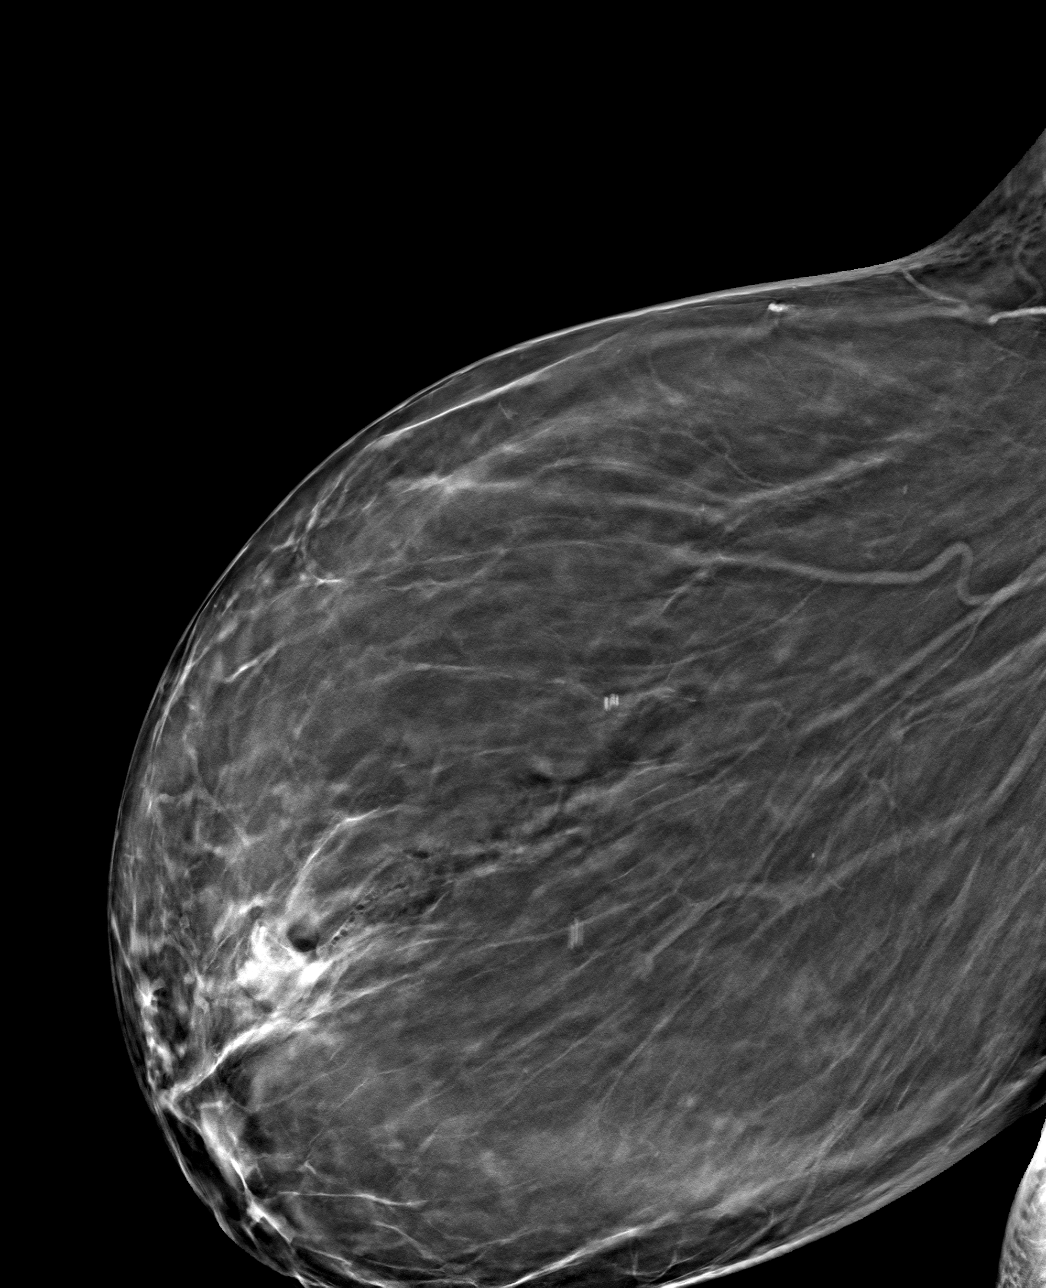

[4 of 12 positions shown; findings below may reference images not displayed]

FINDINGS: Mammographic images were obtained following MRI guided biopsy of
enhancing mass in the anteromedial RIGHT breast. The biopsy marking
clip is in expected position at the site of biopsy.
IMPRESSION: Appropriate positioning of the barbell shaped biopsy marking clip at
the site of biopsy in the anteromedial RIGHT breast.

Final Assessment: Post Procedure Mammograms for Marker Placement

## 2019-08-20 IMAGING — MR MR BREAST BX W/ LOC DEV 1ST LEASION IMAGE BX SPEC MR GUIDE*R*
9 of 12 series · 33 of 48 positions shown · IV contrast (10 ml Gadavist)
Comparison: Previous exams.
COMPARISON: Previous exams.

Addendum:
CLINICAL DATA: Patient presents for MR guided core biopsy of mass
in the anteromedial RIGHT breast. Recent excision of LEFT axillary
lymph node showed malignancy of breast origin. MR guided core biopsy
was performed of a LEFT breast yesterday.

EXAM:
MRI GUIDED CORE NEEDLE BIOPSY OF THE RIGHT BREAST
TECHNIQUE: Multiplanar, multisequence MR imaging of the RIGHT breast was
performed both before and after administration of intravenous
contrast.
CONTRAST:  10mL GADAVIST GADOBUTROL 1 MMOL/ML IV SOLN

[Series 2: fiducial unilateral · sagittal · 2.0mm · 1.33mm/px · 2 of 60 slices shown]
[im 1/60]
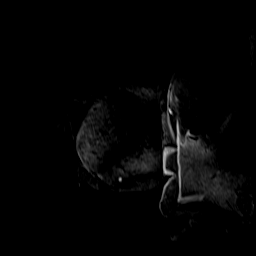
[im 60/60]
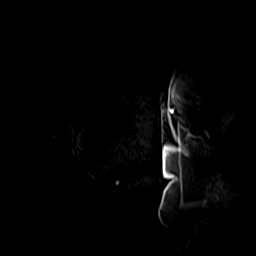

[Series 3: dynamic pre · axial · non-contrast · 1.3mm · 0.73mm/px · z∈[-54,+173]mm · 5 of 176 slices shown]
[im 1/176]
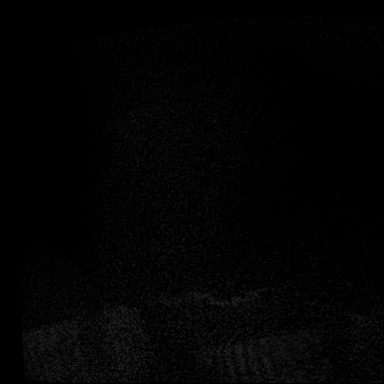
[im 44/176]
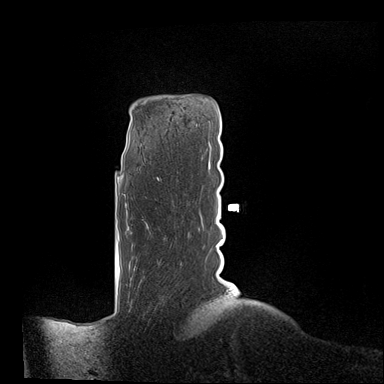
[im 88/176]
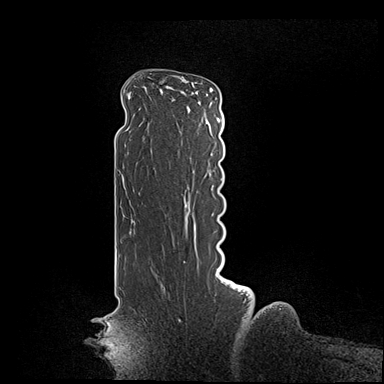
[im 132/176]
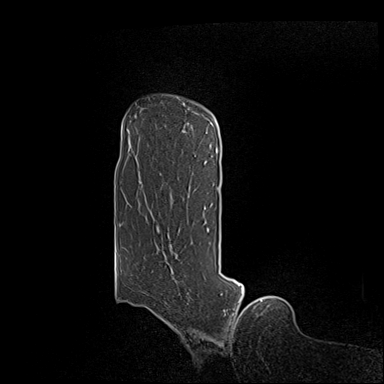
[im 176/176]
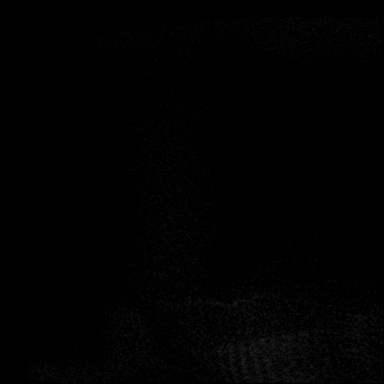

[Series 4: dynamic post 20 · axial · 1.3mm · 0.73mm/px · z∈[-54,+173]mm · 4 of 175 slices shown (1 of 2)]
[im 1/175]
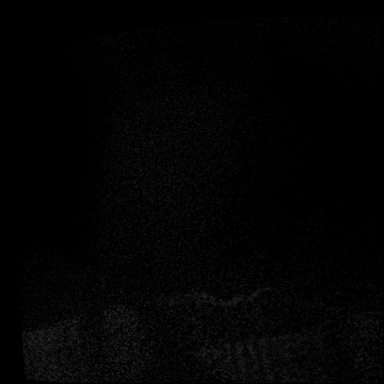
[im 59/175]
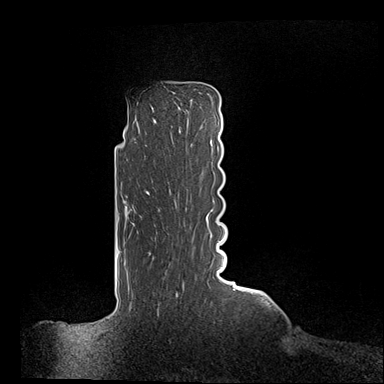
[im 117/175]
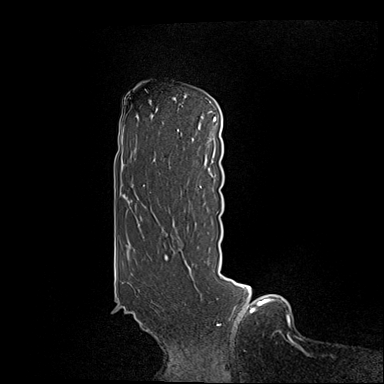
[im 175/175]
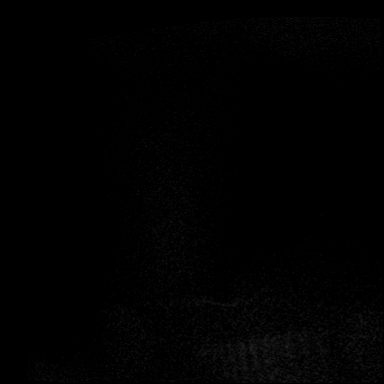

[Series 5: dynamic post 20 · axial · 1.3mm · 0.73mm/px · z∈[-54,+173]mm · 4 of 176 slices shown (2 of 2)]
[im 1/176]
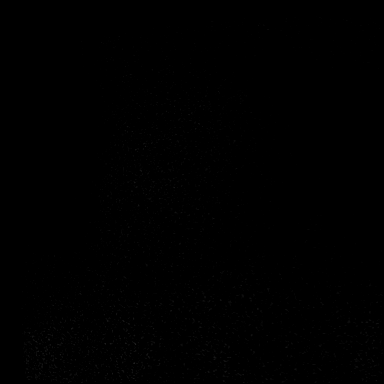
[im 59/176]
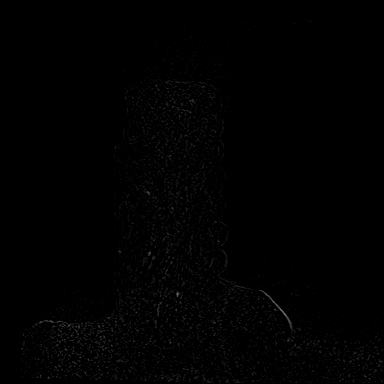
[im 117/176]
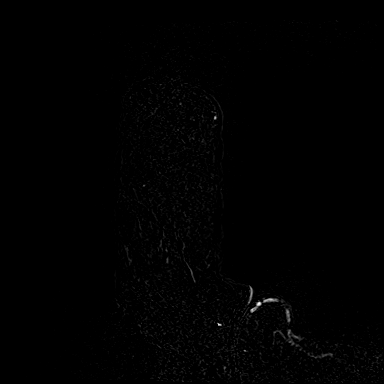
[im 176/176]
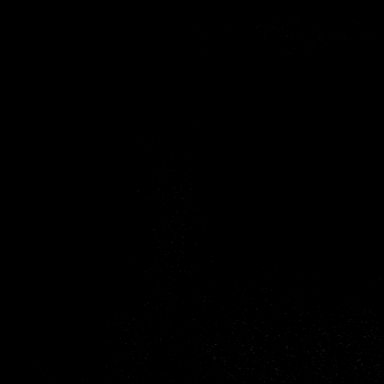

[Series 6: dynamic post 3 · axial · 1.3mm · 0.73mm/px · z∈[-54,+173]mm · 4 of 170 slices shown (1 of 2)]
[im 1/170]
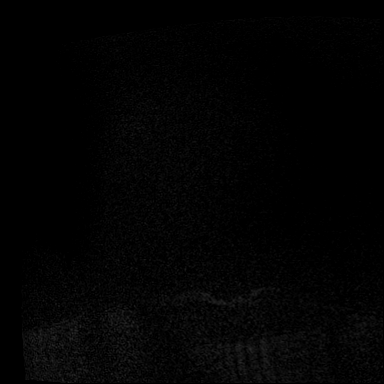
[im 57/170]
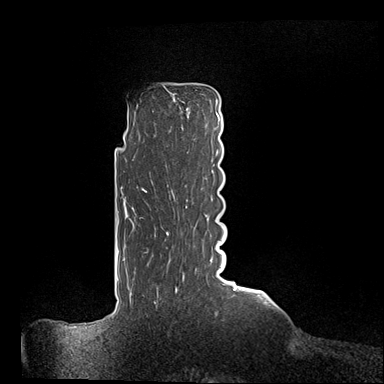
[im 113/170]
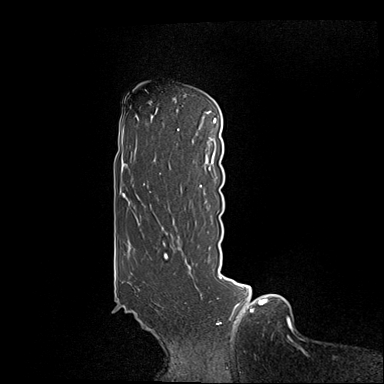
[im 170/170]
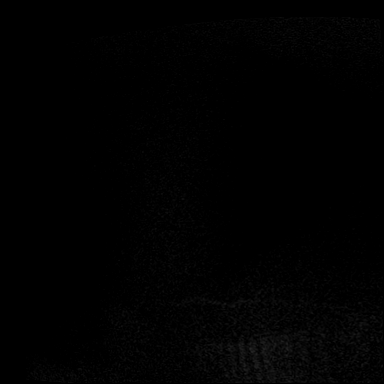

[Series 7: dynamic post 3 · axial · 1.3mm · 0.73mm/px · z∈[-54,+173]mm · 4 of 176 slices shown (2 of 2)]
[im 1/176]
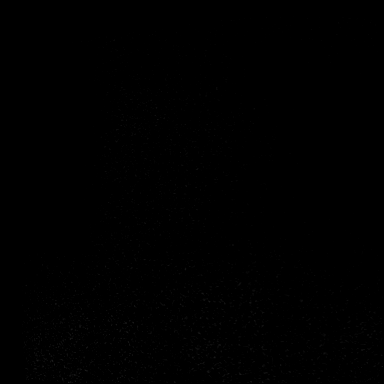
[im 59/176]
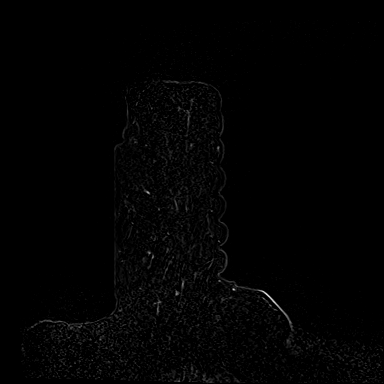
[im 117/176]
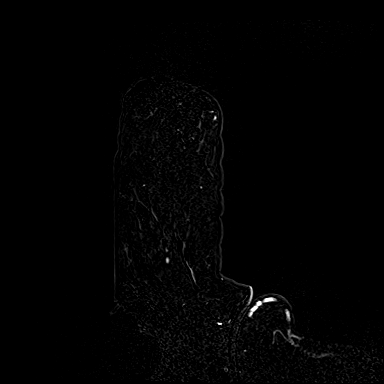
[im 176/176]
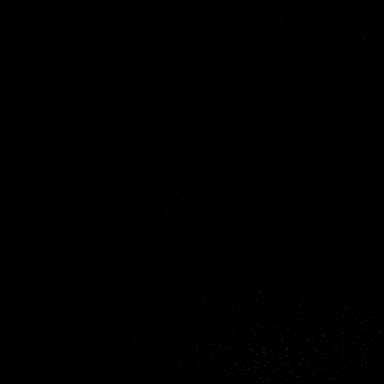

[Series 8: needle confirmation · axial · 1.3mm · 0.73mm/px · z∈[-54,+173]mm · 4 of 174 slices shown]
[im 1/174]
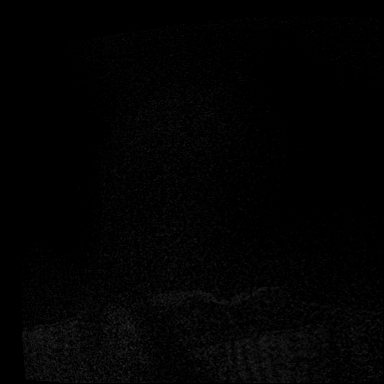
[im 58/174]
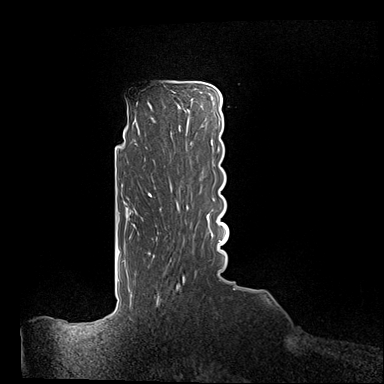
[im 116/174]
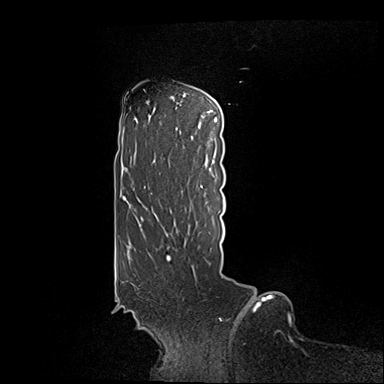
[im 174/174]
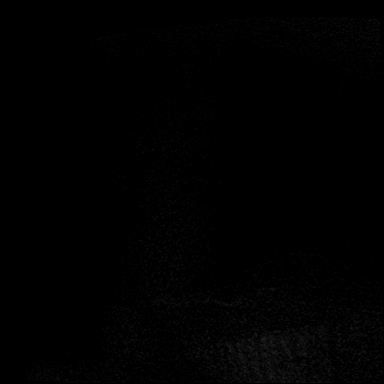

[Series 9: needle confirmation_sub · axial · 1.3mm · 0.73mm/px · z∈[-54,+173]mm · 4 of 174 slices shown]
[im 1/174]
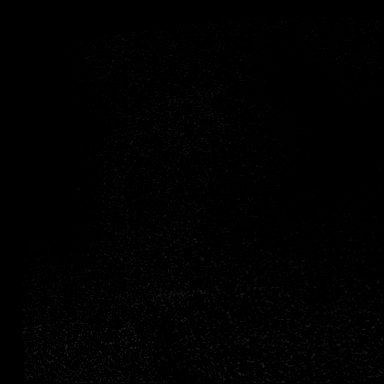
[im 58/174]
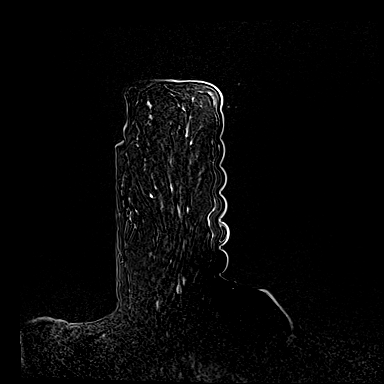
[im 116/174]
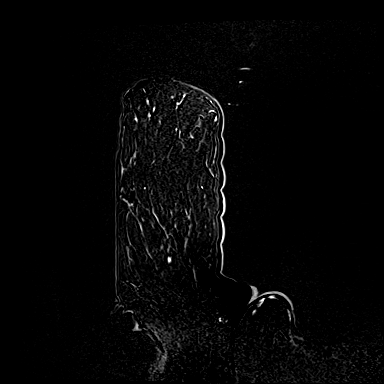
[im 174/174]
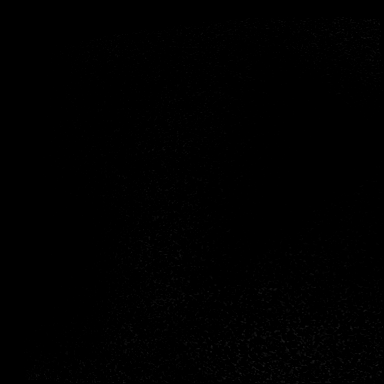

[Series 10: post bx · axial · 1.3mm · 0.73mm/px · z∈[-54,+21]mm · 2 of 176 slices shown]
[im 1/176]
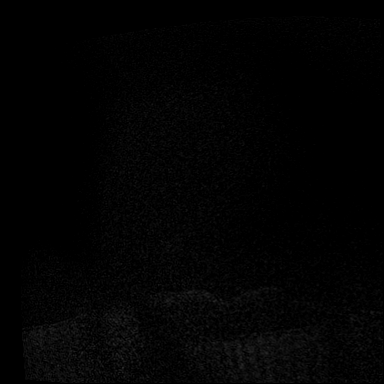
[im 59/176]
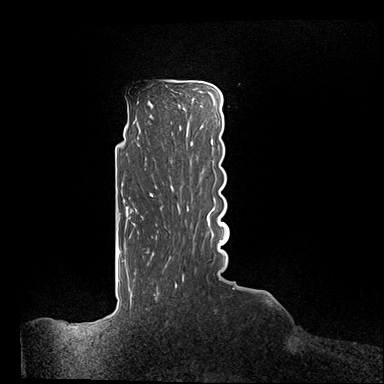

[33 of 48 positions shown; findings below may reference images not displayed]

FINDINGS: I met with the patient, and we discussed the procedure of MRI guided
biopsy, including risks, benefits, and alternatives. Specifically,
we discussed the risks of infection, bleeding, tissue injury, clip
migration, and inadequate sampling. Informed, written consent was
given. The usual time out protocol was performed immediately prior
to the procedure.

Using sterile technique, 1% Lidocaine, MRI guidance, and a 9 gauge
vacuum assisted device, biopsy was performed of mass in the anterior
MEDIAL RIGHT breast using a MEDIAL to LATERAL approach. At the
conclusion of the procedure, a barbell shaped tissue marker clip was
deployed into the biopsy cavity. Follow-up 2-view mammogram was
performed and dictated separately.
IMPRESSION: MRI guided biopsy of RIGHT breast mass.  No apparent complications.

ADDENDUM:
Pathology revealed DUCTAL PAPILLOMA of the RIGHT breast, anterior
medial linear enhancement, barbell clip. This was found to be
concordant by Dr. KSE, with excision recommended.

Pathology results were discussed with the patient by telephone. The
patient reported doing well after the biopsy with tenderness at the
site. Post biopsy instructions and care were reviewed and questions
were answered. The patient was encouraged to call The [REDACTED]

Per recommendation of Dr. KSE, surgical excision is recommended
of the LEFT breast. The patient has a recent diagnosis of INVASIVE
DUCTAL CARCINOMA from excision of a left axillary node.

The patient is scheduled to see Dr. KSE on [DATE].

Pathology results reported by KSE, RN on [DATE].

*** End of Addendum ***
FINDINGS: I met with the patient, and we discussed the procedure of MRI guided
biopsy, including risks, benefits, and alternatives. Specifically,
we discussed the risks of infection, bleeding, tissue injury, clip
migration, and inadequate sampling. Informed, written consent was
given. The usual time out protocol was performed immediately prior
to the procedure.

Using sterile technique, 1% Lidocaine, MRI guidance, and a 9 gauge
vacuum assisted device, biopsy was performed of mass in the anterior
MEDIAL RIGHT breast using a MEDIAL to LATERAL approach. At the
conclusion of the procedure, a barbell shaped tissue marker clip was
deployed into the biopsy cavity. Follow-up 2-view mammogram was
performed and dictated separately.
IMPRESSION: MRI guided biopsy of RIGHT breast mass.  No apparent complications.

## 2019-08-20 MED ORDER — GADOBUTROL 1 MMOL/ML IV SOLN
10.0000 mL | Freq: Once | INTRAVENOUS | Status: AC | PRN
  Administered 2019-08-20: 10 mL via INTRAVENOUS

## 2019-08-20 NOTE — Telephone Encounter (Signed)
Received order for oncoytpe testing. Requisition faxed to pathology and [facility] 

## 2019-08-24 LAB — SURGICAL PATHOLOGY

## 2019-08-24 NOTE — Progress Notes (Signed)
Radiation Oncology         [phone] ________________________________  Initial outpatient Consultation by telephone as patient was unable to access MyChart video during pandemic precautions  Name: Connie Marshall MRN: [id number]  Date: 08/25/2019  DOB: 30-Jan-1946  MK:LKJZ, Hunt Oris, MD  Coralie Keens, MD   REFERRING PHYSICIAN: Coralie Keens, MD  DIAGNOSIS:    ICD-10-CM   1. Carcinoma of upper-outer quadrant of left breast in female, estrogen receptor positive ([place])  C50.412    Z17.0    Cancer Staging Bilateral breast cancer ([place]) Staging form: Breast, AJCC 8th Edition - Clinical stage from 08/19/2019: Stage IIA (cT3, cN0, cM0, G2, ER+, PR+, HER2-) - Signed by Nicholas Lose, MD on 08/19/2019  Carcinoma of upper-outer quadrant of left breast in female, estrogen receptor positive ([place]) Staging form: Breast, AJCC 8th Edition - Clinical: Stage IIA (cT3, cN0, cM0, G2, ER+, PR+, HER2-) - Signed by Eppie Gibson, MD on 08/25/2019  CHIEF COMPLAINT: Here to discuss management of LEFT breast cancer  HISTORY OF PRESENT ILLNESS::Connie Marshall is a 73 y.o. female who presented with an enlarging left lateral chest wall mass, >5cm, just below the axilla, that had gradually enlarged over 2 months. She noted a history of left axilla infections from cysts. She has normal mammography earlier this year in the summer. She was referred to Dr. Ninfa Linden.  She proceeded to excision of the left axilla mass on 07/12/2019. Pathology from the procedure showed invasive ductal carcinoma of the breast, with no lymphoid tissue identified. ER status: positive; PR status positive, Her2 status negative.  Per med/onc documentation, this is Grade 2. Path reports  Proliferation Marker Ki-67:  15%.  Mass (tumor) was 6cm.  She underwent breast MRI on 08/02/2019, which showed bilateral indeterminate enhancing masses in the lower inner quadrants.   She was referred to Dr. Lindi Marshall on 08/19/2019, who recommended MRI biopsies  of bilateral breasts, possible additional surgery depending upon biopsy results, and oncotype testing.  Biopsy of the left breast on 08/19/2019 showed fibrocystic changes and benign breast tissue with microcacificatons. Biopsy of the right breast was performed the following day and showed ductal papilloma. Recommendation was for left breast lumpectomy in light of the left biopsy clip appearing slightly inferior to the targeted left breast mass, as well as right lumpectomy.  She is scheduled to return to Dr. Ninfa Linden on 09/13/2019. Oncotype results are pending.   PREVIOUS RADIATION THERAPY: No  PAST MEDICAL HISTORY:  has a past medical history of Axillary mass, left, Complication of anesthesia, Diverticulosis, Hemorrhoids, Hyperlipidemia, Hypertension, IBS (irritable bowel syndrome), Obesity, PONV (postoperative nausea and vomiting), and Pre-diabetes.    PAST SURGICAL HISTORY: Past Surgical History:  Procedure Laterality Date   CESAREAN SECTION     2 times   CHOLECYSTECTOMY     laproscopic   CHOLECYSTECTOMY, LAPAROSCOPIC     kidney stone removed  2011   MASS EXCISION Left 07/12/2019   Procedure: EXCISION LEFT AXILLARY MASS;  Surgeon: Coralie Keens, MD;  Location: [facility];  Service: General;  Laterality: Left;   THYROGLOSSAL DUCT CYST  2000   TONSILLECTOMY  7915   umbilical herniorrhapy     VAGINAL HYSTERECTOMY  2003    FAMILY HISTORY: family history includes Diabetes in her father; Hypertension in her father and mother; Parkinson's disease in her sister; Prostate cancer in her brother.  SOCIAL HISTORY:  reports that she has never smoked. She has never used smokeless tobacco. She reports that she does not drink alcohol or use  drugs.  ALLERGIES: Tape  MEDICATIONS:  Current Outpatient Medications  Medication Sig Dispense Refill   fosinopril (MONOPRIL) 20 MG tablet Take 1 tablet (20 mg total) by mouth daily. 90 tablet 3   furosemide (LASIX) 40 MG tablet  1 tab by mouth in the AM, and 1 in the PM as needed for leg swelling 180 tablet 3   NON FORMULARY [employer] extra strength-Take prn     simvastatin (ZOCOR) 20 MG tablet TAKE 1 TABLET BY MOUTH EVERY EVENING 30 tablet 11   traMADol (ULTRAM) 50 MG tablet Take 1 tablet (50 mg total) by mouth every 6 (six) hours as needed for moderate pain or severe pain. 20 tablet 0   VIT B12-METHIONINE-INOS-CHOL IM Inject into the muscle every 30 (thirty) days.     Vitamin D, Ergocalciferol, (DRISDOL) 1.25 MG (50000 UT) CAPS capsule Take 1 capsule (50,000 Units total) by mouth every 7 (seven) days. 12 capsule 0   clotrimazole-betamethasone (LOTRISONE) cream Use as directed (Patient not taking: Reported on 08/25/2019) 15 g 1   flurandrenolide (CORDRAN) 0.05 % lotion      halobetasol (ULTRAVATE) 0.05 % ointment      triamcinolone (KENALOG) 0.025 % ointment Apply 1 application topically 2 (two) times daily.     Vitamin D, Cholecalciferol, 25 MCG (1000 UT) TABS Take by mouth.     Current Facility-Administered Medications  Medication Dose Route Frequency Provider Last Rate Last Dose   denosumab (PROLIA) injection 60 mg  60 mg Subcutaneous Once [name], MD        REVIEW OF SYSTEMS: as above   PHYSICAL EXAM:  vitals were not taken for this visit.    NAD  LABORATORY DATA:  Lab Results  Component Value Date   WBC 3.8 (L) 08/05/2019   HGB 14.2 08/05/2019   HCT 42.1 08/05/2019   MCV 88.4 08/05/2019   PLT 240.0 08/05/2019   CMP     Component Value Date/Time   NA 139 08/05/2019 0749   K 4.1 08/05/2019 0749   CL 104 08/05/2019 0749   CO2 25 08/05/2019 0749   GLUCOSE 149 (H) 08/05/2019 0749   GLUCOSE 115 (H) 10/01/2006 0732   BUN 10 08/05/2019 0749   CREATININE 0.80 08/05/2019 0749   CALCIUM 10.2 08/05/2019 0749   PROT 7.3 08/05/2019 0749   ALBUMIN 4.4 08/05/2019 0749   AST 13 08/05/2019 0749   ALT 9 08/05/2019 0749   ALKPHOS 62 08/05/2019 0749   BILITOT 0.5 08/05/2019 0749    GFRNONAA >60 07/08/2019 1004   GFRAA >60 07/08/2019 1004         RADIOGRAPHY: Mr Breast Bilateral W Wo Contrast Inc Cad  Result Date: 08/03/2019 CLINICAL DATA:  73 year old female status post surgical excision of a left axillary lymph node demonstrating malignancy of breast origin. LABS:  None performed today and site. EXAM: BILATERAL BREAST MRI WITH AND WITHOUT CONTRAST TECHNIQUE: Multiplanar, multisequence MR images of both breasts were obtained prior to and following the intravenous administration of 10 ml of Gadavist. Three-dimensional MR images were rendered by post-processing of the original MR data on an independent workstation. The three-dimensional MR images were interpreted, and findings are reported in the following complete MRI report for this study. Three dimensional images were evaluated at the independent DynaCad workstation COMPARISON:  Previous exam(s). FINDINGS: Breast composition: a. Almost entirely fat. Background parenchymal enhancement: Mild-to-moderate. Right breast: There is an irregular, enhancing linear mass in the lower inner quadrant of the right breast at  anterior depth (series 7, images 98 to 106 of 160). It measures approximately 6 x 6 x 13 mm. Otherwise, no mass or abnormal enhancement. Left breast: There is an enhancing, irregular mass in the lower inner quadrant at middle depth (series 7, image 106 of 160). It measures approximately 5 x 15 x 5 mm. Otherwise, no mass or abnormal enhancement. Lymph nodes: No abnormal appearing lymph nodes. Ancillary findings:  None. IMPRESSION: 1. Indeterminate, irregular enhancing mass in the lower inner quadrant of the left breast. Recommendation is for MRI guided biopsy. 2. Indeterminate, enhancing mass in the lower inner quadrant of the right breast. Recommendation is for MRI guided biopsy. RECOMMENDATION: MRI guided biopsies of the bilateral breasts. Given the patient's body habitus and location of the lesions, this will likely need to  be performed on two separate days with a medial approach for each. BI-RADS CATEGORY  4: Suspicious. Electronically Signed   By: Kristopher Oppenheim M.D.   On: 08/03/2019 14:51   Mm Clip Placement Left  Result Date: 08/19/2019 CLINICAL DATA:  Status post MRI guided biopsy attempt of an irregular enhancing mass in the LEFT breast. EXAM: DIAGNOSTIC LEFT MAMMOGRAM POST MRI BIOPSY COMPARISON:  Previous exam(s). FINDINGS: Mammographic images were obtained following MRI guided biopsy attempt of the enhancing mass within the LEFT breast. The biopsy marking clip is in expected position at the site of biopsy. IMPRESSION: Appropriate positioning of the cylinder shaped biopsy marking clip at the site of biopsy in the lower slightly inner quadrant of the LEFT breast. See MRI biopsy report from same day for more detailed characterization. Final Assessment: Post Procedure Mammograms for Marker Placement Electronically Signed   By: Franki Cabot M.D.   On: 08/19/2019 11:50   Mm Clip Placement Right  Result Date: 08/20/2019 CLINICAL DATA:  Status post MR guided core biopsy of enhancing mass in the anteromedial RIGHT breast. EXAM: DIAGNOSTIC RIGHT MAMMOGRAM POST MRI BIOPSY COMPARISON:  Previous exam(s). FINDINGS: Mammographic images were obtained following MRI guided biopsy of enhancing mass in the anteromedial RIGHT breast. The biopsy marking clip is in expected position at the site of biopsy. IMPRESSION: Appropriate positioning of the barbell shaped biopsy marking clip at the site of biopsy in the anteromedial RIGHT breast. Final Assessment: Post Procedure Mammograms for Marker Placement Electronically Signed   By: Nolon Nations M.D.   On: 08/20/2019 10:03   Mr Aundra Millet Breast Bx Johnella Moloney Dev 1st Lesion Image Bx Spec Mr Guide  Addendum Date: 08/23/2019   ADDENDUM REPORT: 08/23/2019 08:26 ADDENDUM: Pathology revealed FIBROCYSTIC CHANGES, BENIGN BREAST TISSUE WITH MICROCALCIFICATIONS of the LEFT breast, lower inner quadrant. This  was found to be concordant by Dr. Franki Cabot. Pathology results were discussed with the patient by telephone. The patient reported doing well after the biopsy with tenderness at the site. Post biopsy instructions and care were reviewed and questions were answered. The patient was encouraged to call [facility] for any additional concerns. Based on postprocedure MRI of 08/20/2019, the LEFT breast biopsy clip appears to be slightly inferior to the targeted LEFT breast mass, therefore, surgical excision is recommended to ensure adequate tissue sampling. Pathology results reported by Connie Acres, RN on 08/23/2019. Electronically Signed   By: Franki Cabot M.D.   On: 08/23/2019 08:26   Result Date: 08/23/2019 CLINICAL DATA:  Surgical excision of a LEFT axillary lymph node demonstrating malignancy a breast origin. Subsequent breast MRI searching for a breast primary showed an irregular mass in the lower  inner quadrant of the LEFT breast measuring 15 mm. Patient presents today for MRI-guided biopsy of this LEFT breast mass. Patient is scheduled for subsequent MRI-guided biopsy of the RIGHT breast mass tomorrow. EXAM: MRI GUIDED CORE NEEDLE BIOPSY OF THE LEFT BREAST TECHNIQUE: Multiplanar, multisequence MR imaging of the LEFT breast was performed both before and after administration of intravenous contrast. CONTRAST:  58m GADAVIST GADOBUTROL 1 MMOL/ML IV SOLN COMPARISON:  Previous exams. FINDINGS: I met with the patient, and we discussed the procedure of MRI guided biopsy, including risks, benefits, and alternatives. Specifically, we discussed the risks of infection, bleeding, tissue injury, clip migration, and inadequate sampling. Informed, written consent was given. The usual time out protocol was performed immediately prior to the procedure. Using sterile technique, 1% Lidocaine, MRI guidance, and a 9 gauge vacuum assisted device, preprocedure image identified the irregular mass  demonstrated on previous MRI of 08/02/2019. However, because of the location of the mass and patient's breast size, the mass was not able to be localized within the biopsy grid. As such, I elected to place a clip (barbell shaped) as close to the mass as I could (approximately 1 cm away) for a potential surgical excision. After clip placement, and after further review of the localization images, I felt that the lesion might be just within reach of the biopsy device so biopsy was performed of the breast tissue just posterior-superior to the targeted mass using a lateral approach. At the conclusion of the procedure, an additional cylinder shaped tissue marker clip was deployed into the biopsy cavity. Follow-up 2-view mammogram was performed and dictated separately. IMPRESSION: MRI guided biopsy attempt of the irregular enhancing mass within the lower inner quadrant of the LEFT breast. Due to the position of the lesion within patient's breast, and patient's breast size, the lesion could not be properly positioned within the biopsy grid. Biopsy sample was obtained with device positioned approximately 1 cm away from the mass. Postprocedure mammogram showed that the initially placed barbell clip (see explanation above) was removed during the biopsy. A subsequently placed CYLINDER shaped clip is appropriately positioned within the lower slightly inner quadrant of the LEFT breast. No apparent complications. Electronically Signed: By: SFranki CabotM.D. 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 This was found to be concordant by Dr. ENolon Nations with excision recommended. Pathology results were discussed with the patient by telephone. The patient reported doing well after the biopsy with tenderness at  the site. Post biopsy instructions and care were reviewed and questions were answered. The patient was encouraged to [facility]. Per recommendation of Dr. MEnriqueta Shutter surgical excision is recommended of the LEFT breast. The patient has a recent diagnosis of INVASIVE DUCTAL CARCINOMA from excision of a left axillary node. The patient is scheduled to see Dr. DCoralie Keenson September 13, 2019. Pathology results reported by SStacie Acres RN on 08/23/2019. Electronically Signed   By: ENolon NationsM.D.   On: 08/23/2019 15:14   Result Date: 08/23/2019 CLINICAL DATA:  Patient presents for MR guided core biopsy of mass in the anteromedial RIGHT breast. Recent excision of LEFT axillary lymph node showed malignancy of breast origin. MR guided core biopsy was performed of a LEFT breast yesterday. EXAM: MRI GUIDED  CORE NEEDLE BIOPSY OF THE RIGHT BREAST TECHNIQUE: Multiplanar, multisequence MR imaging of the RIGHT breast was performed both before and after administration of intravenous contrast. CONTRAST:  13m GADAVIST GADOBUTROL 1 MMOL/ML IV SOLN COMPARISON:  Previous exams. FINDINGS: I met with the patient, and we discussed the procedure of MRI guided biopsy, including risks, benefits, and alternatives. Specifically, we discussed the risks of infection, bleeding, tissue injury, clip migration, and inadequate sampling. Informed, written consent was given. The usual time out protocol was performed immediately prior to the procedure. Using sterile technique, 1% Lidocaine, MRI guidance, and a 9 gauge vacuum assisted device, biopsy was performed of mass in the anterior MEDIAL RIGHT breast using a MEDIAL to LATERAL approach. At the conclusion of the procedure, a barbell shaped tissue marker clip was deployed into the biopsy cavity. Follow-up 2-view mammogram was performed and dictated separately. IMPRESSION: MRI guided biopsy of RIGHT breast mass.  No apparent  complications. Electronically Signed: By: ENolon NationsM.D. On: 08/20/2019 10:01      IMPRESSION/PLAN: Left breast cancer  Patient anticipates lumpectomies. I recommend she undergo left SLN biopsy as well.  I have sent a note to Dr. GLindi Adieto explore whether Staging scans are advised. This may depend on her Oncotype test results.   It was a pleasure meeting the patient today. We discussed the risks, benefits, and side effects of radiotherapy. I recommend radiotherapy to the left breast and possibly her nodes as well - this will depend upon SLN bx results and whether pathology is confident the axillary mass was a primary vs node replaced by tumor. I have contacted the team about her case. Anticipate up to 6wks of RT.  If chemotherapy were to be given, this would precede radiotherapy. We spoke about acute effects including skin irritation and fatigue as well as much less common late effects including internal organ injury or irritation. We spoke about the latest technology that is used to minimize the risk of late effects for patients undergoing radiotherapy to the breast or chest wall. No guarantees of treatment were given. The patient is enthusiastic about proceeding with treatment. I look forward to participating in the patient's care.  I will await her referral back to me for postoperative follow-up and eventual CT simulation/treatment planning.  This encounter was provided by telemedicine platform by telephone as patient was unable to access MyChart video during pandemic precautions The patient has given verbal consent for this type of encounter and has been advised to only accept a meeting of this type in a secure network environment. The time spent during this encounter was 25 minutes. The attendants for this meeting include SEppie Gibson and LIleene Rubens  During the encounter, [facility]was [facility].  LKrystl Wickwarewas located at  home.  __________________________________________   SEppie Gibson MD   This document serves as a record of services personally performed by SEppie Gibson MD. It was created on her behalf by KWilburn Mylar a trained medical scribe. The creation of this record is based on the scribe's personal observations and the provider's statements to them. This document has been checked and approved by the attending provider.

## 2019-08-25 ENCOUNTER — Ambulatory Visit
Admission: RE | Admit: 2019-08-25 | Discharge: 2019-08-25 | Disposition: A | Discharge status: Home / Self Care | Payer: Medicare Other | Source: Ambulatory Visit | Attending: Radiation Oncology | Admitting: Radiation Oncology

## 2019-08-25 ENCOUNTER — Other Ambulatory Visit: Payer: Self-pay

## 2019-08-25 ENCOUNTER — Encounter: Payer: Self-pay | Admitting: Radiation Oncology

## 2019-08-25 DIAGNOSIS — C50612 Malignant neoplasm of axillary tail of left female breast: Secondary | ICD-10-CM

## 2019-08-25 DIAGNOSIS — Z170 Estrogen receptor positive status [ER+]: Secondary | ICD-10-CM | POA: Insufficient documentation

## 2019-08-25 DIAGNOSIS — C50412 Malignant neoplasm of upper-outer quadrant of left female breast: Secondary | ICD-10-CM | POA: Insufficient documentation

## 2019-08-25 NOTE — Progress Notes (Signed)
Location of Breast Cancer: Left Axillary  Histology per Pathology Report:  07/12/19 FINAL MICROSCOPIC DIAGNOSIS: A. SOFT TISSUE MASS, LEFT AXILLARY, EXCISION: - Invasive ductal carcinoma of breast. See comment - Lymphoid tissue is not identified  Receptor Status: ER(95%), PR (95%), Her2-neu (POS), Ki-(15%)  08/19/19 Diagnosis Breast, left, needle core biopsy, lower inner quadrant - FIBROCYSTIC CHANGES - BENIGN BREAST TISSUE WITH MICROCALCIFICATIONS - NO MALIGNANCY IDENTIFIED  08/20/19 Diagnosis Breast, right, needle core biopsy, anterior medial linear enhancement, barbell clip - DUCTAL PAPILLOMA.  Did patient present with symptoms or was this found on screening mammography?: She presented to her PCP with reports of an enlarging left axillary mass. She is not sure how long it had been present but it caused occasional burning and was getting bigger.   Biopsies also completed to: Left breast 08/19/19 Right Breast 08/20/19  Past/Anticipated interventions by surgeon, if any: 07/12/19 Procedure(s): EXCISION LEFT AXILLARY MASS ( 5 cm )  Surgeon(s): Coralie Keens, MD   She is to see Dr. Ninfa Linden on 09/13/19 to talk about future plans regarding treatment.    Past/Anticipated interventions by medical oncology, if any:  08/19/19 Dr. Lindi Adie  Treatment plan: 1.  MRI biopsies of bilateral breasts 2.  Additional surgery depending upon the MRI biopsy results 3.  Oncotype DX to determine if she would benefit from chemotherapy 4.  Adjuvant radiation therapy 5.  Follow-up adjuvant antiestrogen therapy  I discussed with her that the axillary soft tissue mass may be a primary breast cancer and not a lymph node metastases but since there was no lymphatic tissue in the mass. Return to clinic after surgery to discuss the final pathology report.  Lymphedema issues, if any:  She denies.   Pain issues, if any:  She does have soreness to her breasts from the MRI, she is using ice packs.    SAFETY ISSUES:  Prior radiation? No  Pacemaker/ICD? No  Possible current pregnancy? No  Is the patient on methotrexate? No  Current Complaints / other details:     08/19/2019 And 08/20/19 MRI bilateral Breasts   Haygen Zebrowski, Stephani Police, RN 08/25/2019,9:52 AM

## 2019-08-27 NOTE — Progress Notes (Addendum)
Subjective:   Connie Marshall is a 73 y.o. female who presents for Medicare Annual (Subsequent) preventive examination. I connected with patient by a telephone and verified that I am speaking with the correct person using two identifiers. Patient stated full name and DOB. Patient gave permission to continue with telephonic visit. Patient's location was at home and Nurse's location was at [place] office. Participants during this visit included patient and nurse.  Review of Systems:   Cardiac Risk Factors include: advanced age (>51men, >74 women);diabetes mellitus Sleep patterns: feels rested on waking, gets up 0-1 times nightly to void and sleeps 7-8 hours nightly.    Home Safety/Smoke Alarms: Feels safe in home. Smoke alarms in place.  Living environment; residence and Firearm Safety: 1-story house/ trailer. Lives alone, no needs for DME, good support system Seat Belt Safety/Bike Helmet: Wears seat belt.     Objective:     Vitals: There were no vitals taken for this visit.  There is no height or weight on file to calculate BMI.  Advanced Directives 08/25/2019 07/12/2019 07/05/2019 05/25/2016  Does Patient Have a Medical Advance Directive? Yes Yes Yes No  Type of Advance Directive Living will Living will Living will -  Does patient want to make changes to medical advance directive? - No - Guardian declined No - Patient declined -  Copy of [place] in Chart? - No - copy requested No - copy requested -  Would patient like information on creating a medical advance directive? - - - No - patient declined information    Tobacco Social History   Tobacco Use  Smoking Status Never Smoker  Smokeless Tobacco Never Used     Counseling given: Not Answered  Past Medical History:  Diagnosis Date  . Axillary mass, left   . Cancer ([place])   . Complication of anesthesia   . Diverticulosis   . Hemorrhoids   . Hyperlipidemia   . Hypertension   . IBS (irritable bowel syndrome)    . Obesity   . PONV (postoperative nausea and vomiting)   . Pre-diabetes    Past Surgical History:  Procedure Laterality Date  . CESAREAN SECTION     2 times  . CHOLECYSTECTOMY     laproscopic  . CHOLECYSTECTOMY, LAPAROSCOPIC    . kidney stone removed  2011  . MASS EXCISION Left 07/12/2019   Procedure: EXCISION LEFT AXILLARY MASS;  Surgeon: Coralie Keens, MD;  Location: [facility];  Service: General;  Laterality: Left;  . THYROGLOSSAL DUCT CYST  2000  . TONSILLECTOMY  [id number]  . umbilical herniorrhapy    . VAGINAL HYSTERECTOMY  2003   Family History  Problem Relation Age of Onset  . Hypertension Mother   . Diabetes Father   . Hypertension Father   . Parkinson's disease Sister   . Prostate cancer Brother    Social History   Socioeconomic History  . Marital status: Widowed    Spouse name: Not on file  . Number of children: 2  . Years of education: Not on file  . Highest education level: Not on file  Occupational History  . Occupation: Retired  Scientific laboratory technician  . Financial resource strain: Not hard at all  . Food insecurity    Worry: Never true    Inability: Never true  . Transportation needs    Medical: No    Non-medical: No  Tobacco Use  . Smoking status: Never Smoker  . Smokeless tobacco: Never Used  Substance and  Sexual Activity  . Alcohol use: No  . Drug use: No  . Sexual activity: Not Currently  Lifestyle  . Physical activity    Days per week: 4 days    Minutes per session: 30 min  . Stress: Not at all  Relationships  . Social connections    Talks on phone: More than three times a week    Gets together: More than three times a week    Attends religious service: More than 4 times per year    Active member of club or organization: Yes    Attends meetings of clubs or organizations: More than 4 times per year    Relationship status: Widowed  Other Topics Concern  . Not on file  Social History Narrative   HSG, [employer] for 2 years.  Married, '42-2 years- widowed; '76- 17 years/ widowed '93. 1 son- '77, 1 daughter- '78, 4 grandchildren. 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 Lives alone - I- ADLS.    Outpatient Encounter Medications as of 08/30/2019  Medication Sig  . clotrimazole-betamethasone (LOTRISONE) cream Use as directed  . flurandrenolide (CORDRAN) 0.05 % lotion   . fosinopril (MONOPRIL) 20 MG tablet Take 1 tablet (20 mg total) by mouth daily.  . furosemide (LASIX) 40 MG tablet 1 tab by mouth in the AM, and 1 in the PM as needed for leg swelling  . halobetasol (ULTRAVATE) 0.05 % ointment   . NON FORMULARY [employer] Body extra strength-Take prn  . simvastatin (ZOCOR) 20 MG tablet TAKE 1 TABLET BY MOUTH EVERY EVENING  . triamcinolone (KENALOG) 0.025 % ointment Apply 1 application topically 2 (two) times daily.  Marland Kitchen VIT B12-METHIONINE-INOS-CHOL IM Inject into the muscle every 30 (thirty) days.  . Vitamin D, Cholecalciferol, 25 MCG (1000 UT) TABS Take by mouth.  . Vitamin D, Ergocalciferol, (DRISDOL) 1.25 MG (50000 UT) CAPS capsule Take 1 capsule (50,000 Units total) by mouth every 7 (seven) days.  . [DISCONTINUED] traMADol (ULTRAM) 50 MG tablet Take 1 tablet (50 mg total) by mouth every 6 (six) hours as needed for moderate pain or severe pain. (Patient not taking: Reported on 08/30/2019)   Facility-Administered Encounter Medications as of 08/30/2019  Medication  . denosumab (PROLIA) injection 60 mg    Activities of Daily Living In your present state of health, do you have any difficulty performing the following activities: 08/30/2019 07/12/2019  Hearing? N N  Vision? N N  Difficulty concentrating or making decisions? N N  Walking or climbing stairs? N N  Comment - goes slow  Dressing or bathing? N N  Doing errands, shopping? N -  Preparing Food and eating ? N -  Using the Toilet? N -  In the past six months, have you accidently leaked urine? N -  Do you have problems with loss of bowel  control? N -  Managing your Medications? N -  Managing your Finances? N -  Housekeeping or managing your Housekeeping? N -  Some recent data might be hidden    Patient Care Team: [name], MD as PCP - General (Internal Medicine) Kathie Rhodes, MD (Urology) Dyke Maes, [place] (Optometry) Lafayette Dragon, MD (Inactive) (Gastroenterology) Thornell Sartorius, MD (Otolaryngology)    Assessment:   This is a routine wellness examination for [place]. Physical assessment deferred to PCP.  Exercise Activities and Dietary recommendations Current Exercise Habits: Home exercise routine, Time (Minutes): 30, Frequency (Times/Week): 4, Weekly Exercise (Minutes/Week): 120, Intensity: Mild, Exercise limited by: None  identified  Diet (meal preparation, eat out, water intake, caffeinated beverages, dairy products, fruits and vegetables): in general, a "healthy" diet  , well balanced   Reviewed heart healthy and diabetic diet. Encouraged patient to increase daily water and healthy fluid intake.  Goals    . Patient Stated     Maintain current health status.        Fall Risk Fall Risk  08/30/2019 03/15/2019 09/23/2018 09/29/2017 09/20/2016  Falls in the past year? 0 0 0 No No  Number falls in past yr: 0 - - - -  Injury with Fall? 0 - - - -    Depression Screen PHQ 2/9 Scores 08/30/2019 03/15/2019 09/23/2018 09/29/2017  PHQ - 2 Score 0 0 0 0     Cognitive Function       Ad8 score reviewed for issues:  Issues making decisions: no  Less interest in hobbies / activities: no  Repeats questions, stories (family complaining): no  Trouble using ordinary gadgets (microwave, computer, phone):no  Forgets the month or year: no  Mismanaging finances: no  Remembering appts: no  Daily problems with thinking and/or memory: no Ad8 score is= 0  Immunization History  Administered Date(s) Administered  . Fluad Quad(high Dose 65+) 07/05/2019  . Influenza Split 08/05/2011, 08/20/2012  .  Influenza Whole 07/18/2009  . Influenza, High Dose Seasonal PF 09/06/2013, 09/13/2015, 08/12/2016, 07/16/2017, 07/21/2018  . Influenza,inj,Quad PF,6+ Mos 09/07/2014  . Pneumococcal Conjugate-13 09/27/2013  . Pneumococcal Polysaccharide-23 08/05/2011  . Td 10/15/2006  . Tdap 09/29/2017  . Zoster 08/02/2010   Screening Tests Health Maintenance  Topic Date Due  . OPHTHALMOLOGY EXAM  05/12/2019  . FOOT EXAM  09/24/2019  . HEMOGLOBIN A1C  02/03/2020  . MAMMOGRAM  04/29/2020  . COLONOSCOPY  06/10/2024  . TETANUS/TDAP  09/30/2027  . INFLUENZA VACCINE  Completed  . DEXA SCAN  Completed  . Hepatitis C Screening  Completed  . PNA vac Low Risk Adult  Completed      Plan:     Reviewed health maintenance screenings with patient today and relevant education, vaccines, and/or referrals were provided.   I have personally reviewed and noted the following in the patient's chart:   . Medical and social history . Use of alcohol, tobacco or illicit drugs  . Current medications and supplements . Functional ability and status . Nutritional status . Physical activity . Advanced directives . List of other physicians . Vitals . Screenings to include cognitive, depression, and falls . Referrals and appointments  In addition, I have reviewed and discussed with patient certain preventive protocols, quality metrics, and best practice recommendations. A written personalized care plan for preventive services as well as general preventive health recommendations were provided to patient.     Michiel Cowboy, RN  08/30/2019    Medical screening examination/treatment/procedure(s) were performed by non-physician practitioner and as supervising physician I was immediately available for consultation/collaboration. I agree with above. [name], MD

## 2019-08-30 ENCOUNTER — Ambulatory Visit (INDEPENDENT_AMBULATORY_CARE_PROVIDER_SITE_OTHER): Payer: Medicare Other | Admitting: *Deleted

## 2019-08-30 DIAGNOSIS — Z0000 Encounter for general adult medical examination without abnormal findings: Secondary | ICD-10-CM

## 2019-09-02 ENCOUNTER — Telehealth: Payer: Self-pay | Admitting: *Deleted

## 2019-09-02 NOTE — Telephone Encounter (Signed)
Received oncotype results of 0.  Patient is aware.  She is scheduled to see Dr. Ninfa Linden 11/30 to discuss further surgery.

## 2019-09-07 ENCOUNTER — Encounter: Payer: Self-pay | Admitting: Hematology and Oncology

## 2019-09-13 ENCOUNTER — Other Ambulatory Visit: Payer: Self-pay | Admitting: Surgery

## 2019-09-13 ENCOUNTER — Ambulatory Visit: Payer: Medicare Other

## 2019-09-13 DIAGNOSIS — Z853 Personal history of malignant neoplasm of breast: Secondary | ICD-10-CM

## 2019-09-13 DIAGNOSIS — D241 Benign neoplasm of right breast: Secondary | ICD-10-CM

## 2019-09-15 ENCOUNTER — Other Ambulatory Visit: Payer: Self-pay

## 2019-09-15 ENCOUNTER — Ambulatory Visit (INDEPENDENT_AMBULATORY_CARE_PROVIDER_SITE_OTHER): Payer: Medicare Other | Admitting: *Deleted

## 2019-09-15 DIAGNOSIS — E538 Deficiency of other specified B group vitamins: Secondary | ICD-10-CM

## 2019-09-15 MED ORDER — CYANOCOBALAMIN 1000 MCG/ML IJ SOLN
1000.0000 ug | Freq: Once | INTRAMUSCULAR | Status: AC
  Administered 2019-09-15: 1000 ug via INTRAMUSCULAR

## 2019-09-15 NOTE — Progress Notes (Signed)
Medical screening examination/treatment/procedure(s) were performed by non-physician practitioner and as supervising physician I was immediately available for consultation/collaboration. I agree with above. Neriyah Cercone, MD   

## 2019-09-20 ENCOUNTER — Telehealth: Payer: Self-pay | Admitting: Hematology and Oncology

## 2019-09-20 NOTE — Telephone Encounter (Signed)
Scheduled appt per 12/03 sch message - pt is aware of appt date and time

## 2019-09-22 ENCOUNTER — Encounter (HOSPITAL_BASED_OUTPATIENT_CLINIC_OR_DEPARTMENT_OTHER): Payer: Self-pay

## 2019-09-22 ENCOUNTER — Other Ambulatory Visit: Payer: Self-pay

## 2019-09-29 ENCOUNTER — Encounter: Payer: Self-pay | Admitting: *Deleted

## 2019-09-30 ENCOUNTER — Other Ambulatory Visit (INDEPENDENT_AMBULATORY_CARE_PROVIDER_SITE_OTHER): Payer: Medicare Other

## 2019-09-30 DIAGNOSIS — Z0000 Encounter for general adult medical examination without abnormal findings: Secondary | ICD-10-CM | POA: Diagnosis not present

## 2019-09-30 DIAGNOSIS — E119 Type 2 diabetes mellitus without complications: Secondary | ICD-10-CM

## 2019-09-30 LAB — BASIC METABOLIC PANEL
BUN: 16 mg/dL (ref 6–23)
CO2: 25 mEq/L (ref 19–32)
Calcium: 9.9 mg/dL (ref 8.4–10.5)
Chloride: 105 mEq/L (ref 96–112)
Creatinine, Ser: 0.82 mg/dL (ref 0.40–1.20)
GFR: 82.54 mL/min (ref >60.00)
Glucose, Bld: 120 mg/dL — ABNORMAL HIGH (ref 70–99)
Potassium: 3.6 mEq/L (ref 3.5–5.1)
Sodium: 138 mEq/L (ref 135–145)

## 2019-09-30 LAB — CBC WITH DIFFERENTIAL/PLATELET
Basophils Absolute: 0.1 10*3/uL (ref 0.0–0.1)
Basophils Relative: 1.3 % (ref 0.0–3.0)
Eosinophils Absolute: 0.1 10*3/uL (ref 0.0–0.7)
Eosinophils Relative: 2.9 % (ref 0.0–5.0)
HCT: 39.8 % (ref 36.0–46.0)
Hemoglobin: 13.5 g/dL (ref 12.0–15.0)
Lymphocytes Relative: 40.2 % (ref 12.0–46.0)
Lymphs Abs: 1.6 10*3/uL (ref 0.7–4.0)
MCHC: 33.9 g/dL (ref 30.0–36.0)
MCV: 87.4 fl (ref 78.0–100.0)
Monocytes Absolute: 0.3 10*3/uL (ref 0.1–1.0)
Monocytes Relative: 8.2 % (ref 3.0–12.0)
Neutro Abs: 1.9 10*3/uL (ref 1.4–7.7)
Neutrophils Relative %: 47.4 % (ref 43.0–77.0)
Platelets: 208 10*3/uL (ref 150.0–400.0)
RBC: 4.55 Mil/uL (ref 3.87–5.11)
RDW: 13.3 % (ref 11.5–15.5)
WBC: 4 10*3/uL (ref 4.0–10.5)

## 2019-09-30 LAB — HEPATIC FUNCTION PANEL
ALT: 11 U/L (ref 0–35)
AST: 14 U/L (ref 0–37)
Albumin: 4.4 g/dL (ref 3.5–5.2)
Alkaline Phosphatase: 61 U/L (ref 39–117)
Bilirubin, Direct: 0.1 mg/dL (ref 0.0–0.3)
Total Bilirubin: 0.5 mg/dL (ref 0.2–1.2)
Total Protein: 7.2 g/dL (ref 6.0–8.3)

## 2019-09-30 LAB — MICROALBUMIN / CREATININE URINE RATIO
Creatinine,U: 107.4 mg/dL
Microalb Creat Ratio: 0.7 mg/g (ref 0.0–30.0)
Microalb, Ur: <0.7 mg/dL (ref 0.0–1.9)

## 2019-09-30 LAB — URINALYSIS, ROUTINE W REFLEX MICROSCOPIC
Bilirubin Urine: NEGATIVE
Hgb urine dipstick: NEGATIVE
Ketones, ur: NEGATIVE
Leukocytes,Ua: NEGATIVE
Nitrite: NEGATIVE
RBC / HPF: NONE SEEN (ref 0–?)
Specific Gravity, Urine: 1.025 (ref 1.000–1.030)
Total Protein, Urine: NEGATIVE
Urine Glucose: NEGATIVE
Urobilinogen, UA: 1 (ref 0.0–1.0)
WBC, UA: NONE SEEN (ref 0–?)
pH: 5.5 (ref 5.0–8.0)

## 2019-09-30 LAB — LIPID PANEL
Cholesterol: 126 mg/dL (ref 0–200)
HDL: 38.4 mg/dL — ABNORMAL LOW (ref >39.00)
LDL Cholesterol: 69 mg/dL (ref 0–99)
NonHDL: 87.43
Total CHOL/HDL Ratio: 3
Triglycerides: 94 mg/dL (ref 0.0–149.0)
VLDL: 18.8 mg/dL (ref 0.0–40.0)

## 2019-09-30 LAB — HEMOGLOBIN A1C: Hgb A1c MFr Bld: 6.3 % (ref 4.6–6.5)

## 2019-09-30 LAB — TSH: TSH: 1.68 u[IU]/mL (ref 0.35–4.50)

## 2019-10-01 ENCOUNTER — Encounter (HOSPITAL_BASED_OUTPATIENT_CLINIC_OR_DEPARTMENT_OTHER)
Admission: RE | Admit: 2019-10-01 | Discharge: 2019-10-01 | Disposition: A | Discharge status: Home / Self Care | Payer: Medicare Other | Source: Ambulatory Visit | Attending: Surgery | Admitting: Surgery

## 2019-10-01 ENCOUNTER — Other Ambulatory Visit: Payer: Self-pay

## 2019-10-01 ENCOUNTER — Other Ambulatory Visit (HOSPITAL_COMMUNITY)
Admission: RE | Admit: 2019-10-01 | Discharge: 2019-10-01 | Disposition: A | Discharge status: Home / Self Care | Payer: Medicare Other | Source: Ambulatory Visit | Attending: Surgery | Admitting: Surgery

## 2019-10-01 DIAGNOSIS — Z01812 Encounter for preprocedural laboratory examination: Secondary | ICD-10-CM | POA: Diagnosis present

## 2019-10-01 DIAGNOSIS — Z79899 Other long term (current) drug therapy: Secondary | ICD-10-CM | POA: Diagnosis not present

## 2019-10-01 DIAGNOSIS — I10 Essential (primary) hypertension: Secondary | ICD-10-CM | POA: Diagnosis not present

## 2019-10-01 DIAGNOSIS — D241 Benign neoplasm of right breast: Secondary | ICD-10-CM | POA: Diagnosis present

## 2019-10-01 DIAGNOSIS — Z170 Estrogen receptor positive status [ER+]: Secondary | ICD-10-CM | POA: Diagnosis not present

## 2019-10-01 DIAGNOSIS — Z20828 Contact with and (suspected) exposure to other viral communicable diseases: Secondary | ICD-10-CM | POA: Insufficient documentation

## 2019-10-01 DIAGNOSIS — C50912 Malignant neoplasm of unspecified site of left female breast: Secondary | ICD-10-CM | POA: Diagnosis not present

## 2019-10-01 DIAGNOSIS — E785 Hyperlipidemia, unspecified: Secondary | ICD-10-CM | POA: Diagnosis not present

## 2019-10-01 LAB — SARS CORONAVIRUS 2 (TAT 6-24 HRS): SARS Coronavirus 2: NEGATIVE

## 2019-10-01 NOTE — Progress Notes (Signed)
      Enhanced Recovery after Surgery for Orthopedics Enhanced Recovery after Surgery is a protocol used to improve the stress on your body and your recovery after surgery.  Patient Instructions  . The night before surgery:  o No food after midnight. ONLY clear liquids after midnight  . The day of surgery (if you do NOT have diabetes):  o Drink ONE (1) Pre-Surgery Clear Ensure as directed.   o This drink was given to you during your hospital  pre-op appointment visit. o The pre-op nurse will instruct you on the time to drink the  Pre-Surgery Ensure depending on your surgery time. o Finish the drink at the designated time by the pre-op nurse.  o Nothing else to drink after completing the  Pre-Surgery Clear Ensure.  . The day of surgery (if you have diabetes): o Drink ONE (1) Gatorade 2 (G2) as directed. o This drink was given to you during your hospital  pre-op appointment visit.  o The pre-op nurse will instruct you on the time to drink the   Gatorade 2 (G2) depending on your surgery time. o Color of the Gatorade may vary. Red is not allowed. o Nothing else to drink after completing the  Gatorade 2 (G2).         If you have questions, please contact your surgeon's office.  Surgical soap given to patient, instructions given, patient verbalized understanding.   Bmp was drawn yesterday at pcp office.

## 2019-10-04 NOTE — H&P (Signed)
  Connie Marshall  Location: [facility] Patient #: [id number] DOB: 01/26/1946 Widowed / Language: [name] / Race: Black or African American Female   History of Present Illness  The patient is a 73 year old female presenting for a post-operative visit. She is here status post excision of a left axillary mass. This was a large, 6.5 cm mass in the superficial axillary tissue. It was very mobile and easily removed at the time of surgery. There were no deep axillary lymph nodes palpable at the time. The final pathology showed this was an invasive breast cancer. It was difficult to tell whether this was the primary cancer or a completely replaced lymph node. She has since had bilateral breast MRI showing suspicious areas in both breasts. She has been scheduled for a stereotactic MRI guided biopsy of both these areas later this week. Currently, she reports she is doing very well complaints.   Allergies  Adhesive Tape  Allergies Reconciled   Medication History Furosemide (40MG  Tablet, Oral) Active. Fosinopril Sodium (20MG  Tablet, Oral) Active. Simvastatin (20MG  Tablet, Oral) Active. Medications Reconciled  Vitals 08/11/2019 10:58 AM Weight: 240.4 lb Height: 69in Body Surface Area: 2.23 m Body Mass Index: 35.5 kg/m  Temp.: 97.42F  Pulse: 91 (Regular)  BP: 135/80 (Sitting, Left Arm, Standard)       Physical Exam The physical exam findings are as follows: Note:She looks well today. Her left axillary incision is well-healed without evidence of infection. Generally well in appearance Lungs clear CV RRR Abdomen soft, NT   Assessment & Plan (Tadarrius Burch A. 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 Her workup is ongoing. We will go ahead and refer her to both medical and radiation oncology for the  recommendations as well. After her MRI guided biopsies, we will no further on how to proceed with her ongoing workup and potential need for further surgery  Addendum:She is status post excision of a large mass in her left axilla. This ended up being invasive left breast cancer. She ended up undergoing bilateral breast MRIs and bilateral breast biopsies. 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 No other lesions in the breast were identified. Lymph nodes were normal. She was discussed in conference. The recommendations were to proceed with bilateral radioactive seed guided lumpectomies to remove the 2 benign areas as well as reexcision of the left axilla and sentinel lymph node biopsy. Her Oncotype score 0. She was ER and PR positive I had a long discussion with her regarding the diagnosis of the left breast cancer in the axilla which is probably an accessory breast tissue. Again, she was extensively discussed at the multi-disciplinary breast conference and is already seen medical and radiation oncology. The recommendations are to proceed with radioactive seed guided lumpectomies in both breasts to remove the benign areas as well as reexcision of the left axilla and sentinel node biopsy. I discussed all the procedures with her in detail. We discussed the risks as well. She understands and wishes to proceed with surgery which will be scheduled.

## 2019-10-05 ENCOUNTER — Ambulatory Visit (HOSPITAL_BASED_OUTPATIENT_CLINIC_OR_DEPARTMENT_OTHER): Payer: Medicare Other | Admitting: Certified Registered"

## 2019-10-05 ENCOUNTER — Encounter (HOSPITAL_BASED_OUTPATIENT_CLINIC_OR_DEPARTMENT_OTHER): Payer: Self-pay | Admitting: Surgery

## 2019-10-05 ENCOUNTER — Encounter: Payer: Medicare Other | Admitting: Internal Medicine

## 2019-10-05 ENCOUNTER — Encounter (HOSPITAL_COMMUNITY)
Admission: RE | Admit: 2019-10-05 | Discharge: 2019-10-05 | Disposition: A | Discharge status: Home / Self Care | Payer: Medicare Other | Source: Ambulatory Visit | Attending: Surgery | Admitting: Surgery

## 2019-10-05 ENCOUNTER — Encounter (HOSPITAL_BASED_OUTPATIENT_CLINIC_OR_DEPARTMENT_OTHER)
Admission: RE | Disposition: A | Discharge status: Home / Self Care | Payer: Self-pay | Source: Home / Self Care | Attending: Surgery

## 2019-10-05 ENCOUNTER — Ambulatory Visit (HOSPITAL_BASED_OUTPATIENT_CLINIC_OR_DEPARTMENT_OTHER)
Admission: RE | Admit: 2019-10-05 | Discharge: 2019-10-05 | Disposition: A | Discharge status: Home / Self Care | Payer: Medicare Other | Attending: Surgery | Admitting: Surgery

## 2019-10-05 ENCOUNTER — Other Ambulatory Visit: Payer: Self-pay

## 2019-10-05 DIAGNOSIS — E785 Hyperlipidemia, unspecified: Secondary | ICD-10-CM | POA: Insufficient documentation

## 2019-10-05 DIAGNOSIS — D241 Benign neoplasm of right breast: Secondary | ICD-10-CM

## 2019-10-05 DIAGNOSIS — C50912 Malignant neoplasm of unspecified site of left female breast: Secondary | ICD-10-CM | POA: Diagnosis not present

## 2019-10-05 DIAGNOSIS — Z853 Personal history of malignant neoplasm of breast: Secondary | ICD-10-CM

## 2019-10-05 DIAGNOSIS — Z170 Estrogen receptor positive status [ER+]: Secondary | ICD-10-CM | POA: Insufficient documentation

## 2019-10-05 DIAGNOSIS — Z79899 Other long term (current) drug therapy: Secondary | ICD-10-CM | POA: Insufficient documentation

## 2019-10-05 DIAGNOSIS — I10 Essential (primary) hypertension: Secondary | ICD-10-CM | POA: Insufficient documentation

## 2019-10-05 HISTORY — PX: RE-EXCISION OF BREAST LUMPECTOMY: SHX6048

## 2019-10-05 HISTORY — PX: BREAST LUMPECTOMY WITH RADIOACTIVE SEED AND SENTINEL LYMPH NODE BIOPSY: SHX6550

## 2019-10-05 SURGERY — BREAST LUMPECTOMY WITH RADIOACTIVE SEED AND SENTINEL LYMPH NODE BIOPSY
Anesthesia: General | Site: Breast | Laterality: Left

## 2019-10-05 MED ORDER — CEFAZOLIN SODIUM-DEXTROSE 2-3 GM-%(50ML) IV SOLR
INTRAVENOUS | Status: DC | PRN
  Administered 2019-10-05: 2 g via INTRAVENOUS

## 2019-10-05 MED ORDER — SODIUM CHLORIDE (PF) 0.9 % IJ SOLN
INTRAVENOUS | Status: DC | PRN
  Administered 2019-10-05: 5 mL via INTRAMUSCULAR

## 2019-10-05 MED ORDER — DEXTROSE 5 % IV SOLN: 3.0000 g | INTRAVENOUS | Status: DC

## 2019-10-05 MED ORDER — DEXAMETHASONE SODIUM PHOSPHATE 10 MG/ML IJ SOLN
INTRAMUSCULAR | Status: AC
  Filled 2019-10-05: qty 1

## 2019-10-05 MED ORDER — DEXAMETHASONE SODIUM PHOSPHATE 10 MG/ML IJ SOLN
INTRAMUSCULAR | Status: DC | PRN
  Administered 2019-10-05: 5 mg via INTRAVENOUS

## 2019-10-05 MED ORDER — LIDOCAINE 2% (20 MG/ML) 5 ML SYRINGE
INTRAMUSCULAR | Status: AC
  Filled 2019-10-05: qty 5

## 2019-10-05 MED ORDER — ACETAMINOPHEN 500 MG PO TABS
1000.0000 mg | ORAL_TABLET | ORAL | Status: AC
  Administered 2019-10-05: 09:00:00 1000 mg via ORAL

## 2019-10-05 MED ORDER — FENTANYL CITRATE (PF) 100 MCG/2ML IJ SOLN
INTRAMUSCULAR | Status: AC
  Filled 2019-10-05: qty 2

## 2019-10-05 MED ORDER — TRAMADOL HCL 50 MG PO TABS: 50.0000 mg | ORAL_TABLET | Freq: Four times a day (QID) | ORAL | 0 refills | Status: DC | PRN

## 2019-10-05 MED ORDER — CEFAZOLIN SODIUM-DEXTROSE 2-4 GM/100ML-% IV SOLN
INTRAVENOUS | Status: AC
  Filled 2019-10-05: qty 100

## 2019-10-05 MED ORDER — ONDANSETRON HCL 4 MG/2ML IJ SOLN
INTRAMUSCULAR | Status: DC | PRN
  Administered 2019-10-05 (×2): 4 mg via INTRAVENOUS

## 2019-10-05 MED ORDER — BUPIVACAINE HCL (PF) 0.5 % IJ SOLN
INTRAMUSCULAR | Status: DC | PRN
  Administered 2019-10-05: 25 mL

## 2019-10-05 MED ORDER — GABAPENTIN 300 MG PO CAPS
300.0000 mg | ORAL_CAPSULE | ORAL | Status: AC
  Administered 2019-10-05: 300 mg via ORAL

## 2019-10-05 MED ORDER — ONDANSETRON HCL 4 MG/2ML IJ SOLN
INTRAMUSCULAR | Status: AC
  Filled 2019-10-05: qty 4

## 2019-10-05 MED ORDER — CHLORHEXIDINE GLUCONATE CLOTH 2 % EX PADS: 6.0000 | MEDICATED_PAD | Freq: Once | CUTANEOUS | Status: DC

## 2019-10-05 MED ORDER — PROPOFOL 10 MG/ML IV BOLUS
INTRAVENOUS | Status: AC
  Filled 2019-10-05: qty 20

## 2019-10-05 MED ORDER — HYDROMORPHONE HCL 1 MG/ML IJ SOLN: 0.2500 mg | INTRAMUSCULAR | Status: DC | PRN

## 2019-10-05 MED ORDER — MIDAZOLAM HCL 2 MG/2ML IJ SOLN
INTRAMUSCULAR | Status: AC
  Filled 2019-10-05: qty 2

## 2019-10-05 MED ORDER — PROPOFOL 500 MG/50ML IV EMUL
INTRAVENOUS | Status: DC | PRN
  Administered 2019-10-05: 25 ug/kg/min via INTRAVENOUS
  Administered 2019-10-05: 150 ug/kg/min via INTRAVENOUS

## 2019-10-05 MED ORDER — ACETAMINOPHEN 500 MG PO TABS
ORAL_TABLET | ORAL | Status: AC
  Filled 2019-10-05: qty 2

## 2019-10-05 MED ORDER — MIDAZOLAM HCL 2 MG/2ML IJ SOLN
1.0000 mg | INTRAMUSCULAR | Status: DC | PRN
  Administered 2019-10-05: 2 mg via INTRAVENOUS

## 2019-10-05 MED ORDER — SCOPOLAMINE 1 MG/3DAYS TD PT72
MEDICATED_PATCH | TRANSDERMAL | Status: AC
  Filled 2019-10-05: qty 1

## 2019-10-05 MED ORDER — LACTATED RINGERS IV SOLN: INTRAVENOUS | Status: DC

## 2019-10-05 MED ORDER — BUPIVACAINE-EPINEPHRINE (PF) 0.5% -1:200000 IJ SOLN
INTRAMUSCULAR | Status: DC | PRN
  Administered 2019-10-05: 30 mL

## 2019-10-05 MED ORDER — TECHNETIUM TC 99M SULFUR COLLOID FILTERED
1.0000 | Freq: Once | INTRAVENOUS | Status: AC | PRN
  Administered 2019-10-05: 11:00:00 1 via INTRADERMAL

## 2019-10-05 MED ORDER — ONDANSETRON HCL 4 MG/2ML IJ SOLN: 4.0000 mg | Freq: Once | INTRAMUSCULAR | Status: DC | PRN

## 2019-10-05 MED ORDER — FENTANYL CITRATE (PF) 100 MCG/2ML IJ SOLN
50.0000 ug | INTRAMUSCULAR | Status: AC | PRN
  Administered 2019-10-05: 50 ug via INTRAVENOUS
  Administered 2019-10-05: 25 ug via INTRAVENOUS
  Administered 2019-10-05: 100 ug via INTRAVENOUS

## 2019-10-05 MED ORDER — SCOPOLAMINE 1 MG/3DAYS TD PT72
MEDICATED_PATCH | TRANSDERMAL | Status: DC | PRN
  Administered 2019-10-05: 1 via TRANSDERMAL

## 2019-10-05 MED ORDER — LIDOCAINE 2% (20 MG/ML) 5 ML SYRINGE
INTRAMUSCULAR | Status: DC | PRN
  Administered 2019-10-05: 60 mg via INTRAVENOUS

## 2019-10-05 MED ORDER — MEPERIDINE HCL 25 MG/ML IJ SOLN: 6.2500 mg | INTRAMUSCULAR | Status: DC | PRN

## 2019-10-05 MED ORDER — GABAPENTIN 300 MG PO CAPS
ORAL_CAPSULE | ORAL | Status: AC
  Filled 2019-10-05: qty 1

## 2019-10-05 SURGICAL SUPPLY — 51 items
ADH SKN CLS APL DERMABOND .7 (GAUZE/BANDAGES/DRESSINGS) ×2
APL PRP STRL LF DISP 70% ISPRP (MISCELLANEOUS) ×4
APPLIER CLIP 9.375 MED OPEN (MISCELLANEOUS) ×4
APR CLP MED 9.3 20 MLT OPN (MISCELLANEOUS) ×2
BLADE HEX COATED 2.75 (ELECTRODE) ×4 IMPLANT
BLADE SURG 15 STRL LF DISP TIS (BLADE) ×2 IMPLANT
BLADE SURG 15 STRL SS (BLADE) ×4
CANISTER SUCT 1200ML W/VALVE (MISCELLANEOUS) ×4 IMPLANT
CHLORAPREP W/TINT 26 (MISCELLANEOUS) ×6 IMPLANT
CLIP APPLIE 9.375 MED OPEN (MISCELLANEOUS) ×2 IMPLANT
COVER BACK TABLE REUSABLE LG (DRAPES) ×4 IMPLANT
COVER MAYO STAND REUSABLE (DRAPES) ×4 IMPLANT
COVER PROBE W GEL 5X96 (DRAPES) ×4 IMPLANT
DERMABOND ADVANCED (GAUZE/BANDAGES/DRESSINGS) ×2
DERMABOND ADVANCED .7 DNX12 (GAUZE/BANDAGES/DRESSINGS) ×2 IMPLANT
DRAPE LAPAROSCOPIC ABDOMINAL (DRAPES) ×4 IMPLANT
DRAPE UTILITY XL STRL (DRAPES) ×4 IMPLANT
ELECT REM PT RETURN 9FT ADLT (ELECTROSURGICAL) ×4
ELECTRODE REM PT RTRN 9FT ADLT (ELECTROSURGICAL) ×2 IMPLANT
GAUZE SPONGE 4X4 12PLY STRL (GAUZE/BANDAGES/DRESSINGS) ×4 IMPLANT
GAUZE SPONGE 4X4 12PLY STRL LF (GAUZE/BANDAGES/DRESSINGS) IMPLANT
GLOVE BIO SURGEON STRL SZ 6.5 (GLOVE) ×1 IMPLANT
GLOVE BIO SURGEON STRL SZ7 (GLOVE) ×4 IMPLANT
GLOVE BIO SURGEONS STRL SZ 6.5 (GLOVE) ×1
GLOVE BIOGEL PI IND STRL 6.5 (GLOVE) IMPLANT
GLOVE BIOGEL PI INDICATOR 6.5 (GLOVE) ×2
GLOVE SURG SIGNA 7.5 PF LTX (GLOVE) ×6 IMPLANT
GOWN STRL REUS W/ TWL LRG LVL3 (GOWN DISPOSABLE) ×2 IMPLANT
GOWN STRL REUS W/ TWL XL LVL3 (GOWN DISPOSABLE) ×2 IMPLANT
GOWN STRL REUS W/TWL LRG LVL3 (GOWN DISPOSABLE) ×4
GOWN STRL REUS W/TWL XL LVL3 (GOWN DISPOSABLE) ×4
KIT MARKER MARGIN INK (KITS) ×4 IMPLANT
NDL HYPO 25X1 1.5 SAFETY (NEEDLE) ×2 IMPLANT
NDL SAFETY ECLIPSE 18X1.5 (NEEDLE) ×2 IMPLANT
NEEDLE HYPO 18GX1.5 SHARP (NEEDLE) ×4
NEEDLE HYPO 25X1 1.5 SAFETY (NEEDLE) ×4 IMPLANT
NS IRRIG 1000ML POUR BTL (IV SOLUTION) ×4 IMPLANT
PACK BASIN DAY SURGERY FS (CUSTOM PROCEDURE TRAY) ×4 IMPLANT
PENCIL SMOKE EVACUATOR (MISCELLANEOUS) ×4 IMPLANT
SLEEVE SCD COMPRESS KNEE MED (MISCELLANEOUS) ×4 IMPLANT
SPONGE LAP 4X18 RFD (DISPOSABLE) ×10 IMPLANT
SUT MNCRL AB 4-0 PS2 18 (SUTURE) ×8 IMPLANT
SUT SILK 2 0 SH (SUTURE) ×4 IMPLANT
SUT VIC AB 3-0 SH 27 (SUTURE) ×12
SUT VIC AB 3-0 SH 27X BRD (SUTURE) ×2 IMPLANT
SYR CONTROL 10ML LL (SYRINGE) ×6 IMPLANT
TOWEL GREEN STERILE FF (TOWEL DISPOSABLE) ×4 IMPLANT
TRAY FAXITRON CT DISP (TRAY / TRAY PROCEDURE) ×4 IMPLANT
TUBE CONNECTING 20'X1/4 (TUBING) ×1
TUBE CONNECTING 20X1/4 (TUBING) ×1 IMPLANT
YANKAUER SUCT BULB TIP NO VENT (SUCTIONS) ×4 IMPLANT

## 2019-10-05 NOTE — Progress Notes (Signed)
Assisted Dr. Ossey with left, ultrasound guided, pectoralis block. Side rails up, monitors on throughout procedure. See vital signs in flow sheet. Tolerated Procedure well. 

## 2019-10-05 NOTE — Progress Notes (Signed)
Nuc med injections completed. Patient tolerated well.   

## 2019-10-05 NOTE — Anesthesia Preprocedure Evaluation (Signed)
Anesthesia Evaluation  Patient identified by MRN, date of birth, ID band Patient awake    Reviewed: Allergy & Precautions, NPO status , Patient's Chart, lab work & pertinent test results  History of Anesthesia Complications (+) PONV  Airway Mallampati: I  TM Distance: >3 FB Neck ROM: Full    Dental   Pulmonary    Pulmonary exam normal        Cardiovascular hypertension, Pt. on medications Normal cardiovascular exam     Neuro/Psych Anxiety    GI/Hepatic   Endo/Other    Renal/GU      Musculoskeletal   Abdominal   Peds  Hematology   Anesthesia Other Findings   Reproductive/Obstetrics                             Anesthesia Physical Anesthesia Plan  ASA: II  Anesthesia Plan: General   Post-op Pain Management:  Regional for Post-op pain   Induction:   PONV Risk Score and Plan: 4 or greater and Scopolamine patch - Pre-op, Midazolam and Ondansetron  Airway Management Planned: LMA  Additional Equipment:   Intra-op Plan:   Post-operative Plan: Extubation in OR  Informed Consent: I have reviewed the patients History and Physical, chart, labs and discussed the procedure including the risks, benefits and alternatives for the proposed anesthesia with the patient or authorized representative who has indicated his/her understanding and acceptance.       Plan Discussed with: CRNA and Surgeon  Anesthesia Plan Comments:         Anesthesia Quick Evaluation

## 2019-10-05 NOTE — Anesthesia Procedure Notes (Signed)
Anesthesia Regional Block: Pectoralis block   Pre-Anesthetic Checklist: ,, timeout performed, Correct Patient, Correct Site, Correct Laterality, Correct Procedure, Correct Position, site marked, Risks and benefits discussed,  Surgical consent,  Pre-op evaluation,  At surgeon's request and post-op pain management  Laterality: Left  Prep: chloraprep       Needles:  Injection technique: Single-shot  Needle Type: Stimulator Needle - 80     Needle Length: 9cm  Needle Gauge: 21     Additional Needles:   Procedures:,,,, ultrasound used (permanent image in chart),,,,  Narrative:  Start time: 10/05/2019 10:52 AM End time: 10/05/2019 11:02 AM Injection made incrementally with aspirations every 5 mL.  Performed by: Personally  Anesthesiologist: Lillia Abed, MD

## 2019-10-05 NOTE — Discharge Instructions (Signed)
[place] Office Phone Number [phone]  BREAST BIOPSY/ PARTIAL MASTECTOMY: POST OP INSTRUCTIONS  Always review your discharge instruction sheet given to you by the facility where your surgery was performed.  IF YOU HAVE DISABILITY OR FAMILY LEAVE FORMS, YOU MUST BRING THEM TO THE OFFICE FOR PROCESSING.  DO NOT GIVE THEM TO YOUR DOCTOR.  1. A prescription for pain medication may be given to you upon discharge.  Take your pain medication as prescribed, if needed.  If narcotic pain medicine is not needed, then you may take acetaminophen (Tylenol) or ibuprofen (Advil) as needed. 2. Take your usually prescribed medications unless otherwise directed 3. If you need a refill on your pain medication, please contact your pharmacy.  They will contact our office to request authorization.  Prescriptions will not be filled after 5pm or on week-ends. 4. You should eat very light the first 24 hours after surgery, such as soup, crackers, pudding, etc.  Resume your normal diet the day after surgery. 5. Most patients will experience some swelling and bruising in the breast.  Ice packs and a good support bra will help.  Swelling and bruising can take several days to resolve.  6. It is common to experience some constipation if taking pain medication after surgery.  Increasing fluid intake and taking a stool softener will usually help or prevent this problem from occurring.  A mild laxative (Milk of Magnesia or Miralax) should be taken according to package directions if there are no bowel movements after 48 hours. 7. Unless discharge instructions indicate otherwise, you may remove your bandages 24-48 hours after surgery, and you may shower at that time.  You may have steri-strips (small skin tapes) in place directly over the incision.  These strips should be left on the skin for 7-10 days.  If your surgeon used skin glue on the incision, you may shower in 24 hours.  The glue will flake off over the  next 2-3 weeks.  Any sutures or staples will be removed at the office during your follow-up visit. 8. ACTIVITIES:  You may resume regular daily activities (gradually increasing) beginning the next day.  Wearing a good support bra or sports bra minimizes pain and swelling.  You may have sexual intercourse when it is comfortable. a. You may drive when you no longer are taking prescription pain medication, you can comfortably wear a seatbelt, and you can safely maneuver your car and apply brakes. b. RETURN TO WORK:  ______________________________________________________________________________________ 9. You should see your doctor in the office for a follow-up appointment approximately two weeks after your surgery.  Your doctors nurse will typically make your follow-up appointment when she calls you with your pathology report.  Expect your pathology report 2-3 business days after your surgery.  You may call to check if you do not hear from [place] after three days. 10. OTHER INSTRUCTIONS: _______________________________________________________________________________________________ _____________________________________________________________________________________________________________________________________ _____________________________________________________________________________________________________________________________________ _____________________________________________________________________________________________________________________________________  WHEN TO CALL YOUR DOCTOR: 1. Fever over 101.0 2. Nausea and/or vomiting. 3. Extreme swelling or bruising. 4. Continued bleeding from incision. 5. Increased pain, redness, or drainage from the incision.  The clinic staff is available to answer your questions during regular business hours.  Please dont hesitate to call and ask to speak to one of the nurses for clinical concerns.  If you have a medical emergency, go to the nearest  emergency room or call 911.  A surgeon from [facility] is always on call at the hospital.  For further questions, please visit [email or web address]  No Tylenol until 3:30 PM on 10/05/2019.   Post Anesthesia Home Care Instructions  Activity: Get plenty of rest for the remainder of the day. A responsible individual must stay with you for 24 hours following the procedure.  For the next 24 hours, DO NOT: -Drive a car -Paediatric nurse -Drink alcoholic beverages -Take any medication unless instructed by your physician -Make any legal decisions or sign important papers.  Meals: Start with liquid foods such as gelatin or soup. Progress to regular foods as tolerated. Avoid greasy, spicy, heavy foods. If nausea and/or vomiting occur, drink only clear liquids until the nausea and/or vomiting subsides. Call your physician if vomiting continues.  Special Instructions/Symptoms: Your throat may feel dry or sore from the anesthesia or the breathing tube placed in your throat during surgery. If this causes discomfort, gargle with warm salt water. The discomfort should disappear within 24 hours.  If you had a scopolamine patch placed behind your ear for the management of post- operative nausea and/or vomiting:  1. The medication in the patch is effective for 72 hours, after which it should be removed.  Wrap patch in a tissue and discard in the trash. Wash hands thoroughly with soap and water. 2. You may remove the patch earlier than 72 hours if you experience unpleasant side effects which may include dry mouth, dizziness or visual disturbances. 3. Avoid touching the patch. Wash your hands with soap and water after contact with the patch.

## 2019-10-05 NOTE — Anesthesia Procedure Notes (Signed)
Procedure Name: LMA Insertion Date/Time: 10/05/2019 10:45 AM Performed by: Gwyndolyn Saxon, CRNA Pre-anesthesia Checklist: Patient identified, Emergency Drugs available, Suction available and Patient being monitored Patient Re-evaluated:Patient Re-evaluated prior to induction Oxygen Delivery Method: Circle system utilized Preoxygenation: Pre-oxygenation with 100% oxygen Induction Type: IV induction Ventilation: Mask ventilation without difficulty LMA: LMA inserted LMA Size: 4.0 Number of attempts: 1 Placement Confirmation: positive ETCO2 and breath sounds checked- equal and bilateral Tube secured with: Tape Dental Injury: Teeth and Oropharynx as per pre-operative assessment

## 2019-10-05 NOTE — Op Note (Addendum)
BILATERAL BREAST LUMPECTOMY WITH BILATERAL RADIOACTIVE SEEDS AND LEFT DEEP AXILLARYSENTINEL LYMPH NODE BIOPSY, RE-EXCISION OF LEFT BREAST CANCER , INJECTION OF BLUE DYE Procedure Note  Connie Marshall 10/05/2019   Pre-op Diagnosis: RIGHT BREAST PAPILLOMA, left breast mass, LEFT BREAST CANCER     Post-op Diagnosis: same  Procedure(s): BILATERAL BREAST LUMPECTOMY WITH BILATERAL RADIOACTIVE SEEDS AND LEFT DEEP AXILLARY  SENTINEL LYMPH NODE BIOPSY RE-EXCISION OF LEFT BREAST CANCER INJECTION OF BLUE DYE  Surgeon(s): Coralie Keens, MD  Anesthesia: Monitor Anesthesia Care  Staff:  Circulator: Rozell Searing, RN; Izora Ribas, RN Scrub Person: Rolan Bucco  Estimated Blood Loss: Minimal               Specimens: Bilateral lumpectomies, reexcision of left axillary breast cancer, and left axillary sentinel lymph nodes sent to pathology  Indications: This is a 73 year old female who was found to have a superficial mass approximately 6 cm in size in her left axilla.  The mass was excised and found to be a breast cancer on final pathology.  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  Radioactive seed guided lumpectomy of both these areas was recommended along with a left axillary sentinel lymph node biopsy and reexcision of the area in the axilla where the cancer has been present.  Procedure:  The patient was brought to the operating room identifies correct patient.  She is placed upon the operating room table and general anesthesia was induced.  Her breast and axilla were then prepped and draped in usual sterile fashion.  Identified the radioactive seed in the right breast with the neoprobe.  I anesthetized the upper edge of the areola with Marcaine.  I made incision with a scalpel.  I then dissected into the breast tissue and performed a lumpectomy with the aid of the  neoprobe staying around the radioactive seed.  The lumpectomy specimen was then completely removed.  Its margins were marked with paint.  An x-ray was then performed confirming that the radioactive seed and previous tissue marker were in the specimen.  This was sent to pathology for evaluation.  I then closed the subcutaneous tissue with interrupted 3-0 Vicryl sutures and closed the skin with running 4-0 Monocryl after hemostasis was achieved.  I next turned my attention toward the left breast.  I again identified the radioactive seed with the neoprobe.  I anesthetized the skin at the upper edge of the areola with Marcaine.  I made incision with a scalpel and dissected into the breast tissue.  Her breast tissue was very soft and pulled apart easily.  The radioactive seed fell out of the area of dissection.  It had to be placed in a cup and x-rayed separately.  I completed a wide lumpectomy in the area of concern.  I painted the lumpectomy specimen with paint to marked the margins.  An x-ray was performed and no clip was identified.  I removed 2 other large pieces of breast tissue in the area and again no clip was seen.  The surrounding tissue appeared normal.  At this point I removed some of the tissue I decided to forego any further attempt of lumpectomy and finding the previous clip.  I achieved hemostasis with the cautery.  I then closed the subcutaneous tissue with interrupted 3-0 Vicryl sutures and closed the skin with a running 4-0 Monocryl.  I then anesthetized the skin of  the previous scar in the axilla with Marcaine.  I made incision with a scalpel and then widely excised the area of the previous resection with the cautery.  I marked this with paint and sent to pathology as well as reexcision of where the breast cancer abdomen.  I injected blue dye around the areola of the left breast at the very beginning of the case.  I was able to identify several sentinel lymph nodes with the aid of the blue dye and  the neoprobe in the deep axilla.  The lymph nodes were sent to pathology for evaluation.  I achieved hemostasis with the cautery, surgical clips, and a silk suture.  I then closed subcutaneous tissue with interrupted 3-0 Vicryl sutures and closed skin with a running 4-0 Monocryl.  Dermabond was then applied to all incisions.  The patient tolerated procedure well.  All the counts were correct at the end the procedure.  The patient was then extubated in the operating room and taken in stable condition to the recovery.          Coralie Keens   Date: 10/05/2019  Time: 12:12 PM

## 2019-10-05 NOTE — Anesthesia Postprocedure Evaluation (Signed)
Anesthesia Post Note  Patient: Connie Marshall  Procedure(s) Performed: BILATERAL BREAST LUMPECTOMY WITH BILATERAL RADIOACTIVE SEEDS AND LEFT SENTINEL LYMPH NODE BIOPSY (Bilateral Breast) RE-EXCISION OF LEFT BREAST CANCER (Left Breast)     Patient location during evaluation: PACU Anesthesia Type: General Level of consciousness: awake and alert Pain management: pain level controlled Vital Signs Assessment: post-procedure vital signs reviewed and stable Respiratory status: spontaneous breathing, nonlabored ventilation, respiratory function stable and patient connected to nasal cannula oxygen Cardiovascular status: blood pressure returned to baseline and stable Postop Assessment: no apparent nausea or vomiting Anesthetic complications: no    Last Vitals:  Vitals:   10/05/19 1355 10/05/19 1441  BP: 130/79 131/74  Pulse: 60 61  Resp: 18 18  Temp: 36.4 C 36.7 C  SpO2: 100% 100%    Last Pain:  Vitals:   10/05/19 1441  TempSrc:   PainSc: 0-No pain                 Coley Littles DAVID

## 2019-10-05 NOTE — Interval H&P Note (Signed)
History and Physical Interval Note: no change in H and P  10/05/2019 9:40 AM  Connie Marshall  has presented today for surgery, with the diagnosis of RIGHT BREAST PAPILLOMA, LEFT BREAST CANCER.  The various methods of treatment have been discussed with the patient and family. After consideration of risks, benefits and other options for treatment, the patient has consented to  Procedure(s): BILATERAL BREAST LUMPECTOMY WITH BILATERAL RADIOACTIVE SEEDS AND LEFT SENTINEL LYMPH NODE BIOPSY (Bilateral) RE-EXCISION OF LEFT BREAST CANCER (Left) as a surgical intervention.  The patient's history has been reviewed, patient examined, no change in status, stable for surgery.  I have reviewed the patient's chart and labs.  Questions were answered to the patient's satisfaction.     Coralie Keens

## 2019-10-05 NOTE — Transfer of Care (Signed)
Immediate Anesthesia Transfer of Care Note  Patient: Tylie Cadwalader  Procedure(s) Performed: BILATERAL BREAST LUMPECTOMY WITH BILATERAL RADIOACTIVE SEEDS AND LEFT SENTINEL LYMPH NODE BIOPSY (Bilateral Breast) RE-EXCISION OF LEFT BREAST CANCER (Left Breast)  Patient Location: PACU  Anesthesia Type:General and Regional  Level of Consciousness: drowsy  Airway & Oxygen Therapy: Patient Spontanous Breathing and Patient connected to face mask oxygen  Post-op Assessment: Report given to RN and Post -op Vital signs reviewed and stable  Post vital signs: Reviewed and stable  Last Vitals:  Vitals Value Taken Time  BP    Temp    Pulse    Resp    SpO2      Last Pain:  Vitals:   10/05/19 0913  TempSrc: Oral  PainSc: 0-No pain      Patients Stated Pain Goal: 3 ([id number])  Complications: No apparent anesthesia complications

## 2019-10-06 ENCOUNTER — Encounter: Payer: Self-pay | Admitting: *Deleted

## 2019-10-12 LAB — SURGICAL PATHOLOGY

## 2019-10-13 ENCOUNTER — Ambulatory Visit: Payer: Medicare Other

## 2019-10-13 NOTE — Progress Notes (Signed)
Patient Care Team: [name], MD as PCP - General (Internal Medicine) Kathie Rhodes, MD (Urology) Dyke Maes, [place] (Optometry) Lafayette Dragon, MD (Inactive) (Gastroenterology) Thornell Sartorius, MD (Otolaryngology)  DIAGNOSIS:    ICD-10-CM   1. 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 Left breast reexcision: IDC grade 2, 1.7 cm, intermediate grade DCIS, margins clear, 0/6 lymph nodes negative, ER 95%, PR 95%, HER-2 negative, Ki-67 15%, T3N0   10/14/2019 Cancer Staging   Staging form: Breast, AJCC 8th Edition - Pathologic stage from 10/14/2019: Stage IB (pT3, pN0(sn), cM0, G2, ER+, PR+, HER2-) - Signed by Nicholas Lose, MD on 10/14/2019   Carcinoma of upper-outer quadrant of left breast in female, estrogen receptor positive ([place])  08/25/2019 Initial Diagnosis   Carcinoma of upper-outer quadrant of left breast in female, estrogen receptor positive ([place])   08/25/2019 Cancer Staging   Staging form: Breast, AJCC 8th Edition - Clinical: Stage IIA (cT3, cN0, cM0, G2, ER+, PR+, HER2-) - Signed by Eppie Gibson, MD on 08/25/2019   09/02/2019 Oncotype testing    Oncotype: score of 0 with a 3% chance of distant recurrence in 9 years with antiestrogen therapy alone.    10/05/2019 Surgery   Bilateral lumpectomies Ninfa Linden):  Right breast: intraductal papilloma and no evidence of malignancy  Left breast: IDC with DCIS, grade 2, 1.7cm, clear margins, 6 left axillary lymph nodes negative      CHIEF COMPLIANT: Follow-up s/p bilateral lumpectomies to review pathology  INTERVAL HISTORY: Connie Marshall is a 73 y.o. with above-mentioned history of left breast cancer. Oncotype testing showed a score of 0 with a 3% chance of distant recurrence in 9 years with antiestrogen therapy alone. She underwent bilateral lumpectomies on 10/05/19 with Dr. 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 She presents to the clinic today to review the pathology report and discuss further treatment.   REVIEW OF SYSTEMS:   Constitutional: Denies fevers, chills or abnormal weight loss Eyes: Denies blurriness of vision Ears, nose, mouth, throat, and face: Denies mucositis or sore throat Respiratory: Denies cough, dyspnea or wheezes Cardiovascular: Denies palpitation, chest discomfort Gastrointestinal: Denies nausea, heartburn or change in bowel habits Skin: Denies abnormal skin rashes Lymphatics: Denies new lymphadenopathy or easy bruising Neurological: Denies numbness, tingling or new weaknesses Behavioral/Psych: Mood is stable, no new changes  Extremities: No lower extremity edema Breast: denies any pain or lumps or nodules in either breasts All other systems were reviewed with the patient and are negative.  I have reviewed  the past medical history, past surgical history, social history and family history with the patient and they are unchanged from previous note.   ALLERGIES:  is allergic to tape.  MEDICATIONS:  Current Outpatient Medications  Medication Sig Dispense Refill  . fosinopril (MONOPRIL) 20 MG tablet Take 1 tablet (20 mg total) by mouth daily. 90 tablet 3  . furosemide (LASIX) 40 MG tablet 1 tab by mouth in the AM, and 1 in the PM as needed for leg swelling 180 tablet 3  . halobetasol (ULTRAVATE) 0.05 % ointment     . 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 simvastatin (ZOCOR) 20 MG tablet TAKE 1 TABLET BY MOUTH EVERY EVENING 30 tablet 11  . traMADol (ULTRAM) 50 MG tablet Take 1 tablet (50 mg total) by mouth every 6 (six) hours as needed. 30 tablet 0  . VIT B12-METHIONINE-INOS-CHOL IM Inject into the muscle every 30 (thirty) days.    . Vitamin D, Ergocalciferol, (DRISDOL) 1.25 MG (50000 UT) CAPS capsule Take 1 capsule (50,000 Units total) by mouth every 7 (seven) days. 12 capsule 0   Current Facility-Administered Medications  Medication Dose Route Frequency Provider Last Rate Last Admin  . denosumab (PROLIA) injection 60 mg  60 mg Subcutaneous Once [name], MD        PHYSICAL EXAMINATION: ECOG PERFORMANCE STATUS: 1 - Symptomatic but completely ambulatory  There were no vitals filed for this visit. There were no vitals filed for this visit.  GENERAL: alert, no distress and comfortable SKIN: skin color, texture, turgor are normal, no rashes or significant lesions EYES: normal, Conjunctiva are pink and non-injected, sclera clear OROPHARYNX: no exudate, no erythema and lips, buccal mucosa, and tongue normal  NECK: supple, thyroid normal size, non-tender, without nodularity LYMPH: no palpable lymphadenopathy in the cervical, axillary or inguinal LUNGS: clear to auscultation and percussion with normal breathing effort HEART: regular rate & rhythm and no murmurs and no lower extremity edema ABDOMEN: abdomen soft, non-tender and normal bowel sounds MUSCULOSKELETAL: no cyanosis of digits and no clubbing  NEURO:  alert & oriented x 3 with fluent speech, no focal motor/sensory deficits EXTREMITIES: No lower extremity edema  LABORATORY DATA:  I have reviewed the data as listed CMP Latest Ref Rng & Units 09/30/2019 08/05/2019 07/08/2019  Glucose 70 - 99 mg/dL 120(H) 149(H) 145(H)  BUN 6 - 23 mg/dL '16 10 10  ' Creatinine 0.40 - 1.20 mg/dL 0.82 0.80 0.88  Sodium 135 - 145 mEq/L 138 139 139  Potassium 3.5 - 5.1 mEq/L 3.6 4.1 3.9  Chloride 96 - 112 mEq/L 105 104 105  CO2 19 - 32 mEq/L '25 25 24  ' Calcium 8.4 - 10.5 mg/dL 9.9 10.2 9.8  Total Protein 6.0 - 8.3 g/dL 7.2 7.3 -  Total Bilirubin 0.2 - 1.2 mg/dL 0.5 0.5 -  Alkaline Phos 39 - 117 U/L 61 62 -  AST 0 - 37 U/L 14 13 -  ALT 0 - 35 U/L 11 9 -    Lab Results  Component Value Date   WBC 4.0 09/30/2019   HGB 13.5 09/30/2019   HCT 39.8 09/30/2019   MCV 87.4 09/30/2019   PLT 208.0 09/30/2019   NEUTROABS 1.9 09/30/2019    ASSESSMENT & PLAN:  Bilateral breast cancer ([facility]) 07/12/2019:Left axillary soft tissue excision: 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 Left breast reexcision: IDC grade 2, 1.7 cm, intermediate grade DCIS, margins clear, 0/6 lymph nodes negative, ER 95%, PR 95%, HER-2 negative, Ki-67 15%, T3N0 Stage Ib pathologic stage  Pathology counseling: I discussed the final pathology report of the patient provided  a copy of this report. I discussed the margins as well as lymph node surgeries. We also discussed the final staging along with previously performed ER/PR and HER-2/neu testing.  Treatment plan: 1. Oncotype DX to determine if she would benefit from chemotherapy 2.  Adjuvant radiation therapy 3.  Follow-up adjuvant antiestrogen therapy  Left  axillary seroma: Patient might need drainage on Monday she is seeing Dr. Ninfa Linden. Return to clinic based upon Oncotype DX test result   No orders of the defined types were placed in this encounter.  The patient has a good understanding of the overall plan. she agrees with it. she will call with any problems that may develop before the next visit here.  Nicholas Lose, MD 10/14/2019  Julious Oka Dorshimer, am acting as scribe for Dr. Nicholas Lose.  I have reviewed the above document for accuracy and completeness, and I agree with the above.

## 2019-10-14 ENCOUNTER — Inpatient Hospital Stay: Payer: Medicare Other | Attending: Hematology and Oncology | Admitting: Hematology and Oncology

## 2019-10-14 ENCOUNTER — Other Ambulatory Visit: Payer: Self-pay

## 2019-10-14 DIAGNOSIS — C50911 Malignant neoplasm of unspecified site of right female breast: Secondary | ICD-10-CM | POA: Diagnosis not present

## 2019-10-14 DIAGNOSIS — C50912 Malignant neoplasm of unspecified site of left female breast: Secondary | ICD-10-CM

## 2019-10-14 DIAGNOSIS — Z170 Estrogen receptor positive status [ER+]: Secondary | ICD-10-CM | POA: Insufficient documentation

## 2019-10-14 DIAGNOSIS — D241 Benign neoplasm of right breast: Secondary | ICD-10-CM | POA: Diagnosis not present

## 2019-10-14 DIAGNOSIS — C50412 Malignant neoplasm of upper-outer quadrant of left female breast: Secondary | ICD-10-CM | POA: Diagnosis present

## 2019-10-14 NOTE — Assessment & Plan Note (Addendum)
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 Left breast reexcision: IDC grade 2, 1.7 cm, intermediate grade DCIS, margins clear, 0/6 lymph nodes negative, ER 95%, PR 95%, HER-2 negative, Ki-67 15%, T3N0 Stage Ib pathologic stage  Pathology counseling: I discussed the final pathology report of the patient provided  a copy of this report. I discussed the margins as well as lymph node surgeries. We also discussed the final staging along with previously performed ER/PR and HER-2/neu testing.  Treatment plan: 1. Oncotype DX to determine if she would benefit from chemotherapy 2.  Adjuvant radiation therapy 3.  Follow-up adjuvant antiestrogen therapy  Return to clinic based upon Oncotype DX test result

## 2019-10-18 ENCOUNTER — Telehealth: Payer: Self-pay | Admitting: *Deleted

## 2019-10-18 ENCOUNTER — Encounter: Payer: Self-pay | Admitting: Internal Medicine

## 2019-10-18 ENCOUNTER — Ambulatory Visit: Payer: Medicare Other

## 2019-10-18 NOTE — Telephone Encounter (Signed)
Left vm for pt to return call regarding next step is xrt. Oncotype score is 0. Does not need chemo. Contact information provided.

## 2019-10-18 NOTE — Telephone Encounter (Signed)
Called pt and discussed oncotype score of 0 and that she does not need chemo. Received verbal understanding. Verified appt with Dr. Isidore Moos. Denies further needs or questions at this time.

## 2019-10-19 MED ORDER — SIMVASTATIN 20 MG PO TABS: 20.0000 mg | ORAL_TABLET | Freq: Every evening | ORAL | 1 refills | Status: DC

## 2019-10-19 NOTE — Telephone Encounter (Signed)
Ok to print off the letter per pt request as is ready for pickup

## 2019-10-20 ENCOUNTER — Ambulatory Visit (INDEPENDENT_AMBULATORY_CARE_PROVIDER_SITE_OTHER): Payer: Medicare Other

## 2019-10-20 ENCOUNTER — Other Ambulatory Visit: Payer: Self-pay

## 2019-10-20 DIAGNOSIS — E538 Deficiency of other specified B group vitamins: Secondary | ICD-10-CM

## 2019-10-20 MED ORDER — CYANOCOBALAMIN 1000 MCG/ML IJ SOLN
1000.0000 ug | Freq: Once | INTRAMUSCULAR | Status: AC
  Administered 2019-10-20: 1000 ug via INTRAMUSCULAR

## 2019-10-20 NOTE — Progress Notes (Signed)
Medical screening examination/treatment/procedure(s) were performed by non-physician practitioner and as supervising physician I was immediately available for consultation/collaboration. I agree with above. James John, MD   

## 2019-10-22 NOTE — Progress Notes (Signed)
Location of Breast Cancer: Left Axillary  Histology per Pathology Report:  07/12/19 FINAL MICROSCOPIC DIAGNOSIS: A. SOFT TISSUE MASS, LEFT AXILLARY, EXCISION: - Invasive ductal carcinoma of breast. See comment - Lymphoid tissue is not identified  Receptor Status: ER(95%), PR (95%), Her2-neu (POS), Ki-(15%)  10/05/19 FINAL MICROSCOPIC DIAGNOSIS: A. BREAST, RIGHT, LUMPECTOMY: - Intraductal papilloma. - Biopsy site. - No malignancy identified. B. BREAST, LEFT, LUMPECTOMY: - Fibrocystic change. - Incidental intraductal papilloma. - No malignancy identified. C. BREAST, LEFT, RE-EXCISION: - Invasive ductal carcinoma, grade 2, spanning 1.7 cm. - Intermediate grade ductal carcinoma in situ. - Invasive and in situ carcinoma are 0.1 cm from the anterior margin focally (not on ink). - Extensive foreign body giant cell reaction. - See oncology table. D. LYMPH NODE, LEFT AXILLARY, SENTINEL, EXCISION: - One of one lymph nodes negative for carcinoma (0/1). E. LYMPH NODE, LEFT AXILLARY, SENTINEL, EXCISION: - One of one lymph nodes negative for carcinoma (0/1). F. LYMPH NODE, LEFT AXILLARY, SENTINEL, EXCISION: - One of one lymph nodes negative for carcinoma (0/1). G. LYMPH NODE, LEFT AXILLARY, SENTINEL, EXCISION: - One of one lymph nodes negative for carcinoma (0/1). H. LYMPH NODE, LEFT AXILLARY, SENTINEL, EXCISION: - One of one lymph nodes negative for carcinoma (0/1). I. LYMPH NODE, LEFT AXILLARY, SENTINEL, EXCISION: - One of one lymph nodes negative for carcinoma (0/1).   Did patient present with symptoms or was this found on screening mammography?: She presented to her PCP with reports of an enlarging left axillary mass. She is not sure how long it had been present but it caused occasional burning and was getting bigger.   Biopsies also completed to: Left breast 08/19/19 Right Breast 08/20/19  Past/Anticipated interventions by surgeon, if any: 07/12/19 Procedure(s): EXCISION  LEFT AXILLARY MASS( 5 cm ) Surgeon(s): Coralie Keens, MD  10/05/19 Procedure(s): BILATERAL BREAST LUMPECTOMY WITH BILATERAL RADIOACTIVE SEEDS AND LEFT DEEP AXILLARY  SENTINEL LYMPH NODE BIOPSY RE-EXCISION OF LEFT BREAST CANCER INJECTION OF BLUE DYE Surgeon(s): Coralie Keens, MD   Past/Anticipated interventions by medical oncology, if any:  10/14/19 Dr. Lindi Adie Treatment plan: 1. Oncotype DX to determine if she would benefit from chemotherapy 2.Adjuvant radiation therapy 3.Follow-up adjuvant antiestrogen therapy  Left axillary seroma: Patient might need drainage on Monday she is seeing Dr. Ninfa Linden. Return to clinic based upon Oncotype DX test result  --10/18/19 Breast navigator- Called pt and discussed oncotype score of 0 and that she does not need chemo. Received verbal understanding. Verified appt with Dr. Isidore Moos. Denies further needs or questions at this time.  Lymphedema issues, if any: Patient states that she has some swelling  At her surgical site also states that she has  some drainage . States that her surgeon has drained the site twice since the surgery.   Pain issues, if any:   Patient states that she has pain at her surgical site sometime.  SAFETY ISSUES:  Prior radiation? No  Pacemaker/ICD? No  Possible current pregnancy? No  Is the patient on methotrexate? No  Current Complaints / other details:  Patient states that her breast is sore on the left side. States that she also have some numbness. Vitals:   10/27/19 1338  BP: 134/72  Pulse: 87  Resp: 20  Temp: 98.2 F (36.8 C)  SpO2: 98%  Weight: 235 lb 12.8 oz (107 kg)

## 2019-10-27 ENCOUNTER — Encounter: Payer: Self-pay | Admitting: Radiation Oncology

## 2019-10-27 ENCOUNTER — Ambulatory Visit
Admission: RE | Admit: 2019-10-27 | Discharge: 2019-10-27 | Disposition: A | Discharge status: Home / Self Care | Payer: Medicare Other | Source: Ambulatory Visit | Attending: Radiation Oncology | Admitting: Radiation Oncology

## 2019-10-27 ENCOUNTER — Other Ambulatory Visit: Payer: Self-pay

## 2019-10-27 ENCOUNTER — Ambulatory Visit: Payer: Medicare Other | Admitting: Radiation Oncology

## 2019-10-27 VITALS — BP 134/72 | HR 87 | Temp 98.2°F | Resp 20 | Wt 235.8 lb

## 2019-10-27 DIAGNOSIS — C50412 Malignant neoplasm of upper-outer quadrant of left female breast: Secondary | ICD-10-CM | POA: Diagnosis present

## 2019-10-27 DIAGNOSIS — Z79899 Other long term (current) drug therapy: Secondary | ICD-10-CM | POA: Diagnosis not present

## 2019-10-27 DIAGNOSIS — Z170 Estrogen receptor positive status [ER+]: Secondary | ICD-10-CM

## 2019-10-27 NOTE — Progress Notes (Signed)
Radiation Oncology         [phone] ________________________________  Name: Connie Marshall MRN: [id number]  Date: 10/27/2019  DOB: March 24, 1946  Follow-Up Visit Note  Outpatient  CC: [name], MD  [name], MD  Diagnosis:      ICD-10-CM   1. Carcinoma of upper-outer quadrant of left breast in female, estrogen receptor positive ([place])  C50.412    Z17.0      Cancer Staging Bilateral breast cancer ([place]) Staging form: Breast, AJCC 8th Edition - Clinical stage from 08/19/2019: Stage IIA (cT3, cN0, cM0, G2, ER+, PR+, HER2-) - Signed by Nicholas Lose, MD on 08/19/2019 - Pathologic stage from 10/14/2019: Stage IB (pT3, pN0(sn), cM0, G2, ER+, PR+, HER2-) - Signed by Nicholas Lose, MD on 10/14/2019  Carcinoma of upper-outer quadrant of left breast in female, estrogen receptor positive ([place]) Staging form: Breast, AJCC 8th Edition - Clinical: Stage IIA (cT3, cN0, cM0, G2, ER+, PR+, HER2-) - Signed by Eppie Gibson, MD on 08/25/2019   CHIEF COMPLAINT: Here to discuss management of left breast cancer  Narrative:  The patient returns today for follow-up to discuss radiation treatment options. She was seen in consultation on 08/25/2019.    She opted to proceed with bilateral lumpectomies with left sentinel lymph node biopsy on date of 10/05/2019 with pathology report revealing: tumor size of 1.7 cm (residual; please recall prior lumpectomy yielded tumor ~6cm in size); histology of ductal carcinoma; margin status to invasive disease of 0.1 cm and margin status to in situ disease of 0.1 cm; nodal status of negative (0/6); Grade 2.  Oncotype DX was obtained on the final surgical sample and the recurrence score of 0 predicts a risk of recurrence outside the breast over the next 9 years of 0%, if the patient's only systemic therapy is an antiestrogen for 5 years.  It also predicts no benefit from chemotherapy.  Symptomatically, the patient reports: Numbness, swelling as well as some drainage  and pain at the left axillary/breast surgical site. She states her surgeon has drained the site twice since surgery.             ALLERGIES:  is allergic to tape.  Meds: Current Outpatient Medications  Medication Sig Dispense Refill  . fosinopril (MONOPRIL) 20 MG tablet Take 1 tablet (20 mg total) by mouth daily. 90 tablet 3  . furosemide (LASIX) 40 MG tablet 1 tab by mouth in the AM, and 1 in the PM as needed for leg swelling 180 tablet 3  . 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 simvastatin (ZOCOR) 20 MG tablet Take 1 tablet (20 mg total) by mouth every evening. 90 tablet 1  . traMADol (ULTRAM) 50 MG tablet Take 1 tablet (50 mg total) by mouth every 6 (six) hours as needed. 30 tablet 0  . VIT B12-METHIONINE-INOS-CHOL IM Inject into the muscle every 30 (thirty) days.    . Vitamin D, Ergocalciferol, (DRISDOL) 1.25 MG (50000 UT) CAPS capsule Take 1 capsule (50,000 Units total) by mouth every 7 (seven) days. 12 capsule 0  . halobetasol (ULTRAVATE) 0.05 % ointment      Current Facility-Administered Medications  Medication Dose Route Frequency Provider Last Rate Last Admin  . denosumab (PROLIA) injection 60 mg  60 mg Subcutaneous Once [name], MD        Physical Findings:  weight is 235 lb 12.8 oz (107 kg). Her temperature is 98.2 F (36.8 C). Her blood pressure is 134/72  and her pulse is 87. Her respiration is 20 and oxygen saturation is 98%. .     General: Alert and oriented, in no acute distress Psychiatric: Judgment and insight are intact. Affect is appropriate. Breast exam reveals large pendulous breasts, seroma is visible and palpable at left UOQ/axilla. Not actively oozing.  Lab Findings: Lab Results  Component Value Date   WBC 4.0 09/30/2019   HGB 13.5 09/30/2019   HCT 39.8 09/30/2019   MCV 87.4 09/30/2019   PLT 208.0 09/30/2019    Radiographic Findings: NM Sentinel Node Inj-No Rpt (Breast)  Result Date: 10/05/2019 Sulfur colloid was  injected by the nuclear medicine technologist for melanoma sentinel node.    Impression/Plan: This is a lovely 74 year old woman with left Breast Cancer  We discussed adjuvant radiotherapy today. We discussed the option of standard fractionation vs hypofractionation; she understands that a 6-7 week regimen of RT is more standard for her disease, but given her age, other medical issues, and COVID risks, there are potential benefits of condensing her treatment into a 4-week course.  I think these benefits would outweigh potential risks.  I will treat the left breast.  We can consider treating her in the prone position if this is feasible, given her anatomy.  She is enthusiastic about pursuing a hypofractionated course but needs more time to heal.  It is possible that her seroma will be drained once more.  I have moved back for simulation, which was scheduled for today, by 2 and half weeks.   Treatment with radiotherapy will be given in order to reduce the risk of locoregional recurrence by 2/3.  The risks, benefits and side effects of this treatment were discussed in detail.  She understands that radiotherapy is associated with skin irritation, breast and axillary soreness, and fatigue in the acute setting. Late effects can include permanent tissue injury, cosmetic changes and rare injury to internal organs.  No guarantees of treatment were made.  She is enthusiastic about proceeding with treatment. A consent form has been signed and placed in her chart.  A total of 3 medically necessary complex treatment devices will be fabricated and supervised by me: 2 fields with MLCs for custom blocks to protect heart, and lungs;  and, a Vac-lok. MORE COMPLEX DEVICES MAY BE MADE IN DOSIMETRY FOR FIELD IN FIELD BEAMS FOR DOSE HOMOGENEITY.  I have requested : 3D Simulation which is medically necessary to give adequate dose to at risk tissues while sparing lungs and heart.  I have requested a DVH of the following  structures: lungs, heart, left lumpectomy cavity.    The patient will receive 42.56 Gy in 16  fractions to the left breast with 2 fields.  This will be followed by a boost.  On date of service, in total, I spent 40 minutes on this encounter.  _____________________________________   Eppie Gibson, MD   This document serves as a record of services personally performed by Eppie Gibson, MD. It was created on her behalf by Wilburn Mylar, a trained medical scribe. The creation of this record is based on the scribe's personal observations and the provider's statements to them. This document has been checked and approved by the attending provider.

## 2019-10-28 ENCOUNTER — Encounter: Payer: Self-pay | Admitting: *Deleted

## 2019-10-29 ENCOUNTER — Encounter: Payer: Self-pay | Admitting: Radiation Oncology

## 2019-11-15 ENCOUNTER — Other Ambulatory Visit: Payer: Self-pay

## 2019-11-15 ENCOUNTER — Ambulatory Visit
Admission: RE | Admit: 2019-11-15 | Discharge: 2019-11-15 | Disposition: A | Discharge status: Home / Self Care | Payer: Medicare Other | Source: Ambulatory Visit | Attending: Radiation Oncology | Admitting: Radiation Oncology

## 2019-11-15 DIAGNOSIS — C50412 Malignant neoplasm of upper-outer quadrant of left female breast: Secondary | ICD-10-CM | POA: Insufficient documentation

## 2019-11-15 DIAGNOSIS — Z510 Encounter for antineoplastic radiation therapy: Secondary | ICD-10-CM | POA: Insufficient documentation

## 2019-11-15 DIAGNOSIS — C50911 Malignant neoplasm of unspecified site of right female breast: Secondary | ICD-10-CM | POA: Diagnosis present

## 2019-11-15 NOTE — Progress Notes (Signed)
  Radiation Oncology         [phone] ________________________________  Name: Connie Marshall MRN: [id number]  Date: 11/15/2019  DOB: 24-Jan-1946  SIMULATION AND TREATMENT PLANNING NOTE    Outpatient  DIAGNOSIS:     ICD-10-CM   1. Carcinoma of upper-outer quadrant of left breast in female, estrogen receptor positive ([place])  C50.412    Z17.0     NARRATIVE:  The patient was brought to the [facility].  Identity was confirmed.  All relevant records and images related to the planned course of therapy were reviewed.  The patient freely provided informed written consent to proceed with treatment after reviewing the details related to the planned course of therapy. The consent form was witnessed and verified by the simulation staff.    Then, the patient was set-up in a stable reproducible prone position for radiation therapy with arms over her head while on a breast board; her left breast fell through the board's aperture with gravity.  CT images were obtained.  Surface markings were placed.  The CT images were loaded into the planning software.    TREATMENT PLANNING NOTE: Treatment planning then occurred.  The radiation prescription was entered and confirmed.     A total of 2 medically necessary complex treatment devices were fabricated and supervised by me: 2 fields with MLCs for custom blocks to protect heart, and lungs. MORE COMPLEX DEVICES MAY BE MADE IN DOSIMETRY FOR FIELD IN FIELD BEAMS FOR DOSE HOMOGENEITY.  I have requested : 3D Simulation which is medically necessary to give adequate dose to at risk tissues while sparing lungs and heart.  I have requested a DVH of the following structures: lungs, heart, left lumpectomy cavity.    The patient will receive 42.56 Gy in 16 fractions to the left breast with 2 tangential fields. This will be followed by a boost.  Optical Surface Tracking Plan:  Since intensity modulated radiotherapy (IMRT) and 3D conformal radiation treatment  methods are predicated on accurate and precise positioning for treatment, intrafraction motion monitoring is medically necessary to ensure accurate and safe treatment delivery. The ability to quantify intrafraction motion without excessive ionizing radiation dose can only be performed with optical surface tracking. Accordingly, surface imaging offers the opportunity to obtain 3D measurements of patient position throughout IMRT and 3D treatments without excessive radiation exposure. I am ordering optical surface tracking for this patient's upcoming course of radiotherapy.  ________________________________   Reference:  Ursula Alert, J, et al. Surface imaging-based analysis of intrafraction motion for breast radiotherapy patients.Journal of [place], n. 6, nov. 2014. ISSN [id number].  Available at: <http://www.jacmp.org/index.php/jacmp/article/view/4957>.    -----------------------------------  Eppie Gibson, MD

## 2019-11-16 ENCOUNTER — Encounter: Payer: Self-pay | Admitting: *Deleted

## 2019-11-16 ENCOUNTER — Other Ambulatory Visit: Payer: Medicare Other

## 2019-11-22 ENCOUNTER — Telehealth: Payer: Self-pay | Admitting: Hematology and Oncology

## 2019-11-22 ENCOUNTER — Ambulatory Visit (INDEPENDENT_AMBULATORY_CARE_PROVIDER_SITE_OTHER): Payer: Medicare Other | Admitting: *Deleted

## 2019-11-22 ENCOUNTER — Ambulatory Visit
Admission: RE | Admit: 2019-11-22 | Discharge: 2019-11-22 | Disposition: A | Discharge status: Home / Self Care | Payer: Medicare Other | Source: Ambulatory Visit | Attending: Radiation Oncology | Admitting: Radiation Oncology

## 2019-11-22 ENCOUNTER — Other Ambulatory Visit: Payer: Self-pay

## 2019-11-22 DIAGNOSIS — Z510 Encounter for antineoplastic radiation therapy: Secondary | ICD-10-CM | POA: Diagnosis not present

## 2019-11-22 DIAGNOSIS — E538 Deficiency of other specified B group vitamins: Secondary | ICD-10-CM | POA: Diagnosis not present

## 2019-11-22 MED ORDER — CYANOCOBALAMIN 1000 MCG/ML IJ SOLN
1000.0000 ug | Freq: Once | INTRAMUSCULAR | Status: AC
  Administered 2019-11-22: 09:00:00 1000 ug via INTRAMUSCULAR

## 2019-11-22 NOTE — Progress Notes (Deleted)
Pls cosign for B12 injection since Dr. Jenny Reichmann is not in the office this am../lmb

## 2019-11-22 NOTE — Progress Notes (Signed)
Pls sign for B12 since PCO is out of the office this am

## 2019-11-22 NOTE — Telephone Encounter (Signed)
Scheduled per 2/2 sch msg. Called and spoke with pt, confirmed 3/9 appt

## 2019-11-23 ENCOUNTER — Other Ambulatory Visit: Payer: Self-pay

## 2019-11-23 ENCOUNTER — Encounter: Payer: Self-pay | Admitting: Internal Medicine

## 2019-11-23 ENCOUNTER — Ambulatory Visit (INDEPENDENT_AMBULATORY_CARE_PROVIDER_SITE_OTHER): Payer: Medicare Other | Admitting: Internal Medicine

## 2019-11-23 ENCOUNTER — Ambulatory Visit
Admission: RE | Admit: 2019-11-23 | Discharge: 2019-11-23 | Disposition: A | Discharge status: Home / Self Care | Payer: Medicare Other | Source: Ambulatory Visit | Attending: Radiation Oncology | Admitting: Radiation Oncology

## 2019-11-23 VITALS — BP 140/82 | HR 72 | Temp 98.0°F | Ht 69.0 in | Wt 242.0 lb

## 2019-11-23 DIAGNOSIS — I10 Essential (primary) hypertension: Secondary | ICD-10-CM | POA: Diagnosis not present

## 2019-11-23 DIAGNOSIS — C50412 Malignant neoplasm of upper-outer quadrant of left female breast: Secondary | ICD-10-CM

## 2019-11-23 DIAGNOSIS — E559 Vitamin D deficiency, unspecified: Secondary | ICD-10-CM

## 2019-11-23 DIAGNOSIS — Z510 Encounter for antineoplastic radiation therapy: Secondary | ICD-10-CM | POA: Diagnosis not present

## 2019-11-23 DIAGNOSIS — Z0000 Encounter for general adult medical examination without abnormal findings: Secondary | ICD-10-CM

## 2019-11-23 DIAGNOSIS — E119 Type 2 diabetes mellitus without complications: Secondary | ICD-10-CM

## 2019-11-23 DIAGNOSIS — Z170 Estrogen receptor positive status [ER+]: Secondary | ICD-10-CM

## 2019-11-23 DIAGNOSIS — E538 Deficiency of other specified B group vitamins: Secondary | ICD-10-CM

## 2019-11-23 DIAGNOSIS — E785 Hyperlipidemia, unspecified: Secondary | ICD-10-CM

## 2019-11-23 MED ORDER — FUROSEMIDE 40 MG PO TABS: ORAL_TABLET | ORAL | 3 refills | Status: DC

## 2019-11-23 MED ORDER — FOSINOPRIL SODIUM 20 MG PO TABS: 20.0000 mg | ORAL_TABLET | Freq: Every day | ORAL | 3 refills | Status: DC

## 2019-11-23 MED ORDER — SIMVASTATIN 20 MG PO TABS: 20.0000 mg | ORAL_TABLET | Freq: Every evening | ORAL | 3 refills | Status: DC

## 2019-11-23 MED ORDER — RADIAPLEXRX EX GEL: Freq: Once | CUTANEOUS | Status: DC

## 2019-11-23 NOTE — Progress Notes (Signed)
Pt here for patient teaching.  Pt given Radiation and You booklet, skin care instructions and Radiaplex gel.  Reviewed areas of pertinence such as fatigue, skin changes, breast tenderness and breast swelling . Pt able to give teach back of to pat skin, use unscented/gentle soap and drink plenty of water,apply Radiaplex bid, avoid applying anything to skin within 4 hours of treatment, avoid wearing an under wire bra and to use an electric razor if they must shave. Pt verbalizes understanding of information given and will contact nursing with any questions or concerns.     Http://rtanswers.org/treatmentinformation/whattoexpect/index

## 2019-11-23 NOTE — Assessment & Plan Note (Signed)
stable overall by history and exam, recent data reviewed with pt, and pt to continue medical treatment as before,  to f/u any worsening symptoms or concerns  

## 2019-11-23 NOTE — Assessment & Plan Note (Signed)
For oral replacement 

## 2019-11-23 NOTE — Patient Instructions (Addendum)
Ok to take the Vitamin D pills at 2000 units per day  Ok to change the B12 shots to the pills = 1000 mcg daily  Please remember to make your eye doctor appt  Please have your COVID shot #2 as you have planned, then check with your insurance, and if you are covered, please call for a Nurse Visit appt for Shingles shot #1 about 2 wks or after the covid shot; then repeat the Shingles shot 2 months later as well  Please continue all other medications as before, and refills have been done if requested - the lasix and fosinopril and statin (no need for the prolia accidentally on your list today)  Please have the pharmacy call with any other refills you may need.  Please continue your efforts at being more active, low cholesterol diet, and weight control.  You are otherwise up to date with prevention measures today.  Please keep your appointments with your specialists as you may have planned - Dr Ninfa Linden  We can hold on further blood work today  You would not need the [employer] as you are over 3yo.  Your handicapped parking application was signed today  Please make an Appointment to return in 6 months, or sooner if needed, also with Lab Appointment for testing done 3-5 days before at the [facility] (so this is for TWO appointments - please see the scheduling desk as you leave)

## 2019-11-23 NOTE — Assessment & Plan Note (Signed)

## 2019-11-23 NOTE — Assessment & Plan Note (Signed)
For b12 replacement

## 2019-11-23 NOTE — Progress Notes (Signed)
Subjective:    Patient ID: Connie Marshall, female    DOB: 08-08-1946, 74 y.o.   MRN: [id number]  HPI  Here for wellness and f/u;  Overall doing ok;  Pt denies Chest pain, worsening SOB, DOE, wheezing, orthopnea, PND, worsening LE edema, palpitations, dizziness or syncope.  Pt denies neurological change such as new headache, facial or extremity weakness.  Pt denies polydipsia, polyuria, or low sugar symptoms. Pt states overall good compliance with treatment and medications, good tolerability, and has been trying to follow appropriate diet.  Pt denies worsening depressive symptoms, suicidal ideation or panic. No fever, night sweats, wt loss, loss of appetite, or other constitutional symptoms.  Pt states good ability with ADL's, has low fall risk, home safety reviewed and adequate, no other significant changes in hearing or vision, and only occasionally active with exercise. No new complaints Past Medical History:  Diagnosis Date  . Axillary mass, left   . Cancer ([place])   . Complication of anesthesia   . Diverticulosis   . Hemorrhoids   . Hyperlipidemia   . Hypertension   . IBS (irritable bowel syndrome)   . Obesity   . PONV (postoperative nausea and vomiting)   . Pre-diabetes    Past Surgical History:  Procedure Laterality Date  . BREAST LUMPECTOMY WITH RADIOACTIVE SEED AND SENTINEL LYMPH NODE BIOPSY Bilateral 10/05/2019   Procedure: BILATERAL BREAST LUMPECTOMY WITH BILATERAL RADIOACTIVE SEEDS AND LEFT SENTINEL LYMPH NODE BIOPSY;  Surgeon: Coralie Keens, MD;  Location: [facility];  Service: General;  Laterality: Bilateral;  . CESAREAN SECTION     2 times  . CHOLECYSTECTOMY     laproscopic  . CHOLECYSTECTOMY, LAPAROSCOPIC    . kidney stone removed  2011  . MASS EXCISION Left 07/12/2019   Procedure: EXCISION LEFT AXILLARY MASS;  Surgeon: Coralie Keens, MD;  Location: [facility];  Service: General;  Laterality: Left;  . RE-EXCISION OF BREAST LUMPECTOMY  Left 10/05/2019   Procedure: RE-EXCISION OF LEFT BREAST CANCER;  Surgeon: Coralie Keens, MD;  Location: [facility];  Service: General;  Laterality: Left;  . THYROGLOSSAL DUCT CYST  2000  . TONSILLECTOMY  [id number]  . umbilical herniorrhapy    . VAGINAL HYSTERECTOMY  2003    reports that she has never smoked. She has never used smokeless tobacco. She reports that she does not drink alcohol or use drugs. family history includes Diabetes in her father; Hypertension in her father and mother; Parkinson's disease in her sister; Prostate cancer in her brother. Allergies  Allergen Reactions  . Tape     ALLERGIC TO NON-ALLERGIC TAPE-causes a rash   Current Outpatient Medications on File Prior to Visit  Medication Sig Dispense Refill  . halobetasol (ULTRAVATE) 0.05 % ointment     . 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 traMADol (ULTRAM) 50 MG tablet Take 1 tablet (50 mg total) by mouth every 6 (six) hours as needed. 30 tablet 0  . VIT B12-METHIONINE-INOS-CHOL IM Inject into the muscle every 30 (thirty) days.    . Vitamin D, Ergocalciferol, (DRISDOL) 1.25 MG (50000 UT) CAPS capsule Take 1 capsule (50,000 Units total) by mouth every 7 (seven) days. 12 capsule 0   No current facility-administered medications on file prior to visit.   Review of Systems All otherwise neg per pt     Objective:   Physical Exam BP 140/82   Pulse 72   Temp 98 F (36.7 C)  Ht 5\' 9"  (1.753 m)   Wt 242 lb (109.8 kg)   SpO2 98%   BMI 35.74 kg/m  VS noted,  Constitutional: Pt appears in NAD HENT: Head: NCAT.  Right Ear: External ear normal.  Left Ear: External ear normal.  Eyes: . Pupils are equal, round, and reactive to light. Conjunctivae and EOM are normal Nose: without d/c or deformity Neck: Neck supple. Gross normal ROM Cardiovascular: Normal rate and regular rhythm.   Pulmonary/Chest: Effort normal and breath sounds without rales or wheezing.  Abd:  Soft, NT, ND,  + BS, no organomegaly Neurological: Pt is alert. At baseline orientation, motor grossly intact Skin: Skin is warm. No rashes, other new lesions, no LE edema Psychiatric: Pt behavior is normal without agitation  All otherwise neg per pt  Lab Results  Component Value Date   WBC 4.0 09/30/2019   HGB 13.5 09/30/2019   HCT 39.8 09/30/2019   PLT 208.0 09/30/2019   GLUCOSE 120 (H) 09/30/2019   CHOL 126 09/30/2019   TRIG 94.0 09/30/2019   HDL 38.40 (L) 09/30/2019   LDLDIRECT 115.2 09/26/2008   LDLCALC 69 09/30/2019   ALT 11 09/30/2019   AST 14 09/30/2019   NA 138 09/30/2019   K 3.6 09/30/2019   CL 105 09/30/2019   CREATININE 0.82 09/30/2019   BUN 16 09/30/2019   CO2 25 09/30/2019   TSH 1.68 09/30/2019   HGBA1C 6.3 09/30/2019   MICROALBUR <0.7 09/30/2019       Assessment & Plan:

## 2019-11-24 ENCOUNTER — Ambulatory Visit
Admission: RE | Admit: 2019-11-24 | Discharge: 2019-11-24 | Disposition: A | Discharge status: Home / Self Care | Payer: Medicare Other | Source: Ambulatory Visit | Attending: Radiation Oncology | Admitting: Radiation Oncology

## 2019-11-24 ENCOUNTER — Other Ambulatory Visit: Payer: Self-pay

## 2019-11-24 DIAGNOSIS — Z510 Encounter for antineoplastic radiation therapy: Secondary | ICD-10-CM | POA: Diagnosis not present

## 2019-11-24 NOTE — Progress Notes (Signed)
b12 Injection given.   Connie Marshall J Connie Capano, MD  

## 2019-11-25 ENCOUNTER — Other Ambulatory Visit: Payer: Self-pay

## 2019-11-25 ENCOUNTER — Ambulatory Visit
Admission: RE | Admit: 2019-11-25 | Discharge: 2019-11-25 | Disposition: A | Discharge status: Home / Self Care | Payer: Medicare Other | Source: Ambulatory Visit | Attending: Radiation Oncology | Admitting: Radiation Oncology

## 2019-11-25 DIAGNOSIS — Z510 Encounter for antineoplastic radiation therapy: Secondary | ICD-10-CM | POA: Diagnosis not present

## 2019-11-26 ENCOUNTER — Ambulatory Visit
Admission: RE | Admit: 2019-11-26 | Discharge: 2019-11-26 | Disposition: A | Discharge status: Home / Self Care | Payer: Medicare Other | Source: Ambulatory Visit | Attending: Radiation Oncology | Admitting: Radiation Oncology

## 2019-11-26 ENCOUNTER — Other Ambulatory Visit: Payer: Self-pay

## 2019-11-26 DIAGNOSIS — Z510 Encounter for antineoplastic radiation therapy: Secondary | ICD-10-CM | POA: Diagnosis not present

## 2019-11-29 ENCOUNTER — Ambulatory Visit: Admission: RE | Admit: 2019-11-29 | Payer: Medicare Other | Source: Ambulatory Visit

## 2019-11-29 DIAGNOSIS — Z510 Encounter for antineoplastic radiation therapy: Secondary | ICD-10-CM | POA: Diagnosis not present

## 2019-11-30 ENCOUNTER — Other Ambulatory Visit: Payer: Self-pay

## 2019-11-30 ENCOUNTER — Ambulatory Visit
Admission: RE | Admit: 2019-11-30 | Discharge: 2019-11-30 | Disposition: A | Discharge status: Home / Self Care | Payer: Medicare Other | Source: Ambulatory Visit | Attending: Radiation Oncology | Admitting: Radiation Oncology

## 2019-11-30 DIAGNOSIS — Z510 Encounter for antineoplastic radiation therapy: Secondary | ICD-10-CM | POA: Diagnosis not present

## 2019-12-01 ENCOUNTER — Other Ambulatory Visit: Payer: Self-pay

## 2019-12-01 ENCOUNTER — Ambulatory Visit
Admission: RE | Admit: 2019-12-01 | Discharge: 2019-12-01 | Disposition: A | Discharge status: Home / Self Care | Payer: Medicare Other | Source: Ambulatory Visit | Attending: Radiation Oncology | Admitting: Radiation Oncology

## 2019-12-01 DIAGNOSIS — Z510 Encounter for antineoplastic radiation therapy: Secondary | ICD-10-CM | POA: Diagnosis not present

## 2019-12-02 ENCOUNTER — Ambulatory Visit: Payer: Medicare Other

## 2019-12-03 ENCOUNTER — Other Ambulatory Visit: Payer: Self-pay

## 2019-12-03 ENCOUNTER — Ambulatory Visit
Admission: RE | Admit: 2019-12-03 | Discharge: 2019-12-03 | Disposition: A | Discharge status: Home / Self Care | Payer: Medicare Other | Source: Ambulatory Visit | Attending: Radiation Oncology | Admitting: Radiation Oncology

## 2019-12-03 DIAGNOSIS — Z510 Encounter for antineoplastic radiation therapy: Secondary | ICD-10-CM | POA: Diagnosis not present

## 2019-12-06 ENCOUNTER — Other Ambulatory Visit: Payer: Self-pay

## 2019-12-06 ENCOUNTER — Ambulatory Visit
Admission: RE | Admit: 2019-12-06 | Discharge: 2019-12-06 | Disposition: A | Discharge status: Home / Self Care | Payer: Medicare Other | Source: Ambulatory Visit | Attending: Radiation Oncology | Admitting: Radiation Oncology

## 2019-12-06 DIAGNOSIS — Z510 Encounter for antineoplastic radiation therapy: Secondary | ICD-10-CM | POA: Diagnosis not present

## 2019-12-07 ENCOUNTER — Other Ambulatory Visit: Payer: Self-pay

## 2019-12-07 ENCOUNTER — Ambulatory Visit
Admission: RE | Admit: 2019-12-07 | Discharge: 2019-12-07 | Disposition: A | Discharge status: Home / Self Care | Payer: Medicare Other | Source: Ambulatory Visit | Attending: Radiation Oncology | Admitting: Radiation Oncology

## 2019-12-07 DIAGNOSIS — Z510 Encounter for antineoplastic radiation therapy: Secondary | ICD-10-CM | POA: Diagnosis not present

## 2019-12-08 ENCOUNTER — Ambulatory Visit
Admission: RE | Admit: 2019-12-08 | Discharge: 2019-12-08 | Disposition: A | Discharge status: Home / Self Care | Payer: Medicare Other | Source: Ambulatory Visit | Attending: Radiation Oncology | Admitting: Radiation Oncology

## 2019-12-08 ENCOUNTER — Other Ambulatory Visit: Payer: Self-pay

## 2019-12-08 DIAGNOSIS — Z510 Encounter for antineoplastic radiation therapy: Secondary | ICD-10-CM | POA: Diagnosis not present

## 2019-12-09 ENCOUNTER — Other Ambulatory Visit: Payer: Self-pay

## 2019-12-09 ENCOUNTER — Ambulatory Visit
Admission: RE | Admit: 2019-12-09 | Discharge: 2019-12-09 | Disposition: A | Discharge status: Home / Self Care | Payer: Medicare Other | Source: Ambulatory Visit | Attending: Radiation Oncology | Admitting: Radiation Oncology

## 2019-12-09 DIAGNOSIS — Z510 Encounter for antineoplastic radiation therapy: Secondary | ICD-10-CM | POA: Diagnosis not present

## 2019-12-10 ENCOUNTER — Other Ambulatory Visit: Payer: Self-pay

## 2019-12-10 ENCOUNTER — Ambulatory Visit
Admission: RE | Admit: 2019-12-10 | Discharge: 2019-12-10 | Disposition: A | Discharge status: Home / Self Care | Payer: Medicare Other | Source: Ambulatory Visit | Attending: Radiation Oncology | Admitting: Radiation Oncology

## 2019-12-10 DIAGNOSIS — Z510 Encounter for antineoplastic radiation therapy: Secondary | ICD-10-CM | POA: Diagnosis not present

## 2019-12-13 ENCOUNTER — Ambulatory Visit
Admission: RE | Admit: 2019-12-13 | Discharge: 2019-12-13 | Disposition: A | Discharge status: Home / Self Care | Payer: Medicare Other | Source: Ambulatory Visit | Attending: Radiation Oncology | Admitting: Radiation Oncology

## 2019-12-13 ENCOUNTER — Other Ambulatory Visit: Payer: Self-pay

## 2019-12-13 DIAGNOSIS — C50911 Malignant neoplasm of unspecified site of right female breast: Secondary | ICD-10-CM | POA: Insufficient documentation

## 2019-12-13 DIAGNOSIS — Z170 Estrogen receptor positive status [ER+]: Secondary | ICD-10-CM | POA: Diagnosis present

## 2019-12-13 DIAGNOSIS — C50412 Malignant neoplasm of upper-outer quadrant of left female breast: Secondary | ICD-10-CM | POA: Insufficient documentation

## 2019-12-13 DIAGNOSIS — Z510 Encounter for antineoplastic radiation therapy: Secondary | ICD-10-CM | POA: Insufficient documentation

## 2019-12-13 MED ORDER — SONAFINE EX EMUL
1.0000 "application " | Freq: Once | CUTANEOUS | Status: AC
  Administered 2019-12-13: 1 via TOPICAL

## 2019-12-14 ENCOUNTER — Other Ambulatory Visit: Payer: Self-pay

## 2019-12-14 ENCOUNTER — Ambulatory Visit
Admission: RE | Admit: 2019-12-14 | Discharge: 2019-12-14 | Disposition: A | Discharge status: Home / Self Care | Payer: Medicare Other | Source: Ambulatory Visit | Attending: Radiation Oncology | Admitting: Radiation Oncology

## 2019-12-14 DIAGNOSIS — C50412 Malignant neoplasm of upper-outer quadrant of left female breast: Secondary | ICD-10-CM | POA: Diagnosis not present

## 2019-12-15 ENCOUNTER — Other Ambulatory Visit: Payer: Self-pay

## 2019-12-15 ENCOUNTER — Ambulatory Visit
Admission: RE | Admit: 2019-12-15 | Discharge: 2019-12-15 | Disposition: A | Discharge status: Home / Self Care | Payer: Medicare Other | Source: Ambulatory Visit | Attending: Radiation Oncology | Admitting: Radiation Oncology

## 2019-12-15 ENCOUNTER — Ambulatory Visit: Payer: Medicare Other

## 2019-12-15 DIAGNOSIS — C50412 Malignant neoplasm of upper-outer quadrant of left female breast: Secondary | ICD-10-CM | POA: Diagnosis not present

## 2019-12-16 ENCOUNTER — Ambulatory Visit: Payer: Medicare Other

## 2019-12-16 ENCOUNTER — Ambulatory Visit
Admission: RE | Admit: 2019-12-16 | Discharge: 2019-12-16 | Disposition: A | Discharge status: Home / Self Care | Payer: Medicare Other | Source: Ambulatory Visit | Attending: Radiation Oncology | Admitting: Radiation Oncology

## 2019-12-16 ENCOUNTER — Other Ambulatory Visit: Payer: Self-pay

## 2019-12-16 DIAGNOSIS — C50412 Malignant neoplasm of upper-outer quadrant of left female breast: Secondary | ICD-10-CM | POA: Diagnosis not present

## 2019-12-17 ENCOUNTER — Other Ambulatory Visit: Payer: Self-pay

## 2019-12-17 ENCOUNTER — Ambulatory Visit
Admission: RE | Admit: 2019-12-17 | Discharge: 2019-12-17 | Disposition: A | Discharge status: Home / Self Care | Payer: Medicare Other | Source: Ambulatory Visit | Attending: Radiation Oncology | Admitting: Radiation Oncology

## 2019-12-17 DIAGNOSIS — C50412 Malignant neoplasm of upper-outer quadrant of left female breast: Secondary | ICD-10-CM | POA: Diagnosis not present

## 2019-12-20 ENCOUNTER — Encounter: Payer: Self-pay | Admitting: *Deleted

## 2019-12-20 ENCOUNTER — Ambulatory Visit
Admission: RE | Admit: 2019-12-20 | Discharge: 2019-12-20 | Disposition: A | Discharge status: Home / Self Care | Payer: Medicare Other | Source: Ambulatory Visit | Attending: Radiation Oncology | Admitting: Radiation Oncology

## 2019-12-20 ENCOUNTER — Other Ambulatory Visit: Payer: Self-pay

## 2019-12-20 DIAGNOSIS — C50412 Malignant neoplasm of upper-outer quadrant of left female breast: Secondary | ICD-10-CM | POA: Diagnosis not present

## 2019-12-20 NOTE — Progress Notes (Signed)
Patient Care Team: [name], MD as PCP - General (Internal Medicine) Kathie Rhodes, MD (Urology) Dyke Maes, [place] (Optometry) Lafayette Dragon, MD (Inactive) (Gastroenterology) Thornell Sartorius, MD (Otolaryngology) Mauro Kaufmann, RN as Oncology Nurse Navigator Rockwell Germany, RN as Oncology Nurse Navigator  DIAGNOSIS:    ICD-10-CM   1. Carcinoma of upper-outer quadrant of left breast in female, estrogen receptor positive ([place])  C50.412    Z17.0     SUMMARY OF ONCOLOGIC HISTORY: Oncology History  Bilateral breast cancer ([place])  07/12/2019 Initial Diagnosis   Left axillary soft tissue excision: Invasive ductal carcinoma lymphoid tissue not present   08/02/2019 Breast MRI   Right breast enhancing linear mass 6 x 6 x 13 mm, left breast enhancing irregular mass 5 x 15 x 5 mm, no abnormal appearing lymph nodes   08/19/2019 Cancer Staging   Staging form: Breast, AJCC 8th Edition - Clinical stage from 08/19/2019: Stage IIA (cT3, cN0, cM0, G2, ER+, PR+, HER2-) - Signed by Nicholas Lose, MD on 08/19/2019   10/05/2019 Surgery   Right lumpectomy: 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 Left breast reexcision: IDC grade 2, 1.7 cm, intermediate grade DCIS, margins clear, 0/6 lymph nodes negative, ER 95%, PR 95%, HER-2 negative, Ki-67 15%, T3N0   10/14/2019 Cancer Staging   Staging form: Breast, AJCC 8th Edition - Pathologic stage from 10/14/2019: Stage IB (pT3, pN0(sn), cM0, G2, ER+, PR+, HER2-) - Signed by Nicholas Lose, MD on 10/14/2019   11/23/2019 -  Radiation Therapy   Adjuvant radiation   Carcinoma of upper-outer quadrant of left breast in female, estrogen receptor positive ([place])  08/25/2019 Initial Diagnosis   Carcinoma of upper-outer quadrant of left breast in female, estrogen receptor positive ([place])   08/25/2019 Cancer Staging   Staging form: Breast, AJCC 8th Edition - Clinical:  Stage IIA (cT3, cN0, cM0, G2, ER+, PR+, HER2-) - Signed by Eppie Gibson, MD on 08/25/2019   09/02/2019 Oncotype testing   Oncotype: score of 0 with a 3% chance of distant recurrence in 9 years with antiestrogen therapy alone.    10/05/2019 Surgery   Bilateral lumpectomies Ninfa Linden):  Right breast: intraductal papilloma and no evidence of malignancy  Left breast: IDC with DCIS, grade 2, 1.7cm, clear margins, 6 left axillary lymph nodes negative      CHIEF COMPLIANT: Follow-up of left breast cancer to discuss antiestrogen therapy  INTERVAL HISTORY: Connie Marshall is a 74 y.o. with above-mentioned history of left breast cancer for which she underwent bilateral lumpectomies and is currently on radiation therapy. She presents to the clinic today to discuss antiestrogen therapy.   ALLERGIES:  is allergic to tape.  MEDICATIONS:  Current Outpatient Medications  Medication Sig Dispense Refill  . fosinopril (MONOPRIL) 20 MG tablet Take 1 tablet (20 mg total) by mouth daily. 90 tablet 3  . furosemide (LASIX) 40 MG tablet 1 tab by mouth in the AM, and 1 in the PM as needed for leg swelling 180 tablet 3  . halobetasol (ULTRAVATE) 0.05 % ointment     . 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 simvastatin (ZOCOR) 20 MG tablet Take 1 tablet (20 mg total) by mouth every evening. 90 tablet 3  . traMADol (ULTRAM) 50 MG tablet Take 1 tablet (50 mg total) by mouth every 6 (six) hours as needed. 30 tablet 0  . VIT B12-METHIONINE-INOS-CHOL IM Inject into the muscle every 30 (thirty)  days.    . Vitamin D, Ergocalciferol, (DRISDOL) 1.25 MG (50000 UT) CAPS capsule Take 1 capsule (50,000 Units total) by mouth every 7 (seven) days. 12 capsule 0   No current facility-administered medications for this visit.    PHYSICAL EXAMINATION: ECOG PERFORMANCE STATUS: 1 - Symptomatic but completely ambulatory  Vitals:   12/21/19 1426  BP: (!) 150/76  Pulse: 66  Resp: 16  Temp: 98.7 F (37.1 C)    SpO2: 100%   Filed Weights   12/21/19 1426  Weight: 240 lb (108.9 kg)     LABORATORY DATA:  I have reviewed the data as listed CMP Latest Ref Rng & Units 09/30/2019 08/05/2019 07/08/2019  Glucose 70 - 99 mg/dL 120(H) 149(H) 145(H)  BUN 6 - 23 mg/dL '16 10 10  ' Creatinine 0.40 - 1.20 mg/dL 0.82 0.80 0.88  Sodium 135 - 145 mEq/L 138 139 139  Potassium 3.5 - 5.1 mEq/L 3.6 4.1 3.9  Chloride 96 - 112 mEq/L 105 104 105  CO2 19 - 32 mEq/L '25 25 24  ' Calcium 8.4 - 10.5 mg/dL 9.9 10.2 9.8  Total Protein 6.0 - 8.3 g/dL 7.2 7.3 -  Total Bilirubin 0.2 - 1.2 mg/dL 0.5 0.5 -  Alkaline Phos 39 - 117 U/L 61 62 -  AST 0 - 37 U/L 14 13 -  ALT 0 - 35 U/L 11 9 -    Lab Results  Component Value Date   WBC 4.0 09/30/2019   HGB 13.5 09/30/2019   HCT 39.8 09/30/2019   MCV 87.4 09/30/2019   PLT 208.0 09/30/2019   NEUTROABS 1.9 09/30/2019    ASSESSMENT & PLAN:  Carcinoma of upper-outer quadrant of left breast in female, estrogen receptor positive ([place]) 07/12/2019:Left axillary soft tissue excision: 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 Left breast reexcision: IDC grade 2, 1.7 cm, intermediate grade DCIS, margins clear, 0/6 lymph nodes negative, ER 95%, PR 95%, HER-2 negative, Ki-67 15%, T3N0 Stage Ib pathologic stage  Oncotype score: 0, distant recurrence at 9 years: 3% Adjuvant radiation: 11/23/2019-12/21/2019  Treatment plan: Adjuvant antiestrogen therapy with anastrozole 1 mg daily x5 years Anastrozole counseling: We discussed the risks and benefits of anti-estrogen therapy with aromatase inhibitors. These include but not limited to insomnia, hot flashes, mood changes, vaginal dryness, bone density loss, and weight  gain. We strongly believe that the benefits far outweigh the risks. Patient understands these risks and consented to starting treatment. Planned treatment duration is 5 years.  Return to clinic in 3 months for survivorship care plan visit    No orders of the defined types were placed in this encounter.  The patient has a good understanding of the overall plan. she agrees with it. she will call with any problems that may develop before the next visit here.  Total time spent: 30 mins including face to face time and time spent for planning, charting and coordination of care  Nicholas Lose, MD 12/21/2019  I, Cloyde Reams Dorshimer, am acting as scribe for Dr. Nicholas Lose.  I have reviewed the above documentation for accuracy and completeness, and I agree with the above.

## 2019-12-21 ENCOUNTER — Ambulatory Visit
Admission: RE | Admit: 2019-12-21 | Discharge: 2019-12-21 | Disposition: A | Discharge status: Home / Self Care | Payer: Medicare Other | Source: Ambulatory Visit | Attending: Radiation Oncology | Admitting: Radiation Oncology

## 2019-12-21 ENCOUNTER — Ambulatory Visit: Payer: Medicare Other

## 2019-12-21 ENCOUNTER — Other Ambulatory Visit: Payer: Self-pay

## 2019-12-21 ENCOUNTER — Inpatient Hospital Stay: Payer: Medicare Other | Attending: Hematology and Oncology | Admitting: Hematology and Oncology

## 2019-12-21 DIAGNOSIS — Z170 Estrogen receptor positive status [ER+]: Secondary | ICD-10-CM | POA: Diagnosis not present

## 2019-12-21 DIAGNOSIS — C50412 Malignant neoplasm of upper-outer quadrant of left female breast: Secondary | ICD-10-CM | POA: Diagnosis not present

## 2019-12-21 MED ORDER — ANASTROZOLE 1 MG PO TABS: 1.0000 mg | ORAL_TABLET | Freq: Every day | ORAL | 3 refills | Status: DC

## 2019-12-21 NOTE — Assessment & Plan Note (Signed)
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 Left breast reexcision: IDC grade 2, 1.7 cm, intermediate grade DCIS, margins clear, 0/6 lymph nodes negative, ER 95%, PR 95%, HER-2 negative, Ki-67 15%, T3N0 Stage Ib pathologic stage  Oncotype score: 0, distant recurrence at 9 years: 3% Adjuvant radiation: 11/23/2019-12/21/2019  Treatment plan: Adjuvant antiestrogen therapy with anastrozole 1 mg daily x5 years Anastrozole counseling: We discussed the risks and benefits of anti-estrogen therapy with aromatase inhibitors. These include but not limited to insomnia, hot flashes, mood changes, vaginal dryness, bone density loss, and weight gain. We strongly believe that the benefits far outweigh the risks. Patient understands these risks and consented to starting treatment. Planned treatment duration is 5 years.  Return to clinic in 3 months for survivorship care plan visit

## 2019-12-22 ENCOUNTER — Ambulatory Visit
Admission: RE | Admit: 2019-12-22 | Discharge: 2019-12-22 | Disposition: A | Discharge status: Home / Self Care | Payer: Medicare Other | Source: Ambulatory Visit | Attending: Radiation Oncology | Admitting: Radiation Oncology

## 2019-12-22 ENCOUNTER — Encounter: Payer: Self-pay | Admitting: Radiation Oncology

## 2019-12-22 ENCOUNTER — Encounter: Payer: Self-pay | Admitting: *Deleted

## 2019-12-22 ENCOUNTER — Other Ambulatory Visit: Payer: Self-pay

## 2019-12-22 DIAGNOSIS — C50412 Malignant neoplasm of upper-outer quadrant of left female breast: Secondary | ICD-10-CM | POA: Diagnosis not present

## 2019-12-24 ENCOUNTER — Telehealth: Payer: Self-pay | Admitting: Hematology and Oncology

## 2019-12-24 NOTE — Telephone Encounter (Signed)
Scheduled per 03/09 los, patient has been called and voicemail was left. ?

## 2019-12-28 ENCOUNTER — Other Ambulatory Visit: Payer: Self-pay

## 2019-12-28 ENCOUNTER — Encounter: Payer: Self-pay | Admitting: *Deleted

## 2020-01-04 ENCOUNTER — Encounter: Payer: Self-pay | Admitting: *Deleted

## 2020-01-26 NOTE — Progress Notes (Signed)
I called the patient today about her upcoming follow-up appointment in radiation oncology.   Given the state of the COVID-19 pandemic, concerning case numbers in our community, and guidance from [facility], I offered a phone assessment with the patient to determine if coming to the clinic was necessary. She accepted.  I let the patient know that I had spoken with Dr. Isidore Moos, and she wanted them to know the importance of washing their hands for at least 20 seconds at a time, especially after going out in public, and before they eat.  Limit going out in public whenever possible. Do not touch your face, unless your hands are clean, such as when bathing. Get plenty of rest, eat well, and stay hydrated. Patient verbalized understanding and agreement. She has received both COVID-19 vaccines, and tolerated them well.  The patient denies any symptomatic concerns.  Specifically, they report good healing of their skin in the radiation fields.  Skin is intact and per patient "improving every week. It's almost back to normal". She has no range of motion or lymphedema issues to the left arm, though the surgical site from her lumpectomy is still numb.  I recommended that she continue skin care by applying oil or lotion with vitamin E to the skin in the radiation fields, BID, for 2 more months.  Continue follow-up with medical oncology - follow-up is scheduled on 03/22/2020 with Dr. Nicholas Lose.  I explained that yearly mammograms are important for patients with intact breast tissue, and physical exams are important after mastectomy for patients that cannot undergo mammography.  I encouraged her to call if she had further questions or concerns about her healing. Otherwise, she will follow-up PRN in radiation oncology. Patient is pleased with this plan, and we will cancel her upcoming follow-up to reduce the risk of COVID-19 transmission.

## 2020-01-28 ENCOUNTER — Inpatient Hospital Stay
Admission: RE | Admit: 2020-01-28 | Discharge: 2020-01-28 | Disposition: A | Discharge status: Home / Self Care | Payer: Self-pay | Source: Ambulatory Visit | Attending: Radiation Oncology | Admitting: Radiation Oncology

## 2020-02-02 NOTE — Progress Notes (Signed)
  Patient Name: Connie Marshall MRN: [id number] DOB: 1946-02-12 Referring Physician: [name] (Profile Not Attached) Date of Service: 01/26/2020 [facility], [place]                                                        End Of Treatment Note  Diagnoses: C50.412-Malignant neoplasm of upper-outer quadrant of left female breast  Cancer Staging: Cancer Staging Bilateral breast cancer [facility]) Staging form: Breast, AJCC 8th Edition - Clinical stage from 08/19/2019: Stage IIA (cT3, cN0, cM0, G2, ER+, PR+, HER2-) - Signed by Nicholas Lose, MD on 08/19/2019 - Pathologic stage from 10/14/2019: Stage IB (pT3, pN0(sn), cM0, G2, ER+, PR+, HER2-) - Signed by Nicholas Lose, MD on 10/14/2019  Carcinoma of upper-outer quadrant of left breast in female, estrogen receptor positive ([place]) Staging form: Breast, AJCC 8th Edition - Clinical: Stage IIA (cT3, cN0, cM0, G2, ER+, PR+, HER2-) - Signed by Eppie Gibson, MD on 08/25/2019   Intent: Curative  Radiation Treatment Dates: 11/23/2019 through 12/22/2019  Site Technique Total Dose (Gy) Dose per Fx (Gy) Completed Fx Beam Energies  Breast, Left: Breast_Lt_Axilla 3D 42.56/42.56 2.66 16/16 6X, 15X  Breast, Left: Breast_Lt_Bst_Axilla 3D 10/10 2 5/5 6X, 15X   Narrative: The patient tolerated radiation therapy relatively well.   Plan: The patient will follow-up with radiation oncology in 1 mo, or as needed. -----------------------------------  Eppie Gibson, MD

## 2020-03-21 NOTE — Progress Notes (Signed)
Patient Care Team: [name], MD as PCP - General (Internal Medicine) Kathie Rhodes, MD (Urology) Dyke Maes, [place] (Optometry) Lafayette Dragon, MD (Inactive) (Gastroenterology) Thornell Sartorius, MD (Otolaryngology) Mauro Kaufmann, RN as Oncology Nurse Navigator Rockwell Germany, RN as Oncology Nurse Navigator  DIAGNOSIS:    ICD-10-CM   1. Carcinoma of upper-outer quadrant of left breast in female, estrogen receptor positive ([place])  C50.412    Z17.0     SUMMARY OF ONCOLOGIC HISTORY: Oncology History  Bilateral breast cancer ([place])  07/12/2019 Initial Diagnosis   Left axillary soft tissue excision: Invasive ductal carcinoma lymphoid tissue not present   08/02/2019 Breast MRI   Right breast enhancing linear mass 6 x 6 x 13 mm, left breast enhancing irregular mass 5 x 15 x 5 mm, no abnormal appearing lymph nodes   08/19/2019 Cancer Staging   Staging form: Breast, AJCC 8th Edition - Clinical stage from 08/19/2019: Stage IIA (cT3, cN0, cM0, G2, ER+, PR+, HER2-) - Signed by Nicholas Lose, MD on 08/19/2019   10/05/2019 Surgery   Right lumpectomy: 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 Left breast reexcision: IDC grade 2, 1.7 cm, intermediate grade DCIS, margins clear, 0/6 lymph nodes negative, ER 95%, PR 95%, HER-2 negative, Ki-67 15%, T3N0   10/14/2019 Cancer Staging   Staging form: Breast, AJCC 8th Edition - Pathologic stage from 10/14/2019: Stage IB (pT3, pN0(sn), cM0, G2, ER+, PR+, HER2-) - Signed by Nicholas Lose, MD on 10/14/2019   11/23/2019 - 12/22/2019 Radiation Therapy   Adjuvant radiation   12/21/2019 -  Anti-estrogen oral therapy   Anastrozole, 7m daily, planned duration 5 years   Carcinoma of upper-outer quadrant of left breast in female, estrogen receptor positive [place]  08/25/2019 Initial Diagnosis   Carcinoma of upper-outer quadrant of left breast in female, estrogen  receptor positive [place]   08/25/2019 Cancer Staging   Staging form: Breast, AJCC 8th Edition - Clinical: Stage IIA (cT3, cN0, cM0, G2, ER+, PR+, HER2-) - Signed by SEppie Gibson MD on 08/25/2019   09/02/2019 Oncotype testing   Oncotype: score of 0 with a 3% chance of distant recurrence in 9 years with antiestrogen therapy alone.    10/05/2019 Surgery   Bilateral lumpectomies ([facility] [place]:  Right breast: intraductal papilloma and no evidence of malignancy  Left breast: IDC with DCIS, grade 2, 1.7cm, clear margins, 6 left axillary lymph nodes negative      CHIEF COMPLIANT: Follow-up of left breast cancer to discuss antiestrogen therapy  INTERVAL HISTORY: LNayeli Calvertis a 74y.o. with above-mentioned history of left breast cancer treated with bilateral lumpectomies, radiation, and is currently on antiestrogen therapy with anastrozole. She presents to the clinic todayfor follow-up.    ALLERGIES:  is allergic to tape.  MEDICATIONS:  Current Outpatient Medications  Medication Sig Dispense Refill  . anastrozole (ARIMIDEX) 1 MG tablet Take 1 tablet (1 mg total) by mouth daily. 90 tablet 3  . fosinopril (MONOPRIL) 20 MG tablet Take 1 tablet (20 mg total) by mouth daily. 90 tablet 3  . furosemide (LASIX) 40 MG tablet 1 tab by mouth in the AM, and 1 in the PM as needed for leg swelling 180 tablet 3  . 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 simvastatin (ZOCOR) 20 MG tablet Take 1 tablet (20 mg total) by mouth every evening. 90 tablet 3  . traMADol (ULTRAM) 50 MG tablet Take 1 tablet (50  mg total) by mouth every 6 (six) hours as needed. 30 tablet 0   No current facility-administered medications for this visit.    PHYSICAL EXAMINATION: ECOG PERFORMANCE STATUS: 1 - Symptomatic but completely ambulatory  Vitals:   03/22/20 0958  BP: (!) 150/67  Pulse: 74  Resp: 14  Temp: 98.7 F (37.1 C)  SpO2: 98%   Filed Weights   03/22/20 0958  Weight: 239 lb 11.2 oz (108.7  kg)    LABORATORY DATA:  I have reviewed the data as listed CMP Latest Ref Rng & Units 09/30/2019 08/05/2019 07/08/2019  Glucose 70 - 99 mg/dL 120(H) 149(H) 145(H)  BUN 6 - 23 mg/dL '16 10 10  ' Creatinine 0.40 - 1.20 mg/dL 0.82 0.80 0.88  Sodium 135 - 145 mEq/L 138 139 139  Potassium 3.5 - 5.1 mEq/L 3.6 4.1 3.9  Chloride 96 - 112 mEq/L 105 104 105  CO2 19 - 32 mEq/L '25 25 24  ' Calcium 8.4 - 10.5 mg/dL 9.9 10.2 9.8  Total Protein 6.0 - 8.3 g/dL 7.2 7.3 -  Total Bilirubin 0.2 - 1.2 mg/dL 0.5 0.5 -  Alkaline Phos 39 - 117 U/L 61 62 -  AST 0 - 37 U/L 14 13 -  ALT 0 - 35 U/L 11 9 -    Lab Results  Component Value Date   WBC 4.0 09/30/2019   HGB 13.5 09/30/2019   HCT 39.8 09/30/2019   MCV 87.4 09/30/2019   PLT 208.0 09/30/2019   NEUTROABS 1.9 09/30/2019    ASSESSMENT & PLAN:  Carcinoma of upper-outer quadrant of left breast in female, estrogen receptor positive ([place]) 07/12/2019:Left axillary soft tissue excision: 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 Left breast reexcision: IDC grade 2, 1.7 cm, intermediate grade DCIS, margins clear, 0/6 lymph nodes negative, ER 95%, PR 95%, HER-2 negative, Ki-67 15%, T3N0 Stage Ib pathologic stage  Oncotype score: 0, distant recurrence at 9 years: 3% Adjuvant radiation: 11/23/2019-12/21/2019  Treatment plan: Adjuvant antiestrogen therapy with anastrozole 1 mg daily x5 years started march 2021 Anastrozole Toxicities: 1.  Diffuse muscle aches and stiffness: I instructed her to take turmeric and also she will take Advil for the aches and pains. 2.  Hot flashes mild to moderate  Breast Cancer Surveillance: Mammograms will be scheduled for October 2021.  Patient did  not want to get it done in September as I was recommending. She is still fairly sore from recent radiation.  Return to clinic in 1 year for follow-up   No orders of the defined types were placed in this encounter.  The patient has a good understanding of the overall plan. she agrees with it. she will call with any problems that may develop before the next visit here.  Total time spent: 30 mins including face to face time and time spent for planning, charting and coordination of care  Nicholas Lose, MD 03/22/2020  I, Cloyde Reams Dorshimer, am acting as scribe for Dr. Nicholas Lose.  I have reviewed the above documentation for accuracy and completeness, and I agree with the above.

## 2020-03-22 ENCOUNTER — Inpatient Hospital Stay: Payer: Medicare Other | Attending: Hematology and Oncology | Admitting: Hematology and Oncology

## 2020-03-22 ENCOUNTER — Other Ambulatory Visit: Payer: Self-pay

## 2020-03-22 DIAGNOSIS — Z923 Personal history of irradiation: Secondary | ICD-10-CM | POA: Insufficient documentation

## 2020-03-22 DIAGNOSIS — C50412 Malignant neoplasm of upper-outer quadrant of left female breast: Secondary | ICD-10-CM | POA: Diagnosis present

## 2020-03-22 DIAGNOSIS — Z170 Estrogen receptor positive status [ER+]: Secondary | ICD-10-CM

## 2020-03-22 NOTE — Assessment & Plan Note (Signed)
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 Left breast reexcision: IDC grade 2, 1.7 cm, intermediate grade DCIS, margins clear, 0/6 lymph nodes negative, ER 95%, PR 95%, HER-2 negative, Ki-67 15%, T3N0 Stage Ib pathologic stage  Oncotype score: 0, distant recurrence at 9 years: 3% Adjuvant radiation: 11/23/2019-12/21/2019  Treatment plan: Adjuvant antiestrogen therapy with anastrozole 1 mg daily x5 years started march 2021 Anastrozole Toxicities:  Breast Cancer Surveillance: Mammograms

## 2020-03-29 ENCOUNTER — Telehealth: Payer: Self-pay | Admitting: Hematology and Oncology

## 2020-03-29 NOTE — Telephone Encounter (Signed)
Scheduled per 06/09 los, patient has been called and it seems like there no voicemail set up. Calender will be mailed.

## 2020-05-16 ENCOUNTER — Ambulatory Visit (INDEPENDENT_AMBULATORY_CARE_PROVIDER_SITE_OTHER): Payer: Medicare Other | Admitting: Internal Medicine

## 2020-05-16 ENCOUNTER — Other Ambulatory Visit: Payer: Self-pay

## 2020-05-16 ENCOUNTER — Encounter: Payer: Self-pay | Admitting: Internal Medicine

## 2020-05-16 VITALS — BP 130/62 | HR 67 | Temp 98.0°F | Ht 69.0 in | Wt 239.0 lb

## 2020-05-16 DIAGNOSIS — E119 Type 2 diabetes mellitus without complications: Secondary | ICD-10-CM | POA: Diagnosis not present

## 2020-05-16 DIAGNOSIS — M25561 Pain in right knee: Secondary | ICD-10-CM | POA: Diagnosis not present

## 2020-05-16 DIAGNOSIS — I10 Essential (primary) hypertension: Secondary | ICD-10-CM | POA: Diagnosis not present

## 2020-05-16 DIAGNOSIS — R300 Dysuria: Secondary | ICD-10-CM

## 2020-05-16 DIAGNOSIS — G8929 Other chronic pain: Secondary | ICD-10-CM

## 2020-05-16 DIAGNOSIS — H9121 Sudden idiopathic hearing loss, right ear: Secondary | ICD-10-CM

## 2020-05-16 MED ORDER — CIPROFLOXACIN HCL 500 MG PO TABS: 500.0000 mg | ORAL_TABLET | Freq: Two times a day (BID) | ORAL | 0 refills | Status: AC

## 2020-05-16 NOTE — Progress Notes (Signed)
Subjective:    Patient ID: Connie Marshall, female    DOB: 03/13/1946, 74 y.o.   MRN: [id number]  HPI   Here to f/u; overall doing ok,  Pt denies chest pain, increasing sob or doe, wheezing, orthopnea, PND, increased LE swelling, palpitations, dizziness or syncope.  Pt denies new neurological symptoms such as new headache, or facial or extremity weakness or numbness.  Pt denies polydipsia, polyuria, or low sugar episode.  Pt states overall good compliance with meds, mostly trying to follow appropriate diet, with wt overall stable,  but little exercise however. Also, Denies urinary symptoms such as urgency, flank pain, hematuria or n/v, fever, chills but has had 3 days worsening urinary freq and dysuria.  Also c/o right knee stiffness worse in the AM, sometimes worries it may give away and fall, but no swelling or worsening pain.   Also co worsening bilateral right  > left hearing loss, suspects wax again as this is the usual cause and laments dr Ernesto Rutherford ent retirement Past Medical History:  Diagnosis Date  . Axillary mass, left   . Cancer ([place])   . Complication of anesthesia   . Diverticulosis   . Hemorrhoids   . Hyperlipidemia   . Hypertension   . IBS (irritable bowel syndrome)   . Obesity   . PONV (postoperative nausea and vomiting)   . Pre-diabetes    Past Surgical History:  Procedure Laterality Date  . BREAST LUMPECTOMY WITH RADIOACTIVE SEED AND SENTINEL LYMPH NODE BIOPSY Bilateral 10/05/2019   Procedure: BILATERAL BREAST LUMPECTOMY WITH BILATERAL RADIOACTIVE SEEDS AND LEFT SENTINEL LYMPH NODE BIOPSY;  Surgeon: Coralie Keens, MD;  Location: [place];  Service: General;  Laterality: Bilateral;  . CESAREAN SECTION     2 times  . CHOLECYSTECTOMY     laproscopic  . CHOLECYSTECTOMY, LAPAROSCOPIC    . kidney stone removed  2011  . MASS EXCISION Left 07/12/2019   Procedure: EXCISION LEFT AXILLARY MASS;  Surgeon: Coralie Keens, MD;  Location: [place];  Service: General;  Laterality: Left;  . RE-EXCISION OF BREAST LUMPECTOMY Left 10/05/2019   Procedure: RE-EXCISION OF LEFT BREAST CANCER;  Surgeon: Coralie Keens, MD;  Location: [place];  Service: General;  Laterality: Left;  . THYROGLOSSAL DUCT CYST  2000  . TONSILLECTOMY  5456  . umbilical herniorrhapy    . VAGINAL HYSTERECTOMY  2003    reports that she has never smoked. She has never used smokeless tobacco. She reports that she does not drink alcohol and does not use drugs. family history includes Diabetes in her father; Hypertension in her father and mother; Parkinson's disease in her sister; Prostate cancer in her brother. Allergies  Allergen Reactions  . Tape     ALLERGIC TO NON-ALLERGIC TAPE-causes a rash   Current Outpatient Medications on File Prior to Visit  Medication Sig Dispense Refill  . anastrozole (ARIMIDEX) 1 MG tablet Take 1 tablet (1 mg total) by mouth daily. 90 tablet 3  . fosinopril (MONOPRIL) 20 MG tablet Take 1 tablet (20 mg total) by mouth daily. 90 tablet 3  . furosemide (LASIX) 40 MG tablet 1 tab by mouth in the AM, and 1 in the PM as needed for leg swelling 180 tablet 3  . 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 simvastatin (ZOCOR) 20 MG tablet Take 1 tablet (20 mg total) by mouth every evening. 90 tablet 3  . traMADol (ULTRAM) 50 MG tablet Take  1 tablet (50 mg total) by mouth every 6 (six) hours as needed. 30 tablet 0   No current facility-administered medications on file prior to visit.   Review of Systems All otherwise neg per pt     Objective:   Physical Exam BP 130/62 (BP Location: Left Arm, Patient Position: Sitting, Cuff Size: Large)   Pulse 67   Temp 98 F (36.7 C) (Oral)   Ht 5\' 9"  (1.753 m)   Wt 239 lb (108.4 kg)   SpO2 97%   BMI 35.29 kg/m  VS noted,  Constitutional: Pt appears in NAD HENT: Head: NCAT.  Right Ear: External ear normal.  Left Ear: External ear normal.  Hearing  improved bilat with wax impaction irrigation Eyes: . Pupils are equal, round, and reactive to light. Conjunctivae and EOM are normal Nose: without d/c or deformity Neck: Neck supple. Gross normal ROM Cardiovascular: Normal rate and regular rhythm.   Pulmonary/Chest: Effort normal and breath sounds without rales or wheezing.  Abd:  Soft, NT, ND, + BS, no organomegaly Neurological: Pt is alert. At baseline orientation, motor grossly intact Skin: Skin is warm. No rashes, other new lesions, no LE edema Psychiatric: Pt behavior is normal without agitation  All otherwise neg per pt Lab Results  Component Value Date   WBC 4.0 09/30/2019   HGB 13.5 09/30/2019   HCT 39.8 09/30/2019   PLT 208.0 09/30/2019   GLUCOSE 120 (H) 09/30/2019   CHOL 126 09/30/2019   TRIG 94.0 09/30/2019   HDL 38.40 (L) 09/30/2019   LDLDIRECT 115.2 09/26/2008   LDLCALC 69 09/30/2019   ALT 11 09/30/2019   AST 14 09/30/2019   NA 138 09/30/2019   K 3.6 09/30/2019   CL 105 09/30/2019   CREATININE 0.82 09/30/2019   BUN 16 09/30/2019   CO2 25 09/30/2019   TSH 1.68 09/30/2019   HGBA1C 6.3 09/30/2019   MICROALBUR <0.7 09/30/2019      Assessment & Plan:

## 2020-05-16 NOTE — Patient Instructions (Signed)
Please take all new medication as prescribed - the antibiotic  Please consider making appt for the right knee and leg with sports medicine on the first floor  Your ears were cleared of wax impactions today  Please continue all other medications as before, and refills have been done if requested.  Please have the pharmacy call with any other refills you may need.  Please continue your efforts at being more active, low cholesterol diet, and weight control.  Please keep your appointments with your specialists as you may have planned  Please make an Appointment to return in 6 months, or sooner if needed

## 2020-05-19 ENCOUNTER — Encounter: Payer: Self-pay | Admitting: Internal Medicine

## 2020-05-19 LAB — URINALYSIS, ROUTINE W REFLEX MICROSCOPIC
Bilirubin Urine: NEGATIVE
Glucose, UA: NEGATIVE
Ketones, ur: NEGATIVE
Nitrite: POSITIVE — AB
Specific Gravity, Urine: 1.023 (ref 1.001–1.03)
WBC, UA: >=60 /HPF — AB (ref 0–5)
pH: <=5 (ref 5.0–8.0)

## 2020-05-19 LAB — URINE CULTURE

## 2020-05-22 ENCOUNTER — Ambulatory Visit (INDEPENDENT_AMBULATORY_CARE_PROVIDER_SITE_OTHER): Payer: Medicare Other | Admitting: Family Medicine

## 2020-05-22 ENCOUNTER — Encounter: Payer: Self-pay | Admitting: Family Medicine

## 2020-05-22 ENCOUNTER — Other Ambulatory Visit: Payer: Self-pay

## 2020-05-22 VITALS — BP 140/74 | HR 77 | Ht 69.0 in | Wt 245.6 lb

## 2020-05-22 DIAGNOSIS — M7061 Trochanteric bursitis, right hip: Secondary | ICD-10-CM

## 2020-05-22 DIAGNOSIS — R29898 Other symptoms and signs involving the musculoskeletal system: Secondary | ICD-10-CM

## 2020-05-22 DIAGNOSIS — M7541 Impingement syndrome of right shoulder: Secondary | ICD-10-CM

## 2020-05-22 NOTE — Progress Notes (Signed)
    Subjective:    CC: R thigh pain  I, Molly Weber, LAT, ATC, am serving as scribe for Dr. Lynne Leader.  HPI: Pt is a 74 y/o female presenting w/ c/o R thigh pain.  She locates her pain to her R thigh.  Aggravating factors: feels it at night while sleeping and first thing in the morning when she first gets out of bed walking; first thing after getting up from sitting for a while Treatments tired: Extra strength Tylenol; Biofreeze; BenGay  R arm pain: She locates her pain to her R upper arm.  Intermittent pain. -R shoulder mechanical symptoms: No -Aggravating factors: Nothing specific noted -Treatments tried: Tylenol and Biofreeze  She does in general these problems are both improved when she is exercising worse when she is not.  She is not been able to exercise recently because of the increase in COVID-19.   Pertinent review of Systems: No fevers or chills  Relevant historical information: Hypertension, diabetes   Objective:    Vitals:   05/22/20 1057  BP: 140/74  Pulse: 77  SpO2: 97%   General: Well Developed, well nourished, and in no acute distress.   MSK:  Right shoulder normal-appearing normal motion.  Nontender. Decrease strength abduction external/internal rotation 4/5. Negative Hawkins and Neer's test.  Negative empty can test. Negative Hawkins and Neer's test.  Contralateral left shoulder normal-appearing nontender normal motion also decrease strength 4/5 to abduction external rotation.  Negative impingement and biceps tendinitis testing.  Right hip normal-appearing normal motion.  Not particularly tender to palpation. Hip abduction and external rotation strength diminished 4/5.  Left hip normal-appearing normal motion. Nontender. Hip external rotation strength intact 5/5 however abduction diminished 4/5    Impression and Recommendations:    Assessment and Plan: 74 y.o. female with right shoulder pain/upper arm pain.  Likely due to rotator cuff  tendinopathy.  Patient has generalized weakness of her shoulders bilaterally.  Physical therapy should be very effective in improving this.  Recheck back in 4 to 6 weeks.  Right lateral hip pain/thigh pain.  Again due to the generalized hip abductor weakness and trochanteric bursitis.  Plan to treat with physical therapy and again check back in 4 to 6 weeks.  Consider general metabolic/rheumatologic work-up if not improved. Orders Placed This Encounter  Procedures  . Ambulatory referral to Physical Therapy    Referral Priority:   Routine    Referral Type:   Physical Medicine    Referral Reason:   Specialty Services Required    Requested Specialty:   Physical Therapy   No orders of the defined types were placed in this encounter.   Discussed warning signs or symptoms. Please see discharge instructions. Patient expresses understanding.   The above documentation has been reviewed and is accurate and complete Lynne Leader, M.D.

## 2020-05-22 NOTE — Patient Instructions (Signed)
Thank you for coming in today. Plan for physical therapy and home exercises.  Recheck with me in about 6-8 weeks.  Let me know sooner if this is not working.   Tylenol and biofreeze are safe and can be helpful.

## 2020-05-28 ENCOUNTER — Encounter: Payer: Self-pay | Admitting: Internal Medicine

## 2020-05-28 NOTE — Assessment & Plan Note (Signed)
Urged pt to f/u with sports medicine for possible cortisone

## 2020-05-28 NOTE — Assessment & Plan Note (Addendum)
Possible uti, for empiric antibx after urine studies,  to f/u any worsening symptoms or concerns  I spent 31 minutes in preparing to see the patient by review of recent labs, imaging and procedures, obtaining and reviewing separately obtained history, communicating with the patient and family or caregiver, ordering medications, tests or procedures, and documenting clinical information in the EHR including the differential Dx, treatment, and any further evaluation and other management of dysuria, hearing loss, right knee and leg pain, dm, htn,

## 2020-05-28 NOTE — Assessment & Plan Note (Signed)
stable overall by history and exam, recent data reviewed with pt, and pt to continue medical treatment as before,  to f/u any worsening symptoms or concerns  

## 2020-05-28 NOTE — Assessment & Plan Note (Signed)
Resolved with irrigation. 

## 2020-05-31 LAB — HM DIABETES EYE EXAM

## 2020-06-05 ENCOUNTER — Ambulatory Visit: Payer: Medicare Other | Admitting: Rehabilitative and Restorative Service Providers"

## 2020-06-09 ENCOUNTER — Ambulatory Visit (INDEPENDENT_AMBULATORY_CARE_PROVIDER_SITE_OTHER): Payer: Medicare Other | Admitting: Physical Therapy

## 2020-06-09 ENCOUNTER — Other Ambulatory Visit: Payer: Self-pay

## 2020-06-09 ENCOUNTER — Encounter: Payer: Self-pay | Admitting: Physical Therapy

## 2020-06-09 DIAGNOSIS — M6281 Muscle weakness (generalized): Secondary | ICD-10-CM | POA: Diagnosis not present

## 2020-06-09 DIAGNOSIS — M25511 Pain in right shoulder: Secondary | ICD-10-CM | POA: Diagnosis not present

## 2020-06-09 DIAGNOSIS — G8929 Other chronic pain: Secondary | ICD-10-CM

## 2020-06-09 DIAGNOSIS — R2689 Other abnormalities of gait and mobility: Secondary | ICD-10-CM

## 2020-06-09 DIAGNOSIS — M25551 Pain in right hip: Secondary | ICD-10-CM

## 2020-06-09 NOTE — Therapy (Signed)
[facility] Physical Therapy [address] [place], [place], [address] Phone: [phone]   Fax:  [phone]  Physical Therapy Evaluation  Patient Details  Name: Connie Marshall MRN: [id number] Date of Birth: 1946-04-15 Referring Provider (PT): Dr. Lynne Leader   Encounter Date: 06/09/2020   PT End of Session - 06/09/20 1023    Visit Number 1    Number of Visits 15    Date for PT Re-Evaluation 08/04/20    Authorization Type [place] MCR    PT Start Time 0930    PT Stop Time 1012    PT Time Calculation (min) 42 min    Activity Tolerance Patient tolerated treatment well    Behavior During Therapy [facility] for tasks assessed/performed           Past Medical History:  Diagnosis Date  . Axillary mass, left   . Cancer ([place])   . Complication of anesthesia   . Diverticulosis   . Hemorrhoids   . Hyperlipidemia   . Hypertension   . IBS (irritable bowel syndrome)   . Obesity   . PONV (postoperative nausea and vomiting)   . Pre-diabetes     Past Surgical History:  Procedure Laterality Date  . BREAST LUMPECTOMY WITH RADIOACTIVE SEED AND SENTINEL LYMPH NODE BIOPSY Bilateral 10/05/2019   Procedure: BILATERAL BREAST LUMPECTOMY WITH BILATERAL RADIOACTIVE SEEDS AND LEFT SENTINEL LYMPH NODE BIOPSY;  Surgeon: Coralie Keens, MD;  Location: [place];  Service: General;  Laterality: Bilateral;  . CESAREAN SECTION     2 times  . CHOLECYSTECTOMY     laproscopic  . CHOLECYSTECTOMY, LAPAROSCOPIC    . kidney stone removed  2011  . MASS EXCISION Left 07/12/2019   Procedure: EXCISION LEFT AXILLARY MASS;  Surgeon: Coralie Keens, MD;  [place];  Service: General;  Laterality: Left;  . RE-EXCISION OF BREAST LUMPECTOMY Left 10/05/2019   Procedure: RE-EXCISION OF LEFT BREAST CANCER;  Surgeon: Coralie Keens, MD;  Location: [place];  Service: General;  Laterality: Left;  . THYROGLOSSAL DUCT CYST  2000  . TONSILLECTOMY   4709  . umbilical herniorrhapy    . VAGINAL HYSTERECTOMY  2003    There were no vitals filed for this visit.    Subjective Assessment - 06/09/20 0935    Subjective Relays Rt hip pain and weakness is her biggest complaint. This has been going on for over a year. She was also having Rt shoulder pain for a year but says this is no longer bothering her. She has breast Ca surgery 09/2019 and she feels like this has zapped her energy and she feels tired, weak, and off balance.    Pertinent History PMH: breast CA with lumpectomy 09/2019,IBS,HTN,    How long can you stand comfortably? depends    How long can you walk comfortably? depends    Patient Stated Goals strengthen her hip    Currently in Pain? Yes    Pain Score 8     Pain Location Hip    Pain Orientation Right    Pain Descriptors / Indicators Aching;Tightness    Pain Type Chronic pain    Pain Radiating Towards lateral hip to her knee    Pain Onset More than a month ago    Pain Frequency Intermittent    Aggravating Factors  getting up from bed or chair, prolonged walking or standing    Pain Relieving Factors tylenol    Multiple Pain Sites No  [facility] PT Assessment - 06/09/20 0001      Assessment   Medical Diagnosis Rt hip pain, trochanteric bursiitis, impingement/RTC tendonopathy Rt shoulder    Referring Provider (PT) Dr. Lynne Leader    Onset Date/Surgical Date --   chronic pain for last year   Hand Dominance Right    Next MD Visit PRN    Prior Therapy nothing recent      Precautions   Precautions None      Restrictions   Weight Bearing Restrictions No      Balance Screen   Has the patient fallen in the past 6 months No   but is concerned about balance and falling   Has the patient had a decrease in activity level because of a fear of falling?  No    Is the patient reluctant to leave their home because of a fear of falling?  No      Home Ecologist residence    Additional  Comments no steps or stairs, now has walk in tub      Prior Function   Level of Independence Independent    Vocation Retired    Leisure being with grandkids      Cognition   Overall Cognitive Status Within Functional Limits for tasks assessed      ROM / Strength   AROM / PROM / Strength Strength      Strength   Overall Strength Comments all strength tested in sitting, good grip strength bilat    Strength Assessment Site Shoulder;Hip;Knee    Right/Left Shoulder Right    Right Shoulder Flexion 4/5    Right Shoulder ABduction 4/5    Right Shoulder Internal Rotation 5/5    Right Shoulder External Rotation 3+/5    Right/Left Hip Right;Left    Right Hip Flexion 4/5    Right Hip ABduction 4/5    Left Hip Flexion 4+/5    Left Hip ABduction 4+/5    Right/Left Knee Right;Left    Right Knee Flexion 5/5    Right Knee Extension 4/5    Left Knee Flexion 5/5    Left Knee Extension 4+/5      Flexibility   Soft Tissue Assessment /Muscle Length --   Tight hip rotators, tight IT band     Palpation   Palpation comment TTP over lateral hip, greater trochanter      Ambulation/Gait   Gait Comments independent with gait but balance deficits noted with wider BOS, [place] gait noted, slow overall velocity      Standardized Balance Assessment   Standardized Balance Assessment Timed Up and Go Test;Five Times Sit to Stand    Five times sit to stand comments  20.2   needs UE support to push up     Timed Up and Go Test   Manual TUG (seconds) 16.9                      Objective measurements completed on examination: See above findings.       [facility] Adult PT Treatment/Exercise - 06/09/20 0001      Exercises   Exercises Knee/Hip      Knee/Hip Exercises: Aerobic   Recumbent Bike 5 min L1    Nustep 5 min UE/LE L5                  PT Education - 06/09/20 1023    Education Details HEP, POC    Person(s) Educated  Patient    Methods Explanation;Demonstration;Verbal  cues;Handout    Comprehension Verbalized understanding;Need further instruction            PT Short Term Goals - 06/09/20 1034      PT SHORT TERM GOAL #1   Title Pt will be Ind and compliant with initial HEP.    Time 4    Period Weeks    Status New    Target Date 07/07/20      PT SHORT TERM GOAL #2   Title Pt will reduce pain overall 25% in Rt hip.    Status New             PT Long Term Goals - 06/09/20 1034      PT LONG TERM GOAL #1   Title Pt will reduce pain overall 50% in Rt hip and Rt shoulder    Time 8    Period Weeks    Status New    Target Date 08/04/20      PT LONG TERM GOAL #2   Title Pt will improve overall Rt hip strength to 4+/5 and Rt knee strength to 5/5 tested in sitting    Time 8    Period Weeks    Status New      PT LONG TERM GOAL #3   Title Pt will improve TUG to less than 14 seconds    Time 8    Period Weeks    Status New      PT LONG TERM GOAL #4   Title Pt will improve 5TSTS to less than 15 seconds    Status New                  Plan - 06/09/20 1024    Clinical Impression Statement Pt presents with chronic Rt hip pain consistent with tendonitis, OA pain, and bursitis. She also has Rt shoulder pain consistent with RTC tendonitis and impingment. Her Rt shoulder has not been bothering her so will focus more on Rt hip at this time. She has overall decreased ROM, decreased strength, decreased balance, and decreased activity tolerance for standing and walking. She will benefit from skilled PT to address these functional deficits.    Personal Factors and Comorbidities Comorbidity 3+    Comorbidities PMH: breast CA with lumpectomy 09/2019,IBS,HTN,    Examination-Activity Limitations Carry;Squat;Stairs;Stand;Lift;Locomotion Level;Reach Overhead;Transfers    Examination-Participation Restrictions Church;Cleaning;Community Activity;Driving;Laundry;Shop    Stability/Clinical Decision Making Evolving/Moderate complexity    Clinical Decision  Making Moderate    Rehab Potential Good    PT Frequency 2x / week   1-2   PT Duration 8 weeks    PT Treatment/Interventions ADLs/Self Care Home Management;Cryotherapy;Electrical Stimulation;Iontophoresis 4mg /ml Dexamethasone;Moist Heat;Ultrasound;Traction;Gait training;Stair training;Therapeutic activities;Therapeutic exercise;Balance training;Neuromuscular re-education;Manual techniques;Passive range of motion;Dry needling;Joint Manipulations;Taping;Vasopneumatic Device    PT Next Visit Plan review and update HEP PRN, focus more on Rt LE strength as this is her biggest complaint but if she starts complaining about Rt shoulder add in execises for this    PT Home Exercise Plan Access Code: [email or web address]    Consulted and Agree with Plan of Care Patient           Patient will benefit from skilled therapeutic intervention in order to improve the following deficits and impairments:  Abnormal gait, Decreased activity tolerance, Decreased balance, Decreased mobility, Decreased endurance, Decreased range of motion, Decreased strength, Difficulty walking, Hypomobility, Impaired flexibility, Increased muscle spasms, Improper body mechanics, Postural dysfunction, Obesity, Pain  Visit Diagnosis: Pain in right  hip  Muscle weakness (generalized)  Other abnormalities of gait and mobility  Chronic right shoulder pain     Problem List Patient Active Problem List   Diagnosis Date Noted  . Dysuria 05/16/2020  . Right knee pain 05/16/2020  . Carcinoma of upper-outer quadrant of left breast in female, estrogen receptor positive ([place]) 08/25/2019  . Bilateral breast cancer ([place]) 08/17/2019  . Skin nodule 05/11/2019  . B12 deficiency 05/11/2019  . Vitamin D deficiency 05/11/2019  . Acute lymphadenitis 07/16/2017  . Eustachian tube dysfunction, bilateral 07/16/2017  . 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 Sudden right hearing loss 03/25/2017  . Left otitis media 03/25/2017  . Rash 11/07/2015   . Abnormal mammogram 09/13/2015  . Blister of toe of left foot 04/13/2015  . Breast discharge 04/13/2015  . Wellness examination 08/06/2011  . Microscopic hematuria 04/27/2010  . ANXIETY 03/25/2009  . PARESTHESIA 03/23/2009  . PERIPHERAL EDEMA 03/23/2009  . HEMORRHOIDS, INTERNAL 02/13/2008  . DIVERTICULOSIS, COLON 02/13/2008  . IBS 02/13/2008  . CHOLECYSTECTOMY, LAPAROSCOPIC, HX OF 02/13/2008  . OBESITY NOS 07/07/2007  . Diabetes ([place]) 07/06/2007  . Hyperlipidemia 07/06/2007  . Essential hypertension 07/06/2007    Silvestre Mesi 06/09/2020, 10:40 AM  [facility] Physical Therapy [address] [place], [place], [address] Phone: [phone]   Fax:  [phone]  Name: Connie Marshall MRN: [id number] Date of Birth: 28-Aug-1946

## 2020-06-09 NOTE — Addendum Note (Signed)
Addended by: Debbe Odea on: 06/09/2020 10:44 AM   Modules accepted: Orders

## 2020-06-09 NOTE — Patient Instructions (Signed)
Access Code: [email or web address] URL: [email or web address] Date: 06/09/2020 Prepared by: Elsie Ra  Exercises Seated Piriformis Stretch - 2 x daily - 6 x weekly - 1 sets - 2-3 reps - 30 hold Supine ITB Stretch with Strap - 2 x daily - 6 x weekly - 2-3 reps - 30 hold Supine Bridge - 2 x daily - 6 x weekly - 10 reps - 1-2 sets - 5 hold Clamshell with Resistance - 2 x daily - 6 x weekly - 2-3 sets - 10 reps Sit to Stand with Armchair - 2 x daily - 6 x weekly - 3-4 sets - 5 reps Side Stepping with Resistance at Ankles and Counter Support - 2 x daily - 6 x weekly - 3 sets - 10 reps

## 2020-06-12 ENCOUNTER — Ambulatory Visit (INDEPENDENT_AMBULATORY_CARE_PROVIDER_SITE_OTHER): Payer: Medicare Other | Admitting: Physical Therapy

## 2020-06-12 ENCOUNTER — Other Ambulatory Visit: Payer: Self-pay

## 2020-06-12 DIAGNOSIS — M25511 Pain in right shoulder: Secondary | ICD-10-CM

## 2020-06-12 DIAGNOSIS — M25551 Pain in right hip: Secondary | ICD-10-CM | POA: Diagnosis not present

## 2020-06-12 DIAGNOSIS — R2689 Other abnormalities of gait and mobility: Secondary | ICD-10-CM

## 2020-06-12 DIAGNOSIS — M6281 Muscle weakness (generalized): Secondary | ICD-10-CM | POA: Diagnosis not present

## 2020-06-12 DIAGNOSIS — G8929 Other chronic pain: Secondary | ICD-10-CM

## 2020-06-12 NOTE — Therapy (Signed)
[facility] Physical Therapy [address] [place], [place], [address] Phone: [phone]   Fax:  [phone]  Physical Therapy Treatment  Patient Details  Name: Connie Marshall MRN: [id number] Date of Birth: 1946/07/01 Referring Provider (PT): Dr. Lynne Leader   Encounter Date: 06/12/2020   PT End of Session - 06/12/20 0924    Visit Number 2    Number of Visits 15    Date for PT Re-Evaluation 08/04/20    Authorization Type [employer] MCR    Progress Note Due on Visit 10    PT Start Time 0845    PT Stop Time 0930    PT Time Calculation (min) 45 min    Activity Tolerance Patient tolerated treatment well    Behavior During Therapy [facility] for tasks assessed/performed           Past Medical History:  Diagnosis Date   Axillary mass, left    Cancer ([facility])    Complication of anesthesia    Diverticulosis    Hemorrhoids    Hyperlipidemia    Hypertension    IBS (irritable bowel syndrome)    Obesity    PONV (postoperative nausea and vomiting)    Pre-diabetes     Past Surgical History:  Procedure Laterality Date   BREAST LUMPECTOMY WITH RADIOACTIVE SEED AND SENTINEL LYMPH NODE BIOPSY Bilateral 10/05/2019   Procedure: BILATERAL BREAST LUMPECTOMY WITH BILATERAL RADIOACTIVE SEEDS AND LEFT SENTINEL LYMPH NODE BIOPSY;  Surgeon: Coralie Keens, MD;  Location: [place];  Service: General;  Laterality: Bilateral;   CESAREAN SECTION     2 times   CHOLECYSTECTOMY     laproscopic   CHOLECYSTECTOMY, LAPAROSCOPIC     kidney stone removed  2011   MASS EXCISION Left 07/12/2019   Procedure: EXCISION LEFT AXILLARY MASS;  Surgeon: Coralie Keens, MD;  Location: [place];  Service: General;  Laterality: Left;   RE-EXCISION OF BREAST LUMPECTOMY Left 10/05/2019   Procedure: RE-EXCISION OF LEFT BREAST CANCER;  Surgeon: Coralie Keens, MD;  Location: [place];  Service: General;  Laterality: Left;   THYROGLOSSAL  DUCT CYST  2000   TONSILLECTOMY  6967   umbilical herniorrhapy     VAGINAL HYSTERECTOMY  2003    There were no vitals filed for this visit.   Subjective Assessment - 06/12/20 0923    Subjective Relays her Rt hip is hurting but does not provide number of pain intensity, the exercises are hard and somethimes hurt. She was reminded to keep them gentle and perform in smaller ROM that is not into pain. She says her Rt shoulder feels good today.    Pertinent History PMH: breast CA with lumpectomy 09/2019,IBS,HTN,    How long can you stand comfortably? depends    How long can you walk comfortably? depends    Patient Stated Goals strengthen her hip    Pain Onset More than a month ago            [facility] Adult PT Treatment/Exercise - 06/12/20 0001      Knee/Hip Exercises: Stretches   Active Hamstring Stretch Both;2 reps;30 seconds    Active Hamstring Stretch Limitations seated    ITB Stretch Right;2 reps;30 seconds    ITB Stretch Limitations supine with strap, muti cues not to go into pain    Piriformis Stretch Right;2 reps;30 seconds    Piriformis Stretch Limitations seated      Knee/Hip Exercises: Aerobic   Nustep 8 min UE/LE L5  Knee/Hip Exercises: Supine   Hip Adduction Isometric 15 reps    Hip Adduction Isometric Limitations holding 5 sec    Bridges 2 sets;10 reps    Other Supine Knee/Hip Exercises supine clam green 2X15, supine marches green 2X10 bilat      Modalities   Modalities Moist Heat      Moist Heat Therapy   Number Minutes Moist Heat 7 Minutes    Moist Heat Location Hip      Manual Therapy   Manual therapy comments Rt lateral hip IASTM with percussive gun 8 min, positioned in Lt sidelying                    PT Short Term Goals - 06/09/20 1034      PT SHORT TERM GOAL #1   Title Pt will be Ind and compliant with initial HEP.    Time 4    Period Weeks    Status New    Target Date 07/07/20      PT SHORT TERM GOAL #2   Title Pt will reduce  pain overall 25% in Rt hip.    Status New             PT Long Term Goals - 06/09/20 1034      PT LONG TERM GOAL #1   Title Pt will reduce pain overall 50% in Rt hip and Rt shoulder    Time 8    Period Weeks    Status New    Target Date 08/04/20      PT LONG TERM GOAL #2   Title Pt will improve overall Rt hip strength to 4+/5 and Rt knee strength to 5/5 tested in sitting    Time 8    Period Weeks    Status New      PT LONG TERM GOAL #3   Title Pt will improve TUG to less than 14 seconds    Time 8    Period Weeks    Status New      PT LONG TERM GOAL #4   Title Pt will improve 5TSTS to less than 15 seconds    Status New                 Plan - 06/12/20 2725    Clinical Impression Statement Her Rt shoulder is no longer bothering her so focused on her Rt hip today with gentle stretching, exercise and manual therapy. She does need cuing to keep in pain free ROM and not to push into pain. PT will continue to progress gently as tolerated.    Personal Factors and Comorbidities Comorbidity 3+    Comorbidities PMH: breast CA with lumpectomy 09/2019,IBS,HTN,    Examination-Activity Limitations Carry;Squat;Stairs;Stand;Lift;Locomotion Level;Reach Overhead;Transfers    Examination-Participation Restrictions Church;Cleaning;Community Activity;Driving;Laundry;Shop    Stability/Clinical Decision Making Evolving/Moderate complexity    Rehab Potential Good    PT Frequency 2x / week   1-2   PT Duration 8 weeks    PT Treatment/Interventions ADLs/Self Care Home Management;Cryotherapy;Electrical Stimulation;Iontophoresis 4mg /ml Dexamethasone;Moist Heat;Ultrasound;Traction;Gait training;Stair training;Therapeutic activities;Therapeutic exercise;Balance training;Neuromuscular re-education;Manual techniques;Passive range of motion;Dry needling;Joint Manipulations;Taping;Vasopneumatic Device    PT Next Visit Plan how was Manual with percussive gun last time?  focus more on Rt LE strength as  this is her biggest complaint but if she starts complaining about Rt shoulder add in execises for this    PT Home Exercise Plan Access Code: [email or web address]    Consulted and Agree with Plan of Care Patient  Patient will benefit from skilled therapeutic intervention in order to improve the following deficits and impairments:  Abnormal gait, Decreased activity tolerance, Decreased balance, Decreased mobility, Decreased endurance, Decreased range of motion, Decreased strength, Difficulty walking, Hypomobility, Impaired flexibility, Increased muscle spasms, Improper body mechanics, Postural dysfunction, Obesity, Pain  Visit Diagnosis: Pain in right hip  Muscle weakness (generalized)  Other abnormalities of gait and mobility  Chronic right shoulder pain     Problem List Patient Active Problem List   Diagnosis Date Noted   Dysuria 05/16/2020   Right knee pain 05/16/2020   Carcinoma of upper-outer quadrant of left breast in female, estrogen receptor positive ([place]) 08/25/2019   Bilateral breast cancer ([place]) 08/17/2019   Skin nodule 05/11/2019   B12 deficiency 05/11/2019   Vitamin D deficiency 05/11/2019   Acute lymphadenitis 07/16/2017   Eustachian tube dysfunction, bilateral 07/16/2017   Prolapse of female bladder, acquired 03/25/2017   Sudden right hearing loss 03/25/2017   Left otitis media 03/25/2017   Rash 11/07/2015   Abnormal mammogram 09/13/2015   Blister of toe of left foot 04/13/2015   Breast discharge 04/13/2015   Wellness examination 08/06/2011   Microscopic hematuria 04/27/2010   ANXIETY 03/25/2009   PARESTHESIA 03/23/2009   PERIPHERAL EDEMA 03/23/2009   HEMORRHOIDS, INTERNAL 02/13/2008   DIVERTICULOSIS, COLON 02/13/2008   IBS 02/13/2008   CHOLECYSTECTOMY, LAPAROSCOPIC, HX OF 02/13/2008   OBESITY NOS 07/07/2007   Diabetes ([place]) 07/06/2007   Hyperlipidemia 07/06/2007   Essential hypertension 07/06/2007    Silvestre Mesi 06/12/2020, 9:27 AM  [facility] Physical Therapy [address] [place], [place], [address] Phone: [phone]   Fax:  [phone]  Name: Connie Marshall MRN: [id number] Date of Birth: [birth year]

## 2020-06-26 ENCOUNTER — Encounter: Payer: Self-pay | Admitting: Internal Medicine

## 2020-06-27 ENCOUNTER — Other Ambulatory Visit: Payer: Self-pay

## 2020-06-27 ENCOUNTER — Ambulatory Visit (INDEPENDENT_AMBULATORY_CARE_PROVIDER_SITE_OTHER): Payer: Medicare Other | Admitting: Physical Therapy

## 2020-06-27 DIAGNOSIS — G8929 Other chronic pain: Secondary | ICD-10-CM

## 2020-06-27 DIAGNOSIS — M6281 Muscle weakness (generalized): Secondary | ICD-10-CM

## 2020-06-27 DIAGNOSIS — R2689 Other abnormalities of gait and mobility: Secondary | ICD-10-CM | POA: Diagnosis not present

## 2020-06-27 DIAGNOSIS — M25511 Pain in right shoulder: Secondary | ICD-10-CM

## 2020-06-27 DIAGNOSIS — M25551 Pain in right hip: Secondary | ICD-10-CM | POA: Diagnosis not present

## 2020-06-27 NOTE — Therapy (Signed)
[facility] [address] [place], [place], [address] Phone: [phone]   Fax:  [phone]  Physical Therapy Treatment  Patient Details  Name: Connie Marshall MRN: [id number] Date of Birth: [birth year] Referring Provider (PT): Dr. Lynne Leader   Encounter Date: 06/27/2020   PT End of Session - 06/27/20 1422    Visit Number 3    Number of Visits 15    Date for PT Re-Evaluation 08/04/20    Authorization Type UHC MCR    Progress Note Due on Visit 10    PT Start Time 1345    PT Stop Time 1430    PT Time Calculation (min) 45 min    Activity Tolerance Patient tolerated treatment well    Behavior During Therapy [facility] for tasks assessed/performed           Past Medical History:  Diagnosis Date  . Axillary mass, left   . Cancer ([place])   . Complication of anesthesia   . Diverticulosis   . Hemorrhoids   . Hyperlipidemia   . Hypertension   . IBS (irritable bowel syndrome)   . Obesity   . PONV (postoperative nausea and vomiting)   . Pre-diabetes     Past Surgical History:  Procedure Laterality Date  . BREAST LUMPECTOMY WITH RADIOACTIVE SEED AND SENTINEL LYMPH NODE BIOPSY Bilateral 10/05/2019   Procedure: BILATERAL BREAST LUMPECTOMY WITH BILATERAL RADIOACTIVE SEEDS AND LEFT SENTINEL LYMPH NODE BIOPSY;  Surgeon: Coralie Keens, MD;  Location: [place];  Service: General;  Laterality: Bilateral;  . CESAREAN SECTION     2 times  . CHOLECYSTECTOMY     laproscopic  . CHOLECYSTECTOMY, LAPAROSCOPIC    . kidney stone removed  2011  . MASS EXCISION Left 07/12/2019   Procedure: EXCISION LEFT AXILLARY MASS;  Surgeon: Coralie Keens, MD;  Location: [place];  Service: General;  Laterality: Left;  . RE-EXCISION OF BREAST LUMPECTOMY Left 10/05/2019   Procedure: RE-EXCISION OF LEFT BREAST CANCER;  Surgeon: Coralie Keens, MD;  Location: [place];  Service: General;  Laterality: Left;  . THYROGLOSSAL  DUCT CYST  2000  . TONSILLECTOMY  7591  . umbilical herniorrhapy    . VAGINAL HYSTERECTOMY  2003    There were no vitals filed for this visit.   Subjective Assessment - 06/27/20 1355    Subjective Relays her Rt hip pain is doing better, pain is down to 6/10. She has been using massager and heat at home which is helping    Pertinent History PMH: breast CA with lumpectomy 09/2019,IBS,HTN,    How long can you stand comfortably? depends    How long can you walk comfortably? depends    Patient Stated Goals strengthen her hip    Pain Onset More than a month ago            [facility] Adult PT Treatment/Exercise - 06/27/20 0001      Knee/Hip Exercises: Stretches   Active Hamstring Stretch Both;2 reps;30 seconds    Active Hamstring Stretch Limitations seated    ITB Stretch Right;3 reps;20 seconds    ITB Stretch Limitations supine with strap, muti cues not to go into pain    Piriformis Stretch Right;2 reps;30 seconds    Piriformis Stretch Limitations seated      Knee/Hip Exercises: Aerobic   Nustep 8 min UE/LE L5      Knee/Hip Exercises: Standing   Hip Flexion Both;15 reps    Hip Abduction Both;15 reps  Knee/Hip Exercises: Supine   Hip Adduction Isometric 2 sets;10 reps    Hip Adduction Isometric Limitations holding 5 sec    Bridges 2 sets;15 reps    Other Supine Knee/Hip Exercises supine clam green 2X15      Modalities   Modalities Moist Heat      Moist Heat Therapy   Number Minutes Moist Heat 7 Minutes    Moist Heat Location Hip      Manual Therapy   Manual therapy comments Rt lateral hip IASTM with percussive gun 8 min, positioned in Lt sidelying                    PT Short Term Goals - 06/09/20 1034      PT SHORT TERM GOAL #1   Title Pt will be Ind and compliant with initial HEP.    Time 4    Period Weeks    Status New    Target Date 07/07/20      PT SHORT TERM GOAL #2   Title Pt will reduce pain overall 25% in Rt hip.    Status New              PT Long Term Goals - 06/09/20 1034      PT LONG TERM GOAL #1   Title Pt will reduce pain overall 50% in Rt hip and Rt shoulder    Time 8    Period Weeks    Status New    Target Date 08/04/20      PT LONG TERM GOAL #2   Title Pt will improve overall Rt hip strength to 4+/5 and Rt knee strength to 5/5 tested in sitting    Time 8    Period Weeks    Status New      PT LONG TERM GOAL #3   Title Pt will improve TUG to less than 14 seconds    Time 8    Period Weeks    Status New      PT LONG TERM GOAL #4   Title Pt will improve 5TSTS to less than 15 seconds    Status New                 Plan - 06/27/20 1423    Clinical Impression Statement Her overall hip pain appears to be improving with PT. Continued with stretching and strengthening to tolerance and used manual with heat at end for further pain reduction. Continue POC    Personal Factors and Comorbidities Comorbidity 3+    Comorbidities PMH: breast CA with lumpectomy 09/2019,IBS,HTN,    Examination-Activity Limitations Carry;Squat;Stairs;Stand;Lift;Locomotion Level;Reach Overhead;Transfers    Examination-Participation Restrictions Church;Cleaning;Community Activity;Driving;Laundry;Shop    Stability/Clinical Decision Making Evolving/Moderate complexity    Rehab Potential Good    PT Frequency 2x / week   1-2   PT Duration 8 weeks    PT Treatment/Interventions ADLs/Self Care Home Management;Cryotherapy;Electrical Stimulation;Iontophoresis 4mg /ml Dexamethasone;Moist Heat;Ultrasound;Traction;Gait training;Stair training;Therapeutic activities;Therapeutic exercise;Balance training;Neuromuscular re-education;Manual techniques;Passive range of motion;Dry needling;Joint Manipulations;Taping;Vasopneumatic Device    PT Next Visit Plan focus more on Rt LE strength as this is her biggest complaint but if she starts complaining about Rt shoulder add in execises for this    PT Home Exercise Plan Access Code: [email or web address]    Consulted  and Agree with Plan of Care Patient           Patient will benefit from skilled therapeutic intervention in order to improve the following deficits and impairments:  Abnormal gait,  Decreased activity tolerance, Decreased balance, Decreased mobility, Decreased endurance, Decreased range of motion, Decreased strength, Difficulty walking, Hypomobility, Impaired flexibility, Increased muscle spasms, Improper body mechanics, Postural dysfunction, Obesity, Pain  Visit Diagnosis: Pain in right hip  Muscle weakness (generalized)  Other abnormalities of gait and mobility  Chronic right shoulder pain     Problem List Patient Active Problem List   Diagnosis Date Noted  . Dysuria 05/16/2020  . Right knee pain 05/16/2020  . Carcinoma of upper-outer quadrant of left breast in female, estrogen receptor positive ([place]) 08/25/2019  . Bilateral breast cancer ([place]) 08/17/2019  . Skin nodule 05/11/2019  . B12 deficiency 05/11/2019  . Vitamin D deficiency 05/11/2019  . Acute lymphadenitis 07/16/2017  . Eustachian tube dysfunction, bilateral 07/16/2017  . 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 Sudden right hearing loss 03/25/2017  . Left otitis media 03/25/2017  . Rash 11/07/2015  . Abnormal mammogram 09/13/2015  . Blister of toe of left foot 04/13/2015  . Breast discharge 04/13/2015  . Wellness examination 08/06/2011  . Microscopic hematuria 04/27/2010  . ANXIETY 03/25/2009  . PARESTHESIA 03/23/2009  . PERIPHERAL EDEMA 03/23/2009  . HEMORRHOIDS, INTERNAL 02/13/2008  . DIVERTICULOSIS, COLON 02/13/2008  . IBS 02/13/2008  . CHOLECYSTECTOMY, LAPAROSCOPIC, HX OF 02/13/2008  . OBESITY NOS 07/07/2007  . Diabetes ([place]) 07/06/2007  . Hyperlipidemia 07/06/2007  . Essential hypertension 07/06/2007    Silvestre Mesi 06/27/2020, 2:25 PM  [facility] [address] [place], [place], [address] Phone: [phone]   Fax:  [phone]  Name:  Symphanie Cederberg MRN: [id number] Date of Birth: 1946/08/17

## 2020-06-29 ENCOUNTER — Other Ambulatory Visit: Payer: Self-pay

## 2020-06-29 ENCOUNTER — Ambulatory Visit (INDEPENDENT_AMBULATORY_CARE_PROVIDER_SITE_OTHER): Payer: Medicare Other | Admitting: Rehabilitative and Restorative Service Providers"

## 2020-06-29 ENCOUNTER — Encounter: Payer: Self-pay | Admitting: Rehabilitative and Restorative Service Providers"

## 2020-06-29 DIAGNOSIS — M25551 Pain in right hip: Secondary | ICD-10-CM | POA: Diagnosis not present

## 2020-06-29 DIAGNOSIS — M6281 Muscle weakness (generalized): Secondary | ICD-10-CM

## 2020-06-29 DIAGNOSIS — G8929 Other chronic pain: Secondary | ICD-10-CM

## 2020-06-29 DIAGNOSIS — R2689 Other abnormalities of gait and mobility: Secondary | ICD-10-CM

## 2020-06-29 DIAGNOSIS — M25511 Pain in right shoulder: Secondary | ICD-10-CM

## 2020-06-29 NOTE — Therapy (Signed)
[facility] Therapy [address] [place], [place], [address] Phone: [phone]   Fax:  [phone]  Physical Therapy Treatment  Patient Details  Name: Connie Marshall MRN: [id number] Date of Birth: 1946/02/28 Referring Provider (PT): Dr. Lynne Leader   Encounter Date: 06/29/2020   PT End of Session - 06/29/20 1428    Visit Number 4    Number of Visits 15    Date for PT Re-Evaluation 08/04/20    Authorization Type UHC MCR    Progress Note Due on Visit 10    PT Start Time 1428    PT Stop Time 1508    PT Time Calculation (min) 40 min    Activity Tolerance Patient tolerated treatment well    Behavior During Therapy [facility] for tasks assessed/performed           Past Medical History:  Diagnosis Date  . Axillary mass, left   . Cancer ([place])   . Complication of anesthesia   . Diverticulosis   . Hemorrhoids   . Hyperlipidemia   . Hypertension   . IBS (irritable bowel syndrome)   . Obesity   . PONV (postoperative nausea and vomiting)   . Pre-diabetes     Past Surgical History:  Procedure Laterality Date  . BREAST LUMPECTOMY WITH RADIOACTIVE SEED AND SENTINEL LYMPH NODE BIOPSY Bilateral 10/05/2019   Procedure: BILATERAL BREAST LUMPECTOMY WITH BILATERAL RADIOACTIVE SEEDS AND LEFT SENTINEL LYMPH NODE BIOPSY;  Surgeon: Coralie Keens, MD;  Location: [place];  Service: General;  Laterality: Bilateral;  . CESAREAN SECTION     2 times  . CHOLECYSTECTOMY     laproscopic  . CHOLECYSTECTOMY, LAPAROSCOPIC    . kidney stone removed  2011  . MASS EXCISION Left 07/12/2019   Procedure: EXCISION LEFT AXILLARY MASS;  Surgeon: Coralie Keens, MD;  Location: [place];  Service: General;  Laterality: Left;  . RE-EXCISION OF BREAST LUMPECTOMY Left 10/05/2019   Procedure: RE-EXCISION OF LEFT BREAST CANCER;  Surgeon: Coralie Keens, MD;  Location: [place];  Service: General;  Laterality: Left;  . THYROGLOSSAL  DUCT CYST  2000  . TONSILLECTOMY  8546  . umbilical herniorrhapy    . VAGINAL HYSTERECTOMY  2003    There were no vitals filed for this visit.                      [place] Adult PT Treatment/Exercise - 06/29/20 0001      Neuro Re-ed    Neuro Re-ed Details  tandem ambulation fwd 10 ft x 5 each way, side to side stepping 10 ft x 5 each way      Knee/Hip Exercises: Seated   Sit to Sand 10 reps;without UE support   22 inch table     Knee/Hip Exercises: Supine   Bridges 2 sets;15 reps      Knee/Hip Exercises: Sidelying   Clams 3x10 rt      Manual Therapy   Manual therapy comments percussive device Rt glute med/min, IT band, lateral quad                    PT Short Term Goals - 06/29/20 1453      PT SHORT TERM GOAL #1   Title Pt will be Ind and compliant with initial HEP.    Time 4    Period Weeks    Status On-going    Target Date 07/07/20      PT SHORT TERM  GOAL #2   Title Pt will reduce pain overall 25% in Rt hip.    Status On-going             PT Long Term Goals - 06/09/20 1034      PT LONG TERM GOAL #1   Title Pt will reduce pain overall 50% in Rt hip and Rt shoulder    Time 8    Period Weeks    Status New    Target Date 08/04/20      PT LONG TERM GOAL #2   Title Pt will improve overall Rt hip strength to 4+/5 and Rt knee strength to 5/5 tested in sitting    Time 8    Period Weeks    Status New      PT LONG TERM GOAL #3   Title Pt will improve TUG to less than 14 seconds    Time 8    Period Weeks    Status New      PT LONG TERM GOAL #4   Title Pt will improve 5TSTS to less than 15 seconds    Status New                 Plan - 06/29/20 1451    Clinical Impression Statement Rt LE weakness and bilateral movement coordination defiicts impacting ability to perform ambulation s deviations.    Personal Factors and Comorbidities Comorbidity 3+    Comorbidities PMH: breast CA with lumpectomy 09/2019,IBS,HTN,     Examination-Activity Limitations Carry;Squat;Stairs;Stand;Lift;Locomotion Level;Reach Overhead;Transfers    Examination-Participation Restrictions Church;Cleaning;Community Activity;Driving;Laundry;Shop    Stability/Clinical Decision Making Evolving/Moderate complexity    Rehab Potential Good    PT Frequency 2x / week   1-2   PT Duration 8 weeks    PT Treatment/Interventions ADLs/Self Care Home Management;Cryotherapy;Electrical Stimulation;Iontophoresis 4mg /ml Dexamethasone;Moist Heat;Ultrasound;Traction;Gait training;Stair training;Therapeutic activities;Therapeutic exercise;Balance training;Neuromuscular re-education;Manual techniques;Passive range of motion;Dry needling;Joint Manipulations;Taping;Vasopneumatic Device    PT Next Visit Plan Continued LE strengthening, compliant and non compliant surface balance.    PT Home Exercise Plan Access Code: [email or web address]    Consulted and Agree with Plan of Care Patient           Patient will benefit from skilled therapeutic intervention in order to improve the following deficits and impairments:  Abnormal gait, Decreased activity tolerance, Decreased balance, Decreased mobility, Decreased endurance, Decreased range of motion, Decreased strength, Difficulty walking, Hypomobility, Impaired flexibility, Increased muscle spasms, Improper body mechanics, Postural dysfunction, Obesity, Pain  Visit Diagnosis: Pain in right hip  Muscle weakness (generalized)  Other abnormalities of gait and mobility  Chronic right shoulder pain     Problem List Patient Active Problem List   Diagnosis Date Noted  . Dysuria 05/16/2020  . Right knee pain 05/16/2020  . Carcinoma of upper-outer quadrant of left breast in female, estrogen receptor positive ([place]) 08/25/2019  . Bilateral breast cancer ([place]) 08/17/2019  . Skin nodule 05/11/2019  . B12 deficiency 05/11/2019  . Vitamin D deficiency 05/11/2019  . Acute lymphadenitis 07/16/2017  . Eustachian tube dysfunction,  bilateral 07/16/2017  . 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 Sudden right hearing loss 03/25/2017  . Left otitis media 03/25/2017  . Rash 11/07/2015  . Abnormal mammogram 09/13/2015  . Blister of toe of left foot 04/13/2015  . Breast discharge 04/13/2015  . Wellness examination 08/06/2011  . Microscopic hematuria 04/27/2010  . ANXIETY 03/25/2009  . PARESTHESIA 03/23/2009  . PERIPHERAL EDEMA 03/23/2009  . HEMORRHOIDS, INTERNAL 02/13/2008  . DIVERTICULOSIS, COLON 02/13/2008  .  IBS 02/13/2008  . CHOLECYSTECTOMY, LAPAROSCOPIC, HX OF 02/13/2008  . OBESITY NOS 07/07/2007  . Diabetes ([place]) 07/06/2007  . Hyperlipidemia 07/06/2007  . Essential hypertension 07/06/2007   Scot Jun, PT, DPT, OCS, ATC 06/29/20  2:54 PM    [facility] [address] [place], [place], [address] Phone: [phone]   Fax:  [phone]  Name: Connie Marshall MRN: [id number] Date of Birth: 05/26/1946

## 2020-07-04 ENCOUNTER — Ambulatory Visit (INDEPENDENT_AMBULATORY_CARE_PROVIDER_SITE_OTHER): Payer: Medicare Other | Admitting: Physical Therapy

## 2020-07-04 ENCOUNTER — Other Ambulatory Visit: Payer: Self-pay

## 2020-07-04 DIAGNOSIS — M6281 Muscle weakness (generalized): Secondary | ICD-10-CM

## 2020-07-04 DIAGNOSIS — R2689 Other abnormalities of gait and mobility: Secondary | ICD-10-CM

## 2020-07-04 DIAGNOSIS — M25551 Pain in right hip: Secondary | ICD-10-CM

## 2020-07-04 NOTE — Therapy (Signed)
[facility] Therapy [address] [place], [place], [address] Phone: [phone]   Fax:  [phone]  Physical Therapy Treatment  Patient Details  Name: Connie Marshall MRN: [id number] Date of Birth: 1946-03-16 Referring Provider (PT): Dr. Lynne Leader   Encounter Date: 07/04/2020   PT End of Session - 07/04/20 1048    Visit Number 5    Number of Visits 15    Date for PT Re-Evaluation 08/04/20    Authorization Type UHC MCR    Progress Note Due on Visit 10    PT Start Time 1015    PT Stop Time 1100    PT Time Calculation (min) 45 min    Activity Tolerance Patient tolerated treatment well    Behavior During Therapy [facility] for tasks assessed/performed           Past Medical History:  Diagnosis Date  . Axillary mass, left   . Cancer ([place])   . Complication of anesthesia   . Diverticulosis   . Hemorrhoids   . Hyperlipidemia   . Hypertension   . IBS (irritable bowel syndrome)   . Obesity   . PONV (postoperative nausea and vomiting)   . Pre-diabetes     Past Surgical History:  Procedure Laterality Date  . BREAST LUMPECTOMY WITH RADIOACTIVE SEED AND SENTINEL LYMPH NODE BIOPSY Bilateral 10/05/2019   Procedure: BILATERAL BREAST LUMPECTOMY WITH BILATERAL RADIOACTIVE SEEDS AND LEFT SENTINEL LYMPH NODE BIOPSY;  Surgeon: Coralie Keens, MD;  Location: [place];  Service: General;  Laterality: Bilateral;  . CESAREAN SECTION     2 times  . CHOLECYSTECTOMY     laproscopic  . CHOLECYSTECTOMY, LAPAROSCOPIC    . kidney stone removed  2011  . MASS EXCISION Left 07/12/2019   Procedure: EXCISION LEFT AXILLARY MASS;  Surgeon: Coralie Keens, MD;  Location: [place];  Service: General;  Laterality: Left;  . RE-EXCISION OF BREAST LUMPECTOMY Left 10/05/2019   Procedure: RE-EXCISION OF LEFT BREAST CANCER;  Surgeon: Coralie Keens, MD;  Location: [place];  Service: General;  Laterality: Left;  . THYROGLOSSAL  DUCT CYST  2000  . TONSILLECTOMY  5974  . umbilical herniorrhapy    . VAGINAL HYSTERECTOMY  2003    There were no vitals filed for this visit.   Subjective Assessment - 07/04/20 1018    Subjective Relays not much pain in her Rt hip, it just feels so stiff.    Pertinent History PMH: breast CA with lumpectomy 09/2019,IBS,HTN,    How long can you stand comfortably? depends    How long can you walk comfortably? depends    Patient Stated Goals strengthen her hip    Pain Onset More than a month ago              [facility] PT Assessment - 07/04/20 0001      Assessment   Medical Diagnosis Rt hip pain, trochanteric bursiitis, impingement/RTC tendonopathy Rt shoulder    Referring Provider (PT) Dr. Lynne Leader      Strength   Overall Strength Comments tested in sitting    Right Hip Flexion 4+/5    Right Hip ABduction 4+/5    Right Knee Flexion 4+/5    Right Knee Extension 4+/5            [facility] Adult PT Treatment/Exercise - 07/04/20 0001      Knee/Hip Exercises: Stretches   Active Hamstring Stretch Right;3 reps;30 seconds    Active Hamstring Stretch Limitations seated  Piriformis Stretch Right;3 reps    Piriformis Stretch Limitations seated    Other Knee/Hip Stretches SKTC stretch Rt leg 3X30 sec      Knee/Hip Exercises: Standing   Hip Flexion Both;15 reps    Hip Flexion Limitations 2 lbs    Hip Abduction Both;15 reps    Abduction Limitations 2 lbs      Knee/Hip Exercises: Seated   Long Arc Quad Both;20 reps    Long Arc Quad Weight 2 lbs.    Sit to Sand 2 sets;10 reps;without UE support   22 inch table     Knee/Hip Exercises: Supine   Bridges 2 sets;15 reps      Knee/Hip Exercises: Sidelying   Clams 3x10 rt      Moist Heat Therapy   Number Minutes Moist Heat 7 Minutes    Moist Heat Location Hip      Manual Therapy   Manual therapy comments Rt hip PROM into ER, IR, flexion, and cirlces (most stiffness noted in IR) percussive device Rt glute med/min, IT band, lateral  quad                    PT Short Term Goals - 07/04/20 1050      PT SHORT TERM GOAL #1   Title Pt will be Ind and compliant with initial HEP.    Time 4    Period Weeks    Status On-going    Target Date 07/07/20      PT SHORT TERM GOAL #2   Title Pt will reduce pain overall 25% in Rt hip.    Baseline met 9/21    Status Achieved             PT Long Term Goals - 07/04/20 1051      PT LONG TERM GOAL #1   Title Pt will reduce pain overall 50% in Rt hip and Rt shoulder    Baseline met most of the time now on 9/21    Time 8    Period Weeks    Status On-going      PT LONG TERM GOAL #2   Title Pt will improve overall Rt hip strength to 4+/5 and Rt knee strength to 5/5 tested in sitting    Baseline now 4+    Time 8    Period Weeks    Status On-going      PT LONG TERM GOAL #3   Title Pt will improve TUG to less than 14 seconds    Time 8    Period Weeks    Status New      PT LONG TERM GOAL #4   Title Pt will improve 5TSTS to less than 15 seconds    Status New                 Plan - 07/04/20 1051    Clinical Impression Statement She is progressing overall with Rt leg strength and is not having much pain anyone however still with significant Rt leg weakness, Rt hip stiffness, and balance deficits and will continue to benefit from skilled PT.    Personal Factors and Comorbidities Comorbidity 3+    Comorbidities PMH: breast CA with lumpectomy 09/2019,IBS,HTN,    Examination-Activity Limitations Carry;Squat;Stairs;Stand;Lift;Locomotion Level;Reach Overhead;Transfers    Examination-Participation Restrictions Church;Cleaning;Community Activity;Driving;Laundry;Shop    Stability/Clinical Decision Making Evolving/Moderate complexity    Rehab Potential Good    PT Frequency 2x / week   1-2   PT Duration 8 weeks  PT Treatment/Interventions ADLs/Self Care Home Management;Cryotherapy;Electrical Stimulation;Iontophoresis 32m/ml Dexamethasone;Moist  Heat;Ultrasound;Traction;Gait training;Stair training;Therapeutic activities;Therapeutic exercise;Balance training;Neuromuscular re-education;Manual techniques;Passive range of motion;Dry needling;Joint Manipulations;Taping;Vasopneumatic Device    PT Next Visit Plan Continued LE strengthening, compliant and non compliant surface balance.    PT Home Exercise Plan Access Code: [email or web address]    Consulted and Agree with Plan of Care Patient           Patient will benefit from skilled therapeutic intervention in order to improve the following deficits and impairments:  Abnormal gait, Decreased activity tolerance, Decreased balance, Decreased mobility, Decreased endurance, Decreased range of motion, Decreased strength, Difficulty walking, Hypomobility, Impaired flexibility, Increased muscle spasms, Improper body mechanics, Postural dysfunction, Obesity, Pain  Visit Diagnosis: Pain in right hip  Muscle weakness (generalized)  Other abnormalities of gait and mobility     Problem List Patient Active Problem List   Diagnosis Date Noted  . Dysuria 05/16/2020  . Right knee pain 05/16/2020  . Carcinoma of upper-outer quadrant of left breast in [place], estrogen receptor positive [place] 08/25/2019  . [place] 08/17/2019  . Skin nodule 05/11/2019  . B12 deficiency 05/11/2019  . Vitamin D deficiency 05/11/2019  . Acute lymphadenitis 07/16/2017  . Eustachian tube dysfunction, bilateral 07/16/2017  . 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 Sudden right hearing loss 03/25/2017  . Left otitis media 03/25/2017  . Rash 11/07/2015  . Abnormal mammogram 09/13/2015  . Blister of toe of left foot 04/13/2015  . Breast discharge 04/13/2015  . Wellness examination 08/06/2011  . Microscopic hematuria 04/27/2010  . ANXIETY 03/25/2009  . PARESTHESIA 03/23/2009  . PERIPHERAL EDEMA 03/23/2009  . HEMORRHOIDS, INTERNAL 02/13/2008  . DIVERTICULOSIS, COLON 02/13/2008  . IBS  02/13/2008  . CHOLECYSTECTOMY, LAPAROSCOPIC, HX OF 02/13/2008  . OBESITY NOS 07/07/2007  . Diabetes ([place] 07/06/2007  . Hyperlipidemia 07/06/2007  . Essential hypertension 07/06/2007    BSilvestre Mesi9/21/2021, 10:53 AM  [facility] [address] [address]Phone: [phone]  Fax:  [phone] Name: Connie WolkenMRN: [id number] of Birth: [birth year]

## 2020-07-06 ENCOUNTER — Other Ambulatory Visit: Payer: Self-pay

## 2020-07-06 ENCOUNTER — Encounter: Payer: Self-pay | Admitting: Rehabilitative and Restorative Service Providers"

## 2020-07-06 ENCOUNTER — Ambulatory Visit (INDEPENDENT_AMBULATORY_CARE_PROVIDER_SITE_OTHER): Payer: Medicare Other | Admitting: Rehabilitative and Restorative Service Providers"

## 2020-07-06 DIAGNOSIS — M25551 Pain in right hip: Secondary | ICD-10-CM | POA: Diagnosis not present

## 2020-07-06 DIAGNOSIS — M6281 Muscle weakness (generalized): Secondary | ICD-10-CM | POA: Diagnosis not present

## 2020-07-06 DIAGNOSIS — M25511 Pain in right shoulder: Secondary | ICD-10-CM | POA: Diagnosis not present

## 2020-07-06 DIAGNOSIS — R2689 Other abnormalities of gait and mobility: Secondary | ICD-10-CM | POA: Diagnosis not present

## 2020-07-06 DIAGNOSIS — G8929 Other chronic pain: Secondary | ICD-10-CM

## 2020-07-06 NOTE — Therapy (Signed)
[facility] Physical Therapy [address] [place], [place], [address] Phone: [phone]   Fax:  [phone]  Physical Therapy Treatment  Patient Details  Name: Connie Marshall MRN: [id number] Date of Birth: 1946-06-09 Referring Provider (PT): Dr. Lynne Leader   Encounter Date: 07/06/2020   PT End of Session - 07/06/20 1518    Visit Number 6    Number of Visits 15    Date for PT Re-Evaluation 08/04/20    Authorization Type UHC MCR    Progress Note Due on Visit 10    PT Start Time 1510    PT Stop Time 1550    PT Time Calculation (min) 40 min    Activity Tolerance Patient tolerated treatment well    Behavior During Therapy [facility] for tasks assessed/performed           Past Medical History:  Diagnosis Date  . Axillary mass, left   . Cancer ([place])   . Complication of anesthesia   . Diverticulosis   . Hemorrhoids   . Hyperlipidemia   . Hypertension   . IBS (irritable bowel syndrome)   . Obesity   . PONV (postoperative nausea and vomiting)   . Pre-diabetes     Past Surgical History:  Procedure Laterality Date  . BREAST LUMPECTOMY WITH RADIOACTIVE SEED AND SENTINEL LYMPH NODE BIOPSY Bilateral 10/05/2019   Procedure: BILATERAL BREAST LUMPECTOMY WITH BILATERAL RADIOACTIVE SEEDS AND LEFT SENTINEL LYMPH NODE BIOPSY;  Surgeon: Coralie Keens, MD;  Location: [place];  Service: General;  Laterality: Bilateral;  . CESAREAN SECTION     2 times  . CHOLECYSTECTOMY     laproscopic  . CHOLECYSTECTOMY, LAPAROSCOPIC    . kidney stone removed  2011  . MASS EXCISION Left 07/12/2019   Procedure: EXCISION LEFT AXILLARY MASS;  Surgeon: Coralie Keens, MD;  Location: [place];  Service: General;  Laterality: Left;  . RE-EXCISION OF BREAST LUMPECTOMY Left 10/05/2019   Procedure: RE-EXCISION OF LEFT BREAST CANCER;  Surgeon: Coralie Keens, MD;  Location: [place];  Service: General;  Laterality: Left;  . THYROGLOSSAL  DUCT CYST  2000  . TONSILLECTOMY  8638  . umbilical herniorrhapy    . VAGINAL HYSTERECTOMY  2003    There were no vitals filed for this visit.   Subjective Assessment - 07/06/20 1515    Subjective Pt. indicated having more complaints today, "maybe due to weather change."  Pt. stated feeling increased soreness.    Pertinent History PMH: breast CA with lumpectomy 09/2019,IBS,HTN,    How long can you stand comfortably? depends    How long can you walk comfortably? depends    Patient Stated Goals strengthen her hip    Currently in Pain? Yes    Pain Score 8     Pain Location Hip    Pain Orientation Right    Pain Descriptors / Indicators Aching;Sore;Tightness    Pain Type Chronic pain    Pain Onset More than a month ago    Pain Frequency Intermittent    Aggravating Factors  weather change (woke up feeling worse)    Pain Relieving Factors OTC medicine/biofreeze                             [facility] Adult PT Treatment/Exercise - 07/06/20 0001      Knee/Hip Exercises: Stretches   Other Knee/Hip Stretches SKTC Rt 30 sec x 3 bilateral   c strap  Knee/Hip Exercises: Aerobic   Nustep Lvl 5 12 mins      Knee/Hip Exercises: Seated   Sit to Sand without UE support;15 reps   22 inch table     Knee/Hip Exercises: Supine   Bridges 3 sets;10 reps    Straight Leg Raises 2 sets;10 reps;Both    Other Supine Knee/Hip Exercises supine green clam shell 2 x 15 bilateral                    PT Short Term Goals - 07/04/20 1050      PT SHORT TERM GOAL #1   Title Pt will be Ind and compliant with initial HEP.    Time 4    Period Weeks    Status On-going    Target Date 07/07/20      PT SHORT TERM GOAL #2   Title Pt will reduce pain overall 25% in Rt hip.    Baseline met 9/21    Status Achieved             PT Long Term Goals - 07/04/20 1051      PT LONG TERM GOAL #1   Title Pt will reduce pain overall 50% in Rt hip and Rt shoulder    Baseline met most of  the time now on 9/21    Time 8    Period Weeks    Status On-going      PT LONG TERM GOAL #2   Title Pt will improve overall Rt hip strength to 4+/5 and Rt knee strength to 5/5 tested in sitting    Baseline now 4+    Time 8    Period Weeks    Status On-going      PT LONG TERM GOAL #3   Title Pt will improve TUG to less than 14 seconds    Time 8    Period Weeks    Status New      PT LONG TERM GOAL #4   Title Pt will improve 5TSTS to less than 15 seconds    Status New                 Plan - 07/06/20 1528    Clinical Impression Statement Adapted intervention today to insidious exacerbation of symptom in last 24 hours.  Focus on primarily promoting improved mobility for tigthness complaints and muscle activation in NWB positioning.    Personal Factors and Comorbidities Comorbidity 3+    Comorbidities PMH: breast CA with lumpectomy 09/2019,IBS,HTN,    Examination-Activity Limitations Carry;Squat;Stairs;Stand;Lift;Locomotion Level;Reach Overhead;Transfers    Examination-Participation Restrictions Church;Cleaning;Community Activity;Driving;Laundry;Shop    Stability/Clinical Decision Making Evolving/Moderate complexity    Rehab Potential Good    PT Frequency 2x / week   1-2   PT Duration 8 weeks    PT Treatment/Interventions ADLs/Self Care Home Management;Cryotherapy;Electrical Stimulation;Iontophoresis 63m/ml Dexamethasone;Moist Heat;Ultrasound;Traction;Gait training;Stair training;Therapeutic activities;Therapeutic exercise;Balance training;Neuromuscular re-education;Manual techniques;Passive range of motion;Dry needling;Joint Manipulations;Taping;Vasopneumatic Device    PT Next Visit Plan Resume balance/LE strengthening in WB as tolerated.    PT Home Exercise Plan Access Code: [email or web address]    Consulted and Agree with Plan of Care Patient           Patient will benefit from skilled therapeutic intervention in order to improve the following deficits and impairments:  Abnormal  gait, Decreased activity tolerance, Decreased balance, Decreased mobility, Decreased endurance, Decreased range of motion, Decreased strength, Difficulty walking, Hypomobility, Impaired flexibility, Increased muscle spasms, Improper body mechanics, Postural dysfunction, Obesity, Pain  Visit Diagnosis: Pain in right hip  Muscle weakness (generalized)  Other abnormalities of gait and mobility  Chronic right shoulder pain     Problem List Patient Active Problem List   Diagnosis Date Noted  . Dysuria 05/16/2020  . Right knee pain 05/16/2020  . Carcinoma of upper-outer quadrant of left breast in female, estrogen receptor positive ([place]) 08/25/2019  . Bilateral breast cancer ([place]) 08/17/2019  . Skin nodule 05/11/2019  . B12 deficiency 05/11/2019  . Vitamin D deficiency 05/11/2019  . Acute lymphadenitis 07/16/2017  . Eustachian tube dysfunction, bilateral 07/16/2017  . 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 Sudden right hearing loss 03/25/2017  . Left otitis media 03/25/2017  . Rash 11/07/2015  . Abnormal mammogram 09/13/2015  . Blister of toe of left foot 04/13/2015  . Breast discharge 04/13/2015  . Wellness examination 08/06/2011  . Microscopic hematuria 04/27/2010  . ANXIETY 03/25/2009  . PARESTHESIA 03/23/2009  . PERIPHERAL EDEMA 03/23/2009  . HEMORRHOIDS, INTERNAL 02/13/2008  . DIVERTICULOSIS, COLON 02/13/2008  . IBS 02/13/2008  . CHOLECYSTECTOMY, LAPAROSCOPIC, HX OF 02/13/2008  . OBESITY NOS 07/07/2007  . Diabetes ([place]) 07/06/2007  . Hyperlipidemia 07/06/2007  . Essential hypertension 07/06/2007    Scot Jun, PT, DPT, OCS, ATC 07/06/20  3:38 PM    [facility] Physical Therapy [address] [place], [place], [address] Phone: [phone]   Fax:  [phone]  Name: Chinita Schimpf MRN: [id number] Date of Birth: Mar 09, 1946

## 2020-07-11 ENCOUNTER — Other Ambulatory Visit: Payer: Self-pay

## 2020-07-11 ENCOUNTER — Ambulatory Visit (INDEPENDENT_AMBULATORY_CARE_PROVIDER_SITE_OTHER): Payer: Medicare Other | Admitting: Physical Therapy

## 2020-07-11 DIAGNOSIS — M6281 Muscle weakness (generalized): Secondary | ICD-10-CM

## 2020-07-11 DIAGNOSIS — M25511 Pain in right shoulder: Secondary | ICD-10-CM

## 2020-07-11 DIAGNOSIS — R2689 Other abnormalities of gait and mobility: Secondary | ICD-10-CM

## 2020-07-11 DIAGNOSIS — M25551 Pain in right hip: Secondary | ICD-10-CM

## 2020-07-11 DIAGNOSIS — G8929 Other chronic pain: Secondary | ICD-10-CM

## 2020-07-11 NOTE — Therapy (Signed)
[facility] Physical Therapy [address] [place], [place], [address] Phone: [phone]   Fax:  [phone]  Physical Therapy Treatment  Patient Details  Name: Connie Marshall MRN: [id number] Date of Birth: June 10, 1946 Referring Provider (PT): Dr. Lynne Leader   Encounter Date: 07/11/2020   PT End of Session - 07/11/20 1047    Visit Number 7    Number of Visits 15    Date for PT Re-Evaluation 08/04/20    Authorization Type UHC MCR    Progress Note Due on Visit 10    PT Start Time 1015    PT Stop Time 1100    PT Time Calculation (min) 45 min    Activity Tolerance Patient tolerated treatment well    Behavior During Therapy [facility] for tasks assessed/performed           Past Medical History:  Diagnosis Date  . Axillary mass, left   . Cancer ([place])   . Complication of anesthesia   . Diverticulosis   . Hemorrhoids   . Hyperlipidemia   . Hypertension   . IBS (irritable bowel syndrome)   . Obesity   . PONV (postoperative nausea and vomiting)   . Pre-diabetes     Past Surgical History:  Procedure Laterality Date  . BREAST LUMPECTOMY WITH RADIOACTIVE SEED AND SENTINEL LYMPH NODE BIOPSY Bilateral 10/05/2019   Procedure: BILATERAL BREAST LUMPECTOMY WITH BILATERAL RADIOACTIVE SEEDS AND LEFT SENTINEL LYMPH NODE BIOPSY;  Surgeon: Coralie Keens, MD;  Location: [place];  Service: General;  Laterality: Bilateral;  . CESAREAN SECTION     2 times  . CHOLECYSTECTOMY     laproscopic  . CHOLECYSTECTOMY, LAPAROSCOPIC    . kidney stone removed  2011  . MASS EXCISION Left 07/12/2019   Procedure: EXCISION LEFT AXILLARY MASS;  Surgeon: Coralie Keens, MD;  Location: [place];  Service: General;  Laterality: Left;  . RE-EXCISION OF BREAST LUMPECTOMY Left 10/05/2019   Procedure: RE-EXCISION OF LEFT BREAST CANCER;  Surgeon: Coralie Keens, MD;  Location: [place];  Service: General;  Laterality: Left;  . THYROGLOSSAL  DUCT CYST  2000  . TONSILLECTOMY  2094  . umbilical herniorrhapy    . VAGINAL HYSTERECTOMY  2003    There were no vitals filed for this visit.   Subjective Assessment - 07/11/20 1032    Subjective relays her Rt hip is feeling better regarding the pain and stiffness    Pertinent History PMH: breast CA with lumpectomy 09/2019,IBS,HTN,    How long can you stand comfortably? depends    How long can you walk comfortably? depends    Patient Stated Goals strengthen her hip    Pain Onset More than a month ago              [facility] PT Assessment - 07/11/20 0001      Assessment   Medical Diagnosis Rt hip pain, trochanteric bursiitis, impingement/RTC tendonopathy Rt shoulder    Referring Provider (PT) Dr. Lynne Leader      Timed Up and Go Test   Normal TUG (seconds) 16.8            [facility] Adult PT Treatment/Exercise - 07/11/20 0001      Knee/Hip Exercises: Stretches   Active Hamstring Stretch Right;2 reps;30 seconds    Piriformis Stretch Right;3 reps    Piriformis Stretch Limitations seated    Other Knee/Hip Stretches SKTC  30 sec x 2 bilateral      Knee/Hip Exercises: Aerobic  Nustep Lvl 5 10 mins      Knee/Hip Exercises: Standing   Hip Flexion Both;15 reps    Hip Flexion Limitations 3 lbs    Hip Abduction Both;20 reps    Abduction Limitations 3 lbs      Knee/Hip Exercises: Seated   Long Arc Quad Both;20 reps    Long Arc Quad Weight 3 lbs.    Sit to Sand without UE support;15 reps   22 inch table     Knee/Hip Exercises: Supine   Bridges 3 sets;10 reps    Straight Leg Raises 2 sets;10 reps;Both    Other Supine Knee/Hip Exercises supine green clam shell 2 x 15 bilateral      Moist Heat Therapy   Number Minutes Moist Heat 7 Minutes    Moist Heat Location Hip                    PT Short Term Goals - 07/11/20 1046      PT SHORT TERM GOAL #1   Title Pt will be Ind and compliant with initial HEP.    Time 4    Period Weeks    Status On-going    Target Date  07/07/20      PT SHORT TERM GOAL #2   Title Pt will reduce pain overall 25% in Rt hip.    Baseline met 9/21    Status Achieved             PT Long Term Goals - 07/11/20 1046      PT LONG TERM GOAL #1   Title Pt will reduce pain overall 50% in Rt hip and Rt shoulder    Baseline met most of the time now on 9/21    Time 8    Period Weeks    Status On-going      PT LONG TERM GOAL #2   Title Pt will improve overall Rt hip strength to 4+/5 and Rt knee strength to 5/5 tested in sitting    Baseline now 4+    Time 8    Period Weeks    Status On-going      PT LONG TERM GOAL #3   Title Pt will improve TUG to less than 14 seconds    Baseline 16.8 on 9/28    Time 8    Period Weeks    Status On-going      PT LONG TERM GOAL #4   Title Pt will improve 5TSTS to less than 15 seconds    Status On-going                 Plan - 07/11/20 1048    Clinical Impression Statement Able to progress back to her normal strengthening program now that pain levels have calmed down. TUG retest did not improve that much and she still has significant deficits in strenght, balance, and gait. PT will continue to progress this as able.    Personal Factors and Comorbidities Comorbidity 3+    Comorbidities PMH: breast CA with lumpectomy 09/2019,IBS,HTN,    Examination-Activity Limitations Carry;Squat;Stairs;Stand;Lift;Locomotion Level;Reach Overhead;Transfers    Examination-Participation Restrictions Church;Cleaning;Community Activity;Driving;Laundry;Shop    Stability/Clinical Decision Making Evolving/Moderate complexity    Rehab Potential Good    PT Frequency 2x / week   1-2   PT Duration 8 weeks    PT Treatment/Interventions ADLs/Self Care Home Management;Cryotherapy;Electrical Stimulation;Iontophoresis 26m/ml Dexamethasone;Moist Heat;Ultrasound;Traction;Gait training;Stair training;Therapeutic activities;Therapeutic exercise;Balance training;Neuromuscular re-education;Manual techniques;Passive  range of motion;Dry needling;Joint Manipulations;Taping;Vasopneumatic Device  PT Next Visit Plan Resume balance/LE strengthening in WB as tolerated.    PT Home Exercise Plan Access Code: [email or web address]    Consulted and Agree with Plan of Care Patient           Patient will benefit from skilled therapeutic intervention in order to improve the following deficits and impairments:  Abnormal gait, Decreased activity tolerance, Decreased balance, Decreased mobility, Decreased endurance, Decreased range of motion, Decreased strength, Difficulty walking, Hypomobility, Impaired flexibility, Increased muscle spasms, Improper body mechanics, Postural dysfunction, Obesity, Pain  Visit Diagnosis: Pain in right hip  Muscle weakness (generalized)  Other abnormalities of gait and mobility  Chronic right shoulder pain     Problem List Patient Active Problem List   Diagnosis Date Noted  . Dysuria 05/16/2020  . Right knee pain 05/16/2020  . Carcinoma of upper-outer quadrant of left breast in female, estrogen receptor positive ([place]) 08/25/2019  . Bilateral breast cancer ([place]) 08/17/2019  . Skin nodule 05/11/2019  . B12 deficiency 05/11/2019  . Vitamin D deficiency 05/11/2019  . Acute lymphadenitis 07/16/2017  . Eustachian tube dysfunction, bilateral 07/16/2017  . 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 Sudden right hearing loss 03/25/2017  . Left otitis media 03/25/2017  . Rash 11/07/2015  . Abnormal mammogram 09/13/2015  . Blister of toe of left foot 04/13/2015  . Breast discharge 04/13/2015  . Wellness examination 08/06/2011  . Microscopic hematuria 04/27/2010  . ANXIETY 03/25/2009  . PARESTHESIA 03/23/2009  . PERIPHERAL EDEMA 03/23/2009  . HEMORRHOIDS, INTERNAL 02/13/2008  . DIVERTICULOSIS, COLON 02/13/2008  . IBS 02/13/2008  . CHOLECYSTECTOMY, LAPAROSCOPIC, HX OF 02/13/2008  . OBESITY NOS 07/07/2007  . Diabetes ([place]) 07/06/2007  . Hyperlipidemia 07/06/2007  . Essential  hypertension 07/06/2007    Debbe Odea, PT,DPT 07/11/2020, 10:55 AM  [facility] Physical Therapy [address] [place], [place], [address] Phone: [phone]   Fax:  [phone]  Name: Connie Marshall MRN: [id number] Date of Birth: 04-Sep-1946

## 2020-07-13 ENCOUNTER — Encounter: Payer: Self-pay | Admitting: Rehabilitative and Restorative Service Providers"

## 2020-07-13 ENCOUNTER — Ambulatory Visit (INDEPENDENT_AMBULATORY_CARE_PROVIDER_SITE_OTHER): Payer: Medicare Other | Admitting: Rehabilitative and Restorative Service Providers"

## 2020-07-13 ENCOUNTER — Other Ambulatory Visit: Payer: Self-pay

## 2020-07-13 DIAGNOSIS — M25551 Pain in right hip: Secondary | ICD-10-CM

## 2020-07-13 DIAGNOSIS — M6281 Muscle weakness (generalized): Secondary | ICD-10-CM

## 2020-07-13 DIAGNOSIS — R2689 Other abnormalities of gait and mobility: Secondary | ICD-10-CM | POA: Diagnosis not present

## 2020-07-13 DIAGNOSIS — G8929 Other chronic pain: Secondary | ICD-10-CM

## 2020-07-13 DIAGNOSIS — M25511 Pain in right shoulder: Secondary | ICD-10-CM

## 2020-07-13 NOTE — Therapy (Signed)
[facility] Physical Therapy [address] [place], [place], [address] Phone: [phone]   Fax:  [phone]  Physical Therapy Treatment  Patient Details  Name: Connie Marshall MRN: [id number] Date of Birth: 02/23/1946 Referring Provider (PT): Dr. Lynne Leader   Encounter Date: 07/13/2020   PT End of Session - 07/13/20 1104    Visit Number 8    Number of Visits 15    Date for PT Re-Evaluation 08/04/20    Authorization Type UHC MCR    Progress Note Due on Visit 10    PT Start Time 1100    PT Stop Time 1140    PT Time Calculation (min) 40 min    Activity Tolerance Patient tolerated treatment well    Behavior During Therapy [facility] for tasks assessed/performed           Past Medical History:  Diagnosis Date  . Axillary mass, left   . Cancer ([place])   . Complication of anesthesia   . Diverticulosis   . Hemorrhoids   . Hyperlipidemia   . Hypertension   . IBS (irritable bowel syndrome)   . Obesity   . PONV (postoperative nausea and vomiting)   . Pre-diabetes     Past Surgical History:  Procedure Laterality Date  . BREAST LUMPECTOMY WITH RADIOACTIVE SEED AND SENTINEL LYMPH NODE BIOPSY Bilateral 10/05/2019   Procedure: BILATERAL BREAST LUMPECTOMY WITH BILATERAL RADIOACTIVE SEEDS AND LEFT SENTINEL LYMPH NODE BIOPSY;  Surgeon: Coralie Keens, MD;  Location: [place];  Service: General;  Laterality: Bilateral;  . CESAREAN SECTION     2 times  . CHOLECYSTECTOMY     laproscopic  . CHOLECYSTECTOMY, LAPAROSCOPIC    . kidney stone removed  2011  . MASS EXCISION Left 07/12/2019   Procedure: EXCISION LEFT AXILLARY MASS;  Surgeon: Coralie Keens, MD;  Location: [place];  Service: General;  Laterality: Left;  . RE-EXCISION OF BREAST LUMPECTOMY Left 10/05/2019   Procedure: RE-EXCISION OF LEFT BREAST CANCER;  Surgeon: Coralie Keens, MD;  Location: [place];  Service: General;  Laterality: Left;  . THYROGLOSSAL  DUCT CYST  2000  . TONSILLECTOMY  8502  . umbilical herniorrhapy    . VAGINAL HYSTERECTOMY  2003    There were no vitals filed for this visit.   Subjective Assessment - 07/13/20 1103    Subjective Pt. indicated having tightness today in Rt posterior thigh/buttock region, rated at 7-8/10 range.  Started this morning insidious.    Pertinent History PMH: breast CA with lumpectomy 09/2019,IBS,HTN,    How long can you stand comfortably? depends    How long can you walk comfortably? depends    Patient Stated Goals strengthen her hip    Currently in Pain? Yes    Pain Score 8     Pain Location Leg    Pain Orientation Right    Pain Descriptors / Indicators Tightness    Pain Type Chronic pain    Pain Onset More than a month ago    Pain Frequency Intermittent    Aggravating Factors  insidious change this morning                             [facility] Adult PT Treatment/Exercise - 07/13/20 0001      Neuro Re-ed    Neuro Re-ed Details  tandem stance 1 min x 2 bilateral, retro step x 15 bilateral      Knee/Hip Exercises: Stretches  Active Hamstring Stretch Both;3 reps;30 seconds   seated c strap   Piriformis Stretch 3 reps;30 seconds    Piriformis Stretch Limitations seated      Knee/Hip Exercises: Aerobic   Nustep Lvl 5 10 mins      Knee/Hip Exercises: Seated   Other Seated Knee/Hip Exercises sciatic nerve flossing LAQ 3 x 10 Rt LE c 3 lbs                    PT Short Term Goals - 07/11/20 1046      PT SHORT TERM GOAL #1   Title Pt will be Ind and compliant with initial HEP.    Time 4    Period Weeks    Status On-going    Target Date 07/07/20      PT SHORT TERM GOAL #2   Title Pt will reduce pain overall 25% in Rt hip.    Baseline met 9/21    Status Achieved             PT Long Term Goals - 07/11/20 1046      PT LONG TERM GOAL #1   Title Pt will reduce pain overall 50% in Rt hip and Rt shoulder    Baseline met most of the time now on 9/21      Time 8    Period Weeks    Status On-going      PT LONG TERM GOAL #2   Title Pt will improve overall Rt hip strength to 4+/5 and Rt knee strength to 5/5 tested in sitting    Baseline now 4+    Time 8    Period Weeks    Status On-going      PT LONG TERM GOAL #3   Title Pt will improve TUG to less than 14 seconds    Baseline 16.8 on 9/28    Time 8    Period Weeks    Status On-going      PT LONG TERM GOAL #4   Title Pt will improve 5TSTS to less than 15 seconds    Status On-going                 Plan - 07/13/20 1122    Clinical Impression Statement Tightness complaints improved c mobiity intervention within clinic activity today, promoting improved symptoms in area compared to arrival.  Fatigue still noted in activity overall, fair performance on static balance intervention.    Personal Factors and Comorbidities Comorbidity 3+    Comorbidities PMH: breast CA with lumpectomy 09/2019,IBS,HTN,    Examination-Activity Limitations Carry;Squat;Stairs;Stand;Lift;Locomotion Level;Reach Overhead;Transfers    Examination-Participation Restrictions Church;Cleaning;Community Activity;Driving;Laundry;Shop    Stability/Clinical Decision Making Evolving/Moderate complexity    Rehab Potential Good    PT Frequency 2x / week   1-2   PT Duration 8 weeks    PT Treatment/Interventions ADLs/Self Care Home Management;Cryotherapy;Electrical Stimulation;Iontophoresis 29m/ml Dexamethasone;Moist Heat;Ultrasound;Traction;Gait training;Stair training;Therapeutic activities;Therapeutic exercise;Balance training;Neuromuscular re-education;Manual techniques;Passive range of motion;Dry needling;Joint Manipulations;Taping;Vasopneumatic Device    PT Next Visit Plan Balance, strengthening    PT Home Exercise Plan Access Code: [email or web address]    Consulted and Agree with Plan of Care Patient           Patient will benefit from skilled therapeutic intervention in order to improve the following deficits and  impairments:  Abnormal gait, Decreased activity tolerance, Decreased balance, Decreased mobility, Decreased endurance, Decreased range of motion, Decreased strength, Difficulty walking, Hypomobility, Impaired flexibility, Increased muscle spasms, Improper body mechanics, Postural dysfunction,  Obesity, Pain  Visit Diagnosis: Pain in right hip  Muscle weakness (generalized)  Other abnormalities of gait and mobility  Chronic right shoulder pain     Problem List Patient Active Problem List   Diagnosis Date Noted  . Dysuria 05/16/2020  . Right knee pain 05/16/2020  . Carcinoma of upper-outer quadrant of left breast in female, estrogen receptor positive ([place]) 08/25/2019  . Bilateral breast cancer ([place]) 08/17/2019  . Skin nodule 05/11/2019  . B12 deficiency 05/11/2019  . Vitamin D deficiency 05/11/2019  . Acute lymphadenitis 07/16/2017  . Eustachian tube dysfunction, bilateral 07/16/2017  . 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 Sudden right hearing loss 03/25/2017  . Left otitis media 03/25/2017  . Rash 11/07/2015  . Abnormal mammogram 09/13/2015  . Blister of toe of left foot 04/13/2015  . Breast discharge 04/13/2015  . Wellness examination 08/06/2011  . Microscopic hematuria 04/27/2010  . ANXIETY 03/25/2009  . PARESTHESIA 03/23/2009  . PERIPHERAL EDEMA 03/23/2009  . HEMORRHOIDS, INTERNAL 02/13/2008  . DIVERTICULOSIS, COLON 02/13/2008  . IBS 02/13/2008  . CHOLECYSTECTOMY, LAPAROSCOPIC, HX OF 02/13/2008  . OBESITY NOS 07/07/2007  . Diabetes ([place]) 07/06/2007  . Hyperlipidemia 07/06/2007  . Essential hypertension 07/06/2007   Scot Jun, PT, DPT, OCS, ATC 07/13/20  11:34 AM    [facility] Physical Therapy [address] [place], [place], [address] Phone: [phone]   Fax:  [phone]  Name: Connie Marshall MRN: [id number] Date of Birth: 01-15-1946

## 2020-07-18 ENCOUNTER — Other Ambulatory Visit: Payer: Self-pay

## 2020-07-18 ENCOUNTER — Ambulatory Visit (INDEPENDENT_AMBULATORY_CARE_PROVIDER_SITE_OTHER): Payer: Medicare Other | Admitting: Physical Therapy

## 2020-07-18 DIAGNOSIS — M25511 Pain in right shoulder: Secondary | ICD-10-CM

## 2020-07-18 DIAGNOSIS — M6281 Muscle weakness (generalized): Secondary | ICD-10-CM | POA: Diagnosis not present

## 2020-07-18 DIAGNOSIS — M25551 Pain in right hip: Secondary | ICD-10-CM

## 2020-07-18 DIAGNOSIS — R2689 Other abnormalities of gait and mobility: Secondary | ICD-10-CM | POA: Diagnosis not present

## 2020-07-18 DIAGNOSIS — G8929 Other chronic pain: Secondary | ICD-10-CM

## 2020-07-18 NOTE — Therapy (Signed)
[facility] Physical Therapy [address] [place], [place], [address] Phone: [phone]   Fax:  [phone]  Physical Therapy Treatment  Patient Details  Name: Connie Marshall MRN: [id number] Date of Birth: 1945-11-17 Referring Provider (PT): Dr. Lynne Leader   Encounter Date: 07/18/2020   PT End of Session - 07/18/20 1059    Visit Number 9    Number of Visits 15    Date for PT Re-Evaluation 08/04/20    Authorization Type UHC MCR    Progress Note Due on Visit 10    PT Start Time 1015    PT Stop Time 1100    PT Time Calculation (min) 45 min    Activity Tolerance Patient tolerated treatment well    Behavior During Therapy [facility] for tasks assessed/performed           Past Medical History:  Diagnosis Date  . Axillary mass, left   . Cancer ([place])   . Complication of anesthesia   . Diverticulosis   . Hemorrhoids   . Hyperlipidemia   . Hypertension   . IBS (irritable bowel syndrome)   . Obesity   . PONV (postoperative nausea and vomiting)   . Pre-diabetes     Past Surgical History:  Procedure Laterality Date  . BREAST LUMPECTOMY WITH RADIOACTIVE SEED AND SENTINEL LYMPH NODE BIOPSY Bilateral 10/05/2019   Procedure: BILATERAL BREAST LUMPECTOMY WITH BILATERAL RADIOACTIVE SEEDS AND LEFT SENTINEL LYMPH NODE BIOPSY;  Surgeon: Coralie Keens, MD;  Location: [place];  Service: General;  Laterality: Bilateral;  . CESAREAN SECTION     2 times  . CHOLECYSTECTOMY     laproscopic  . CHOLECYSTECTOMY, LAPAROSCOPIC    . kidney stone removed  2011  . MASS EXCISION Left 07/12/2019   Procedure: EXCISION LEFT AXILLARY MASS;  Surgeon: Coralie Keens, MD;  Location: [place];  Service: General;  Laterality: Left;  . RE-EXCISION OF BREAST LUMPECTOMY Left 10/05/2019   Procedure: RE-EXCISION OF LEFT BREAST CANCER;  Surgeon: Coralie Keens, MD;  Location: [place];  Service: General;  Laterality: Left;  . THYROGLOSSAL  DUCT CYST  2000  . TONSILLECTOMY  0177  . umbilical herniorrhapy    . VAGINAL HYSTERECTOMY  2003    There were no vitals filed for this visit.   Subjective Assessment - 07/18/20 1056    Subjective relays some tightness, had some pain this morning but with stretches and heat and massage this improved    Pertinent History PMH: breast CA with lumpectomy 09/2019,IBS,HTN,    How long can you stand comfortably? depends    How long can you walk comfortably? depends    Patient Stated Goals strengthen her hip    Pain Onset More than a month ago            [facility] Adult PT Treatment/Exercise - 07/18/20 0001      Knee/Hip Exercises: Stretches   Active Hamstring Stretch Right;2 reps;30 seconds    Piriformis Stretch Right;3 reps;Left    Piriformis Stretch Limitations seated      Knee/Hip Exercises: Aerobic   Recumbent Bike 8 min L 1      Knee/Hip Exercises: Standing   Hip Flexion Both;20 reps    Hip Flexion Limitations 3 lbs    Hip Abduction Both;20 reps    Abduction Limitations 3 lbs    Hip Extension Both;20 reps;Knee straight    Extension Limitations 3lbs      Knee/Hip Exercises: Seated   Long Arc Quad Both;3 sets;10  reps    Long Arc Quad Weight 3 lbs.    Other Seated Knee/Hip Exercises sciatic nerve flossing LAQ 2X10    Sit to Sand without UE support;15 reps   22 inch table     Knee/Hip Exercises: Supine   Bridges 3 sets;10 reps    Straight Leg Raises 2 sets;10 reps;Both    Other Supine Knee/Hip Exercises supine green clam shell 2 x 15 bilateral      Moist Heat Therapy   Number Minutes Moist Heat 7 Minutes    Moist Heat Location Hip                    PT Short Term Goals - 07/11/20 1046      PT SHORT TERM GOAL #1   Title Pt will be Ind and compliant with initial HEP.    Time 4    Period Weeks    Status On-going    Target Date 07/07/20      PT SHORT TERM GOAL #2   Title Pt will reduce pain overall 25% in Rt hip.    Baseline met 9/21    Status Achieved               PT Long Term Goals - 07/11/20 1046      PT LONG TERM GOAL #1   Title Pt will reduce pain overall 50% in Rt hip and Rt shoulder    Baseline met most of the time now on 9/21    Time 8    Period Weeks    Status On-going      PT LONG TERM GOAL #2   Title Pt will improve overall Rt hip strength to 4+/5 and Rt knee strength to 5/5 tested in sitting    Baseline now 4+    Time 8    Period Weeks    Status On-going      PT LONG TERM GOAL #3   Title Pt will improve TUG to less than 14 seconds    Baseline 16.8 on 9/28    Time 8    Period Weeks    Status On-going      PT LONG TERM GOAL #4   Title Pt will improve 5TSTS to less than 15 seconds    Status On-going                 Plan - 07/18/20 1100    Clinical Impression Statement She has one more visit scheduled currently and this will be visit 10 so PT will reassess and do 10th visit progress note to determine the need to continue PT vs DC to HEP.    Personal Factors and Comorbidities Comorbidity 3+    Comorbidities PMH: breast CA with lumpectomy 09/2019,IBS,HTN,    Examination-Activity Limitations Carry;Squat;Stairs;Stand;Lift;Locomotion Level;Reach Overhead;Transfers    Examination-Participation Restrictions Church;Cleaning;Community Activity;Driving;Laundry;Shop    Stability/Clinical Decision Making Evolving/Moderate complexity    Rehab Potential Good    PT Frequency 2x / week   1-2   PT Duration 8 weeks    PT Treatment/Interventions ADLs/Self Care Home Management;Cryotherapy;Electrical Stimulation;Iontophoresis 45m/ml Dexamethasone;Moist Heat;Ultrasound;Traction;Gait training;Stair training;Therapeutic activities;Therapeutic exercise;Balance training;Neuromuscular re-education;Manual techniques;Passive range of motion;Dry needling;Joint Manipulations;Taping;Vasopneumatic Device    PT Next Visit Plan Balance, strengthening    PT Home Exercise Plan Access Code: [email or web address]    Consulted and Agree with Plan of Care  Patient           Patient will benefit from skilled therapeutic intervention in order to improve the following  deficits and impairments:  Abnormal gait, Decreased activity tolerance, Decreased balance, Decreased mobility, Decreased endurance, Decreased range of motion, Decreased strength, Difficulty walking, Hypomobility, Impaired flexibility, Increased muscle spasms, Improper body mechanics, Postural dysfunction, Obesity, Pain  Visit Diagnosis: Pain in right hip  Muscle weakness (generalized)  Other abnormalities of gait and mobility  Chronic right shoulder pain     Problem List Patient Active Problem List   Diagnosis Date Noted  . Dysuria 05/16/2020  . Right knee pain 05/16/2020  . Carcinoma of upper-outer quadrant of left breast in female, estrogen receptor positive ([place]) 08/25/2019  . Bilateral breast cancer ([place]) 08/17/2019  . Skin nodule 05/11/2019  . B12 deficiency 05/11/2019  . Vitamin D deficiency 05/11/2019  . Acute lymphadenitis 07/16/2017  . Eustachian tube dysfunction, bilateral 07/16/2017  . 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 Sudden right hearing loss 03/25/2017  . Left otitis media 03/25/2017  . Rash 11/07/2015  . Abnormal mammogram 09/13/2015  . Blister of toe of left foot 04/13/2015  . Breast discharge 04/13/2015  . Wellness examination 08/06/2011  . Microscopic hematuria 04/27/2010  . ANXIETY 03/25/2009  . PARESTHESIA 03/23/2009  . PERIPHERAL EDEMA 03/23/2009  . HEMORRHOIDS, INTERNAL 02/13/2008  . DIVERTICULOSIS, COLON 02/13/2008  . IBS 02/13/2008  . CHOLECYSTECTOMY, LAPAROSCOPIC, HX OF 02/13/2008  . OBESITY NOS 07/07/2007  . Diabetes ([place]) 07/06/2007  . Hyperlipidemia 07/06/2007  . Essential hypertension 07/06/2007    Silvestre Mesi 07/18/2020, 11:01 AM  [facility] Physical Therapy [address] [place], [place], [address] Phone: [phone]   Fax:  [phone]  Name: Connie Marshall MRN:  [id number] Date of Birth: [birth year]

## 2020-07-20 ENCOUNTER — Ambulatory Visit (INDEPENDENT_AMBULATORY_CARE_PROVIDER_SITE_OTHER): Payer: Medicare Other | Admitting: Physical Therapy

## 2020-07-20 ENCOUNTER — Other Ambulatory Visit: Payer: Self-pay

## 2020-07-20 ENCOUNTER — Encounter: Payer: Self-pay | Admitting: Physical Therapy

## 2020-07-20 DIAGNOSIS — R2689 Other abnormalities of gait and mobility: Secondary | ICD-10-CM | POA: Diagnosis not present

## 2020-07-20 DIAGNOSIS — M25551 Pain in right hip: Secondary | ICD-10-CM | POA: Diagnosis not present

## 2020-07-20 DIAGNOSIS — M6281 Muscle weakness (generalized): Secondary | ICD-10-CM | POA: Diagnosis not present

## 2020-07-20 NOTE — Therapy (Signed)
[facility] Physical Therapy [address] [place], [place], [address] Phone: [phone]   Fax:  [phone]  Physical Therapy Treatment/Discharge PHYSICAL THERAPY DISCHARGE SUMMARY  Visits from Start of Care: 10  Current functional level related to goals / functional outcomes: See below, back to baseline   Remaining deficits: Mild hip weakness   Education / Equipment: HEP Plan: Patient agrees to discharge.  Patient goals were met. Patient is being discharged due to being pleased with the current functional level.  ?????       Patient Details  Name: Connie Marshall MRN: [id number] Date of Birth: 18-Aug-1946 Referring Provider (PT): Dr. Lynne Leader   Encounter Date: 07/20/2020   PT End of Session - 07/20/20 1123    Visit Number 10    Number of Visits 15    Date for PT Re-Evaluation 08/04/20    Authorization Type UHC MCR    Progress Note Due on Visit 10    PT Start Time 1100    PT Stop Time 1138    PT Time Calculation (min) 38 min    Activity Tolerance Patient tolerated treatment well    Behavior During Therapy [facility] for tasks assessed/performed           Past Medical History:  Diagnosis Date  . Axillary mass, left   . Cancer ([place])   . Complication of anesthesia   . Diverticulosis   . Hemorrhoids   . Hyperlipidemia   . Hypertension   . IBS (irritable bowel syndrome)   . Obesity   . PONV (postoperative nausea and vomiting)   . Pre-diabetes     Past Surgical History:  Procedure Laterality Date  . BREAST LUMPECTOMY WITH RADIOACTIVE SEED AND SENTINEL LYMPH NODE BIOPSY Bilateral 10/05/2019   Procedure: BILATERAL BREAST LUMPECTOMY WITH BILATERAL RADIOACTIVE SEEDS AND LEFT SENTINEL LYMPH NODE BIOPSY;  Surgeon: Coralie Keens, MD;  Location: [place];  Service: General;  Laterality: Bilateral;  . CESAREAN SECTION     2 times  . CHOLECYSTECTOMY     laproscopic  . CHOLECYSTECTOMY, LAPAROSCOPIC    . kidney stone removed  2011    . MASS EXCISION Left 07/12/2019   Procedure: EXCISION LEFT AXILLARY MASS;  Surgeon: Coralie Keens, MD;  Location: [place];  Service: General;  Laterality: Left;  . RE-EXCISION OF BREAST LUMPECTOMY Left 10/05/2019   Procedure: RE-EXCISION OF LEFT BREAST CANCER;  Surgeon: Coralie Keens, MD;  Location: [place];  Service: General;  Laterality: Left;  . THYROGLOSSAL DUCT CYST  2000  . TONSILLECTOMY  6237  . umbilical herniorrhapy    . VAGINAL HYSTERECTOMY  2003    There were no vitals filed for this visit.   Subjective Assessment - 07/20/20 1123    Subjective relays her hip feels good, it no longer hurts, feels ready for discharge today    Pertinent History PMH: breast CA with lumpectomy 09/2019,IBS,HTN,    How long can you stand comfortably? depends    How long can you walk comfortably? depends    Patient Stated Goals strengthen her hip    Pain Onset More than a month ago              [facility] PT Assessment - 07/20/20 0001      Assessment   Medical Diagnosis Rt hip pain, trochanteric bursiitis, impingement/RTC tendonopathy Rt shoulder    Referring Provider (PT) Dr. Lynne Leader      Strength   Overall Strength Comments tested in sitting  Right Hip Flexion 4+/5    Right Hip ABduction 4+/5    Left Hip Flexion 4+/5    Left Hip ABduction 4+/5    Right Knee Flexion 5/5    Right Knee Extension 5/5    Left Knee Flexion 5/5    Left Knee Extension 5/5                         [facility] Adult PT Treatment/Exercise - 07/20/20 0001      Knee/Hip Exercises: Stretches   Piriformis Stretch Right;3 reps;Left    Piriformis Stretch Limitations seated      Knee/Hip Exercises: Aerobic   Recumbent Bike 8 min L 1      Knee/Hip Exercises: Standing   Hip Flexion Both;20 reps    Hip Flexion Limitations 3 lbs    Hip Abduction Both;20 reps    Abduction Limitations 3 lbs    Other Standing Knee Exercises lateral walking with green band 2  steps over and 2 steps back X 5 reps      Knee/Hip Exercises: Seated   Long Arc Quad Both;3 sets;10 reps    Clamshell with Marga Hoots   2X15, then reverse clams 2X15   Sit to Sand without UE support;15 reps                    PT Short Term Goals - 07/20/20 1125      PT SHORT TERM GOAL #1   Title Pt will be Ind and compliant with initial HEP.    Time 4    Period Weeks    Status Achieved    Target Date 07/07/20      PT SHORT TERM GOAL #2   Title Pt will reduce pain overall 25% in Rt hip.    Baseline met 9/21    Status Achieved             PT Long Term Goals - 07/20/20 1125      PT LONG TERM GOAL #1   Title Pt will reduce pain overall 50% in Rt hip and Rt shoulder    Time 8    Period Weeks    Status Achieved      PT LONG TERM GOAL #2   Title Pt will improve overall Rt hip strength to 4+/5 and Rt knee strength to 5/5 tested in sitting    Baseline now 4+ hip and 5 knee    Time 8    Period Weeks    Status Achieved      PT LONG TERM GOAL #3   Title Pt will improve TUG to less than 14 seconds    Baseline 16.8 on 9/28    Time 8    Period Weeks    Status Partially Met      PT LONG TERM GOAL #4   Title Pt will improve 5TSTS to less than 15 seconds    Baseline now 14.5 sec    Status Achieved                 Plan - 07/20/20 1138    Clinical Impression Statement she feels ready for Discharge and PT agrees. Gave her resistance band and added more hip strength into HEP and she had good understanding of this. She has met all but one PT goal. She had no further questions or concerns about discharge.    Personal Factors and Comorbidities Comorbidity 3+    Comorbidities PMH: breast CA  with lumpectomy 09/2019,IBS,HTN,    Examination-Activity Limitations Carry;Squat;Stairs;Stand;Lift;Locomotion Level;Reach Overhead;Transfers    Examination-Participation Restrictions Church;Cleaning;Community Activity;Driving;Laundry;Shop    Stability/Clinical Decision  Making Evolving/Moderate complexity    Rehab Potential Good    PT Frequency 2x / week   1-2   PT Duration 8 weeks    PT Treatment/Interventions ADLs/Self Care Home Management;Cryotherapy;Electrical Stimulation;Iontophoresis 77m/ml Dexamethasone;Moist Heat;Ultrasound;Traction;Gait training;Stair training;Therapeutic activities;Therapeutic exercise;Balance training;Neuromuscular re-education;Manual techniques;Passive range of motion;Dry needling;Joint Manipulations;Taping;Vasopneumatic Device    PT Next Visit Plan DC today    PT Home Exercise Plan Access Code: [email or web address]    Consulted and Agree with Plan of Care Patient           Patient will benefit from skilled therapeutic intervention in order to improve the following deficits and impairments:  Abnormal gait, Decreased activity tolerance, Decreased balance, Decreased mobility, Decreased endurance, Decreased range of motion, Decreased strength, Difficulty walking, Hypomobility, Impaired flexibility, Increased muscle spasms, Improper body mechanics, Postural dysfunction, Obesity, Pain  Visit Diagnosis: Pain in right hip  Muscle weakness (generalized)  Other abnormalities of gait and mobility     Problem List Patient Active Problem List   Diagnosis Date Noted  . Dysuria 05/16/2020  . Right knee pain 05/16/2020  . Carcinoma of upper-outer quadrant of left breast in female, estrogen receptor positive ([place] 08/25/2019  . [place] 08/17/2019  . Skin nodule 05/11/2019  . B12 deficiency 05/11/2019  . Vitamin D deficiency 05/11/2019  . Acute lymphadenitis 07/16/2017  . Eustachian tube dysfunction, bilateral 07/16/2017  . 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 Sudden right hearing loss 03/25/2017  . Left otitis media 03/25/2017  . Rash 11/07/2015  . Abnormal mammogram 09/13/2015  . Blister of toe of left foot 04/13/2015  . Breast discharge 04/13/2015  . Wellness examination 08/06/2011  . Microscopic  hematuria 04/27/2010  . ANXIETY 03/25/2009  . PARESTHESIA 03/23/2009  . PERIPHERAL EDEMA 03/23/2009  . HEMORRHOIDS, INTERNAL 02/13/2008  . DIVERTICULOSIS, COLON 02/13/2008  . IBS 02/13/2008  . CHOLECYSTECTOMY, LAPAROSCOPIC, HX OF 02/13/2008  . OBESITY NOS 07/07/2007  . Diabetes ([place] 07/06/2007  . Hyperlipidemia 07/06/2007  . Essential hypertension 07/06/2007    BSilvestre Mesi10/04/2020, 11:40 AM  [facility] [address] [place] [address]Phone: [phone]  Fax:  [phone] Name: LKorra ChristineMRN: [id number] of Birth: [birth year]

## 2020-08-12 ENCOUNTER — Ambulatory Visit (INDEPENDENT_AMBULATORY_CARE_PROVIDER_SITE_OTHER): Payer: Medicare Other

## 2020-08-12 DIAGNOSIS — Z23 Encounter for immunization: Secondary | ICD-10-CM

## 2020-10-13 ENCOUNTER — Encounter: Payer: Self-pay | Admitting: Hematology and Oncology

## 2020-11-07 ENCOUNTER — Telehealth: Payer: Self-pay | Admitting: Internal Medicine

## 2020-11-07 DIAGNOSIS — M810 Age-related osteoporosis without current pathological fracture: Secondary | ICD-10-CM

## 2020-11-07 NOTE — Telephone Encounter (Signed)
[place] ordered  Staff to assist pt with appt at the [facility] site to have this done please

## 2020-11-07 NOTE — Telephone Encounter (Signed)
Patient was wondering if she could get a bone density scan. Please advise.

## 2020-11-08 NOTE — Telephone Encounter (Signed)
Patient has been scheduled for Dexa scan.

## 2020-11-09 ENCOUNTER — Other Ambulatory Visit: Payer: Self-pay

## 2020-11-09 ENCOUNTER — Ambulatory Visit (INDEPENDENT_AMBULATORY_CARE_PROVIDER_SITE_OTHER)
Admission: RE | Admit: 2020-11-09 | Discharge: 2020-11-09 | Disposition: A | Discharge status: Home / Self Care | Payer: Medicare Other | Source: Ambulatory Visit | Attending: Internal Medicine | Admitting: Internal Medicine

## 2020-11-09 DIAGNOSIS — M810 Age-related osteoporosis without current pathological fracture: Secondary | ICD-10-CM

## 2020-11-12 ENCOUNTER — Encounter: Payer: Self-pay | Admitting: Internal Medicine

## 2020-12-22 ENCOUNTER — Telehealth: Payer: Self-pay | Admitting: Internal Medicine

## 2020-12-22 ENCOUNTER — Other Ambulatory Visit: Payer: Self-pay | Admitting: Hematology and Oncology

## 2020-12-22 DIAGNOSIS — E538 Deficiency of other specified B group vitamins: Secondary | ICD-10-CM

## 2020-12-22 DIAGNOSIS — E119 Type 2 diabetes mellitus without complications: Secondary | ICD-10-CM

## 2020-12-22 DIAGNOSIS — E559 Vitamin D deficiency, unspecified: Secondary | ICD-10-CM

## 2020-12-22 NOTE — Telephone Encounter (Signed)
Patient called to schedule her physical but asked to have her labs done prior to the appointment so that they can be discussed during the appointment

## 2020-12-22 NOTE — Telephone Encounter (Signed)
Ok labs ordered 

## 2020-12-22 NOTE — Telephone Encounter (Signed)
Patient notified

## 2020-12-25 ENCOUNTER — Other Ambulatory Visit: Payer: Self-pay

## 2020-12-25 MED ORDER — SIMVASTATIN 20 MG PO TABS: 20.0000 mg | ORAL_TABLET | Freq: Every evening | ORAL | 3 refills | Status: DC

## 2020-12-25 MED ORDER — FOSINOPRIL SODIUM 20 MG PO TABS: 20.0000 mg | ORAL_TABLET | Freq: Every day | ORAL | 3 refills | Status: DC

## 2020-12-25 NOTE — Addendum Note (Signed)
Addended by: Elza Rafter D on: 12/25/2020 04:34 PM   Modules accepted: Orders

## 2021-01-02 ENCOUNTER — Other Ambulatory Visit (INDEPENDENT_AMBULATORY_CARE_PROVIDER_SITE_OTHER): Payer: Medicare Other

## 2021-01-02 ENCOUNTER — Other Ambulatory Visit: Payer: Self-pay

## 2021-01-02 DIAGNOSIS — E559 Vitamin D deficiency, unspecified: Secondary | ICD-10-CM

## 2021-01-02 DIAGNOSIS — E119 Type 2 diabetes mellitus without complications: Secondary | ICD-10-CM

## 2021-01-02 DIAGNOSIS — E538 Deficiency of other specified B group vitamins: Secondary | ICD-10-CM | POA: Diagnosis not present

## 2021-01-02 LAB — BASIC METABOLIC PANEL
BUN: 17 mg/dL (ref 6–23)
CO2: 29 mEq/L (ref 19–32)
Calcium: 10.5 mg/dL (ref 8.4–10.5)
Chloride: 101 mEq/L (ref 96–112)
Creatinine, Ser: 0.82 mg/dL (ref 0.40–1.20)
GFR: 70.23 mL/min (ref >60.00)
Glucose, Bld: 120 mg/dL — ABNORMAL HIGH (ref 70–99)
Potassium: 4 mEq/L (ref 3.5–5.1)
Sodium: 137 mEq/L (ref 135–145)

## 2021-01-02 LAB — LIPID PANEL
Cholesterol: 121 mg/dL (ref 0–200)
HDL: 41.6 mg/dL (ref >39.00)
LDL Cholesterol: 62 mg/dL (ref 0–99)
NonHDL: 79.78
Total CHOL/HDL Ratio: 3
Triglycerides: 87 mg/dL (ref 0.0–149.0)
VLDL: 17.4 mg/dL (ref 0.0–40.0)

## 2021-01-02 LAB — CBC WITH DIFFERENTIAL/PLATELET
Basophils Absolute: 0 10*3/uL (ref 0.0–0.1)
Basophils Relative: 0.7 % (ref 0.0–3.0)
Eosinophils Absolute: 0.1 10*3/uL (ref 0.0–0.7)
Eosinophils Relative: 3.2 % (ref 0.0–5.0)
HCT: 38.3 % (ref 36.0–46.0)
Hemoglobin: 13.1 g/dL (ref 12.0–15.0)
Lymphocytes Relative: 40 % (ref 12.0–46.0)
Lymphs Abs: 1 10*3/uL (ref 0.7–4.0)
MCHC: 34.1 g/dL (ref 30.0–36.0)
MCV: 87.2 fl (ref 78.0–100.0)
Monocytes Absolute: 0.3 10*3/uL (ref 0.1–1.0)
Monocytes Relative: 10.6 % (ref 3.0–12.0)
Neutro Abs: 1.2 10*3/uL — ABNORMAL LOW (ref 1.4–7.7)
Neutrophils Relative %: 45.5 % (ref 43.0–77.0)
Platelets: 192 10*3/uL (ref 150.0–400.0)
RBC: 4.39 Mil/uL (ref 3.87–5.11)
RDW: 12.7 % (ref 11.5–15.5)
WBC: 2.6 10*3/uL — ABNORMAL LOW (ref 4.0–10.5)

## 2021-01-02 LAB — HEMOGLOBIN A1C: Hgb A1c MFr Bld: 6.2 % (ref 4.6–6.5)

## 2021-01-02 LAB — URINALYSIS, ROUTINE W REFLEX MICROSCOPIC
Bilirubin Urine: NEGATIVE
Hgb urine dipstick: NEGATIVE
Ketones, ur: NEGATIVE
Nitrite: NEGATIVE
RBC / HPF: NONE SEEN (ref 0–?)
Specific Gravity, Urine: 1.02 (ref 1.000–1.030)
Total Protein, Urine: NEGATIVE
Urine Glucose: NEGATIVE
Urobilinogen, UA: 0.2 (ref 0.0–1.0)
pH: 5.5 (ref 5.0–8.0)

## 2021-01-02 LAB — HEPATIC FUNCTION PANEL
ALT: 11 U/L (ref 0–35)
AST: 16 U/L (ref 0–37)
Albumin: 4.5 g/dL (ref 3.5–5.2)
Alkaline Phosphatase: 61 U/L (ref 39–117)
Bilirubin, Direct: 0.2 mg/dL (ref 0.0–0.3)
Total Bilirubin: 0.6 mg/dL (ref 0.2–1.2)
Total Protein: 7.1 g/dL (ref 6.0–8.3)

## 2021-01-02 LAB — MICROALBUMIN / CREATININE URINE RATIO
Creatinine,U: 104 mg/dL
Microalb Creat Ratio: 0.8 mg/g (ref 0.0–30.0)
Microalb, Ur: 0.8 mg/dL (ref 0.0–1.9)

## 2021-01-02 LAB — VITAMIN B12: Vitamin B-12: 491 pg/mL (ref 211–911)

## 2021-01-02 LAB — VITAMIN D 25 HYDROXY (VIT D DEFICIENCY, FRACTURES): VITD: 35.49 ng/mL (ref 30.00–100.00)

## 2021-01-02 LAB — TSH: TSH: 2.18 u[IU]/mL (ref 0.35–4.50)

## 2021-01-08 ENCOUNTER — Ambulatory Visit (INDEPENDENT_AMBULATORY_CARE_PROVIDER_SITE_OTHER): Payer: Medicare Other

## 2021-01-08 DIAGNOSIS — Z0000 Encounter for general adult medical examination without abnormal findings: Secondary | ICD-10-CM

## 2021-01-08 NOTE — Progress Notes (Signed)
I connected with Connie Marshall today by telephone and verified that I am speaking with the correct person using two identifiers. Location patient: home Location provider: work Persons participating in the virtual visit: Connie Marshall and Connie Abu, LPN.   I discussed the limitations, risks, security and privacy concerns of performing an evaluation and management service by telephone and the availability of in person appointments. I also discussed with the patient that there may be a patient responsible charge related to this service. The patient expressed understanding and verbally consented to this telephonic visit.    Interactive audio and video telecommunications were attempted between this provider and patient, however failed, due to patient having technical difficulties OR patient did not have access to video capability.  We continued and completed visit with audio only.  Some vital signs may be absent or patient reported.   Time Spent with patient on telephone encounter: 30 minutes  Subjective:   Connie Marshall is a 75 y.o. female who presents for Medicare Annual (Subsequent) preventive examination.  Review of Systems    No ROS. Medicare Wellness Visit. Additional risk factors are reflected in social history. Cardiac Risk Factors include: advanced age (>67men, >18 women);dyslipidemia;hypertension;family history of premature cardiovascular disease     Objective:    There were no vitals filed for this visit. There is no height or weight on file to calculate BMI.  Advanced Directives 01/08/2021 10/27/2019 10/05/2019 09/22/2019 08/25/2019 07/12/2019 07/05/2019  Does Patient Have a Medical Advance Directive? Yes Yes Yes Yes Yes Yes Yes  Type of Advance Directive Living will Living will Living will Living will Living will Living will Living will  Does patient want to make changes to medical advance directive? No - Patient declined - No - Patient declined No - Patient declined - No -  Guardian declined No - Patient declined  Copy of [place] in Chart? - - - - - No - copy requested No - copy requested  Would patient like information on creating a medical advance directive? - - - - - - -    Current Medications (verified) Outpatient Encounter Medications as of 01/08/2021  Medication Sig  . anastrozole (ARIMIDEX) 1 MG tablet TAKE 1 TABLET(1 MG) BY MOUTH DAILY  . B Complex-C (SUPER B COMPLEX PO) Take 1 capsule by mouth daily.  . Cholecalciferol (VITAMIN D3 SUPER STRENGTH) 50 MCG (2000 UT) TABS Take 1 tablet by mouth daily.  . fosinopril (MONOPRIL) 20 MG tablet Take 1 tablet (20 mg total) by mouth daily.  . furosemide (LASIX) 40 MG tablet 1 tab by mouth in the AM, and 1 in the PM as needed for leg swelling  . Misc Natural Products (TURMERIC CURCUMIN) CAPS Take 1 capsule by mouth daily. Take for immune system  . simvastatin (ZOCOR) 20 MG tablet Take 1 tablet (20 mg total) by mouth every evening.  . [DISCONTINUED] NON FORMULARY [employer] Back [employer] Body extra strength-Take prn  . [DISCONTINUED] traMADol (ULTRAM) 50 MG tablet Take 1 tablet (50 mg total) by mouth every 6 (six) hours as needed.   No facility-administered encounter medications on file as of 01/08/2021.    Allergies (verified) Tape   History: Past Medical History:  Diagnosis Date  . Axillary mass, left   . Cancer ([place])   . Complication of anesthesia   . Diverticulosis   . Hemorrhoids   . Hyperlipidemia   . Hypertension   . IBS (irritable bowel syndrome)   . Obesity   . PONV (postoperative  nausea and vomiting)   . Pre-diabetes    Past Surgical History:  Procedure Laterality Date  . BREAST LUMPECTOMY WITH RADIOACTIVE SEED AND SENTINEL LYMPH NODE BIOPSY Bilateral 10/05/2019   Procedure: BILATERAL BREAST LUMPECTOMY WITH BILATERAL RADIOACTIVE SEEDS AND LEFT SENTINEL LYMPH NODE BIOPSY;  Surgeon: Coralie Keens, MD;  Location: [facility];  Service: General;  Laterality:  Bilateral;  . CESAREAN SECTION     2 times  . CHOLECYSTECTOMY     laproscopic  . CHOLECYSTECTOMY, LAPAROSCOPIC    . kidney stone removed  2011  . MASS EXCISION Left 07/12/2019   Procedure: EXCISION LEFT AXILLARY MASS;  Surgeon: Coralie Keens, MD;  Location: [facility];  Service: General;  Laterality: Left;  . RE-EXCISION OF BREAST LUMPECTOMY Left 10/05/2019   Procedure: RE-EXCISION OF LEFT BREAST CANCER;  Surgeon: Coralie Keens, MD;  Location: [facility];  Service: General;  Laterality: Left;  . THYROGLOSSAL DUCT CYST  2000  . TONSILLECTOMY  8756  . umbilical herniorrhapy    . VAGINAL HYSTERECTOMY  2003   Family History  Problem Relation Age of Onset  . Hypertension Mother   . Diabetes Father   . Hypertension Father   . Parkinson's disease Sister   . Prostate cancer Brother    Social History   Socioeconomic History  . Marital status: Widowed    Spouse name: Not on file  . Number of children: 2  . Years of education: Not on file  . Highest education level: Not on file  Occupational History  . Occupation: Retired  Tobacco Use  . Smoking status: Never Smoker  . Smokeless tobacco: Never Used  Vaping Use  . Vaping Use: Never used  Substance and Sexual Activity  . Alcohol use: No  . Drug use: No  . Sexual activity: Not Currently  Other Topics Concern  . Not on file  Social History Narrative   HSG, [employer] for 2 years. Married, '19-2 years- widowed; '76- 17 years/ widowed '93. 1 son- '77, 1 daughter- '78, 4 grandchildren. 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 Lives alone - I- ADLS.   Social Determinants of Health   Financial Resource Strain: Low Risk   . Difficulty of Paying Living Expenses: Not hard at all  Food Insecurity: No Food Insecurity  . Worried About Charity fundraiser in the Last Year: Never true  . Ran Out of Food in the Last Year: Never true  Transportation Needs: No Transportation Needs  .  Lack of Transportation (Medical): No  . Lack of Transportation (Non-Medical): No  Physical Activity: Sufficiently Active  . Days of Exercise per Week: 7 days  . Minutes of Exercise per Session: 50 min  Stress: No Stress Concern Present  . Feeling of Stress : Not at all  Social Connections: Moderately Integrated  . Frequency of Communication with Friends and Family: More than three times a week  . Frequency of Social Gatherings with Friends and Family: Once a week  . Attends Religious Services: More than 4 times per year  . Active Member of Clubs or Organizations: No  . Attends Archivist Meetings: More than 4 times per year  . Marital Status: Widowed    Tobacco Counseling Counseling given: Not Answered   Clinical Intake:  Pre-visit preparation completed: Yes  Pain : No/denies pain     Nutritional Risks: None Diabetes: No  How often do you need to have someone help you when you read instructions,  pamphlets, or other written materials from your doctor or pharmacy?: 1 - Never What is the last grade level you completed in school?: 2 years at [employer]  Diabetic? no  Interpreter Needed?: No  Information entered by :: Connie Abu, LPN   Activities of Daily Living In your present state of health, do you have any difficulty performing the following activities: 01/08/2021  Hearing? N  Vision? N  Difficulty concentrating or making decisions? N  Walking or climbing stairs? N  Dressing or bathing? N  Doing errands, shopping? N  Preparing Food and eating ? N  Using the Toilet? N  In the past six months, have you accidently leaked urine? N  Do you have problems with loss of bowel control? N  Managing your Medications? N  Managing your Finances? N  Housekeeping or managing your Housekeeping? N  Some recent data might be hidden    Patient Care Team: [name], MD as PCP - General (Internal Medicine) Kathie Rhodes, MD (Inactive) (Urology) Dyke Maes, [place] (Optometry) Lafayette Dragon, MD (Inactive) (Gastroenterology) Thornell Sartorius, MD (Otolaryngology) Mauro Kaufmann, RN as Oncology Nurse Navigator Rockwell Germany, RN as Oncology Nurse Navigator  Indicate any recent Medical Services you may have received from other than [facility] providers in the past year (date may be approximate).     Assessment:   This is a routine wellness examination for [place].  Hearing/Vision screen No exam data present  Dietary issues and exercise activities discussed: Current Exercise Habits: Home exercise routine, Type of exercise: stretching;strength training/weights;walking, Time (Minutes): 40, Frequency (Times/Week): 7, Weekly Exercise (Minutes/Week): 280, Intensity: Moderate, Exercise limited by: None identified  Goals    . Patient Stated     Maintain current health status.     . Patient Stated     To get back in the gym and continue to be independent and physically active.      Depression Screen PHQ 2/9 Scores 01/08/2021 11/23/2019 08/30/2019 03/15/2019 09/23/2018 09/29/2017 09/20/2016  PHQ - 2 Score 0 0 0 0 0 0 0    Fall Risk Fall Risk  01/08/2021 11/23/2019 08/30/2019 03/15/2019 09/23/2018  Falls in the past year? 0 0 0 0 0  Number falls in past yr: 0 - 0 - -  Injury with Fall? 0 - 0 - -  Risk for fall due to : No Fall Risks - - - -  Follow up Falls evaluation completed - - - -    FALL RISK PREVENTION PERTAINING TO THE HOME:  Any stairs in or around the home? No  If so, are there any without handrails? No  Home free of loose throw rugs in walkways, pet beds, electrical cords, etc? Yes  Adequate lighting in your home to reduce risk of falls? Yes   ASSISTIVE DEVICES UTILIZED TO PREVENT FALLS:  Life alert? No  Use of a cane, walker or w/c? No  Grab bars in the bathroom? Yes  Shower chair or bench in shower? No  Elevated toilet seat or a handicapped toilet? Yes   TIMED UP AND GO:  Was the test performed? No .  Length of time to  ambulate 10 feet: 0 sec.   Gait steady and fast without use of assistive device  Cognitive Function: Patient is cogitatively intact.        Immunizations Immunization History  Administered Date(s) Administered  . Fluad Quad(high Dose 65+) 07/05/2019, 08/12/2020  . Influenza Split 08/05/2011, 08/20/2012  . Influenza Whole 07/18/2009  .  Influenza, High Dose Seasonal PF 09/06/2013, 09/13/2015, 08/12/2016, 07/16/2017, 07/21/2018  . Influenza,inj,Quad PF,6+ Mos 09/07/2014  . PFIZER(Purple Top)SARS-COV-2 Vaccination 11/18/2019, 12/09/2019, 09/29/2020  . Pneumococcal Conjugate-13 09/27/2013  . Pneumococcal Polysaccharide-23 08/05/2011  . Td 10/15/2006  . Tdap 09/29/2017  . Zoster 08/02/2010    TDAP status: Up to date  Flu Vaccine status: Up to date  Pneumococcal vaccine status: Up to date  Covid-19 vaccine status: Completed vaccines  Qualifies for Shingles Vaccine? Yes   Zostavax completed Yes   Shingrix Completed?: No.    Education has been provided regarding the importance of this vaccine. Patient has been advised to call insurance company to determine out of pocket expense if they have not yet received this vaccine. Advised may also receive vaccine at local pharmacy or Health Dept. Verbalized acceptance and understanding.  Screening Tests Health Maintenance  Topic Date Due  . FOOT EXAM  11/22/2020  . COVID-19 Vaccine (4 - Booster for Pfizer series) 03/30/2021  . OPHTHALMOLOGY EXAM  05/31/2021  . HEMOGLOBIN A1C  07/05/2021  . MAMMOGRAM  10/13/2022  . COLONOSCOPY (Pts 45-69yrs Insurance coverage will need to be confirmed)  06/10/2024  . TETANUS/TDAP  09/30/2027  . INFLUENZA VACCINE  Completed  . DEXA SCAN  Completed  . Hepatitis C Screening  Completed  . PNA vac Low Risk Adult  Completed  . HPV VACCINES  Aged Out    Health Maintenance  Health Maintenance Due  Topic Date Due  . FOOT EXAM  11/22/2020    Colorectal cancer screening: Type of screening: Colonoscopy.  Completed 06/10/2014. Repeat every 10 years  Mammogram status: Completed 10/13/2020. Repeat every year  Bone Density status: Completed 11/09/2020. Results reflect: Bone density results: NORMAL. Repeat every 0 years.  Lung Cancer Screening: (Low Dose CT Chest recommended if Age 46-80 years, 30 pack-year currently smoking OR have quit w/in 15years.) does not qualify.   Lung Cancer Screening Referral: no  Additional Screening:  Hepatitis C Screening: does qualify; Completed yes  Vision Screening: Recommended annual ophthalmology exams for early detection of glaucoma and other disorders of the eye. Is the patient up to date with their annual eye exam?  Yes  Who is the provider or what is the name of the office in which the patient attends annual eye exams? [facility] [place], OD. If pt is not established with a provider, would they like to be referred to a provider to establish care? No .   Dental Screening: Recommended annual dental exams for proper oral hygiene  Community Resource Referral / Chronic Care Management: CRR required this visit?  Yes   CCM required this visit?  No      Plan:     I have personally reviewed and noted the following in the patient's chart:   . Medical and social history . Use of alcohol, tobacco or illicit drugs  . Current medications and supplements . Functional ability and status . Nutritional status . Physical activity . Advanced directives . List of other physicians . Hospitalizations, surgeries, and ER visits in previous 12 months . Vitals . Screenings to include cognitive, depression, and falls . Referrals and appointments  In addition, I have reviewed and discussed with patient certain preventive protocols, quality metrics, and best practice recommendations. A written personalized care plan for preventive services as well as general preventive health recommendations were provided to patient.     Sheral Flow, LPN   [id number]   Nurse  Notes:  There were no vitals filed for this visit.  There is no height or weight on file to calculate BMI. Patient stated that she has no issues with gait or balance; does not use any assistive devices. Medications reviewed with patient; no opioid use noted.

## 2021-01-08 NOTE — Patient Instructions (Signed)
Connie Marshall , Thank you for taking time to come for your Medicare Wellness Visit. I appreciate your ongoing commitment to your health goals. Please review the following plan we discussed and let me know if I can assist you in the future.   Screening recommendations/referrals: Colonoscopy: 06/10/2014; due every 10 years Mammogram: 10/13/2020 Bone Density: 11/09/2020 (completed) Recommended yearly ophthalmology/optometry visit for glaucoma screening and checkup Recommended yearly dental visit for hygiene and checkup  Vaccinations: Influenza vaccine: 08/12/2020 Pneumococcal vaccine: 08/05/2011, 09/27/2013 Tdap vaccine: 09/29/2017; due every 10 years Shingles vaccine: never done; can check with local pharmacy for cost.   Covid-19: 11/18/2019, 12/09/2019, 09/29/2020  Advanced directives: Please bring a copy of your health care power of attorney and living will to the office at your convenience.  Conditions/risks identified: Yes; Reviewed health maintenance screenings with patient today and relevant education, vaccines, and/or referrals were provided. Please continue to do your personal lifestyle choices by: daily care of teeth and gums, regular physical activity (goal should be 5 days a week for 30 minutes), eat a healthy diet, avoid tobacco and drug use, limiting any alcohol intake, taking a low-dose aspirin (if not allergic or have been advised by your provider otherwise) and taking vitamins and minerals as recommended by your provider. Continue doing brain stimulating activities (puzzles, reading, adult coloring books, staying active) to keep memory sharp. Continue to eat heart healthy diet (full of fruits, vegetables, whole grains, lean protein, water--limit salt, fat, and sugar intake) and increase physical activity as tolerated.  Next appointment: Please schedule your next Medicare Wellness Visit with your Nurse Health Advisor in 1 year by calling [phone].   Preventive Care 32 Years and Older,  Female Preventive care refers to lifestyle choices and visits with your health care provider that can promote health and wellness. What does preventive care include?  A yearly physical exam. This is also called an annual well check.  Dental exams once or twice a year.  Routine eye exams. Ask your health care provider how often you should have your eyes checked.  Personal lifestyle choices, including:  Daily care of your teeth and gums.  Regular physical activity.  Eating a healthy diet.  Avoiding tobacco and drug use.  Limiting alcohol use.  Practicing safe sex.  Taking low-dose aspirin every day.  Taking vitamin and mineral supplements as recommended by your health care provider. What happens during an annual well check? The services and screenings done by your health care provider during your annual well check will depend on your age, overall health, lifestyle risk factors, and family history of disease. Counseling  Your health care provider may ask you questions about your:  Alcohol use.  Tobacco use.  Drug use.  Emotional well-being.  Home and relationship well-being.  Sexual activity.  Eating habits.  History of falls.  Memory and ability to understand (cognition).  Work and work Statistician.  Reproductive health. Screening  You may have the following tests or measurements:  Height, weight, and BMI.  Blood pressure.  Lipid and cholesterol levels. These may be checked every 5 years, or more frequently if you are over 16 years old.  Skin check.  Lung cancer screening. You may have this screening every year starting at age 61 if you have a 30-pack-year history of smoking and currently smoke or have quit within the past 15 years.  Fecal occult blood test (FOBT) of the stool. You may have this test every year starting at age 43.  Flexible sigmoidoscopy or colonoscopy.  You may have a sigmoidoscopy every 5 years or a colonoscopy every 10 years  starting at age 61.  Hepatitis C blood test.  Hepatitis B blood test.  Sexually transmitted disease (STD) testing.  Diabetes screening. This is done by checking your blood sugar (glucose) after you have not eaten for a while (fasting). You may have this done every 1-3 years.  Bone density scan. This is done to screen for osteoporosis. You may have this done starting at age 77.  Mammogram. This may be done every 1-2 years. Talk to your health care provider about how often you should have regular mammograms. Talk with your health care provider about your test results, treatment options, and if necessary, the need for more tests. Vaccines  Your health care provider may recommend certain vaccines, such as:  Influenza vaccine. This is recommended every year.  Tetanus, diphtheria, and acellular pertussis (Tdap, Td) vaccine. You may need a Td booster every 10 years.  Zoster vaccine. You may need this after age 73.  Pneumococcal 13-valent conjugate (PCV13) vaccine. One dose is recommended after age 62.  Pneumococcal polysaccharide (PPSV23) vaccine. One dose is recommended after age 63. Talk to your health care provider about which screenings and vaccines you need and how often you need them. This information is not intended to replace advice given to you by your health care provider. Make sure you discuss any questions you have with your health care provider. Document Released: 10/27/2015 Document Revised: 06/19/2016 Document Reviewed: 08/01/2015 Elsevier Interactive Patient Education  2017 [place] Prevention in the Home Falls can cause injuries. They can happen to people of all ages. There are many things you can do to make your home safe and to help prevent falls. What can I do on the outside of my home?  Regularly fix the edges of walkways and driveways and fix any cracks.  Remove anything that might make you trip as you walk through a door, such as a raised step or  threshold.  Trim any bushes or trees on the path to your home.  Use bright outdoor lighting.  Clear any walking paths of anything that might make someone trip, such as rocks or tools.  Regularly check to see if handrails are loose or broken. Make sure that both sides of any steps have handrails.  Any raised decks and porches should have guardrails on the edges.  Have any leaves, snow, or ice cleared regularly.  Use sand or salt on walking paths during winter.  Clean up any spills in your garage right away. This includes oil or grease spills. What can I do in the bathroom?  Use night lights.  Install grab bars by the toilet and in the tub and shower. Do not use towel bars as grab bars.  Use non-skid mats or decals in the tub or shower.  If you need to sit down in the shower, use a plastic, non-slip stool.  Keep the floor dry. Clean up any water that spills on the floor as soon as it happens.  Remove soap buildup in the tub or shower regularly.  Attach bath mats securely with double-sided non-slip rug tape.  Do not have throw rugs and other things on the floor that can make you trip. What can I do in the bedroom?  Use night lights.  Make sure that you have a light by your bed that is easy to reach.  Do not use any sheets or blankets that are too big  for your bed. They should not hang down onto the floor.  Have a firm chair that has side arms. You can use this for support while you get dressed.  Do not have throw rugs and other things on the floor that can make you trip. What can I do in the kitchen?  Clean up any spills right away.  Avoid walking on wet floors.  Keep items that you use a lot in easy-to-reach places.  If you need to reach something above you, use a strong step stool that has a grab bar.  Keep electrical cords out of the way.  Do not use floor polish or wax that makes floors slippery. If you must use wax, use non-skid floor wax.  Do not have  throw rugs and other things on the floor that can make you trip. What can I do with my stairs?  Do not leave any items on the stairs.  Make sure that there are handrails on both sides of the stairs and use them. Fix handrails that are broken or loose. Make sure that handrails are as long as the stairways.  Check any carpeting to make sure that it is firmly attached to the stairs. Fix any carpet that is loose or worn.  Avoid having throw rugs at the top or bottom of the stairs. If you do have throw rugs, attach them to the floor with carpet tape.  Make sure that you have a light switch at the top of the stairs and the bottom of the stairs. If you do not have them, ask someone to add them for you. What else can I do to help prevent falls?  Wear shoes that:  Do not have high heels.  Have rubber bottoms.  Are comfortable and fit you well.  Are closed at the toe. Do not wear sandals.  If you use a stepladder:  Make sure that it is fully opened. Do not climb a closed stepladder.  Make sure that both sides of the stepladder are locked into place.  Ask someone to hold it for you, if possible.  Clearly mark and make sure that you can see:  Any grab bars or handrails.  First and last steps.  Where the edge of each step is.  Use tools that help you move around (mobility aids) if they are needed. These include:  Canes.  Walkers.  Scooters.  Crutches.  Turn on the lights when you go into a dark area. Replace any light bulbs as soon as they burn out.  Set up your furniture so you have a clear path. Avoid moving your furniture around.  If any of your floors are uneven, fix them.  If there are any pets around you, be aware of where they are.  Review your medicines with your doctor. Some medicines can make you feel dizzy. This can increase your chance of falling. Ask your doctor what other things that you can do to help prevent falls. This information is not intended to  replace advice given to you by your health care provider. Make sure you discuss any questions you have with your health care provider. Document Released: 07/27/2009 Document Revised: 03/07/2016 Document Reviewed: 11/04/2014 Elsevier Interactive Patient Education  2017 [employer].

## 2021-01-09 ENCOUNTER — Ambulatory Visit (INDEPENDENT_AMBULATORY_CARE_PROVIDER_SITE_OTHER): Payer: Medicare Other | Admitting: Internal Medicine

## 2021-01-09 ENCOUNTER — Other Ambulatory Visit: Payer: Self-pay

## 2021-01-09 ENCOUNTER — Encounter: Payer: Self-pay | Admitting: Internal Medicine

## 2021-01-09 VITALS — BP 126/74 | HR 78 | Temp 98.2°F | Ht 69.0 in | Wt 229.0 lb

## 2021-01-09 DIAGNOSIS — E559 Vitamin D deficiency, unspecified: Secondary | ICD-10-CM | POA: Diagnosis not present

## 2021-01-09 DIAGNOSIS — I10 Essential (primary) hypertension: Secondary | ICD-10-CM | POA: Diagnosis not present

## 2021-01-09 DIAGNOSIS — Z0000 Encounter for general adult medical examination without abnormal findings: Secondary | ICD-10-CM

## 2021-01-09 DIAGNOSIS — E7800 Pure hypercholesterolemia, unspecified: Secondary | ICD-10-CM | POA: Diagnosis not present

## 2021-01-09 DIAGNOSIS — E1165 Type 2 diabetes mellitus with hyperglycemia: Secondary | ICD-10-CM

## 2021-01-09 NOTE — Patient Instructions (Addendum)
Please continue all other medications as before, and refills have been done if requested.  Please have the pharmacy call with any other refills you may need.  Please continue your efforts at being more active, low cholesterol diet, and weight control.  You are otherwise up to date with prevention measures today.  Please keep your appointments with your specialists as you may have planned  Please make an Appointment to return in 6 months, or sooner if needed, also with Lab Appointment for testing done 3-5 days before at the FIRST FLOOR Lab (so this is for TWO appointments - please see the scheduling desk as you leave)  Due to the ongoing Covid 19 pandemic, our lab now requires an appointment for any labs done at our office.  If you need labs done and do not have an appointment, please call our office ahead of time to schedule before presenting to the lab for your testing.  

## 2021-01-09 NOTE — Progress Notes (Signed)
Patient ID: Connie Marshall, female   DOB: 13-Nov-1945, 75 y.o.   MRN: [id number]         Chief Complaint:: wellness exam       HPI:  Connie Marshall is a 75 y.o. female here for wellness exam; already up to date with preventive referrals and immunizations                    Also, Pt denies chest pain, increased sob or doe, wheezing, orthopnea, PND, increased LE swelling, palpitations, dizziness or syncope.  Denies new focal neuro s/s.   Pt denies polydipsia, polyuria,  Pt denies fever, wt loss, night sweats, loss of appetite, or other constitutional symptoms  No new complaints.   Wt Readings from Last 3 Encounters:  01/09/21 229 lb (103.9 kg)  05/22/20 245 lb 9.6 oz (111.4 kg)  05/16/20 239 lb (108.4 kg)   BP Readings from Last 3 Encounters:  01/09/21 126/74  05/22/20 140/74  05/16/20 130/62   Immunization History  Administered Date(s) Administered  . Fluad Quad(high Dose 65+) 07/05/2019, 08/12/2020  . Influenza Split 08/05/2011, 08/20/2012  . Influenza Whole 07/18/2009  . Influenza, High Dose Seasonal PF 09/06/2013, 09/13/2015, 08/12/2016, 07/16/2017, 07/21/2018  . Influenza,inj,Quad PF,6+ Mos 09/07/2014  . PFIZER(Purple Top)SARS-COV-2 Vaccination 11/18/2019, 12/09/2019, 09/29/2020  . Pneumococcal Conjugate-13 09/27/2013  . Pneumococcal Polysaccharide-23 08/05/2011  . Td 10/15/2006  . Tdap 09/29/2017  . Zoster 08/02/2010  There are no preventive care reminders to display for this patient.    Past Medical History:  Diagnosis Date  . Axillary mass, left   . Cancer ([place])   . Complication of anesthesia   . Diverticulosis   . Hemorrhoids   . Hyperlipidemia   . Hypertension   . IBS (irritable bowel syndrome)   . Obesity   . PONV (postoperative nausea and vomiting)   . Pre-diabetes    Past Surgical History:  Procedure Laterality Date  . BREAST LUMPECTOMY WITH RADIOACTIVE SEED AND SENTINEL LYMPH NODE BIOPSY Bilateral 10/05/2019   Procedure: BILATERAL BREAST LUMPECTOMY WITH  BILATERAL RADIOACTIVE SEEDS AND LEFT SENTINEL LYMPH NODE BIOPSY;  Surgeon: Coralie Keens, MD;  Location: [facility];  Service: General;  Laterality: Bilateral;  . CESAREAN SECTION     2 times  . CHOLECYSTECTOMY     laproscopic  . CHOLECYSTECTOMY, LAPAROSCOPIC    . kidney stone removed  2011  . MASS EXCISION Left 07/12/2019   Procedure: EXCISION LEFT AXILLARY MASS;  Surgeon: Coralie Keens, MD;  Location: [facility];  Service: General;  Laterality: Left;  . RE-EXCISION OF BREAST LUMPECTOMY Left 10/05/2019   Procedure: RE-EXCISION OF LEFT BREAST CANCER;  Surgeon: Coralie Keens, MD;  Location: [facility];  Service: General;  Laterality: Left;  . THYROGLOSSAL DUCT CYST  2000  . TONSILLECTOMY  0938  . umbilical herniorrhapy    . VAGINAL HYSTERECTOMY  2003    reports that she has never smoked. She has never used smokeless tobacco. She reports that she does not drink alcohol and does not use drugs. family history includes Diabetes in her father; Hypertension in her father and mother; Parkinson's disease in her sister; Prostate cancer in her brother. Allergies  Allergen Reactions  . Tape     ALLERGIC TO NON-ALLERGIC TAPE-causes a rash   Current Outpatient Medications on File Prior to Visit  Medication Sig Dispense Refill  . anastrozole (ARIMIDEX) 1 MG tablet TAKE 1 TABLET(1 MG) BY MOUTH DAILY 90 tablet 3  . B Complex-C (  SUPER B COMPLEX PO) Take 1 capsule by mouth daily.    . Cholecalciferol (VITAMIN D3 SUPER STRENGTH) 50 MCG (2000 UT) TABS Take 1 tablet by mouth daily.    . fosinopril (MONOPRIL) 20 MG tablet Take 1 tablet (20 mg total) by mouth daily. 90 tablet 3  . furosemide (LASIX) 40 MG tablet 1 tab by mouth in the AM, and 1 in the PM as needed for leg swelling 180 tablet 3  . Misc Natural Products (TURMERIC CURCUMIN) CAPS Take 1 capsule by mouth daily. Take for immune system    . simvastatin (ZOCOR) 20 MG tablet Take 1 tablet (20 mg  total) by mouth every evening. 90 tablet 3   No current facility-administered medications on file prior to visit.        ROS:  All others reviewed and negative.  Objective        PE:  BP 126/74 (BP Location: Left Arm, Patient Position: Sitting, Cuff Size: Large)   Pulse 78   Temp 98.2 F (36.8 C) (Oral)   Ht 5\' 9"  (1.753 m)   Wt 229 lb (103.9 kg)   SpO2 99%   BMI 33.82 kg/m                 Constitutional: Pt appears in NAD               HENT: Head: NCAT.                Right Ear: External ear normal.                 Left Ear: External ear normal.                Eyes: . Pupils are equal, round, and reactive to light. Conjunctivae and EOM are normal               Nose: without d/c or deformity               Neck: Neck supple. Gross normal ROM               Cardiovascular: Normal rate and regular rhythm.                 Pulmonary/Chest: Effort normal and breath sounds without rales or wheezing.                Abd:  Soft, NT, ND, + BS, no organomegaly               Neurological: Pt is alert. At baseline orientation, motor grossly intact               Skin: Skin is warm. No rashes, no other new lesions, LE edema - none               Psychiatric: Pt behavior is normal without agitation   Micro: none  Cardiac tracings I have personally interpreted today:  none  Pertinent Radiological findings (summarize): none   Lab Results  Component Value Date   WBC 2.6 (L) 01/02/2021   HGB 13.1 01/02/2021   HCT 38.3 01/02/2021   PLT 192.0 01/02/2021   GLUCOSE 120 (H) 01/02/2021   CHOL 121 01/02/2021   TRIG 87.0 01/02/2021   HDL 41.60 01/02/2021   LDLDIRECT 115.2 09/26/2008   LDLCALC 62 01/02/2021   ALT 11 01/02/2021   AST 16 01/02/2021   NA 137 01/02/2021   K 4.0 01/02/2021   CL 101 01/02/2021  CREATININE 0.82 01/02/2021   BUN 17 01/02/2021   CO2 29 01/02/2021   TSH 2.18 01/02/2021   HGBA1C 6.2 01/02/2021   MICROALBUR 0.8 01/02/2021   Assessment/Plan:  Connie Marshall is a 75  y.o. Black or African American [2] female with  has a past medical history of Axillary mass, left, Cancer ([place]), Complication of anesthesia, Diverticulosis, Hemorrhoids, Hyperlipidemia, Hypertension, IBS (irritable bowel syndrome), Obesity, PONV (postoperative nausea and vomiting), and Pre-diabetes.  Wellness examination Age and sex appropriate education and counseling updated with regular exercise and diet Referrals for preventative services - none needed Immunizations addressed - none needed Smoking counseling  - none needed Evidence for depression or other mood disorder - none significant Most recent labs reviewed. I have personally reviewed and have noted: 1) the patient's medical and social history 2) The patient's current medications and supplements 3) The patient's height, weight, and BMI have been recorded in the chart   Vitamin D deficiency Last vitamin D Lab Results  Component Value Date   VD25OH 35.49 01/02/2021   Stable, cont oral replacement   Hyperlipidemia Lab Results  Component Value Date   LDLCALC 62 01/02/2021   Stable, pt to continue current statin zocor 20   Essential hypertension BP Readings from Last 3 Encounters:  01/09/21 126/74  05/22/20 140/74  05/16/20 130/62   Stable, pt to continue medical treatment fosinopril   Diabetes Lab Results  Component Value Date   HGBA1C 6.2 01/02/2021   Stable, pt to continue current medical treatment  - diet   Followup: Return in about 6 months (around 07/12/2021).  [name], MD 01/16/2021 9:04 PM [facility]

## 2021-01-16 ENCOUNTER — Encounter: Payer: Self-pay | Admitting: Internal Medicine

## 2021-01-16 MED ORDER — SIMVASTATIN 20 MG PO TABS: 20.0000 mg | ORAL_TABLET | Freq: Every evening | ORAL | 3 refills | Status: DC

## 2021-01-16 MED ORDER — FUROSEMIDE 40 MG PO TABS: ORAL_TABLET | ORAL | 3 refills | Status: DC

## 2021-01-16 MED ORDER — FOSINOPRIL SODIUM 20 MG PO TABS: 20.0000 mg | ORAL_TABLET | Freq: Every day | ORAL | 3 refills | Status: DC

## 2021-01-16 NOTE — Assessment & Plan Note (Signed)
Lab Results  Component Value Date   LDLCALC 62 01/02/2021   Stable, pt to continue current statin zocor 20

## 2021-01-16 NOTE — Assessment & Plan Note (Signed)

## 2021-01-16 NOTE — Assessment & Plan Note (Signed)
Last vitamin D Lab Results  Component Value Date   VD25OH 35.49 01/02/2021   Stable, cont oral replacement

## 2021-01-16 NOTE — Assessment & Plan Note (Signed)
BP Readings from Last 3 Encounters:  01/09/21 126/74  05/22/20 140/74  05/16/20 130/62   Stable, pt to continue medical treatment fosinopril

## 2021-01-16 NOTE — Assessment & Plan Note (Signed)
Lab Results  Component Value Date   HGBA1C 6.2 01/02/2021   Stable, pt to continue current medical treatment  - diet

## 2021-02-19 ENCOUNTER — Other Ambulatory Visit: Payer: Self-pay

## 2021-02-19 ENCOUNTER — Encounter: Payer: Self-pay | Admitting: Internal Medicine

## 2021-02-19 ENCOUNTER — Ambulatory Visit (INDEPENDENT_AMBULATORY_CARE_PROVIDER_SITE_OTHER): Payer: Medicare Other | Admitting: Internal Medicine

## 2021-02-19 VITALS — BP 148/76 | HR 73 | Temp 98.1°F | Ht 69.0 in | Wt 237.0 lb

## 2021-02-19 DIAGNOSIS — B029 Zoster without complications: Secondary | ICD-10-CM

## 2021-02-19 DIAGNOSIS — W57XXXA Bitten or stung by nonvenomous insect and other nonvenomous arthropods, initial encounter: Secondary | ICD-10-CM | POA: Diagnosis not present

## 2021-02-19 DIAGNOSIS — S0006XA Insect bite (nonvenomous) of scalp, initial encounter: Secondary | ICD-10-CM | POA: Diagnosis not present

## 2021-02-19 DIAGNOSIS — I10 Essential (primary) hypertension: Secondary | ICD-10-CM

## 2021-02-19 HISTORY — DX: Zoster without complications: B02.9

## 2021-02-19 MED ORDER — AZITHROMYCIN 250 MG PO TABS: ORAL_TABLET | ORAL | 0 refills | Status: AC

## 2021-02-19 MED ORDER — GABAPENTIN 300 MG PO CAPS: 300.0000 mg | ORAL_CAPSULE | Freq: Every evening | ORAL | 1 refills | Status: DC | PRN

## 2021-02-19 MED ORDER — HYDROCODONE-ACETAMINOPHEN 5-325 MG PO TABS: 1.0000 | ORAL_TABLET | Freq: Four times a day (QID) | ORAL | 0 refills | Status: DC | PRN

## 2021-02-19 MED ORDER — VALACYCLOVIR HCL 1 G PO TABS: 1000.0000 mg | ORAL_TABLET | Freq: Three times a day (TID) | ORAL | 0 refills | Status: AC

## 2021-02-19 NOTE — Patient Instructions (Signed)
You may have an infected insect bite, as well as shingles outbreak  Please take all new medication as prescribed - the antibiotic zpack, then antibiotic valtrex, hydrocodone as needed for pain, and gabapentin 300 mg at bedtime for pain if needed as well  Please continue all other medications as before, and refills have been done if requested.  Please have the pharmacy call with any other refills you may need.  Please keep your appointments with your specialists as you may have planned

## 2021-02-19 NOTE — Progress Notes (Signed)
Patient ID: Connie Marshall, female   DOB: [birth year], 75 y.o.   MRN: [id number]        Chief Complaint: new rash to right neck       HPI:  Connie Marshall is a 75 y.o. female here with above rash that seemed to start and most tender painful at the right upper post cervical and wraps around the right neck submandibular in a dermatomal fashion; without fever, chills. Or hx of same.  No trauma, falls.  Pt states she feels had an insect bite at the right occiput area that seemed to start the whole thing with redness, tender, swelling worse in a localized area for the last few days.  Pt denies chest pain, increased sob or doe, wheezing, orthopnea, PND, increased LE swelling, palpitations, dizziness or syncope.   Pt denies polydipsia, polyuria, or new focal neuro s/s.   Pt denies fever, wt loss, night sweats, loss of appetite, or other constitutional symptoms        Wt Readings from Last 3 Encounters:  02/19/21 237 lb (107.5 kg)  01/09/21 229 lb (103.9 kg)  05/22/20 245 lb 9.6 oz (111.4 kg)   BP Readings from Last 3 Encounters:  02/19/21 (!) 148/76  01/09/21 126/74  05/22/20 140/74         Past Medical History:  Diagnosis Date  . Axillary mass, left   . Cancer ([place])   . Complication of anesthesia   . Diverticulosis   . Hemorrhoids   . Hyperlipidemia   . Hypertension   . IBS (irritable bowel syndrome)   . Obesity   . PONV (postoperative nausea and vomiting)   . Pre-diabetes    Past Surgical History:  Procedure Laterality Date  . BREAST LUMPECTOMY WITH RADIOACTIVE SEED AND SENTINEL LYMPH NODE BIOPSY Bilateral 10/05/2019   Procedure: BILATERAL BREAST LUMPECTOMY WITH BILATERAL RADIOACTIVE SEEDS AND LEFT SENTINEL LYMPH NODE BIOPSY;  Surgeon: Coralie Keens, MD;  Location: [facility];  Service: General;  Laterality: Bilateral;  . CESAREAN SECTION     2 times  . CHOLECYSTECTOMY     laproscopic  . CHOLECYSTECTOMY, LAPAROSCOPIC    . kidney stone removed  2011  . MASS EXCISION Left  07/12/2019   Procedure: EXCISION LEFT AXILLARY MASS;  Surgeon: Coralie Keens, MD;  Location: [facility];  Service: General;  Laterality: Left;  . RE-EXCISION OF BREAST LUMPECTOMY Left 10/05/2019   Procedure: RE-EXCISION OF LEFT BREAST CANCER;  Surgeon: Coralie Keens, MD;  Location: [facility];  Service: General;  Laterality: Left;  . THYROGLOSSAL DUCT CYST  2000  . TONSILLECTOMY  7322  . umbilical herniorrhapy    . VAGINAL HYSTERECTOMY  2003    reports that she has never smoked. She has never used smokeless tobacco. She reports that she does not drink alcohol and does not use drugs. family history includes Diabetes in her father; Hypertension in her father and mother; Parkinson's disease in her sister; Prostate cancer in her brother. Allergies  Allergen Reactions  . Tape     ALLERGIC TO NON-ALLERGIC TAPE-causes a rash   Current Outpatient Medications on File Prior to Visit  Medication Sig Dispense Refill  . anastrozole (ARIMIDEX) 1 MG tablet TAKE 1 TABLET(1 MG) BY MOUTH DAILY 90 tablet 3  . B Complex-C (SUPER B COMPLEX PO) Take 1 capsule by mouth daily.    . Cholecalciferol (VITAMIN D3 SUPER STRENGTH) 50 MCG (2000 UT) TABS Take 1 tablet by mouth daily.    . fosinopril (  MONOPRIL) 20 MG tablet Take 1 tablet (20 mg total) by mouth daily. 90 tablet 3  . furosemide (LASIX) 40 MG tablet 1 tab by mouth in the AM, and 1 in the PM as needed for leg swelling 180 tablet 3  . Misc Natural Products (TURMERIC CURCUMIN) CAPS Take 1 capsule by mouth daily. Take for immune system    . simvastatin (ZOCOR) 20 MG tablet Take 1 tablet (20 mg total) by mouth every evening. 90 tablet 3   No current facility-administered medications on file prior to visit.        ROS:  All others reviewed and negative.  Objective        PE:  BP (!) 148/76 (BP Location: Left Arm, Patient Position: Sitting, Cuff Size: Large)   Pulse 73   Temp 98.1 F (36.7 C) (Oral)   Ht 5\' 9"  (1.753 m)    Wt 237 lb (107.5 kg)   SpO2 96%   BMI 35.00 kg/m                 Constitutional: Pt appears in NAD               HENT: Head: NCAT.                Right Ear: External ear normal.                 Left Ear: External ear normal.                Eyes: . Pupils are equal, round, and reactive to light. Conjunctivae and EOM are normal               Nose: without d/c or deformity               Neck: Neck supple. Gross normal ROM               Cardiovascular: Normal rate and regular rhythm.                 Pulmonary/Chest: Effort normal and breath sounds without rales or wheezing.                Abd:  Soft, NT, ND, + BS, no organomegaly               Neurological: Pt is alert. At baseline orientation, motor grossly intact               Skin: LE edema - none, right occiput with 1.5 cm are red, tender swelling, and right upper cervical area rash with grouped vesicles on an erythem base starting right post cervical and wrapped around the right neck to the submental area               Psychiatric: Pt behavior is normal without agitation   Micro: none  Cardiac tracings I have personally interpreted today:  none  Pertinent Radiological findings (summarize): none   Lab Results  Component Value Date   WBC 2.6 (L) 01/02/2021   HGB 13.1 01/02/2021   HCT 38.3 01/02/2021   PLT 192.0 01/02/2021   GLUCOSE 120 (H) 01/02/2021   CHOL 121 01/02/2021   TRIG 87.0 01/02/2021   HDL 41.60 01/02/2021   LDLDIRECT 115.2 09/26/2008   LDLCALC 62 01/02/2021   ALT 11 01/02/2021   AST 16 01/02/2021   NA 137 01/02/2021   K 4.0 01/02/2021   CL 101 01/02/2021   CREATININE 0.82 01/02/2021   BUN 17  01/02/2021   CO2 29 01/02/2021   TSH 2.18 01/02/2021   HGBA1C 6.2 01/02/2021   MICROALBUR 0.8 01/02/2021   Assessment/Plan:  Connie Marshall is a 75 y.o. Black or African American [2] female with  has a past medical history of Axillary mass, left, Cancer ([place]), Complication of anesthesia, Diverticulosis, Hemorrhoids,  Hyperlipidemia, Hypertension, IBS (irritable bowel syndrome), Obesity, PONV (postoperative nausea and vomiting), and Pre-diabetes.  Insect bite, infected Mild to mod, for antibx course,  to f/u any worsening symptoms or concerns  Shingles outbreak For valtrex course, hydrocodone prn, and gabapentin 300 qhs,  to f/u any worsening symptoms or concerns  Essential hypertension BP Readings from Last 3 Encounters:  02/19/21 (!) 148/76  01/09/21 126/74  05/22/20 140/74   Stable to mild increased likely reactive, pt to continue medical treatment - monopril   Followup: Return if symptoms worsen or fail to improve.  [name], MD 02/21/2021 9:47 PM [facility]

## 2021-02-21 ENCOUNTER — Encounter: Payer: Self-pay | Admitting: Internal Medicine

## 2021-02-21 NOTE — Assessment & Plan Note (Signed)
For valtrex course, hydrocodone prn, and gabapentin 300 qhs,  to f/u any worsening symptoms or concerns

## 2021-02-21 NOTE — Assessment & Plan Note (Signed)
Mild to mod, for antibx course,  to f/u any worsening symptoms or concerns 

## 2021-02-21 NOTE — Assessment & Plan Note (Signed)
BP Readings from Last 3 Encounters:  02/19/21 (!) 148/76  01/09/21 126/74  05/22/20 140/74   Stable to mild increased likely reactive, pt to continue medical treatment - monopril

## 2021-03-21 NOTE — Progress Notes (Addendum)
Patient Care Team: [name], MD as PCP - General (Internal Medicine) Kathie Rhodes, MD (Inactive) (Urology) Thornell Sartorius, MD (Otolaryngology) Mauro Kaufmann, RN as Oncology Nurse Navigator Rockwell Germany, RN as Oncology Nurse Navigator Rozetta Nunnery, MD as Consulting Physician (Otolaryngology) [place], Heather, [place] as Consulting Physician (Optometry)  DIAGNOSIS:    ICD-10-CM   1. Carcinoma of upper-outer quadrant of left breast in female, estrogen receptor positive ([place])  C50.412    Z17.0       SUMMARY OF ONCOLOGIC HISTORY: Oncology History  Bilateral breast cancer ([place])  07/12/2019 Initial Diagnosis   Left axillary soft tissue excision: Invasive ductal carcinoma lymphoid tissue not present   08/02/2019 Breast MRI   Right breast enhancing linear mass 6 x 6 x 13 mm, left breast enhancing irregular mass 5 x 15 x 5 mm, no abnormal appearing lymph nodes   08/19/2019 Cancer Staging   Staging form: Breast, AJCC 8th Edition - Clinical stage from 08/19/2019: Stage IIA (cT3, cN0, cM0, G2, ER+, PR+, HER2-) - Signed by Nicholas Lose, MD on 08/19/2019    10/05/2019 Surgery   Right lumpectomy: 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 Left breast reexcision: IDC grade 2, 1.7 cm, intermediate grade DCIS, margins clear, 0/6 lymph nodes negative, ER 95%, PR 95%, HER-2 negative, Ki-67 15%, T3N0   10/14/2019 Cancer Staging   Staging form: Breast, AJCC 8th Edition - Pathologic stage from 10/14/2019: Stage IB (pT3, pN0(sn), cM0, G2, ER+, PR+, HER2-) - Signed by Nicholas Lose, MD on 10/14/2019    11/23/2019 - 12/22/2019 Radiation Therapy   Adjuvant radiation   12/21/2019 -  Anti-estrogen oral therapy   Anastrozole, 45m daily, planned duration 5 years   Carcinoma of upper-outer quadrant of left breast in [place], estrogen receptor positive [place]  08/25/2019 Initial Diagnosis   Carcinoma of  upper-outer quadrant of left breast in female, estrogen receptor positive [place]    08/25/2019 Cancer Staging   Staging form: Breast, AJCC 8th Edition - Clinical: Stage IIA (cT3, cN0, cM0, G2, ER+, PR+, HER2-) - Signed by SEppie Gibson MD on 08/25/2019    09/02/2019 Oncotype testing   Oncotype: score of 0 with a 3% chance of distant recurrence in 9 years with antiestrogen therapy alone.    10/05/2019 Surgery   Bilateral lumpectomies ([facility]:  Right breast: intraductal papilloma and no evidence of malignancy  Left breast: IDC with DCIS, grade 2, 1.7cm, clear margins, 6 left axillary lymph nodes negative      CHIEF COMPLIANT: Follow-up of left breast cancer on anastrozole  INTERVAL HISTORY: [name] a 75y.o. with above-mentioned history of left breast cancer treated with bilateral lumpectomies, radiation, and is currently on antiestrogen therapy with anastrozole. Mammogram on 10/13/20 showed no evidence of malignancy bilaterally. She presents to the clinic today for follow-up.  She complains of osteoarthritis symptoms.  She is not able to exercise as much as she wants because of the joint stiffness and pain.  She is continuing to take the anastrozole.  She had breast exams done by Dr. BNinfa Lindenand mammograms in the end of last year both of which were negative for any recurrence.  She tells me that the breast tenderness and sensitivities have improved significantly.  ALLERGIES:  is allergic to tape.  MEDICATIONS:  Current Outpatient Medications  Medication Sig Dispense Refill   anastrozole (ARIMIDEX) 1 MG tablet TAKE 1 TABLET(1 MG) BY MOUTH DAILY 90 tablet 3   B  Complex-C (SUPER B COMPLEX PO) Take 1 capsule by mouth daily.     Cholecalciferol (VITAMIN D3 SUPER STRENGTH) 50 MCG (2000 UT) TABS Take 1 tablet by mouth daily.     fosinopril (MONOPRIL) 20 MG tablet Take 1 tablet (20 mg total) by mouth daily. 90 tablet 3   furosemide (LASIX) 40 MG tablet 1 tab by mouth in the AM, and 1  in the PM as needed for leg swelling 180 tablet 3   gabapentin (NEURONTIN) 300 MG capsule Take 1 capsule (300 mg total) by mouth at bedtime as needed. 90 capsule 1   HYDROcodone-acetaminophen (NORCO/VICODIN) 5-325 MG tablet Take 1 tablet by mouth every 6 (six) hours as needed. 30 tablet 0   Misc Natural Products (TURMERIC CURCUMIN) CAPS Take 1 capsule by mouth daily. Take for immune system     simvastatin (ZOCOR) 20 MG tablet Take 1 tablet (20 mg total) by mouth every evening. 90 tablet 3   No current facility-administered medications for this visit.    PHYSICAL EXAMINATION: ECOG PERFORMANCE STATUS: 1 - Symptomatic but completely ambulatory  Vitals:   03/22/21 0908  BP: 139/69  Pulse: 82  Resp: 18  Temp: (!) 97.5 F (36.4 C)  SpO2: 98%   Filed Weights   03/22/21 0908  Weight: 230 lb 4.8 oz (104.5 kg)     LABORATORY DATA:  I have reviewed the data as listed CMP Latest Ref Rng & Units 01/02/2021 09/30/2019 08/05/2019  Glucose 70 - 99 mg/dL 120(H) 120(H) 149(H)  BUN 6 - 23 mg/dL '17 16 10  ' Creatinine 0.40 - 1.20 mg/dL 0.82 0.82 0.80  Sodium 135 - 145 mEq/L 137 138 139  Potassium 3.5 - 5.1 mEq/L 4.0 3.6 4.1  Chloride 96 - 112 mEq/L 101 105 104  CO2 19 - 32 mEq/L '29 25 25  ' Calcium 8.4 - 10.5 mg/dL 10.5 9.9 10.2  Total Protein 6.0 - 8.3 g/dL 7.1 7.2 7.3  Total Bilirubin 0.2 - 1.2 mg/dL 0.6 0.5 0.5  Alkaline Phos 39 - 117 U/L 61 61 62  AST 0 - 37 U/L '16 14 13  ' ALT 0 - 35 U/L '11 11 9    ' Lab Results  Component Value Date   WBC 2.6 (L) 01/02/2021   HGB 13.1 01/02/2021   HCT 38.3 01/02/2021   MCV 87.2 01/02/2021   PLT 192.0 01/02/2021   NEUTROABS 1.2 (L) 01/02/2021    ASSESSMENT & PLAN:  Carcinoma of upper-outer quadrant of left breast in female, estrogen receptor positive ([place]) 07/12/2019:Left axillary soft tissue excision: Invasive ductal carcinoma lymphoid tissue not present ER/PR positive HER-2 equivocal by IHC, FISH pending Because the primary size is at least more  than 5 cm, T3N0   10/05/2019:Right lumpectomy: 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 Left breast reexcision: IDC grade 2, 1.7 cm, intermediate grade DCIS, margins clear, 0/6 lymph nodes negative, ER 95%, PR 95%, HER-2 negative, Ki-67 15%, T3N0 Stage Ib pathologic stage   Oncotype score: 0, distant recurrence at 9 years: 3% Adjuvant radiation: 11/23/2019-12/21/2019   Treatment plan: Adjuvant antiestrogen therapy with anastrozole 1 mg daily x5 years started march 2021 Anastrozole Toxicities: 1.  Diffuse muscle aches and stiffness: Takes Advil Tylenol and turmeric 2.  Hot flashes: Resolved   Breast Cancer Surveillance: Mammograms 10/13/20: Benign.   Bone Density: 11/09/20:T score -0.2  Return to clinic in 1 year for follow-up   No orders of the defined types were placed in this encounter.  The patient has a good understanding of the overall plan. she agrees with it. she will call with any problems that may develop before the next visit here.  Total time spent: 20 mins including face to face time and time spent for planning, charting and coordination of care  Rulon Eisenmenger, MD, MPH 03/22/2021  I, Cloyde Reams Dorshimer, am acting as scribe for Dr. Nicholas Lose.  I have reviewed the above documentation for accuracy and completeness, and I agree with the above.

## 2021-03-21 NOTE — Assessment & Plan Note (Signed)
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 Left breast reexcision: IDC grade 2, 1.7 cm, intermediate grade DCIS, margins clear, 0/6 lymph nodes negative, ER 95%, PR 95%, HER-2 negative, Ki-67 15%, T3N0 Stage Ib pathologic stage  Oncotype score: 0, distant recurrence at 9 years: 3% Adjuvant radiation: 11/23/2019-12/21/2019  Treatment plan: Adjuvant antiestrogen therapy with anastrozole 1 mg daily x5 years started march 2021 Anastrozole Toxicities: 1.  Diffuse muscle aches and stiffness: I instructed her to take turmeric and also she will take Advil for the aches and pains. 2.  Hot flashes mild to moderate  Breast Cancer Surveillance: Mammograms 10/13/20: Benign.   Bone Density: 11/09/20:T score -0.2 Return to clinic in 1 year for follow-up

## 2021-03-22 ENCOUNTER — Inpatient Hospital Stay: Payer: Medicare Other | Attending: Hematology and Oncology | Admitting: Hematology and Oncology

## 2021-03-22 ENCOUNTER — Other Ambulatory Visit: Payer: Self-pay

## 2021-03-22 DIAGNOSIS — Z923 Personal history of irradiation: Secondary | ICD-10-CM | POA: Diagnosis not present

## 2021-03-22 DIAGNOSIS — Z170 Estrogen receptor positive status [ER+]: Secondary | ICD-10-CM | POA: Diagnosis not present

## 2021-03-22 DIAGNOSIS — C50412 Malignant neoplasm of upper-outer quadrant of left female breast: Secondary | ICD-10-CM | POA: Diagnosis present

## 2021-03-26 ENCOUNTER — Telehealth: Payer: Self-pay | Admitting: Hematology and Oncology

## 2021-03-26 NOTE — Telephone Encounter (Signed)
Scheduled per 6/9 los. Called and spoke with pt confirmed 6/9 appt

## 2021-04-24 ENCOUNTER — Encounter: Payer: Self-pay | Admitting: Internal Medicine

## 2021-09-19 ENCOUNTER — Encounter: Payer: Self-pay | Admitting: Internal Medicine

## 2021-10-03 ENCOUNTER — Other Ambulatory Visit: Payer: Self-pay

## 2021-10-03 NOTE — Patient Outreach (Signed)
Aging Gracefully Program  10/03/2021  Shelsy Seng 1946/06/14 [id number]  [facility] Evaluation Interviewer made contact with patient. Aging Gracefully survey completed.   Interviewer will send referral to [name], RN and OT for follow up.  [place] Management Assistant  [phone]

## 2021-10-03 NOTE — Patient Outreach (Signed)
Aging Gracefully Program  10/03/2021  Kourtnei Rauber 1946/06/26 [id number]  [facility] Evaluation Interviewer attempted to call patient on today regarding Aging Gracefully referral. No answer from patient after multiple rings. CMA left confidential voicemail for patient to return call.  Will attempt to call back within 1 week.   [place] Management Assistant [phone]

## 2021-10-16 ENCOUNTER — Encounter: Payer: Self-pay | Admitting: Hematology and Oncology

## 2021-10-16 LAB — HM MAMMOGRAPHY

## 2021-10-18 ENCOUNTER — Other Ambulatory Visit: Payer: Self-pay | Admitting: Occupational Therapy

## 2021-10-18 ENCOUNTER — Other Ambulatory Visit: Payer: Self-pay

## 2021-10-18 NOTE — Patient Outreach (Addendum)
Aging Gracefully Program  OT Initial Visit  10/18/2021  Connie Marshall Mar 23, 1946 [id number]  Visit:  1- Initial Visit  Start Time:  1000 End Time:  9798 Total Minutes:  33  CCAP: Typical Daily Routine: Typical Daily Routine:: Gets up and mainly stays around the house. She does go out to doctors appointments and to the grocery store (early on Friday and Saturday). What Types Of Care Problems Are You Having Throughout The Day?: Getting around due to right hip pain What Kind Of Help Do You Receive?: Son and dtr help with IADLs Do You Think You Need Other Types Of Help?: No What Do You Think Would Make Everyday Life Easier For You?: If my hip felt better What Is A Good Day Like?: When my hip doesn't hurt as bad What Is A Bad Day Like?: When my hip really hurts, but I am not a complainer Do You Have Time For Yourself?: yes Patient Reported Equipment: Patient Reported Equipment Currently Used: [employer] Functional Mobility-Walk A Block: Walk A Block: A Lot Of Difficulty Functional Mobility-Maintain Balance While Showering: Maintaining Balance While Showering: Moderate Difficulty Do You:: No Device/No Assistance Importance Of Learning New Strategies:: Very Much Other Comments:: She takes her time and moves slowly because she does not have any grab bars. Safety: Moderate/Extreme Risk Efficiency: Somewhat Intervention: Yes Other Comments:: Grab bars OR shower conversion Functional Mobility-Reaching For Items Above Shoulder Level: Reaching For Items Above Shoulder Level: Moderate Difficulty (with left arm--due to sx and radiation) Functional Mobility-Climb 1 Flight Of Stairs: Climb 1 Flight Of Stairs: Moderate Difficulty (needs a rail for sure) Other Comments::  (potential ramp with landing at back door) Functional Mobility-Move In And Out Of Chair: Move In and Out Of A Chair: Moderate Difficulty (if sits too long) Functional Mobility-Move In And Out Of Bath/Shower: Move In And Out Of  A Bath/Shower: Moderate Difficulty (no grab bars) Intervention: Yes Other Comments:: At least grab bars--maybe a tub>shower conversion with grab bars Functional Mobility-Get On And Off Toilet: Getting Up From The Floor: Unable To Do Activities of Daily Living-Bathing/Showering: ADL-Bathing/Showering: Moderate Difficulty Intervention: Yes Other Comments:: grab bars and shower seat Activities of Daily Living-Put On And Take Off Socks And Shoes: Put On And Take Off Socks And  Shoes: Moderate Difficulty Do You:: No Device/No Assistance Importance Of Learning New Strategies: Moderate Intervention: Yes Other Comments:: sock aid and long shoe horn  Readiness To Change Score:  Readiness to Change Score: 10  Home Environment Assessment: Outside Home Entry:: She entere and exits her home from her back door where there are steps to enter and one rail Bathroom:: She has a step in cut out tub and handheld shower Laundry:: She has a step down into her laundry room Basement:: partial with solme leaking per her report   Goals:  Goals Addressed             This Visit's Progress    Patient Stated       She would like increased ease of lower body dressing. (Sock aid and long handled shoe horn)     Patient Stated       Increased safety/ease with getting in and out of the house (possibly a ramp at the back door).     Patient Stated       She would like to feel more safe in her bathroom. (Options: 1) grab bars and shower seat; 2) shower conversion, grab bars, and shower seat). Grab to right and forward at toilet  if tub>shower conversion.     Patient Stated       She would like to feel more safe stepping up from her laundry room. (Grab bar)        Post Clinical Reasoning: Clinician View Of Client Situation:: Connie Marshall does well for herself. She stays home most of time only getting out for doctor appointments and to store occassionally. During the day she likes to get on her computer,  read, and do word searches.Her right hip pain (which is relatively new) has slowed her down. Her home church is in [place], but now when she is able to attend she goes to a church about a mile from her house.Her dtr and son help her around the house. Client View Of His/Her Situation:: Connie Marshall feels good about all she has done at her house and how active she was prior to [place] and her right hip really bothering her. She is really concerned about her hip and is doing all the right things to get it looked after. She keeps herself busy around the house. Next Visit Plan:: Lower body adaptive equipment  Golden Circle, OTR/L Aging Gracefully [phone]

## 2021-10-18 NOTE — Patient Instructions (Signed)
Goals Addressed             This Visit's Progress    Patient Stated       She would like increased ease of lower body dressing. (Sock aid and long handled shoe horn)     Patient Stated       Increased safety/ease with getting in and out of the house (possibly a ramp at the back door).     Patient Stated       She would like to feel more safe in her bathroom. (Options: 1) grab bars and shower seat; 2) shower conversion, grab bars, and shower seat). Grab to right and forward at toilet if tub>shower conversion.     Patient Stated       She would like to feel more safe stepping up from her laundry room. (Grab bar)

## 2021-10-19 ENCOUNTER — Other Ambulatory Visit: Payer: Self-pay

## 2021-10-19 NOTE — Patient Outreach (Signed)
Aging Gracefully Program  10/19/2021  Connie Marshall [birth year] [id number]   Placed call to patient to home visit for Aging Gracefully. Offered patient appointment for 10/31/2021 and she accepted. Confirmed address.  [name] RN, BSN, Careers information officer for [employer] Mobile: [phone]

## 2021-10-31 ENCOUNTER — Other Ambulatory Visit: Payer: Self-pay

## 2021-10-31 NOTE — Patient Instructions (Signed)
Goals Addressed               This Visit's Progress     Patient Stated (pt-stated)        RN Aging Gracefully:  Goal: Patient will verbalize decrease in pain in the next 4 months.  10/31/2021 Assessment:  Patient reports significant pain in the right hip.  She has tried injections which has not made any difference.  Reports she uses heat, massage, tylenol arthritis and aspercreme.  Pain 7/10 today. Ambulates with cane. Denies recent falls.  Has an appointment with Dr. Alvan Dame on 12/05/2021 Interventions: reviewed pain control. Reviewed exercise plan, reviewed additional OTC medications. Plan: next home visit planned for 12/03/2021  at 945 am  [place], BSN, [employer] RN Case Freight forwarder for [place] Mobile: [phone]

## 2021-10-31 NOTE — Patient Outreach (Signed)
Aging Gracefully Program  RN Visit  10/31/2021  Connie Marshall April 05, 1946 [id number]  Visit:   RN home visit #1  Start Time:   1030 End Time:   1130 Total Minutes:   60  Readiness To Change Score:     Universal RN Interventions: Calendar Distribution: Yes Exercise Review: No Medications: Yes Medication Changes: No Mood: Yes Pain: Yes PCP Advocacy/Support: No Fall Prevention: Yes Incontinence: Yes Clinician View Of Client Situation: Home neat and clean.  Steep hill for front entrance into the home.  Client walks with a cane. Slow ambulation due to pain. Noted during ambulation that client has problems picking up her feet due to pain. Client View Of His/Her Situation: Patient reports that her biggest issue with her health is the right hip pain, Has previously had injections without improvment.  Sees Dr. Alvan Dame on 12/05/2021.Marland Kitchen  Denies falls.  Use a cane.   Lives alone  Healthcare Provider Communication: Did Higher education careers adviser With [employer] Provider?: No According to Client, Did PCP Report Communication With An Aging Gracefully RN?: No  Clinician View of Client Situation: Public affairs consultant Of Client Situation: Home neat and clean.  Steep hill for front entrance into the home.  Client walks with a cane. Slow ambulation due to pain. Noted during ambulation that client has problems picking up her feet due to pain. Client's View of His/Her Situation: Client View Of His/Her Situation: Patient reports that her biggest issue with her health is the right hip pain, Has previously had injections without improvment.  Sees Dr. Alvan Dame on 12/05/2021.Marland Kitchen  Denies falls.  Use a cane.   Lives alone  Medication Assessment: Do You Have Any Problems Paying For Medications?: No Where Does Client Store Medications?: Other: Chief Executive Officer) Can Client Read Pill Bottles?: No Does Client Use A Pillbox?: Yes Does Anyone Assist Client In Filling Pillbox?: No Does Anyone Assist Client In Taking Medications?:  No Do You Take Vitamin D?: Yes Does Client Have Any Questions Or Concerns About Medictions?: No Is Client Complaining Of Any Symptoms That Could Be Side Effects To Medications?: No Any Possible Changes In Medication Regimen?: No  Outpatient Encounter Medications as of 10/31/2021  Medication Sig   anastrozole (ARIMIDEX) 1 MG tablet TAKE 1 TABLET(1 MG) BY MOUTH DAILY   B Complex-C (SUPER B COMPLEX PO) Take 1 capsule by mouth daily.   Cholecalciferol (VITAMIN D3 SUPER STRENGTH) 50 MCG (2000 UT) TABS Take 1 tablet by mouth daily.   fosinopril (MONOPRIL) 20 MG tablet Take 1 tablet (20 mg total) by mouth daily.   furosemide (LASIX) 40 MG tablet 1 tab by mouth in the AM, and 1 in the PM as needed for leg swelling   Misc Natural Products (TURMERIC CURCUMIN) CAPS Take 1 capsule by mouth daily. Take for immune system   simvastatin (ZOCOR) 20 MG tablet Take 1 tablet (20 mg total) by mouth every evening.   gabapentin (NEURONTIN) 300 MG capsule Take 1 capsule (300 mg total) by mouth at bedtime as needed. (Patient not taking: Reported on 10/31/2021)   No facility-administered encounter medications on file as of 10/31/2021.     OT Update: pending contracts  Session Summary: Patient doing very well living at home alone. Has good family support with son and daughter.  Main health concern is her hip pain. Pending ortho appointment.    Goals Addressed               This Visit's Progress     Patient Stated (pt-stated)  RN Aging Gracefully:  Goal: Patient will verbalize decrease in pain in the next 4 months.  10/31/2021 Assessment:  Patient reports significant pain in the right hip.  She has tried injections which has not made any difference.  Reports she uses heat, massage, tylenol arthritis and aspercreme.  Pain 7/10 today. Ambulates with cane. Denies recent falls.  Has an appointment with Dr. Alvan Dame on 12/05/2021 Interventions: reviewed pain control. Reviewed exercise plan, reviewed additional  OTC medications. Plan: next home visit planned for 12/03/2021  at 945 am  [place], BSN, [employer] RN Case Freight forwarder for [place] Mobile: [phone]

## 2021-11-20 ENCOUNTER — Encounter: Payer: Self-pay | Admitting: Hematology and Oncology

## 2021-12-03 ENCOUNTER — Other Ambulatory Visit: Payer: Self-pay

## 2021-12-03 NOTE — Patient Instructions (Signed)
Visit Information   Goals Addressed               This Visit's Progress     Patient Stated (pt-stated)        RN Aging Gracefully:  Goal: Patient will verbalize decrease in pain in the next 4 months.  10/31/2021 Assessment:  Patient reports significant pain in the right hip.  She has tried injections which has not made any difference.  Reports she uses heat, massage, tylenol arthritis and aspercreme.  Pain 7/10 today. Ambulates with cane. Denies recent falls.  Has an appointment with Dr. Alvan Dame on 12/05/2021 Interventions: reviewed pain control. Reviewed exercise plan, reviewed additional OTC medications. Plan: next home visit planned for 12/03/2021  at 945 am  [place], BSN, [place] RN Case Freight forwarder for [place] Mobile: [phone]   12/03/2020 Assessment:  Continues to have significant pain in her right hip affecting her quality of life.  Walking with cane.  No recent falls. Continues to take her medications as prescribed.   Interventions: provided written home exercise plan. Demonstrated how to do exercises.  Encouraged patient to do the exercises that she can.  Encouraged patient to call me after her appointment with Dr. Alvan Dame and we will establish a plan moving forward. I will contact OT and [employer] solutions after plan is established from Dr. Alvan Dame.   PLAN: awaiting call back from patient.  [name] RN, BSN, Careers information officer for [employer] Mobile: [phone]

## 2021-12-03 NOTE — Patient Outreach (Signed)
Aging Gracefully Program  RN Visit  12/03/2021  Connie Marshall [birth year] [id number]  Visit:   RN home visit #2 Start Time:   0945 End Time:   1020 Total Minutes:   35  Readiness To Change Score:     Universal RN Interventions: Calendar Distribution: Yes Exercise Review: Yes Medications: Yes Medication Changes: Yes Mood: Yes Pain: Yes PCP Advocacy/Support: No Fall Prevention: Yes Incontinence: Yes Clinician View Of Client Situation: Home neat and clean. Patient answered door and was using a cane.  Awake and alert.   Grimaces noted when sitting down in chair. Client View Of His/Her Situation: Reports no changes in medications. No new problems or concerns.  Reports she is sleeping with a pillow to support her hip. Family supportive. [employer] assessment is complete. Pending contracts.  Healthcare Provider Communication: Did Higher education careers adviser With [employer] Provider?: No According to Client, Did PCP Report Communication With An Aging Gracefully RN?: No  Clinician View of Client Situation: Clinician View Of Client Situation: Home neat and clean. Patient answered door and was using a cane.  Awake and alert.   Grimaces noted when sitting down in chair. Client's View of His/Her Situation: Client View Of His/Her Situation: Reports no changes in medications. No new problems or concerns.  Reports she is sleeping with a pillow to support her hip. Family supportive. Community Housing [employer] assessment is complete. Pending contracts.  Medication Assessment: no changes in medications    OT Update: pending contracts signatures  Session Summary: Patient continues to have significant pain in the right hip.  Office visit with Dr. Alvan Dame on 12/05/2021.    Goals Addressed               This Visit's Progress     Patient Stated (pt-stated)        RN Aging Gracefully:  Goal: Patient will verbalize decrease in pain in the next 4 months.  10/31/2021 Assessment:   Patient reports significant pain in the right hip.  She has tried injections which has not made any difference.  Reports she uses heat, massage, tylenol arthritis and aspercreme.  Pain 7/10 today. Ambulates with cane. Denies recent falls.  Has an appointment with Dr. Alvan Dame on 12/05/2021 Interventions: reviewed pain control. Reviewed exercise plan, reviewed additional OTC medications. Plan: next home visit planned for 12/03/2021  at 945 am  [place], BSN, [place] RN Case Freight forwarder for [place] Mobile: [phone]   12/03/2020 Assessment:  Continues to have significant pain in her right hip affecting her quality of life.  Walking with cane.  No recent falls. Continues to take her medications as prescribed.   Interventions: provided written home exercise plan. Demonstrated how to do exercises.  Encouraged patient to do the exercises that she can.  Encouraged patient to call me after her appointment with Dr. Alvan Dame and we will establish a plan moving forward. I will contact OT and [employer] solutions after plan is established from Dr. Alvan Dame.   PLAN: awaiting call back from patient.  [name] RN, BSN, Careers information officer for [employer] Mobile: [phone]              [name] RN, BSN, Careers information officer for [employer] Mobile: [phone]

## 2021-12-06 ENCOUNTER — Telehealth: Payer: Self-pay

## 2021-12-06 NOTE — Patient Outreach (Signed)
Aging Gracefully Program  12/06/2021  Connie Marshall 1946-03-09 [id number]   Care Coordination: Patient called back and states she will have a hip replacement in April.   I reviewed needs for home modifications prior to hip replacement with AG team on conference call.  I was informed that Darylene Price would call patient and review contracts and obtain signatures.   Placed call back to patient and provided her with an update.  [name] RN, BSN, Careers information officer for [employer] Mobile: [phone]

## 2021-12-17 ENCOUNTER — Encounter: Payer: Self-pay | Admitting: Internal Medicine

## 2021-12-19 ENCOUNTER — Encounter: Payer: Self-pay | Admitting: Hematology and Oncology

## 2021-12-19 ENCOUNTER — Other Ambulatory Visit: Payer: Self-pay | Admitting: Hematology and Oncology

## 2021-12-19 ENCOUNTER — Other Ambulatory Visit: Payer: Self-pay | Admitting: Internal Medicine

## 2021-12-20 ENCOUNTER — Other Ambulatory Visit: Payer: Self-pay

## 2021-12-20 ENCOUNTER — Encounter: Payer: Self-pay | Admitting: Internal Medicine

## 2021-12-20 ENCOUNTER — Other Ambulatory Visit: Payer: Self-pay | Admitting: Occupational Therapy

## 2021-12-20 NOTE — Patient Instructions (Signed)
She would like increased ease of lower body dressing. (Sock aid,long handled shoe horn, reacher) ? ?ACTION PLANNING - DRESSING/BATHING ?Target Problem Area: ?Difficulty with  lower body dressing   ?Why Problem May Occur: ?Decreased flexibility, increased pain, arthritis  ?Target Goal: ?Independence with putting lower body clothing on and taking it off  ?STRATEGIES ?Saving Your Energy: ?DO: DON'T:  ?Sit down to dress lower body Stand to dress lower body  ?Use appropriate adaptive equipment:  sock aid, reacher, and long shoehorn   ?Keep all items you'll need within easy reach   ? ?Simplifying the way you set up tasks or daily routines: ?DO: DON'T:  ?Wear sturdy shoes that grip the floor and have a back Wear scuffs or flip flops  ?Gather all items before getting started   ?Wear clothing that is easily manipulated Wear clothes you can get tripped up on.  ? ?Practice ?It is important to practice the strategies so we can determine if they will be effective in helping to reach your goal. ?Follow these specific recommendations: ?1.Use the sock aid  to get socks on, reacher to take off socks, reacher to put on underwear and pants putting painful leg in first, long handled shoe horn to put on shoes; use these items consistently to get used to using them ? ?If a strategy does not work the first time, try it again and again (and maybe again). ?We may make some changes over the next few sessions, based on how they work. ? ?[place], OTR/L        12/20/2021 ?

## 2021-12-20 NOTE — Patient Outreach (Signed)
Aging Gracefully Program ? ?OT Follow-Up Visit ? ?12/20/2021 ? ?Connie Marshall ?1946-05-03 ?[id number] ? ?Visit:  2- Second Visit ? ?Start Time:  1200 ?End Time:  3790 ?Total Minutes:  35 ? ? ?Readiness to Change Score :  Readiness to Change Score: 10 ? ?Durable Medical Equipment: ?Adaptive Equipment: Long Industrial/product designer, Long Handled Sponge, Secondary school teacher, Sock Aid ?Adaptive Equipment Distribution Date: 12/20/21 ? ? ?Goals:  ? Goals Addressed   ? ?  ?  ?  ?  ? This Visit's Progress  ?  COMPLETED: Patient Stated     ?  She would like increased ease of lower body dressing. (Sock aid,long handled shoe horn, reacher) ? ?ACTION PLANNING - DRESSING/BATHING ?Target Problem Area: ?Difficulty with  lower body dressing   ?Why Problem May Occur: ?Decreased flexibility, increased pain, arthritis  ?Target Goal: ?Independence with putting lower body clothing on and taking it off  ?STRATEGIES ?Saving Your Energy: ?DO: DON'T:  ?Sit down to dress lower body Stand to dress lower body  ?Use appropriate adaptive equipment:  sock aid, reacher, and long shoehorn   ?Keep all items you'll need within easy reach   ?Simplifying the way you set up tasks or daily routines: ?DO: DON'T:  ?Wear sturdy shoes that grip the floor and have a back Wear scuffs or flip flops  ?Gather all items before getting started   ?Wear clothing that is easily manipulated Wear clothes you can get tripped up on.  ? ?Practice ?It is important to practice the strategies so we can determine if they will be effective in helping to reach your goal. ?Follow these specific recommendations: ?1.Use the sock aid  to get socks on, reacher to take off socks, reacher to put on underwear and pants putting painful leg in first, long handled shoe horn to put on shoes; use these items consistently to get used to using them ? ?If a strategy does not work the first time, try it again and again (and maybe again). ?We may make some changes over the next few sessions, based on how they  work. ? ?[place], OTR/L        12/20/2021 ?  ? ?  ? ? ?Post Clinical Reasoning: ?Client Action (Goal) One Interventions: Client provided with long handled metal shoe horn, wide sock aid, and reacher. She practiced with reacher to take off a sock and adjust sock after she pulled it on with sock aid. We talked through use of reacher for putting on underwear and pants and use of the long handled shoe horn ?Did Client Try?: Yes ?Targeted Problem Area Status: A Lot Better ?Clinician View Of Client Situation:: Connie Marshall continues to have issue her right hip and will be having hip replacement surgery sometime in the near future. The adaptive equipment she was issued today will help now and after the hip replacement. In addition to the lower body dressing adaptive equipment she was also given a long handled shoe horn, ?Client View Of His/Her Situation:: Connie Marshall is a bit anxious about her up coming hip surgery and rightfully so. She lives by herself and plans on going to rehab after her hip surgery. ?Next Visit Plan:: I will order shower seat sent to her and also get in touch with her primary care doctor to order her a 3n1. ? ? ?Golden Circle, OTR/L ?Aging Gracefully ?[phone] ? ? ?

## 2021-12-21 NOTE — Telephone Encounter (Signed)
Called patient and left message in regards to her medication request.  ?

## 2021-12-24 ENCOUNTER — Telehealth: Payer: Self-pay | Admitting: Internal Medicine

## 2021-12-24 NOTE — Telephone Encounter (Signed)
Talked to patient in reference to her medication  ?

## 2021-12-24 NOTE — Telephone Encounter (Signed)
Pt returning cmas call regarding medication ? ?Please call pt ?

## 2021-12-25 ENCOUNTER — Telehealth: Payer: Self-pay | Admitting: Internal Medicine

## 2021-12-25 DIAGNOSIS — F0390 Unspecified dementia without behavioral disturbance: Secondary | ICD-10-CM

## 2021-12-25 DIAGNOSIS — G8929 Other chronic pain: Secondary | ICD-10-CM

## 2021-12-25 DIAGNOSIS — R269 Unspecified abnormalities of gait and mobility: Secondary | ICD-10-CM

## 2021-12-25 NOTE — Telephone Encounter (Signed)
Cathy from [employer] calling in ? ?Requesting rx order for bedside commode for patient be sent to [place] ? ?Patient name ?DOB ?Demographic form ?Face to face encounter form (all must be attached to fax) ? ?Fax [phone] ?ATTNAndree Coss ?

## 2021-12-26 NOTE — Telephone Encounter (Signed)
Rx done hardcopy ? ?Rest per staff plesae ?

## 2021-12-27 NOTE — Telephone Encounter (Signed)
Order has been faxed

## 2022-01-02 ENCOUNTER — Encounter: Payer: Self-pay | Admitting: Internal Medicine

## 2022-01-04 NOTE — Telephone Encounter (Signed)
Yes, this woud be fine ?

## 2022-01-11 ENCOUNTER — Other Ambulatory Visit: Payer: Medicare Other

## 2022-01-16 ENCOUNTER — Ambulatory Visit (INDEPENDENT_AMBULATORY_CARE_PROVIDER_SITE_OTHER): Payer: Medicare Other | Admitting: Internal Medicine

## 2022-01-16 ENCOUNTER — Encounter: Payer: Self-pay | Admitting: Internal Medicine

## 2022-01-16 VITALS — BP 112/58 | HR 60 | Temp 97.6°F | Ht 69.0 in | Wt 206.0 lb

## 2022-01-16 DIAGNOSIS — Z0001 Encounter for general adult medical examination with abnormal findings: Secondary | ICD-10-CM | POA: Diagnosis not present

## 2022-01-16 DIAGNOSIS — E1165 Type 2 diabetes mellitus with hyperglycemia: Secondary | ICD-10-CM

## 2022-01-16 DIAGNOSIS — E7800 Pure hypercholesterolemia, unspecified: Secondary | ICD-10-CM

## 2022-01-16 DIAGNOSIS — E559 Vitamin D deficiency, unspecified: Secondary | ICD-10-CM

## 2022-01-16 DIAGNOSIS — Z01818 Encounter for other preprocedural examination: Secondary | ICD-10-CM | POA: Diagnosis not present

## 2022-01-16 DIAGNOSIS — E538 Deficiency of other specified B group vitamins: Secondary | ICD-10-CM

## 2022-01-16 DIAGNOSIS — I10 Essential (primary) hypertension: Secondary | ICD-10-CM

## 2022-01-16 LAB — URINALYSIS, ROUTINE W REFLEX MICROSCOPIC
Bilirubin Urine: NEGATIVE
Hgb urine dipstick: NEGATIVE
Ketones, ur: NEGATIVE
Leukocytes,Ua: NEGATIVE
Nitrite: NEGATIVE
RBC / HPF: NONE SEEN (ref 0–?)
Specific Gravity, Urine: 1.015 (ref 1.000–1.030)
Total Protein, Urine: NEGATIVE
Urine Glucose: NEGATIVE
Urobilinogen, UA: 0.2 (ref 0.0–1.0)
WBC, UA: NONE SEEN (ref 0–?)
pH: 5.5 (ref 5.0–8.0)

## 2022-01-16 LAB — LIPID PANEL
Cholesterol: 113 mg/dL (ref 0–200)
HDL: 43.2 mg/dL (ref >39.00)
LDL Cholesterol: 55 mg/dL (ref 0–99)
NonHDL: 70.16
Total CHOL/HDL Ratio: 3
Triglycerides: 74 mg/dL (ref 0.0–149.0)
VLDL: 14.8 mg/dL (ref 0.0–40.0)

## 2022-01-16 LAB — CBC WITH DIFFERENTIAL/PLATELET
Basophils Absolute: 0 10*3/uL (ref 0.0–0.1)
Basophils Relative: 0.7 % (ref 0.0–3.0)
Eosinophils Absolute: 0.1 10*3/uL (ref 0.0–0.7)
Eosinophils Relative: 3.1 % (ref 0.0–5.0)
HCT: 35.2 % — ABNORMAL LOW (ref 36.0–46.0)
Hemoglobin: 12.2 g/dL (ref 12.0–15.0)
Lymphocytes Relative: 36.8 % (ref 12.0–46.0)
Lymphs Abs: 1.2 10*3/uL (ref 0.7–4.0)
MCHC: 34.5 g/dL (ref 30.0–36.0)
MCV: 87.7 fl (ref 78.0–100.0)
Monocytes Absolute: 0.3 10*3/uL (ref 0.1–1.0)
Monocytes Relative: 9.8 % (ref 3.0–12.0)
Neutro Abs: 1.6 10*3/uL (ref 1.4–7.7)
Neutrophils Relative %: 49.6 % (ref 43.0–77.0)
Platelets: 208 10*3/uL (ref 150.0–400.0)
RBC: 4.02 Mil/uL (ref 3.87–5.11)
RDW: 12.9 % (ref 11.5–15.5)
WBC: 3.2 10*3/uL — ABNORMAL LOW (ref 4.0–10.5)

## 2022-01-16 LAB — MICROALBUMIN / CREATININE URINE RATIO
Creatinine,U: 169.6 mg/dL
Microalb Creat Ratio: 0.7 mg/g (ref 0.0–30.0)
Microalb, Ur: 1.2 mg/dL (ref 0.0–1.9)

## 2022-01-16 LAB — HEPATIC FUNCTION PANEL
ALT: 13 U/L (ref 0–35)
AST: 21 U/L (ref 0–37)
Albumin: 4.4 g/dL (ref 3.5–5.2)
Alkaline Phosphatase: 57 U/L (ref 39–117)
Bilirubin, Direct: 0.1 mg/dL (ref 0.0–0.3)
Total Bilirubin: 0.5 mg/dL (ref 0.2–1.2)
Total Protein: 6.7 g/dL (ref 6.0–8.3)

## 2022-01-16 LAB — TSH: TSH: 1.33 u[IU]/mL (ref 0.35–5.50)

## 2022-01-16 LAB — VITAMIN D 25 HYDROXY (VIT D DEFICIENCY, FRACTURES): VITD: 26.4 ng/mL — ABNORMAL LOW (ref 30.00–100.00)

## 2022-01-16 LAB — VITAMIN B12: Vitamin B-12: 714 pg/mL (ref 211–911)

## 2022-01-16 LAB — BASIC METABOLIC PANEL
BUN: 13 mg/dL (ref 6–23)
CO2: 30 mEq/L (ref 19–32)
Calcium: 10.4 mg/dL (ref 8.4–10.5)
Chloride: 102 mEq/L (ref 96–112)
Creatinine, Ser: 0.86 mg/dL (ref 0.40–1.20)
GFR: 65.85 mL/min (ref >60.00)
Glucose, Bld: 99 mg/dL (ref 70–99)
Potassium: 3.5 mEq/L (ref 3.5–5.1)
Sodium: 139 mEq/L (ref 135–145)

## 2022-01-16 LAB — HEMOGLOBIN A1C: Hgb A1c MFr Bld: 6.1 % (ref 4.6–6.5)

## 2022-01-16 MED ORDER — TRAMADOL HCL 50 MG PO TABS: 50.0000 mg | ORAL_TABLET | Freq: Four times a day (QID) | ORAL | 0 refills | Status: DC | PRN

## 2022-01-16 MED ORDER — SIMVASTATIN 20 MG PO TABS: 20.0000 mg | ORAL_TABLET | Freq: Every evening | ORAL | 3 refills | Status: DC

## 2022-01-16 NOTE — Progress Notes (Signed)
Patient ID: Connie Marshall, female   DOB: 10/03/1946, 76 y.o.   MRN: [id number] ? ? ? ?     Chief Complaint:: wellness exam and preop right hip THR, dm, htn, hld, low vit d ? ?     HPI:  Connie Marshall is a 76 y.o. female here for wellness exam; plans to see eye doctor soon, declines shingrix, o/w up to date ?         ?              Also due for right hip THR soon, pain currently 8/10, limps to stand and walk, asks for pain control prior to surgury.  Pt denies chest pain, increased sob or doe, wheezing, orthopnea, PND, increased LE swelling, palpitations, dizziness or syncope.   Pt denies polydipsia, polyuria, or new focal neuro s/s.    Pt denies fever, wt loss, night sweats, loss of appetite, or other constitutional symptoms  Not taking Vit D.   ?  ?Wt Readings from Last 3 Encounters:  ?01/16/22 206 lb (93.4 kg)  ?03/22/21 230 lb 4.8 oz (104.5 kg)  ?02/19/21 237 lb (107.5 kg)  ? ?BP Readings from Last 3 Encounters:  ?01/16/22 (!) 112/58  ?03/22/21 139/69  ?02/19/21 (!) 148/76  ? ?Immunization History  ?Administered Date(s) Administered  ? Fluad Quad(high Dose 65+) 07/05/2019, 08/12/2020  ? Influenza Split 08/05/2011, 08/20/2012  ? Influenza Whole 07/18/2009  ? Influenza, High Dose Seasonal PF 09/06/2013, 09/13/2015, 08/12/2016, 07/16/2017, 07/21/2018  ? Influenza,inj,Quad PF,6+ Mos 09/07/2014  ? Influenza-Unspecified 07/24/2021  ? PFIZER(Purple Top)SARS-COV-2 Vaccination 11/18/2019, 12/09/2019, 09/29/2020  ? Pension scheme manager 70yr & up 07/10/2021  ? Pneumococcal Conjugate-13 09/27/2013  ? Pneumococcal Polysaccharide-23 08/05/2011  ? Td 10/15/2006  ? Tdap 09/29/2017  ? Zoster, Live 08/02/2010  ? ?There are no preventive care reminders to display for this patient. ? ?  ? ?Past Medical History:  ?Diagnosis Date  ? Axillary mass, left   ? Cancer ([facility]   ? Complication of anesthesia   ? Diverticulosis   ? Hemorrhoids   ? Hyperlipidemia   ? Hypertension   ? IBS (irritable bowel syndrome)   ? Obesity   ?  PONV (postoperative nausea and vomiting)   ? Pre-diabetes   ? Psoriasis   ? Shingles 02/19/2021  ? ?Past Surgical History:  ?Procedure Laterality Date  ? BREAST LUMPECTOMY WITH RADIOACTIVE SEED AND SENTINEL LYMPH NODE BIOPSY Bilateral 10/05/2019  ? Procedure: BILATERAL BREAST LUMPECTOMY WITH BILATERAL RADIOACTIVE SEEDS AND LEFT SENTINEL LYMPH NODE BIOPSY;  Surgeon: BCoralie Keens MD;  Location: [facility]  Service: General;  Laterality: Bilateral;  ? BREAST SURGERY    ? CESAREAN SECTION    ? 2 times  ? CHOLECYSTECTOMY    ? laproscopic  ? CHOLECYSTECTOMY, LAPAROSCOPIC    ? kidney stone removed  10/14/2009  ? MASS EXCISION Left 07/12/2019  ? Procedure: EXCISION LEFT AXILLARY MASS;  Surgeon: BCoralie Keens MD;  Location[facility]  Service: General;  Laterality: Left;  ? RE-EXCISION OF BREAST LUMPECTOMY Left 10/05/2019  ? Procedure: RE-EXCISION OF LEFT BREAST CANCER;  Surgeon: BCoralie Keens MD;  Location[facility]  Service: General;  Laterality: Left;  ? THYROGLOSSAL DUCT CYST  10/14/1998  ? TONSILLECTOMY  058/83/2549 ? umbilical herniorrhapy    ? VAGINAL HYSTERECTOMY  10/14/2001  ? ? reports that she has never smoked. She has never used smokeless tobacco. She reports that she does not drink alcohol and does not  use drugs. ?family history includes Diabetes in her father; Hypertension in her father and mother; Parkinson's disease in her sister; Prostate cancer in her brother. ?Allergies  ?Allergen Reactions  ? Silicone   ?  Other reaction(s): Other (See Comments)  ? Tape   ?  ALLERGIC TO NON-ALLERGIC TAPE-causes a rash  ? ?Current Outpatient Medications on File Prior to Visit  ?Medication Sig Dispense Refill  ? anastrozole (ARIMIDEX) 1 MG tablet TAKE 1 TABLET(1 MG) BY MOUTH DAILY 90 tablet 3  ? B Complex-C (SUPER B COMPLEX PO) Take 1 capsule by mouth daily.    ? Cholecalciferol (VITAMIN D3 SUPER STRENGTH) 50 MCG (2000 UT) TABS Take 1 tablet by mouth daily.     ? fosinopril (MONOPRIL) 20 MG tablet TAKE 1 TABLET(20 MG) BY MOUTH DAILY 90 tablet 0  ? furosemide (LASIX) 40 MG tablet 1 tab by mouth in the AM, and 1 in the PM as needed for leg swelling 180 tablet 3  ? Misc Natural Products (TURMERIC CURCUMIN) CAPS Take 1 capsule by mouth daily. Take for immune system    ? ?No current facility-administered medications on file prior to visit.  ? ?     ROS:  All others reviewed and negative. ? ?Objective  ? ?     PE:  BP (!) 112/58 (BP Location: Right Arm, Patient Position: Sitting, Cuff Size: Large)   Pulse 60   Temp 97.6 ?F (36.4 ?C) (Oral)   Ht '5\' 9"'$  (1.753 m)   Wt 206 lb (93.4 kg)   SpO2 99%   BMI 30.42 kg/m?  ? ?              Constitutional: Pt appears in NAD ?              HENT: Head: NCAT.  ?              Right Ear: External ear normal.   ?              Left Ear: External ear normal.  ?              Eyes: . Pupils are equal, round, and reactive to light. Conjunctivae and EOM are normal ?              Nose: without d/c or deformity ?              Neck: Neck supple. Gross normal ROM ?              Cardiovascular: Normal rate and regular rhythm.   ?              Pulmonary/Chest: Effort normal and breath sounds without rales or wheezing.  ?              Abd:  Soft, NT, ND, + BS, no organomegaly ?              Neurological: Pt is alert. At baseline orientation, motor grossly intact ?              Skin: Skin is warm. No rashes, no other new lesions, LE edema - none ?              Psychiatric: Pt behavior is normal without agitation  ? ?Micro: none ? ?Cardiac tracings I have personally interpreted today:  none ? ?Pertinent Radiological findings (summarize): none  ? ?Lab Results  ?Component Value Date  ? WBC 3.2 (L) 01/16/2022  ? HGB 12.2  01/16/2022  ? HCT 35.2 (L) 01/16/2022  ? PLT 208.0 01/16/2022  ? GLUCOSE 99 01/16/2022  ? CHOL 113 01/16/2022  ? TRIG 74.0 01/16/2022  ? HDL 43.20 01/16/2022  ? LDLDIRECT 115.2 09/26/2008  ? [place] 55 01/16/2022  ? ALT 13 01/16/2022  ? AST  21 01/16/2022  ? NA 139 01/16/2022  ? K 3.5 01/16/2022  ? CL 102 01/16/2022  ? CREATININE 0.86 01/16/2022  ? BUN 13 01/16/2022  ? CO2 30 01/16/2022  ? TSH 1.33 01/16/2022  ? HGBA1C 6.1 01/16/2022  ? MICROALBUR 1.2 01/16/2022  ? ?Assessment/Plan:  ?Connie Marshall is a 76 y.o. Black or African American [2] female with  has a past medical history of Axillary mass, left, Cancer ([place]), Complication of anesthesia, Diverticulosis, Hemorrhoids, Hyperlipidemia, Hypertension, IBS (irritable bowel syndrome), Obesity, PONV (postoperative nausea and vomiting), Pre-diabetes, Psoriasis, and Shingles (02/19/2021). ? ?Vitamin D deficiency ?Last vitamin D ?Lab Results  ?Component Value Date  ? VD25OH 35.49 01/02/2021  ? ?Low, to start oral replacement ? ? ?Encounter for well adult exam with abnormal findings ?Age and sex appropriate education and counseling updated with regular exercise and diet ?Referrals for preventative services - plans to call herself for eye exam ?Immunizations addressed - declines shingrix, o/w up to date ?Smoking counseling  - none needed ?Evidence for depression or other mood disorder - none significant ?Most recent labs reviewed. ?I have personally reviewed and have noted: ?1) the patient's medical and social history ?2) The patient's current medications and supplements ?3) The patient's height, weight, and BMI have been recorded in the chart ? ? ?Diabetes ([place]) ?Lab Results  ?Component Value Date  ? HGBA1C 6.1 01/16/2022  ? ?Stable, pt to continue current medical treatment  - diet ? ? ?Essential hypertension ?BP Readings from Last 3 Encounters:  ?01/16/22 (!) 112/58  ?03/22/21 139/69  ?02/19/21 (!) 148/76  ? ?Stable, pt to continue medical treatment monopril ? ? ?Hyperlipidemia ?Lab Results  ?Component Value Date  ? [place] 55 01/16/2022  ? ?Stable, pt to continue current statin zocor 20 ? ? ?Preop exam for internal medicine ?O/w stable and cleared for surgury - ok for tramadol prn use prior ? ?B12  deficiency ?Lab Results  ?Component Value Date  ? [id number] 714 01/16/2022  ? ?Stable, cont oral replacement - b12 1000 mcg qd ? ?Followup: Return in about 6 months (around 07/18/2022). ? ?[name], MD 01/20/2022 11:

## 2022-01-16 NOTE — Patient Instructions (Signed)
Please take all new medication as prescribed - the tramadol to help with pain prior to the surgury ? ?Please continue all other medications as before, and refills have been done if requested. ? ?Please have the pharmacy call with any other refills you may need. ? ?Please continue your efforts at being more active, low cholesterol diet, and weight control. ? ?You are otherwise up to date with prevention measures today. ? ?Please keep your appointments with your specialists as you may have planned ? ?Please go to the LAB at the blood drawing area for the tests to be done ? ?You will be contacted by phone if any changes need to be made immediately.  Otherwise, you will receive a letter about your results with an explanation, but please check with MyChart first. ? ?Please remember to sign up for MyChart if you have not done so, as this will be important to you in the future with finding out test results, communicating by private email, and scheduling acute appointments online when needed. ? ?Please make an Appointment to return in 6 months, or sooner if needed ? ?

## 2022-01-16 NOTE — Assessment & Plan Note (Signed)
Last vitamin D ?Lab Results  ?Component Value Date  ? VD25OH 35.49 01/02/2021  ? ?Low, to start oral replacement ? ?

## 2022-01-20 ENCOUNTER — Encounter: Payer: Self-pay | Admitting: Internal Medicine

## 2022-01-20 NOTE — Assessment & Plan Note (Signed)
Lab Results  ?Component Value Date  ? HGBA1C 6.1 01/16/2022  ? ?Stable, pt to continue current medical treatment  - diet ? ?

## 2022-01-20 NOTE — Assessment & Plan Note (Signed)
O/w stable and cleared for surgury - ok for tramadol prn use prior ?

## 2022-01-20 NOTE — Assessment & Plan Note (Signed)
Age and sex appropriate education and counseling updated with regular exercise and diet ?Referrals for preventative services - plans to call herself for eye exam ?Immunizations addressed - declines shingrix, o/w up to date ?Smoking counseling  - none needed ?Evidence for depression or other mood disorder - none significant ?Most recent labs reviewed. ?I have personally reviewed and have noted: ?1) the patient's medical and social history ?2) The patient's current medications and supplements ?3) The patient's height, weight, and BMI have been recorded in the chart ? ?

## 2022-01-20 NOTE — Assessment & Plan Note (Signed)
BP Readings from Last 3 Encounters:  ?01/16/22 (!) 112/58  ?03/22/21 139/69  ?02/19/21 (!) 148/76  ? ?Stable, pt to continue medical treatment monopril ? ?

## 2022-01-20 NOTE — Assessment & Plan Note (Signed)
Lab Results  ?Component Value Date  ? [id number] 714 01/16/2022  ? ?Stable, cont oral replacement - b12 1000 mcg qd ? ?

## 2022-01-20 NOTE — Assessment & Plan Note (Signed)
Lab Results  ?Component Value Date  ? [place] 55 01/16/2022  ? ?Stable, pt to continue current statin zocor 20 ? ?

## 2022-02-06 ENCOUNTER — Encounter: Payer: Self-pay | Admitting: Internal Medicine

## 2022-02-28 ENCOUNTER — Telehealth: Payer: Self-pay | Admitting: Hematology and Oncology

## 2022-02-28 NOTE — Telephone Encounter (Signed)
Rescheduled appointment per provider PAL. Patient is aware of the changes made to her upcoming appointment. 

## 2022-03-14 ENCOUNTER — Other Ambulatory Visit: Payer: Self-pay | Admitting: Occupational Therapy

## 2022-03-15 NOTE — Patient Instructions (Addendum)
Increased safety/ease with getting in and out of the house (possibly a ramp at the back door).--ramp was decided against but [facility] was able to get someone to break up concrete at top of her driveway and pour a new level concrete pad.  She would like to feel more safe in her bathroom. (Options: 1) grab bars and shower seat; 2) shower conversion, grab bars, and shower seat). Grab to right and forward at toilet if tub>shower conversion. The final was a tub to walk in shower conversion with, grab bars, hand held shower and shower seat. Also a new comfort height toilet was put in and a grab next to toilet.  She would like to feel more safe in her bathroom. (Options: 1) grab bars and shower seat; 2) shower conversion, grab bars, and shower seat). Grab to right and forward at toilet if tub>shower conversion. The final was a tub to walk in shower conversion with, grab bars, hand held shower and shower seat. Also a new comfort height toilet was put in and a grab next to toilet.

## 2022-03-15 NOTE — Patient Outreach (Signed)
Aging Gracefully Program  OT Follow-Up Visit  03/14/2022  Connie Marshall 1946/01/13 [id number]  Visit:  3- Third Visit  Start Time:  1440 End Time:  6283 Total Minutes:  40   Readiness to Change Score :  Readiness to Change Score: 10  Durable Medical Equipment: Durable Medical Equipment: Shower Chair With Back Durable Medical Equipment Distribution Date:  (order and sent to her since last visit)   Goals:   Goals Addressed             This Visit's Progress    COMPLETED: Patient Stated       Increased safety/ease with getting in and out of the house (possibly a ramp at the back door).--ramp was decided against but [facility] was able to get someone to break up concrete at top of her driveway and pour a new level concrete pad.     COMPLETED: Patient Stated       She would like to feel more safe in her bathroom. (Options: 1) grab bars and shower seat; 2) shower conversion, grab bars, and shower seat). Grab to right and forward at toilet if tub>shower conversion. The final was a tub to walk in shower conversion with, grab bars, hand held shower and shower seat. Also a new comfort height toilet was put in and a grab next to toilet.     COMPLETED: Patient Stated       She would like to feel more safe stepping up from her laundry room. (Grab bar) Grab bar was added.        Post Clinical Reasoning: Clinician View Of Client Situation:: Ms. Pizano was provided with "Tips for Aging at Home" booklet. We also discussed her goals--that have all been met. She is doing for herself despite her hip pain and is going to see another orthopedic surgeon this coming Wednesday. Client View Of His/Her Situation:: She is happy with all of her home modifications and repairs and now feels ready to have her hip surgery. Next Visit Plan:: Follow up post hip surgery  [place], OTR/L [place] [phone] Office [phone]

## 2022-03-19 ENCOUNTER — Encounter: Payer: Self-pay | Admitting: Internal Medicine

## 2022-03-20 NOTE — Telephone Encounter (Signed)
See other mychart msg.. pharmacy has been out they have filled the rx...Connie Marshall

## 2022-03-21 ENCOUNTER — Encounter: Payer: Self-pay | Admitting: Internal Medicine

## 2022-03-21 MED ORDER — FUROSEMIDE 40 MG PO TABS: ORAL_TABLET | ORAL | 3 refills | Status: DC

## 2022-03-21 MED ORDER — FOSINOPRIL SODIUM 20 MG PO TABS: ORAL_TABLET | ORAL | 2 refills | Status: DC

## 2022-03-22 ENCOUNTER — Telehealth: Payer: Medicare Other | Admitting: Hematology and Oncology

## 2022-03-27 ENCOUNTER — Ambulatory Visit (INDEPENDENT_AMBULATORY_CARE_PROVIDER_SITE_OTHER): Payer: Medicare Other

## 2022-03-27 ENCOUNTER — Ambulatory Visit (INDEPENDENT_AMBULATORY_CARE_PROVIDER_SITE_OTHER): Payer: Medicare Other | Admitting: Orthopaedic Surgery

## 2022-03-27 VITALS — Ht 67.0 in | Wt 205.0 lb

## 2022-03-27 DIAGNOSIS — M1611 Unilateral primary osteoarthritis, right hip: Secondary | ICD-10-CM

## 2022-03-27 DIAGNOSIS — M25551 Pain in right hip: Secondary | ICD-10-CM

## 2022-03-27 NOTE — Progress Notes (Signed)
Office Visit Note   Patient: Connie Marshall           Date of Birth: [birth year]           MRN: [id number] Visit Date: 03/27/2022              Requested by: [name], MD [place],  [place] [address] PCP: [name], MD   Assessment & Plan: Visit Diagnoses:  1. Pain in right hip   2. Unilateral primary osteoarthritis, right hip     Plan: The patient has debilitating and surgical focus of the right hip and we are recommending hip replacement.  I went over her x-rays in detail and described what hip replacement surgery involves.  I gave her handout about hip replacement.  I discussed this with her daughter who is with her as well.  We talked about the risks and benefits of surgery and what to expect from an intraoperative and postoperative course.  Her family would not be up to help her during the daytime as she gets over surgery and she will likely need short-term skilled nursing placement following surgery.  All question concerns were answered and addressed.  We will work on getting her scheduled for a right total hip arthroplasty.  Follow-Up Instructions: Return for 2 weeks post-op.   Orders:  Orders Placed This Encounter  Procedures   XR HIP UNILAT W OR W/O PELVIS 1V RIGHT   No orders of the defined types were placed in this encounter.     Procedures: No procedures performed   Clinical Data: No additional findings.   Subjective: Chief Complaint  Patient presents with   Right Hip - Pain  The patient is a 76 year old female that is brand-new to [place].  I am seeing her for the first time.  He has been dealing with right hip pain chronically for many years now and she is a patient of my brother Dr. [name] who sent her over my way after noting painful weightbearing and difficulty with ambulating.  She does use a quad cane.  She lives alone but has supportive family near her.  She is not a diabetic.  She has been dealing with hip pain for many years now  but is gotten worse over the last 12 months.  She has had intra-articular injections and that has not helped.  At this point her right hip pain is debilitating and it is 10 out of 10.  It is detrimentally affecting her mobility, her quality of life and actives daily living.  Her BMI is 32.11.  She is a patient of Dr. [name] who she sees regularly.  HPI  Review of Systems There is currently listed no fever, chills, nausea, vomiting  Objective: Vital Signs: Ht '5\' 7"'$  (1.702 m)   Wt 205 lb (93 kg)   BMI 32.11 kg/m   Physical Exam She is alert and orient x3 and in no acute distress Ortho Exam Examination of her left hip shows it moves smoothly normally with no blocks to rotation no pain at all.  Her right hip is very painful with any attempts of range of motion and there is stiffness to the degree that I cannot fully internally or externally take her right hip.  She is also shorter on the right side when comparing the right and left sides. Specialty Comments:  No specialty comments available.  Imaging: XR HIP UNILAT W OR W/O PELVIS 1V RIGHT  Result Date: 03/27/2022  An AP pelvis lateral the right hip shows severe end-stage arthritis of the right hip.  There is complete loss of the joint space with flattening of the femoral head.  There are cystic changes in the femoral head and acetabulum and slight femoral head collapse.  The left hip joint space appears normal.    PMFS History: Patient Active Problem List   Diagnosis Date Noted   Unilateral primary osteoarthritis, right hip 03/27/2022   Preop exam for internal medicine 01/16/2022   Insect bite, infected 02/19/2021   Shingles outbreak 02/19/2021   Dysuria 05/16/2020   Right knee pain 05/16/2020   Carcinoma of upper-outer quadrant of left breast in female, estrogen receptor positive ([place]) 08/25/2019   Bilateral breast cancer ([facility]) 08/17/2019   Skin nodule 05/11/2019   B12 deficiency 05/11/2019   Vitamin D deficiency 05/11/2019    Acute lymphadenitis 07/16/2017   Eustachian tube dysfunction, bilateral 07/16/2017   Prolapse of female bladder, acquired 03/25/2017   Sudden right hearing loss 03/25/2017   Left otitis media 03/25/2017   Rash 11/07/2015   Abnormal mammogram 09/13/2015   Blister of toe of left foot 04/13/2015   Breast discharge 04/13/2015   Encounter for well adult exam with abnormal findings 08/06/2011   Microscopic hematuria 04/27/2010   ANXIETY 03/25/2009   PARESTHESIA 03/23/2009   PERIPHERAL EDEMA 03/23/2009   HEMORRHOIDS, INTERNAL 02/13/2008   DIVERTICULOSIS, COLON 02/13/2008   IBS 02/13/2008   CHOLECYSTECTOMY, LAPAROSCOPIC, HX OF 02/13/2008   OBESITY NOS 07/07/2007   Diabetes ([place] [facility]) 07/06/2007   Hyperlipidemia 07/06/2007   Essential hypertension 07/06/2007   Past Medical History:  Diagnosis Date   Axillary mass, left    Cancer ([facility])    Complication of anesthesia    Diverticulosis    Hemorrhoids    Hyperlipidemia    Hypertension    IBS (irritable bowel syndrome)    Obesity    PONV (postoperative nausea and vomiting)    Pre-diabetes    Psoriasis    Shingles 02/19/2021    Family History  Problem Relation Age of Onset   Hypertension Mother    Diabetes Father    Hypertension Father    Parkinson's disease Sister    Prostate cancer Brother     Past Surgical History:  Procedure Laterality Date   BREAST LUMPECTOMY WITH RADIOACTIVE SEED AND SENTINEL LYMPH NODE BIOPSY Bilateral 10/05/2019   Procedure: BILATERAL BREAST LUMPECTOMY WITH BILATERAL RADIOACTIVE SEEDS AND LEFT SENTINEL LYMPH NODE BIOPSY;  Surgeon: Coralie Keens, MD;  Location: [facility];  Service: General;  Laterality: Bilateral;   BREAST SURGERY     CESAREAN SECTION     2 times   CHOLECYSTECTOMY     laproscopic   CHOLECYSTECTOMY, LAPAROSCOPIC     kidney stone removed  10/14/2009   MASS EXCISION Left 07/12/2019   Procedure: EXCISION LEFT AXILLARY MASS;  Surgeon: Coralie Keens, MD;  Location: [facility];  Service: General;  Laterality: Left;   RE-EXCISION OF BREAST LUMPECTOMY Left 10/05/2019   Procedure: RE-EXCISION OF LEFT BREAST CANCER;  Surgeon: Coralie Keens, MD;  Location: [facility];  Service: General;  Laterality: Left;   THYROGLOSSAL DUCT CYST  10/14/1998   TONSILLECTOMY  53/61/4431   umbilical herniorrhapy     VAGINAL HYSTERECTOMY  10/14/2001   Social History   Occupational History   Occupation: Retired  Tobacco Use   Smoking status: Never   Smokeless tobacco: Never  Vaping Use   Vaping Use: Never used  Substance and Sexual  Activity   Alcohol use: No   Drug use: No   Sexual activity: Not Currently

## 2022-04-03 ENCOUNTER — Telehealth: Payer: Self-pay

## 2022-04-03 NOTE — Patient Outreach (Signed)
Aging Gracefully Program  04/03/2022  Connie Marshall 12/04/1945 [id number]  Care Coordination:  Patient was pending surgery for 01/2022.  I placed call to patient today and she reports she has not had surgery yet.  I scheduled home visit for 04/08/2022 at [place], BSN, CEN RN Case Freight forwarder for [place]: [phone]

## 2022-04-08 ENCOUNTER — Other Ambulatory Visit: Payer: Self-pay

## 2022-04-09 ENCOUNTER — Other Ambulatory Visit: Payer: Self-pay

## 2022-04-11 ENCOUNTER — Encounter: Payer: Self-pay | Admitting: Internal Medicine

## 2022-04-19 ENCOUNTER — Inpatient Hospital Stay: Payer: Medicare Other | Attending: Hematology and Oncology | Admitting: Hematology and Oncology

## 2022-04-19 DIAGNOSIS — C50412 Malignant neoplasm of upper-outer quadrant of left female breast: Secondary | ICD-10-CM | POA: Diagnosis not present

## 2022-04-19 DIAGNOSIS — Z170 Estrogen receptor positive status [ER+]: Secondary | ICD-10-CM

## 2022-04-19 NOTE — Assessment & Plan Note (Signed)
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 Left breast reexcision: IDC grade 2, 1.7 cm, intermediate grade DCIS, margins clear, 0/6 lymph nodes negative, ER 95%, PR 95%, HER-2 negative, Ki-67 15%, T3N0 Stage Ib pathologic stage  Oncotype score: 0, distant recurrence at 9 years: 3% Adjuvant radiation: 11/23/2019-12/21/2019  Treatment plan: Adjuvant antiestrogen therapy with anastrozole 1 mg daily x5 yearsstarted march 2021 AnastrozoleToxicities: 1.Diffuse muscle aches and stiffness: Takes Advil Tylenol and turmeric 2.Hot flashes: Resolved  Breast Cancer Surveillance: Mammograms 10/16/21: Benign.  Bone Density: 11/09/20:T score -0.2  Return to clinic in 1 year for follow-up

## 2022-04-19 NOTE — Progress Notes (Signed)
HEMATOLOGY-ONCOLOGY [place] VISIT PROGRESS NOTE  I connected with Connie Marshall on 04/19/2022 at  9:30 AM EDT by Mychart video conference and verified that I am speaking with the correct person using two identifiers.  I discussed the limitations, risks, security and privacy concerns of performing an evaluation and management service by Webex and the availability of in person appointments.  I also discussed with the patient that there may be a patient responsible charge related to this service. The patient expressed understanding and agreed to proceed.  Patient's Location: Home Physician Location: Clinic  CHIEF COMPLIANT: Follow-up of left breast cancer on anastrozole  INTERVAL HISTORY: Connie Marshall is a 76 y.o. female with above-mentioned history of 76 y.o. with above-mentioned history of left breast cancer treated with bilateral lumpectomies, radiation, and is currently on antiestrogen therapy with anastrozole. She presents to the clinic today for a Mychart follow-up  Hip replacement Aug 27th scheduled Occ hot flashes  Oncology History  Bilateral breast cancer ([place])  07/12/2019 Initial Diagnosis   Left axillary soft tissue excision: Invasive ductal carcinoma lymphoid tissue not present   08/02/2019 Breast MRI   Right breast enhancing linear mass 6 x 6 x 13 mm, left breast enhancing irregular mass 5 x 15 x 5 mm, no abnormal appearing lymph nodes   08/19/2019 Cancer Staging   Staging form: Breast, AJCC 8th Edition - Clinical stage from 08/19/2019: Stage IIA (cT3, cN0, cM0, G2, ER+, PR+, HER2-) - Signed by Nicholas Lose, MD on 08/19/2019   10/05/2019 Surgery   Right lumpectomy: 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 Left breast reexcision: IDC grade 2, 1.7 cm, intermediate grade DCIS, margins clear, 0/6 lymph nodes negative, ER 95%, PR 95%, HER-2 negative, Ki-67 15%, T3N0   10/14/2019 Cancer Staging    Staging form: Breast, AJCC 8th Edition - Pathologic stage from 10/14/2019: Stage IB (pT3, pN0(sn), cM0, G2, ER+, PR+, HER2-) - Signed by Nicholas Lose, MD on 10/14/2019   11/23/2019 - 12/22/2019 Radiation Therapy   Adjuvant radiation   12/21/2019 -  Anti-estrogen oral therapy   Anastrozole, 42m daily, planned duration 5 years   Carcinoma of upper-outer quadrant of left breast in female, estrogen receptor positive ([place]  08/25/2019 Initial Diagnosis   Carcinoma of upper-outer quadrant of left breast in female, estrogen receptor positive ([place]   08/25/2019 Cancer Staging   Staging form: Breast, AJCC 8th Edition - Clinical: Stage IIA (cT3, cN0, cM0, G2, ER+, PR+, HER2-) - Signed by SEppie Gibson MD on 08/25/2019   09/02/2019 Oncotype testing   Oncotype: score of 0 with a 3% chance of distant recurrence in 9 years with antiestrogen therapy alone.    10/05/2019 Surgery   Bilateral lumpectomies ([place]:  Right breast: intraductal papilloma and no evidence of malignancy  Left breast: IDC with DCIS, grade 2, 1.7cm, clear margins, 6 left axillary lymph nodes negative      REVIEW OF SYSTEMS:   Constitutional: Denies fevers, chills or abnormal weight loss   All other systems were reviewed with the patient and are negative.  Observations/Objective:  There were no vitals filed for this visit. There is no height or weight on file to calculate BMI.  I have reviewed the data as listed    Latest Ref Rng & Units 01/16/2022   11:37 AM 01/02/2021    9:01 AM 09/30/2019    9:18 AM  CMP  Glucose 70 - 99 mg/dL 99  120  120   BUN 6 - 23  mg/dL _0 Creatinine 0.40 - 1.20 mg/dL 0.86  0.82  0.82   Sodium 135 - 145 mEq/L 139  137  138   Potassium 3.5 - 5.1 mEq/L 3.5  4.0  3.6   Chloride 96 - 112 mEq/L 102  101  105   CO2 19 - 32 mEq/L _1 Calcium 8.4 - 10.5 mg/dL 10.4  10.5  9.9   Total Protein 6.0 - 8.3 g/dL 6.7  7.1  7.2   Total Bilirubin 0.2 - 1.2 mg/dL 0.5  0.6  0.5   Alkaline  Phos 39 - 117 U/L 57  61  61   AST 0 - 37 U/L _2 ALT 0 - 35 U/L _3 Lab Results  Component Value Date   WBC 3.2 (L) 01/16/2022   HGB 12.2 01/16/2022   HCT 35.2 (L) 01/16/2022   MCV 87.7 01/16/2022   PLT 208.0 01/16/2022   NEUTROABS 1.6 01/16/2022      Assessment Plan:  Carcinoma of upper-outer quadrant of left breast in female, estrogen receptor positive ([place]) 07/12/2019:Left axillary soft tissue excision: Invasive ductal carcinoma lymphoid tissue not present ER/PR positive HER-2 equivocal by IHC, FISH pending Because the primary size is at least more than 5 cm, T3N0   10/05/2019:Right lumpectomy: 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 Left breast reexcision: IDC grade 2, 1.7 cm, intermediate grade DCIS, margins clear, 0/6 lymph nodes negative, ER 95%, PR 95%, HER-2 negative, Ki-67 15%, T3N0 Stage Ib pathologic stage   Oncotype score: 0, distant recurrence at 9 years: 3% Adjuvant radiation: 11/23/2019-12/21/2019   Treatment plan: Adjuvant antiestrogen therapy with anastrozole 1 mg daily x5 years started march 2021 Anastrozole Toxicities: 1.  Diffuse muscle aches and stiffness: Takes Advil Tylenol and turmeric 2.  Hot flashes: Resolved   Breast Cancer Surveillance: Mammograms 10/16/21: Benign.   Bone Density: 11/09/20:T score -0.2   Return to clinic in 1 year for follow-up    I discussed the assessment and treatment plan with the patient. The patient was provided an opportunity to ask questions and all were answered. The patient agreed with the plan and demonstrated an understanding of the instructions. The patient was advised to call back or seek an in-person evaluation if the symptoms worsen or if the condition fails to improve as anticipated.   I provided 12 minutes of face-to-face Web Ex time during this encounter.    Rulon Eisenmenger, MD 04/19/2022  I Gardiner Coins am scribing for Dr. Lindi Adie  I have reviewed the above documentation for accuracy and completeness, and I agree with the above.

## 2022-04-22 ENCOUNTER — Other Ambulatory Visit: Payer: Self-pay

## 2022-04-22 NOTE — Patient Instructions (Signed)
Visit Information  Thank you for taking time to visit with me today. Please don't hesitate to contact me if I can be of assistance to you before our next scheduled telephone appointment.  Following are the goals we discussed today:   Goals Addressed               This Visit's Progress     COMPLETED: Patient Stated (pt-stated)        RN Aging Gracefully:  Goal: Patient will verbalize decrease in pain in the next 4 months.  10/31/2021 Assessment:  Patient reports significant pain in the right hip.  She has tried injections which has not made any difference.  Reports she uses heat, massage, tylenol arthritis and aspercreme.  Pain 7/10 today. Ambulates with cane. Denies recent falls.  Has an appointment with Dr. Alvan Dame on 12/05/2021 Interventions: reviewed pain control. Reviewed exercise plan, reviewed additional OTC medications. Plan: next home visit planned for 12/03/2021  at 945 am  [place], BSN, [place] RN Case Freight forwarder for [place] Mobile: [phone]   12/03/2020 Assessment:  Continues to have significant pain in her right hip affecting her quality of life.  Walking with cane.  No recent falls. Continues to take her medications as prescribed.   Interventions: provided written home exercise plan. Demonstrated how to do exercises.  Encouraged patient to do the exercises that she can.  Encouraged patient to call me after her appointment with Dr. Alvan Dame and we will establish a plan moving forward. I will contact OT and [employer] solutions after plan is established from Dr. Alvan Dame.   PLAN: awaiting call back from patient.  [name] RN, BSN, CEN RN Case Freight forwarder for [employer] Mobile: [phone]    04/08/2022 Assessment: patient was expected to have surgery in 01/2022.  This fell through.   Patient has now seen Dr. Rush Farmer and is scheduled for surgery on 06/07/2022. Patient is on the on call list for  cancellation for an early surgery.  Patient reports severe pain in her right hip. Worsening pain every day.  10/10. Denies any recent falls.  Patient was clear for surgery by PCP.  Patient reports that she is doing her home exercises as she can. Intervention:  reviewed pain control, pain assessment and pending surgery.  Reviewed [employer] home modifications.  Encouraged patient to continue to be active and do home exercises. Reviewed preparation for rehab. Plan: next follow up planned for 04/22/2022.  [name] RN, BSN, CEN RN Case Freight forwarder for [place] Mobile: [phone]   04/22/2022 Assessment:  Continues to have pain in the right hip. Takes tylenol as needed which helps. Continues to doing bed exercises as she can tolerate.  Intervention: Placed call to surgeons office to inquire about her being on the waiting list.  Reviewed with patient to take the tylenol every 8 hours as needed.  PLAN:  AG has completed program. Reviewed with patient that she will receive calls at 1,3, 5 months for a survey.  [name] RN, BSN, CEN RN Case Freight forwarder for [place] Mobile: [phone]           If you are experiencing a Mental Health or [place] Line or need someone to talk to, please call the Suicide and Crisis Lifeline: 988 call the [place] National Suicide Prevention Lifeline: 775-846-2669 or TTY: 281-785-5019 TTY 864 842 1264) to talk to a trained counselor call 1-800-273-TALK (toll free, 24 hour  hotline) call 911   The patient verbalized understanding of instructions, educational materials, and care plan provided today and DECLINED offer to receive copy of patient instructions, educational materials, and care plan.   [name] RN, BSN, Careers information officer for [employer] Mobile: [phone]

## 2022-04-22 NOTE — Patient Outreach (Signed)
Aging Gracefully Program  RN Visit  04/22/2022  Connie Marshall Mar 15, 1946 [id number]  Visit:   RN home visit #4  Start Time:   0930 End Time:   5027 Total Minutes:   58  Readiness To Change Score:     Universal RN Interventions: Calendar Distribution: Yes Exercise Review: Yes Medications: Yes Medication Changes: Yes Mood: Yes Pain: Yes Fall Prevention: No Clinician View Of Client Situation: Home neat and clean. Patient is alert and oriented Client View Of His/Her Situation: Patient reports she continues to wait for surgery. Continues to have right hip pain. worse with movement.  All work is completed by [employer].  OT is done  Healthcare Provider Communication: call to surgeon office to see where patient is on the waiting list    Clinician View of Client Situation: Clinician View Of Client Situation: Home neat and clean. Patient is alert and oriented Client's View of His/Her Situation: Client View Of His/Her Situation: Patient reports she continues to wait for surgery. Continues to have right hip pain. worse with movement.  All work is completed by [employer].  OT is done  Medication Assessment:no changes in medications    OT Update: AG program completed  Session Summary: AG program finished. Pending surgery on the right hip.  Goals Addressed               This Visit's Progress     COMPLETED: Patient Stated (pt-stated)        RN Aging Gracefully:  Goal: Patient will verbalize decrease in pain in the next 4 months.  10/31/2021 Assessment:  Patient reports significant pain in the right hip.  She has tried injections which has not made any difference.  Reports she uses heat, massage, tylenol arthritis and aspercreme.  Pain 7/10 today. Ambulates with cane. Denies recent falls.  Has an appointment with Dr. Alvan Dame on 12/05/2021 Interventions: reviewed pain control. Reviewed exercise plan, reviewed additional OTC medications. Plan: next  home visit planned for 12/03/2021  at 945 am  [place], BSN, [place] RN Case Freight forwarder for [place] Mobile: [phone]   12/03/2020 Assessment:  Continues to have significant pain in her right hip affecting her quality of life.  Walking with cane.  No recent falls. Continues to take her medications as prescribed.   Interventions: provided written home exercise plan. Demonstrated how to do exercises.  Encouraged patient to do the exercises that she can.  Encouraged patient to call me after her appointment with Dr. Alvan Dame and we will establish a plan moving forward. I will contact OT and [employer] solutions after plan is established from Dr. Alvan Dame.   PLAN: awaiting call back from patient.  [name] RN, BSN, CEN RN Case Freight forwarder for [employer] Mobile: [phone]    04/08/2022 Assessment: patient was expected to have surgery in 01/2022.  This fell through.   Patient has now seen Dr. Rush Farmer and is scheduled for surgery on 06/07/2022. Patient is on the on call list for cancellation for an early surgery.  Patient reports severe pain in her right hip. Worsening pain every day.  10/10. Denies any recent falls.  Patient was clear for surgery by PCP.  Patient reports that she is doing her home exercises as she can. Intervention:  reviewed pain control, pain assessment and pending surgery.  Reviewed [employer] home modifications.  Encouraged patient to continue to be active and do home exercises. Reviewed preparation for rehab. Plan:  next follow up planned for 04/22/2022.  [name] RN, BSN, CEN RN Case Freight forwarder for [place] Mobile: [phone]   04/22/2022 Assessment:  Continues to have pain in the right hip. Takes tylenol as needed which helps. Continues to doing bed exercises as she can tolerate.  Intervention: Placed call to surgeons office to inquire about her being  on the waiting list.  Reviewed with patient to take the tylenol every 8 hours as needed.  PLAN:  AG has completed program. Reviewed with patient that she will receive calls at 1,3, 5 months for a survey.  [name] RN, BSN, Careers information officer for [employer] Mobile: [phone]         [name] RN, BSN, Careers information officer for [employer] Mobile: [phone]

## 2022-04-30 ENCOUNTER — Other Ambulatory Visit: Payer: Self-pay

## 2022-05-01 ENCOUNTER — Other Ambulatory Visit: Payer: Self-pay | Admitting: Physician Assistant

## 2022-05-01 DIAGNOSIS — M1611 Unilateral primary osteoarthritis, right hip: Secondary | ICD-10-CM

## 2022-05-07 NOTE — Progress Notes (Signed)
Surgical Instructions    Your procedure is scheduled on Tuesday August 1st.  Report to [facility] Main Entrance "A" at 12:30 P.M., then check in with the Admitting office.  Call this number if you have problems the morning of surgery:  [phone]   If you have any questions prior to your surgery date call [phone]: Open Monday-Friday 8am-4pm    Remember:  Do not eat after midnight the night before your surgery  You may drink clear liquids until 11:30am the morning of your surgery.   Clear liquids allowed are: Water, Non-Citrus Juices (without pulp), Carbonated Beverages, Clear Tea, Black Coffee ONLY (NO MILK, CREAM OR POWDERED CREAMER of any kind), and Gatorade   Enhanced Recovery after Surgery for Orthopedics Enhanced Recovery after Surgery is a protocol used to improve the stress on your body and your recovery after surgery.  Patient Instructions  The day of surgery (if you do NOT have diabetes):  Drink ONE (1) Pre-Surgery Clear Ensure by ___11:30__ am the morning of surgery   This drink was given to you during your hospital  pre-op appointment visit. Nothing else to drink after completing the  Pre-Surgery Clear Ensure.          If you have questions, please contact your surgeon's office.     Take these medicines the morning of surgery with A SIP OF WATER: acetaminophen (TYLENOL) 650 MG CR tablet anastrozole (ARIMIDEX) 1 MG tablet   As of today, STOP taking any Aspirin (unless otherwise instructed by your surgeon) Aleve, Naproxen, Ibuprofen, Motrin, Advil, Goody's, BC's, all herbal medications, fish oil, and all vitamins.           Do not wear jewelry or makeup Do not wear lotions, powders, perfumes, or deodorant. Do not shave 48 hours prior to surgery.   Do not bring valuables to the hospital. Do not wear nail polish, gel polish, artificial nails, or any other type of covering on natural nails (fingers and toes) If you have artificial nails or gel coating that  need to be removed by a nail salon, please have this removed prior to surgery. Artificial nails or gel coating may interfere with anesthesia's ability to adequately monitor your vital signs.  [place] is not responsible for any belongings or valuables. .   Do NOT Smoke (Tobacco/Vaping)  24 hours prior to your procedure  If you use a CPAP at night, you may bring your mask for your overnight stay.   Contacts, glasses, hearing aids, dentures or partials may not be worn into surgery, please bring cases for these belongings   For patients admitted to the hospital, discharge time will be determined by your treatment team.   Patients discharged the day of surgery will not be allowed to drive home, and someone needs to stay with them for 24 hours.   SURGICAL WAITING ROOM VISITATION Patients having surgery or a procedure may have no more than 2 support people in the waiting area - these visitors may rotate.   Children under the age of 44 must have an adult with them who is not the patient. If the patient needs to stay at the hospital during part of their recovery, the visitor guidelines for inpatient rooms apply. Pre-op nurse will coordinate an appropriate time for 1 support person to accompany patient in pre-op.  This support person may not rotate.   Please refer to the [facility] website for the visitor guidelines for Inpatients (after your surgery is over and you are in a  regular room).    Special instructions:    Oral Hygiene is also important to reduce your risk of infection.  Remember - BRUSH YOUR TEETH THE MORNING OF SURGERY WITH YOUR REGULAR TOOTHPASTE   [facility]- Preparing For Surgery  Before surgery, you can play an important role. Because skin is not sterile, your skin needs to be as free of germs as possible. You can reduce the number of germs on your skin by washing with CHG (chlorahexidine gluconate) Soap before surgery.  CHG is an antiseptic cleaner which kills germs and  bonds with the skin to continue killing germs even after washing.     Please do not use if you have an allergy to CHG or antibacterial soaps. If your skin becomes reddened/irritated stop using the CHG.  Do not shave (including legs and underarms) for at least 48 hours prior to first CHG shower. It is OK to shave your face.  Please follow these instructions carefully.     Shower the NIGHT BEFORE SURGERY and the MORNING OF SURGERY with CHG Soap.   If you chose to wash your hair, wash your hair first as usual with your normal shampoo. After you shampoo, rinse your hair and body thoroughly to remove the shampoo.  Then [employer] and genitals (private parts) with your normal soap and rinse thoroughly to remove soap.  After that Use CHG Soap as you would any other liquid soap. You can apply CHG directly to the skin and wash gently with a scrungie or a clean washcloth.   Apply the CHG Soap to your body ONLY FROM THE NECK DOWN.  Do not use on open wounds or open sores. Avoid contact with your eyes, ears, mouth and genitals (private parts). Wash Face and genitals (private parts)  with your normal soap.   Wash thoroughly, paying special attention to the area where your surgery will be performed.  Thoroughly rinse your body with warm water from the neck down.  DO NOT shower/wash with your normal soap after using and rinsing off the CHG Soap.  Pat yourself dry with a CLEAN TOWEL.  Wear CLEAN PAJAMAS to bed the night before surgery  Place CLEAN SHEETS on your bed the night before your surgery  DO NOT SLEEP WITH PETS.   Day of Surgery:  Take a shower with CHG soap. Wear Clean/Comfortable clothing the morning of surgery Do not apply any deodorants/lotions.   Remember to brush your teeth WITH YOUR REGULAR TOOTHPASTE.    If you received a COVID test during your pre-op visit, it is requested that you wear a mask when out in public, stay away from anyone that may not be feeling well, and notify  your surgeon if you develop symptoms. If you have been in contact with anyone that has tested positive in the last 10 days, please notify your surgeon.    Please read over the following fact sheets that you were given.

## 2022-05-08 ENCOUNTER — Encounter (HOSPITAL_COMMUNITY): Payer: Self-pay

## 2022-05-08 ENCOUNTER — Encounter (HOSPITAL_COMMUNITY)
Admission: RE | Admit: 2022-05-08 | Discharge: 2022-05-08 | Disposition: A | Discharge status: Home / Self Care | Payer: Medicare Other | Source: Ambulatory Visit | Attending: Orthopaedic Surgery | Admitting: Orthopaedic Surgery

## 2022-05-08 ENCOUNTER — Other Ambulatory Visit: Payer: Self-pay

## 2022-05-08 VITALS — BP 135/56 | HR 77 | Temp 98.4°F | Resp 18 | Ht 67.0 in | Wt 199.7 lb

## 2022-05-08 DIAGNOSIS — Z01818 Encounter for other preprocedural examination: Secondary | ICD-10-CM | POA: Insufficient documentation

## 2022-05-08 DIAGNOSIS — I10 Essential (primary) hypertension: Secondary | ICD-10-CM | POA: Insufficient documentation

## 2022-05-08 LAB — CBC
HCT: 39.1 % (ref 36.0–46.0)
Hemoglobin: 13 g/dL (ref 12.0–15.0)
MCH: 29 pg (ref 26.0–34.0)
MCHC: 33.2 g/dL (ref 30.0–36.0)
MCV: 87.1 fL (ref 80.0–100.0)
Platelets: 200 10*3/uL (ref 150–400)
RBC: 4.49 MIL/uL (ref 3.87–5.11)
RDW: 12.6 % (ref 11.5–15.5)
WBC: 3 10*3/uL — ABNORMAL LOW (ref 4.0–10.5)
nRBC: 0 % (ref 0.0–0.2)

## 2022-05-08 LAB — BASIC METABOLIC PANEL
Anion gap: 7 (ref 5–15)
BUN: 13 mg/dL (ref 8–23)
CO2: 25 mmol/L (ref 22–32)
Calcium: 10.4 mg/dL — ABNORMAL HIGH (ref 8.9–10.3)
Chloride: 106 mmol/L (ref 98–111)
Creatinine, Ser: 0.84 mg/dL (ref 0.44–1.00)
GFR, Estimated: >60 mL/min (ref >60)
Glucose, Bld: 116 mg/dL — ABNORMAL HIGH (ref 70–99)
Potassium: 4.1 mmol/L (ref 3.5–5.1)
Sodium: 138 mmol/L (ref 135–145)

## 2022-05-08 LAB — SURGICAL PCR SCREEN
MRSA, PCR: NEGATIVE
Staphylococcus aureus: NEGATIVE

## 2022-05-08 NOTE — Progress Notes (Signed)
PCP - [name], MD Cardiologist - Denies  PPM/ICD - Denies  Chest x-ray - NI EKG - 05/08/22 Stress Test - Denies ECHO - 10/12/18 Cardiac Cath - Denies  Sleep Study - Denies  DM -Borderline  Blood Thinner Instructions: Denies Aspirin Instructions:Denies  ERAS Protcol -Yes  PRE-SURGERY Ensure given   COVID TEST- No   Anesthesia review: No   Patient denies shortness of breath, fever, cough and chest pain at PAT appointment   All instructions explained to the patient, with a verbal understanding of the material. Patient agrees to go over the instructions while at home for a better understanding. The opportunity to ask questions was provided.

## 2022-05-13 NOTE — H&P (Signed)
TOTAL HIP ADMISSION H&P  Patient is admitted for right total hip arthroplasty.  Subjective:  Chief Complaint: right hip pain  HPI: Connie Marshall, 76 y.o. female, has a history of pain and functional disability in the right hip(s) due to arthritis and patient has failed non-surgical conservative treatments for greater than 12 weeks to include NSAID's and/or analgesics, corticosteriod injections, use of assistive devices, weight reduction as appropriate, and activity modification.  Onset of symptoms was gradual starting 2 years ago with gradually worsening course since that time.The patient noted no past surgery on the right hip(s).  Patient currently rates pain in the right hip at 10 out of 10 with activity. Patient has night pain, worsening of pain with activity and weight bearing, trendelenberg gait, pain that interfers with activities of daily living, and pain with passive range of motion. Patient has evidence of subchondral cysts, subchondral sclerosis, periarticular osteophytes, and joint space narrowing by imaging studies. This condition presents safety issues increasing the risk of falls.  There is no current active infection.  Patient Active Problem List   Diagnosis Date Noted   Unilateral primary osteoarthritis, right hip 03/27/2022   Preop exam for internal medicine 01/16/2022   Insect bite, infected 02/19/2021   Shingles outbreak 02/19/2021   Dysuria 05/16/2020   Right knee pain 05/16/2020   Carcinoma of upper-outer quadrant of left breast in female, estrogen receptor positive ([place]) 08/25/2019   Bilateral breast cancer ([place]) 08/17/2019   Skin nodule 05/11/2019   B12 deficiency 05/11/2019   Vitamin D deficiency 05/11/2019   Acute lymphadenitis 07/16/2017   Eustachian tube dysfunction, bilateral 07/16/2017   Prolapse of female bladder, acquired 03/25/2017   Sudden right hearing loss 03/25/2017   Left otitis media 03/25/2017   Rash 11/07/2015   Abnormal mammogram 09/13/2015    Blister of toe of left foot 04/13/2015   Breast discharge 04/13/2015   Encounter for well adult exam with abnormal findings 08/06/2011   Microscopic hematuria 04/27/2010   ANXIETY 03/25/2009   PARESTHESIA 03/23/2009   PERIPHERAL EDEMA 03/23/2009   HEMORRHOIDS, INTERNAL 02/13/2008   DIVERTICULOSIS, COLON 02/13/2008   IBS 02/13/2008   CHOLECYSTECTOMY, LAPAROSCOPIC, HX OF 02/13/2008   OBESITY NOS 07/07/2007   Diabetes ([place]) 07/06/2007   Hyperlipidemia 07/06/2007   Essential hypertension 07/06/2007   Past Medical History:  Diagnosis Date   Axillary mass, left    Cancer ([facility])    Complication of anesthesia    Diverticulosis    Hemorrhoids    Hyperlipidemia    Hypertension    IBS (irritable bowel syndrome)    Obesity    PONV (postoperative nausea and vomiting)    Pre-diabetes    Psoriasis    Shingles 02/19/2021    Past Surgical History:  Procedure Laterality Date   BREAST LUMPECTOMY WITH RADIOACTIVE SEED AND SENTINEL LYMPH NODE BIOPSY Bilateral 10/05/2019   Procedure: BILATERAL BREAST LUMPECTOMY WITH BILATERAL RADIOACTIVE SEEDS AND LEFT SENTINEL LYMPH NODE BIOPSY;  Surgeon: Coralie Keens, MD;  Location: [facility];  Service: General;  Laterality: Bilateral;   BREAST SURGERY     CESAREAN SECTION     2 times   CHOLECYSTECTOMY     laproscopic   CHOLECYSTECTOMY, LAPAROSCOPIC     kidney stone removed  10/14/2009   MASS EXCISION Left 07/12/2019   Procedure: EXCISION LEFT AXILLARY MASS;  Surgeon: Coralie Keens, MD;  Location: [facility];  Service: General;  Laterality: Left;   RE-EXCISION OF BREAST LUMPECTOMY Left 10/05/2019   Procedure: RE-EXCISION OF LEFT  BREAST CANCER;  Surgeon: Coralie Keens, MD;  Location: [facility];  Service: General;  Laterality: Left;   THYROGLOSSAL DUCT CYST  10/14/1998   TONSILLECTOMY  54/65/0354   umbilical herniorrhapy     VAGINAL HYSTERECTOMY  10/14/2001    No current facility-administered  medications for this encounter.   Current Outpatient Medications  Medication Sig Dispense Refill Last Dose   acetaminophen (TYLENOL) 650 MG CR tablet Take 1,300 mg by mouth 2 (two) times daily.      anastrozole (ARIMIDEX) 1 MG tablet TAKE 1 TABLET(1 MG) BY MOUTH DAILY 90 tablet 3    B Complex-C (SUPER B COMPLEX PO) Take 1 capsule by mouth daily.      Cholecalciferol (VITAMIN D3 SUPER STRENGTH) 50 MCG (2000 UT) TABS Take 4,000 Units by mouth daily.      fosinopril (MONOPRIL) 20 MG tablet TAKE 1 TABLET(20 MG) BY MOUTH DAILY 90 tablet 2    furosemide (LASIX) 40 MG tablet 1 tab by mouth in the AM, and 1 in the PM as needed for leg swelling 180 tablet 3    Misc Natural Products (TURMERIC CURCUMIN) CAPS Take 1 capsule by mouth daily. Take for immune system      OVER THE COUNTER MEDICATION Apply 1 Application topically daily. Cortisone cream      simvastatin (ZOCOR) 20 MG tablet Take 1 tablet (20 mg total) by mouth every evening. 90 tablet 3    traMADol (ULTRAM) 50 MG tablet Take 1 tablet (50 mg total) by mouth every 6 (six) hours as needed. (Patient not taking: Reported on 04/08/2022) 30 tablet 0 Not Taking   Allergies  Allergen Reactions   Tape     ALLERGIC TO NON-ALLERGIC TAPE-causes a rash   Silicone Rash    Social History   Tobacco Use   Smoking status: Never   Smokeless tobacco: Never  Substance Use Topics   Alcohol use: No    Family History  Problem Relation Age of Onset   Hypertension Mother    Diabetes Father    Hypertension Father    Parkinson's disease Sister    Prostate cancer Brother      Review of Systems  Musculoskeletal:  Positive for gait problem.  All other systems reviewed and are negative.   Objective:  Physical Exam Vitals reviewed.  Constitutional:      Appearance: Normal appearance.  HENT:     Head: Normocephalic and atraumatic.  Eyes:     Extraocular Movements: Extraocular movements intact.     Pupils: Pupils are equal, round, and reactive to  light.  Cardiovascular:     Rate and Rhythm: Normal rate and regular rhythm.     Pulses: Normal pulses.  Pulmonary:     Effort: Pulmonary effort is normal.     Breath sounds: Normal breath sounds.  Abdominal:     Palpations: Abdomen is soft.  Musculoskeletal:     Cervical back: Normal range of motion and neck supple.     Right hip: Tenderness and bony tenderness present. Decreased range of motion. Decreased strength.  Neurological:     Mental Status: She is alert and oriented to person, place, and time.  Psychiatric:        Behavior: Behavior normal.     Vital signs in last 24 hours:    Labs:   Estimated body mass index is 31.28 kg/m as calculated from the following:   Height as of 05/08/22: '5\' 7"'$  (1.702 m).   Weight as of 05/08/22: 90.6  kg.   Imaging Review Plain radiographs demonstrate severe degenerative joint disease of the right hip(s). The bone quality appears to be good for age and reported activity level.      Assessment/Plan:  End stage arthritis, right hip(s)  The patient history, physical examination, clinical judgement of the provider and imaging studies are consistent with end stage degenerative joint disease of the right hip(s) and total hip arthroplasty is deemed medically necessary. The treatment options including medical management, injection therapy, arthroscopy and arthroplasty were discussed at length. The risks and benefits of total hip arthroplasty were presented and reviewed. The risks due to aseptic loosening, infection, stiffness, dislocation/subluxation,  thromboembolic complications and other imponderables were discussed.  The patient acknowledged the explanation, agreed to proceed with the plan and consent was signed. Patient is being admitted for inpatient treatment for surgery, pain control, PT, OT, prophylactic antibiotics, VTE prophylaxis, progressive ambulation and ADL's and discharge planning.The patient is planning to be discharged home with  home health services

## 2022-05-13 NOTE — Progress Notes (Signed)
Pt made aware of surgery time change 1300-1440, arrival 1100, finish pre-surgery ensure drink by 1000.

## 2022-05-14 ENCOUNTER — Ambulatory Visit (HOSPITAL_BASED_OUTPATIENT_CLINIC_OR_DEPARTMENT_OTHER): Payer: Medicare Other | Admitting: Anesthesiology

## 2022-05-14 ENCOUNTER — Observation Stay (HOSPITAL_COMMUNITY): Payer: Medicare Other

## 2022-05-14 ENCOUNTER — Ambulatory Visit (HOSPITAL_COMMUNITY): Payer: Medicare Other

## 2022-05-14 ENCOUNTER — Encounter (HOSPITAL_COMMUNITY): Payer: Self-pay | Admitting: Orthopaedic Surgery

## 2022-05-14 ENCOUNTER — Encounter (HOSPITAL_COMMUNITY)
Admission: RE | Disposition: A | Discharge status: Skilled Nursing Facility | Payer: Self-pay | Source: Home / Self Care | Attending: Orthopaedic Surgery

## 2022-05-14 ENCOUNTER — Ambulatory Visit (HOSPITAL_COMMUNITY): Payer: Medicare Other | Admitting: Anesthesiology

## 2022-05-14 ENCOUNTER — Other Ambulatory Visit: Payer: Self-pay

## 2022-05-14 ENCOUNTER — Inpatient Hospital Stay (HOSPITAL_COMMUNITY)
Admission: RE | Admit: 2022-05-14 | Discharge: 2022-05-16 | DRG: 470 | Disposition: A | Discharge status: Skilled Nursing Facility | Payer: Medicare Other | Attending: Orthopaedic Surgery | Admitting: Orthopaedic Surgery

## 2022-05-14 DIAGNOSIS — M1611 Unilateral primary osteoarthritis, right hip: Principal | ICD-10-CM | POA: Diagnosis present

## 2022-05-14 DIAGNOSIS — E119 Type 2 diabetes mellitus without complications: Secondary | ICD-10-CM | POA: Diagnosis present

## 2022-05-14 DIAGNOSIS — Z853 Personal history of malignant neoplasm of breast: Secondary | ICD-10-CM

## 2022-05-14 DIAGNOSIS — K589 Irritable bowel syndrome without diarrhea: Secondary | ICD-10-CM | POA: Diagnosis present

## 2022-05-14 DIAGNOSIS — Z8249 Family history of ischemic heart disease and other diseases of the circulatory system: Secondary | ICD-10-CM

## 2022-05-14 DIAGNOSIS — Z79811 Long term (current) use of aromatase inhibitors: Secondary | ICD-10-CM

## 2022-05-14 DIAGNOSIS — Z6831 Body mass index (BMI) 31.0-31.9, adult: Secondary | ICD-10-CM

## 2022-05-14 DIAGNOSIS — Z96641 Presence of right artificial hip joint: Secondary | ICD-10-CM

## 2022-05-14 DIAGNOSIS — E669 Obesity, unspecified: Secondary | ICD-10-CM | POA: Diagnosis present

## 2022-05-14 DIAGNOSIS — Z79899 Other long term (current) drug therapy: Secondary | ICD-10-CM

## 2022-05-14 DIAGNOSIS — I10 Essential (primary) hypertension: Secondary | ICD-10-CM | POA: Diagnosis present

## 2022-05-14 DIAGNOSIS — E785 Hyperlipidemia, unspecified: Secondary | ICD-10-CM | POA: Diagnosis present

## 2022-05-14 DIAGNOSIS — Z833 Family history of diabetes mellitus: Secondary | ICD-10-CM

## 2022-05-14 DIAGNOSIS — Z91048 Other nonmedicinal substance allergy status: Secondary | ICD-10-CM

## 2022-05-14 HISTORY — PX: TOTAL HIP ARTHROPLASTY: SHX124

## 2022-05-14 LAB — TYPE AND SCREEN
ABO/RH(D): O POS
Antibody Screen: NEGATIVE

## 2022-05-14 LAB — ABO/RH: ABO/RH(D): O POS

## 2022-05-14 SURGERY — ARTHROPLASTY, HIP, TOTAL, ANTERIOR APPROACH
Anesthesia: Monitor Anesthesia Care | Site: Hip | Laterality: Right

## 2022-05-14 MED ORDER — ONDANSETRON HCL 4 MG PO TABS: 4.0000 mg | ORAL_TABLET | Freq: Four times a day (QID) | ORAL | Status: DC | PRN

## 2022-05-14 MED ORDER — METHOCARBAMOL 500 MG PO TABS
500.0000 mg | ORAL_TABLET | Freq: Four times a day (QID) | ORAL | Status: DC | PRN
  Administered 2022-05-14 – 2022-05-15 (×2): 500 mg via ORAL
  Filled 2022-05-14 (×2): qty 1

## 2022-05-14 MED ORDER — DOCUSATE SODIUM 100 MG PO CAPS
100.0000 mg | ORAL_CAPSULE | Freq: Two times a day (BID) | ORAL | Status: DC
  Administered 2022-05-14 – 2022-05-16 (×4): 100 mg via ORAL
  Filled 2022-05-14 (×4): qty 1

## 2022-05-14 MED ORDER — FENTANYL CITRATE (PF) 250 MCG/5ML IJ SOLN
INTRAMUSCULAR | Status: DC | PRN
  Administered 2022-05-14 (×2): 50 ug via INTRAVENOUS

## 2022-05-14 MED ORDER — SODIUM CHLORIDE 0.9 % IR SOLN
Status: DC | PRN
  Administered 2022-05-14: 1000 mL

## 2022-05-14 MED ORDER — METOCLOPRAMIDE HCL 5 MG/ML IJ SOLN: 5.0000 mg | Freq: Three times a day (TID) | INTRAMUSCULAR | Status: DC | PRN

## 2022-05-14 MED ORDER — OXYCODONE HCL 5 MG PO TABS: 5.0000 mg | ORAL_TABLET | Freq: Once | ORAL | Status: DC | PRN

## 2022-05-14 MED ORDER — METHOCARBAMOL 1000 MG/10ML IJ SOLN: 500.0000 mg | Freq: Four times a day (QID) | INTRAVENOUS | Status: DC | PRN

## 2022-05-14 MED ORDER — VITAMIN D3 25 MCG (1000 UNIT) PO TABS
4000.0000 [IU] | ORAL_TABLET | Freq: Every day | ORAL | Status: DC
  Administered 2022-05-15 – 2022-05-16 (×2): 4000 [IU] via ORAL
  Filled 2022-05-14 (×4): qty 4

## 2022-05-14 MED ORDER — ACETAMINOPHEN 10 MG/ML IV SOLN
1000.0000 mg | Freq: Once | INTRAVENOUS | Status: AC
Start: 2022-05-14 — End: 2022-05-14
  Administered 2022-05-14: 1000 mg via INTRAVENOUS

## 2022-05-14 MED ORDER — ONDANSETRON HCL 4 MG/2ML IJ SOLN
INTRAMUSCULAR | Status: AC
  Filled 2022-05-14: qty 4

## 2022-05-14 MED ORDER — CEFAZOLIN SODIUM-DEXTROSE 2-4 GM/100ML-% IV SOLN
2.0000 g | INTRAVENOUS | Status: AC
  Administered 2022-05-14: 2 g via INTRAVENOUS

## 2022-05-14 MED ORDER — ACETAMINOPHEN 325 MG PO TABS
325.0000 mg | ORAL_TABLET | Freq: Four times a day (QID) | ORAL | Status: DC | PRN
  Administered 2022-05-15: 650 mg via ORAL
  Filled 2022-05-14: qty 2

## 2022-05-14 MED ORDER — POVIDONE-IODINE 10 % EX SWAB
2.0000 | Freq: Once | CUTANEOUS | Status: AC
  Administered 2022-05-14: 2 via TOPICAL

## 2022-05-14 MED ORDER — ONDANSETRON HCL 4 MG/2ML IJ SOLN: 4.0000 mg | Freq: Once | INTRAMUSCULAR | Status: DC | PRN

## 2022-05-14 MED ORDER — METOCLOPRAMIDE HCL 5 MG PO TABS: 5.0000 mg | ORAL_TABLET | Freq: Three times a day (TID) | ORAL | Status: DC | PRN

## 2022-05-14 MED ORDER — PHENOL 1.4 % MT LIQD: 1.0000 | OROMUCOSAL | Status: DC | PRN

## 2022-05-14 MED ORDER — MENTHOL 3 MG MT LOZG
1.0000 | LOZENGE | OROMUCOSAL | Status: DC | PRN
Start: 2022-05-14 — End: 2022-05-16

## 2022-05-14 MED ORDER — LACTATED RINGERS IV SOLN: INTRAVENOUS | Status: DC

## 2022-05-14 MED ORDER — ORAL CARE MOUTH RINSE: 15.0000 mL | Freq: Once | OROMUCOSAL | Status: AC

## 2022-05-14 MED ORDER — PROPOFOL 500 MG/50ML IV EMUL
INTRAVENOUS | Status: DC | PRN
  Administered 2022-05-14: 50 ug/kg/min via INTRAVENOUS

## 2022-05-14 MED ORDER — CHLORHEXIDINE GLUCONATE 0.12 % MT SOLN
15.0000 mL | Freq: Once | OROMUCOSAL | Status: AC
  Administered 2022-05-14: 15 mL via OROMUCOSAL
  Filled 2022-05-14: qty 15

## 2022-05-14 MED ORDER — SODIUM CHLORIDE 0.9 % IV SOLN: INTRAVENOUS | Status: DC

## 2022-05-14 MED ORDER — TRANEXAMIC ACID-NACL 1000-0.7 MG/100ML-% IV SOLN
1000.0000 mg | INTRAVENOUS | Status: AC
  Administered 2022-05-14: 1000 mg via INTRAVENOUS

## 2022-05-14 MED ORDER — ANASTROZOLE 1 MG PO TABS
1.0000 mg | ORAL_TABLET | Freq: Every day | ORAL | Status: DC
  Administered 2022-05-15 – 2022-05-16 (×2): 1 mg via ORAL
  Filled 2022-05-14 (×2): qty 1

## 2022-05-14 MED ORDER — PANTOPRAZOLE SODIUM 40 MG PO TBEC
40.0000 mg | DELAYED_RELEASE_TABLET | Freq: Every day | ORAL | Status: DC
  Administered 2022-05-14 – 2022-05-16 (×3): 40 mg via ORAL
  Filled 2022-05-14 (×3): qty 1

## 2022-05-14 MED ORDER — SIMVASTATIN 20 MG PO TABS
20.0000 mg | ORAL_TABLET | Freq: Every evening | ORAL | Status: DC
  Administered 2022-05-14 – 2022-05-15 (×2): 20 mg via ORAL
  Filled 2022-05-14 (×2): qty 1

## 2022-05-14 MED ORDER — HYDROMORPHONE HCL 1 MG/ML IJ SOLN
0.2500 mg | INTRAMUSCULAR | Status: DC | PRN
  Administered 2022-05-14: 0.5 mg via INTRAVENOUS
  Administered 2022-05-14 (×2): 0.25 mg via INTRAVENOUS
  Administered 2022-05-14: 0.5 mg via INTRAVENOUS

## 2022-05-14 MED ORDER — FUROSEMIDE 40 MG PO TABS
40.0000 mg | ORAL_TABLET | Freq: Every day | ORAL | Status: DC
  Administered 2022-05-15 – 2022-05-16 (×2): 40 mg via ORAL
  Filled 2022-05-14 (×2): qty 1

## 2022-05-14 MED ORDER — MORPHINE SULFATE (PF) 2 MG/ML IV SOLN
0.5000 mg | INTRAVENOUS | Status: DC | PRN
  Administered 2022-05-14: 0.5 mg via INTRAVENOUS
  Administered 2022-05-15 – 2022-05-16 (×3): 1 mg via INTRAVENOUS
  Filled 2022-05-14 (×4): qty 1

## 2022-05-14 MED ORDER — CEFAZOLIN SODIUM-DEXTROSE 1-4 GM/50ML-% IV SOLN
1.0000 g | Freq: Four times a day (QID) | INTRAVENOUS | Status: AC
  Administered 2022-05-14 – 2022-05-15 (×2): 1 g via INTRAVENOUS
  Filled 2022-05-14 (×2): qty 50

## 2022-05-14 MED ORDER — HYDROMORPHONE HCL 1 MG/ML IJ SOLN
INTRAMUSCULAR | Status: AC
  Filled 2022-05-14: qty 1

## 2022-05-14 MED ORDER — ALUM & MAG HYDROXIDE-SIMETH 200-200-20 MG/5ML PO SUSP: 30.0000 mL | ORAL | Status: DC | PRN

## 2022-05-14 MED ORDER — ASPIRIN 81 MG PO CHEW
81.0000 mg | CHEWABLE_TABLET | Freq: Two times a day (BID) | ORAL | Status: DC
  Administered 2022-05-14 – 2022-05-16 (×4): 81 mg via ORAL
  Filled 2022-05-14 (×4): qty 1

## 2022-05-14 MED ORDER — AMISULPRIDE (ANTIEMETIC) 5 MG/2ML IV SOLN
INTRAVENOUS | Status: AC
  Filled 2022-05-14: qty 4

## 2022-05-14 MED ORDER — ONDANSETRON HCL 4 MG/2ML IJ SOLN
INTRAMUSCULAR | Status: DC | PRN
  Administered 2022-05-14: 4 mg via INTRAVENOUS

## 2022-05-14 MED ORDER — CEFAZOLIN SODIUM-DEXTROSE 2-4 GM/100ML-% IV SOLN
INTRAVENOUS | Status: AC
  Filled 2022-05-14: qty 100

## 2022-05-14 MED ORDER — HYDROCODONE-ACETAMINOPHEN 7.5-325 MG PO TABS
1.0000 | ORAL_TABLET | ORAL | Status: DC | PRN
  Administered 2022-05-15: 2 via ORAL
  Filled 2022-05-14: qty 2

## 2022-05-14 MED ORDER — PROPOFOL 10 MG/ML IV BOLUS
INTRAVENOUS | Status: DC | PRN
  Administered 2022-05-14: 20 mg via INTRAVENOUS

## 2022-05-14 MED ORDER — ACETAMINOPHEN 500 MG PO TABS
1000.0000 mg | ORAL_TABLET | Freq: Once | ORAL | Status: DC
  Filled 2022-05-14: qty 2

## 2022-05-14 MED ORDER — ACETAMINOPHEN 10 MG/ML IV SOLN
INTRAVENOUS | Status: AC
  Filled 2022-05-14: qty 100

## 2022-05-14 MED ORDER — AMISULPRIDE (ANTIEMETIC) 5 MG/2ML IV SOLN
10.0000 mg | Freq: Once | INTRAVENOUS | Status: AC | PRN
  Administered 2022-05-14: 10 mg via INTRAVENOUS

## 2022-05-14 MED ORDER — OXYCODONE HCL 5 MG/5ML PO SOLN: 5.0000 mg | Freq: Once | ORAL | Status: DC | PRN

## 2022-05-14 MED ORDER — DIPHENHYDRAMINE HCL 12.5 MG/5ML PO ELIX: 12.5000 mg | ORAL_SOLUTION | ORAL | Status: DC | PRN

## 2022-05-14 MED ORDER — 0.9 % SODIUM CHLORIDE (POUR BTL) OPTIME
TOPICAL | Status: DC | PRN
  Administered 2022-05-14: 1000 mL

## 2022-05-14 MED ORDER — PHENYLEPHRINE HCL-NACL 20-0.9 MG/250ML-% IV SOLN
INTRAVENOUS | Status: DC | PRN
  Administered 2022-05-14: 30 ug/min via INTRAVENOUS

## 2022-05-14 MED ORDER — FENTANYL CITRATE (PF) 250 MCG/5ML IJ SOLN
INTRAMUSCULAR | Status: AC
  Filled 2022-05-14: qty 5

## 2022-05-14 MED ORDER — ONDANSETRON HCL 4 MG/2ML IJ SOLN
4.0000 mg | Freq: Four times a day (QID) | INTRAMUSCULAR | Status: DC | PRN
  Administered 2022-05-14: 4 mg via INTRAVENOUS
  Filled 2022-05-14: qty 2

## 2022-05-14 MED ORDER — BUPIVACAINE IN DEXTROSE 0.75-8.25 % IT SOLN
INTRATHECAL | Status: DC | PRN
  Administered 2022-05-14: 1.8 mL via INTRATHECAL

## 2022-05-14 MED ORDER — TRANEXAMIC ACID-NACL 1000-0.7 MG/100ML-% IV SOLN
INTRAVENOUS | Status: AC
  Filled 2022-05-14: qty 100

## 2022-05-14 MED ORDER — HYDROCODONE-ACETAMINOPHEN 5-325 MG PO TABS
1.0000 | ORAL_TABLET | ORAL | Status: DC | PRN
  Administered 2022-05-14 – 2022-05-15 (×3): 2 via ORAL
  Administered 2022-05-16: 1 via ORAL
  Filled 2022-05-14 (×2): qty 2
  Filled 2022-05-14: qty 1
  Filled 2022-05-14 (×2): qty 2

## 2022-05-14 SURGICAL SUPPLY — 52 items
ACETAB CUP W/GRIPTION 54 (Plate) ×2 IMPLANT
BAG COUNTER SPONGE SURGICOUNT (BAG) ×3 IMPLANT
BAG SPNG CNTER NS LX DISP (BAG) ×1
BLADE CLIPPER SURG (BLADE) IMPLANT
BLADE SAW SGTL 18X1.27X75 (BLADE) ×3 IMPLANT
COVER SURGICAL LIGHT HANDLE (MISCELLANEOUS) ×3 IMPLANT
CUP ACETAB W/GRIPTION 54 (Plate) IMPLANT
DRAPE C-ARM 42X72 X-RAY (DRAPES) ×3 IMPLANT
DRAPE STERI IOBAN 125X83 (DRAPES) ×3 IMPLANT
DRAPE U-SHAPE 47X51 STRL (DRAPES) ×9 IMPLANT
DRSG AQUACEL AG ADV 3.5X10 (GAUZE/BANDAGES/DRESSINGS) ×3 IMPLANT
DURAPREP 26ML APPLICATOR (WOUND CARE) ×3 IMPLANT
ELECT BLADE 4.0 EZ CLEAN MEGAD (MISCELLANEOUS) ×2
ELECT BLADE 6.5 EXT (BLADE) IMPLANT
ELECT REM PT RETURN 9FT ADLT (ELECTROSURGICAL) ×2
ELECTRODE BLDE 4.0 EZ CLN MEGD (MISCELLANEOUS) ×2 IMPLANT
ELECTRODE REM PT RTRN 9FT ADLT (ELECTROSURGICAL) ×2 IMPLANT
FACESHIELD WRAPAROUND (MASK) ×8 IMPLANT
FACESHIELD WRAPAROUND OR TEAM (MASK) ×4 IMPLANT
GLOVE BIOGEL PI IND STRL 8 (GLOVE) ×4 IMPLANT
GLOVE BIOGEL PI INDICATOR 8 (GLOVE) ×2
GLOVE ECLIPSE 8.0 STRL XLNG CF (GLOVE) ×3 IMPLANT
GLOVE ORTHO TXT STRL SZ7.5 (GLOVE) ×6 IMPLANT
GOWN STRL REUS W/ TWL LRG LVL3 (GOWN DISPOSABLE) ×4 IMPLANT
GOWN STRL REUS W/ TWL XL LVL3 (GOWN DISPOSABLE) ×4 IMPLANT
GOWN STRL REUS W/TWL LRG LVL3 (GOWN DISPOSABLE) ×4
GOWN STRL REUS W/TWL XL LVL3 (GOWN DISPOSABLE) ×4
HANDPIECE INTERPULSE COAX TIP (DISPOSABLE) ×2
HEAD M SROM 36MM PLUS 1.5 (Hips) IMPLANT
KIT BASIN OR (CUSTOM PROCEDURE TRAY) ×3 IMPLANT
KIT TURNOVER KIT B (KITS) ×3 IMPLANT
LINER NEUTRAL 54X36MM PLUS 4 (Hips) ×1 IMPLANT
MANIFOLD NEPTUNE II (INSTRUMENTS) ×3 IMPLANT
NS IRRIG 1000ML POUR BTL (IV SOLUTION) ×3 IMPLANT
PACK TOTAL JOINT (CUSTOM PROCEDURE TRAY) ×3 IMPLANT
PAD ARMBOARD 7.5X6 YLW CONV (MISCELLANEOUS) ×3 IMPLANT
SCREW 6.5MMX25MM (Screw) ×1 IMPLANT
SET HNDPC FAN SPRY TIP SCT (DISPOSABLE) ×2 IMPLANT
SROM M HEAD 36MM PLUS 1.5 (Hips) ×2 IMPLANT
STAPLER VISISTAT 35W (STAPLE) IMPLANT
STEM FEMORAL SZ 6MM STD ACTIS (Stem) ×1 IMPLANT
SUT ETHIBOND NAB CT1 #1 30IN (SUTURE) ×3 IMPLANT
SUT MNCRL AB 4-0 PS2 18 (SUTURE) IMPLANT
SUT VIC AB 0 CT1 27 (SUTURE) ×2
SUT VIC AB 0 CT1 27XBRD ANBCTR (SUTURE) ×2 IMPLANT
SUT VIC AB 1 CT1 27 (SUTURE) ×2
SUT VIC AB 1 CT1 27XBRD ANBCTR (SUTURE) ×2 IMPLANT
SUT VIC AB 2-0 CT1 27 (SUTURE) ×4
SUT VIC AB 2-0 CT1 TAPERPNT 27 (SUTURE) ×2 IMPLANT
TOWEL GREEN STERILE (TOWEL DISPOSABLE) ×3 IMPLANT
TOWEL GREEN STERILE FF (TOWEL DISPOSABLE) ×3 IMPLANT
WATER STERILE IRR 1000ML POUR (IV SOLUTION) ×5 IMPLANT

## 2022-05-14 NOTE — Transfer of Care (Signed)
Immediate Anesthesia Transfer of Care Note  Patient: Connie Marshall  Procedure(s) Performed: RIGHT TOTAL HIP ARTHROPLASTY ANTERIOR APPROACH (Right: Hip)  Patient Location: PACU  Anesthesia Type:MAC and Spinal  Level of Consciousness: awake, alert  and oriented  Airway & Oxygen Therapy: Patient Spontanous Breathing and Patient connected to nasal cannula oxygen  Post-op Assessment: Report given to RN and Post -op Vital signs reviewed and stable  Post vital signs: Reviewed and stable  Last Vitals:  Vitals Value Taken Time  BP 102/64   Temp    Pulse 73   RR 12   SpO2 99     Last Pain:  Vitals:   05/14/22 1201  TempSrc:   PainSc: 10-Worst pain ever         Complications: No notable events documented.

## 2022-05-14 NOTE — Op Note (Signed)
Operative Note  Date of operation: 05/14/2022 Preoperative diagnosis: Osteoarthritis and degenerative joint disease right hip Postoperative diagnosis: Same  Procedure: Right direct anterior hip replacement  Implants: DePuy Sectra GRIPTION acetabular component size 54 with a single dome screw, size 36+4 polyethylene liner, size 6 Actis femoral component with standard offset, size 36+1.5 metal hip ball  Surgeon: Lind Guest. Ninfa Linden, MD Assistant: Benita Stabile, PA-C  Anesthesia: Spinal EBL: 300 cc Antibiotics: 2 g IV Ancef Complications: None  Indications: The patient is a very pleasant 76 year old female with well-documented severe end-stage arthritis of her right hip.  Her pain is debilitating and it is on a daily basis.  Her right hip pain is detrimentally affecting her mobility, her quality of life, and her actives daily living to the point she wishes to proceed with a total hip arthroplasty in order agree with this as well.  She has tried and failed conservative treatment for well over a year.  She understands with surgery there is a risk of acute blood loss anemia, nerve or vessel injury, fracture, infection, DVT, dislocation, implant failure, leg length differences.  She understands her goals are to decrease pain, improve mobility, and improve quality of life.  Procedure description: After informed consent was obtained and appropriate right hip was marked, the patient was brought to the operating room where spinal anesthesia was obtained while she was on her stretcher.  A Foley catheter was placed.  Traction boots were placed on both her feet and she was placed supine on the Hana fracture table with a perineal post in place in both legs and inline skeletal traction devices but no traction applied.  Her right operative hip was assessed radiographically.  It was then prepped and draped with DuraPrep and sterile drapes.  A timeout was called and she identified as the correct patient and the  correct right hip.  An incision was then made inferior and posterior to the anterior superior iliac spine and carried this obliquely down the leg.  Dissection was made down to the tensor fascia lata muscle and the tensor fascia was then divided longitudinally to proceed with direct anterior approach to the hip.  The circumflex vessels were identified and cauterized.  The joint capsule was opened and a large effusion was encountered.  Cobra retractors were placed around the medial and lateral femoral neck and a femoral neck cut was made with an oscillating saw just proximal to the lesser trochanter and completed with an osteotome.  A corkscrew guide was placed in the femoral head and the femoral head was removed in its entirety and found a wide area devoid of cartilage.  A bent Hohmann was placed over the medial acetabular rim and remnants of the acetabular labrum and other debris were removed.  We then began reaming under direct visitation from a size 43 reamer and stepwise increments going up to a size 53 reamer with all reamers placed under regularization and the last reamer was placed under direct fluoroscopy to the gain our depth of reaming, our inclination, and our anteversion.  We then placed the real DePuy sector GRIPTION acetabular component size 54 and a single dome screw.  A 36+4 neutral polyliner was then placed.  Attention was then turned to the femur.  With the leg externally rotated, extended, and a deducted, we were able to place a Mueller retractor medially and a Hohmann retractor behind the greater trochanter.  A box cutting osteotome was used to enter the femoral canal and broaching was then started  from a size 0 broach going all the way up to a size 6 broach.  With a size 6 broach in place a standard offset femoral neck was trialed as well as a 36+1.5 trial hip ball.  This was reduced in the pelvis and we are pleased with leg length, offset, range of motion and stability assessed both  radiographically and clinically.  We then removed the trial components and placed the real Actis femoral component from DePuy size 6 with standard offset, and we placed the real 36+1.5 metal hip ball.  Again this is reduced into the acetabulum and we are pleased with leg length, offset, range of motion and stability assessed again both clinically and radiographically.  The soft tissue was then irrigated with normal saline solution.  The joint capsule was closed with interrupted #1 Ethibond suture followed by #1 Vicryl to close the tensor fascia.  0 Vicryl is used close the deep tissue and 2-0 Vicryl was used to close subcutaneous tissue.  The skin was closed with staples.  The patient was taken to recovery in stable addition with all final counts being correct and no complications noted.  Of note go-cart PA-C assisted during the entire case and his assistance was crucial and medically necessary for soft tissue retraction, guiding implant placement and a layered closure of the wound.

## 2022-05-14 NOTE — Anesthesia Procedure Notes (Signed)
Spinal  Patient location during procedure: OR Start time: 05/14/2022 12:57 PM End time: 05/14/2022 1:03 PM Reason for block: surgical anesthesia Staffing Performed: anesthesiologist  Anesthesiologist: Pervis Hocking, DO Performed by: Pervis Hocking, DO Authorized by: Pervis Hocking, DO   Preanesthetic Checklist Completed: patient identified, IV checked, risks and benefits discussed, surgical consent, monitors and equipment checked, pre-op evaluation and timeout performed Spinal Block Patient position: sitting Prep: DuraPrep and site prepped and draped Patient monitoring: cardiac monitor, continuous pulse ox and blood pressure Approach: midline Location: L3-4 Injection technique: single-shot Needle Needle type: Pencan  Needle gauge: 24 G Needle length: 9 cm Assessment Sensory level: T6 Events: CSF return Additional Notes Functioning IV was confirmed and monitors were applied. Sterile prep and drape, including hand hygiene and sterile gloves were used. The patient was positioned and the spine was prepped. The skin was anesthetized with lidocaine.  Free flow of clear CSF was obtained prior to injecting local anesthetic into the CSF.  The spinal needle aspirated freely following injection.  The needle was carefully withdrawn.  The patient tolerated the procedure well.

## 2022-05-14 NOTE — Interval H&P Note (Signed)
History and Physical Interval Note: The patient understands that she is here today for right hip replacement to treat her right hip osteoarthritis.  There has been no acute or interval change in her medical status.  See H&P.  The risks and benefits of surgery been explained in detail and informed consent is obtained.  The right operative hip has been marked.  05/14/2022 11:36 AM  Connie Marshall  has presented today for surgery, with the diagnosis of osteoarthritis right hip.  The various methods of treatment have been discussed with the patient and family. After consideration of risks, benefits and other options for treatment, the patient has consented to  Procedure(s): RIGHT TOTAL HIP ARTHROPLASTY ANTERIOR APPROACH (Right) as a surgical intervention.  The patient's history has been reviewed, patient examined, no change in status, stable for surgery.  I have reviewed the patient's chart and labs.  Questions were answered to the patient's satisfaction.     Mcarthur Rossetti

## 2022-05-14 NOTE — Anesthesia Procedure Notes (Signed)
Procedure Name: MAC Date/Time: 05/14/2022 1:23 PM  Performed by: Darletta Moll, CRNAPre-anesthesia Checklist: Patient identified, Emergency Drugs available, Suction available and Patient being monitored Patient Re-evaluated:Patient Re-evaluated prior to induction Oxygen Delivery Method: Nasal cannula

## 2022-05-14 NOTE — Anesthesia Preprocedure Evaluation (Addendum)
Anesthesia Evaluation  Patient identified by MRN, date of birth, ID band Patient awake    Reviewed: Allergy & Precautions, NPO status , Patient's Chart, lab work & pertinent test results  History of Anesthesia Complications (+) PONV and history of anesthetic complications (does well w/ scop patch)  Airway Mallampati: III  TM Distance: >3 FB Neck ROM: Full    Dental  (+) Teeth Intact, Dental Advisory Given   Pulmonary neg pulmonary ROS,    Pulmonary exam normal breath sounds clear to auscultation       Cardiovascular hypertension (157/76 in preop, normally 120s SBP), Pt. on medications +CHF (grade 1 diastolic dysfunction)  Normal cardiovascular exam Rhythm:Regular Rate:Normal  Echo 2019 - Left ventricle: The cavity size was normal. Wall thickness was  increased in a pattern of mild LVH. There was mild focal basal  hypertrophy of the septum. Systolic function was normal. The  estimated ejection fraction was in the range of 55% to 60%. Wall  motion was normal; there were no regional wall motion  abnormalities. Doppler parameters are consistent with abnormal  left ventricular relaxation (grade 1 diastolic dysfunction).  - Atrial septum: There was increased thickness of the septum,  consistent with lipomatous hypertrophy.    Neuro/Psych PSYCHIATRIC DISORDERS Anxiety negative neurological ROS     GI/Hepatic Neg liver ROS, IBS, diverticulosis    Endo/Other  diabetes (pre-diabetic ), Well ControlledBMI 31 a1c 6.1  Renal/GU negative Renal ROS  negative genitourinary   Musculoskeletal  (+) Arthritis , Osteoarthritis,    Abdominal   Peds  Hematology Hb 13, plt 200   Anesthesia Other Findings   Reproductive/Obstetrics negative OB ROS                           Anesthesia Physical Anesthesia Plan  ASA: 2  Anesthesia Plan: MAC and Spinal   Post-op Pain Management: Tylenol PO  (pre-op)*   Induction:   PONV Risk Score and Plan: 2 and Propofol infusion and TIVA  Airway Management Planned: Natural Airway and Simple Face Mask  Additional Equipment: None  Intra-op Plan:   Post-operative Plan:   Informed Consent: I have reviewed the patients History and Physical, chart, labs and discussed the procedure including the risks, benefits and alternatives for the proposed anesthesia with the patient or authorized representative who has indicated his/her understanding and acceptance.       Plan Discussed with: CRNA  Anesthesia Plan Comments:         Anesthesia Quick Evaluation

## 2022-05-14 NOTE — Anesthesia Postprocedure Evaluation (Signed)
Anesthesia Post Note  Patient: Connie Marshall  Procedure(s) Performed: RIGHT TOTAL HIP ARTHROPLASTY ANTERIOR APPROACH (Right: Hip)     Patient location during evaluation: PACU Anesthesia Type: Spinal Level of consciousness: oriented and awake and alert Pain management: pain level controlled Vital Signs Assessment: post-procedure vital signs reviewed and stable Respiratory status: spontaneous breathing, respiratory function stable and patient connected to nasal cannula oxygen Cardiovascular status: blood pressure returned to baseline and stable Postop Assessment: no headache, no backache, no apparent nausea or vomiting, spinal receding and patient able to bend at knees Anesthetic complications: no   No notable events documented.  Last Vitals:  Vitals:   05/14/22 1515 05/14/22 1530  BP: (!) 99/55 (!) 109/58  Pulse: (!) 59 61  Resp: 18 19  Temp:    SpO2: 99% 97%    Last Pain:  Vitals:   05/14/22 1515  TempSrc:   PainSc: Asleep                 Anaisabel Pederson,W. EDMOND

## 2022-05-14 NOTE — Progress Notes (Signed)
Pt arrived to unit alert and oriented, nausea with vomit, no c/o pain, family at bedside. Bed in lowest position call bell with in reach

## 2022-05-15 ENCOUNTER — Encounter (HOSPITAL_COMMUNITY): Payer: Self-pay | Admitting: Orthopaedic Surgery

## 2022-05-15 DIAGNOSIS — I10 Essential (primary) hypertension: Secondary | ICD-10-CM | POA: Diagnosis present

## 2022-05-15 DIAGNOSIS — E785 Hyperlipidemia, unspecified: Secondary | ICD-10-CM | POA: Diagnosis present

## 2022-05-15 DIAGNOSIS — Z853 Personal history of malignant neoplasm of breast: Secondary | ICD-10-CM | POA: Diagnosis not present

## 2022-05-15 DIAGNOSIS — K589 Irritable bowel syndrome without diarrhea: Secondary | ICD-10-CM | POA: Diagnosis present

## 2022-05-15 DIAGNOSIS — Z6831 Body mass index (BMI) 31.0-31.9, adult: Secondary | ICD-10-CM | POA: Diagnosis not present

## 2022-05-15 DIAGNOSIS — Z8249 Family history of ischemic heart disease and other diseases of the circulatory system: Secondary | ICD-10-CM | POA: Diagnosis not present

## 2022-05-15 DIAGNOSIS — Z79899 Other long term (current) drug therapy: Secondary | ICD-10-CM | POA: Diagnosis not present

## 2022-05-15 DIAGNOSIS — E119 Type 2 diabetes mellitus without complications: Secondary | ICD-10-CM | POA: Diagnosis present

## 2022-05-15 DIAGNOSIS — Z91048 Other nonmedicinal substance allergy status: Secondary | ICD-10-CM | POA: Diagnosis not present

## 2022-05-15 DIAGNOSIS — Z833 Family history of diabetes mellitus: Secondary | ICD-10-CM | POA: Diagnosis not present

## 2022-05-15 DIAGNOSIS — Z79811 Long term (current) use of aromatase inhibitors: Secondary | ICD-10-CM | POA: Diagnosis not present

## 2022-05-15 DIAGNOSIS — E669 Obesity, unspecified: Secondary | ICD-10-CM | POA: Diagnosis present

## 2022-05-15 DIAGNOSIS — M1611 Unilateral primary osteoarthritis, right hip: Secondary | ICD-10-CM | POA: Diagnosis present

## 2022-05-15 LAB — BASIC METABOLIC PANEL
Anion gap: 6 (ref 5–15)
BUN: 9 mg/dL (ref 8–23)
CO2: 24 mmol/L (ref 22–32)
Calcium: 9.2 mg/dL (ref 8.9–10.3)
Chloride: 109 mmol/L (ref 98–111)
Creatinine, Ser: 0.75 mg/dL (ref 0.44–1.00)
GFR, Estimated: >60 mL/min (ref >60)
Glucose, Bld: 111 mg/dL — ABNORMAL HIGH (ref 70–99)
Potassium: 4 mmol/L (ref 3.5–5.1)
Sodium: 139 mmol/L (ref 135–145)

## 2022-05-15 LAB — CBC
HCT: 30.7 % — ABNORMAL LOW (ref 36.0–46.0)
Hemoglobin: 10.2 g/dL — ABNORMAL LOW (ref 12.0–15.0)
MCH: 29.1 pg (ref 26.0–34.0)
MCHC: 33.2 g/dL (ref 30.0–36.0)
MCV: 87.7 fL (ref 80.0–100.0)
Platelets: 133 10*3/uL — ABNORMAL LOW (ref 150–400)
RBC: 3.5 MIL/uL — ABNORMAL LOW (ref 3.87–5.11)
RDW: 12.6 % (ref 11.5–15.5)
WBC: 4 10*3/uL (ref 4.0–10.5)
nRBC: 0 % (ref 0.0–0.2)

## 2022-05-15 MED ORDER — KETOROLAC TROMETHAMINE 15 MG/ML IJ SOLN
7.5000 mg | Freq: Four times a day (QID) | INTRAMUSCULAR | Status: AC
  Administered 2022-05-15 (×2): 7.5 mg via INTRAVENOUS
  Filled 2022-05-15 (×2): qty 1

## 2022-05-15 MED ORDER — OXYCODONE HCL 5 MG PO TABS
5.0000 mg | ORAL_TABLET | ORAL | Status: DC | PRN
  Administered 2022-05-16: 5 mg via ORAL
  Filled 2022-05-15: qty 1

## 2022-05-15 NOTE — NC FL2 (Signed)
[place] LEVEL OF CARE SCREENING TOOL     IDENTIFICATION  Patient Name: Connie Marshall Birthdate: Jul 23, 1946 Sex: female Admission Date (Current Location): 05/14/2022  [facility] and [place] Number:  Herbalist and Address:  [facility], [place] [address], [place], [place] [address]      Provider Number: [id number]  Attending Physician Name and Address:  Mcarthur Rossetti  Relative Name and Phone Number:       Current Level of Care: Hospital Recommended Level of Care: [place] Prior Approval Number:    Date Approved/Denied:   PASRR Number: [id number]  Discharge Plan: SNF    Current Diagnoses: Patient Active Problem List   Diagnosis Date Noted   Status post total replacement of right hip 05/14/2022   Unilateral primary osteoarthritis, right hip 03/27/2022   Preop exam for internal medicine 01/16/2022   Insect bite, infected 02/19/2021   Shingles outbreak 02/19/2021   Dysuria 05/16/2020   Right knee pain 05/16/2020   Carcinoma of upper-outer quadrant of left breast in female, estrogen receptor positive ([place]) 08/25/2019   Bilateral breast cancer ([place]) 08/17/2019   Skin nodule 05/11/2019   B12 deficiency 05/11/2019   Vitamin D deficiency 05/11/2019   Acute lymphadenitis 07/16/2017   Eustachian tube dysfunction, bilateral 07/16/2017   Prolapse of female bladder, acquired 03/25/2017   Sudden right hearing loss 03/25/2017   Left otitis media 03/25/2017   Rash 11/07/2015   Abnormal mammogram 09/13/2015   Blister of toe of left foot 04/13/2015   Breast discharge 04/13/2015   Encounter for well adult exam with abnormal findings 08/06/2011   Microscopic hematuria 04/27/2010   ANXIETY 03/25/2009   PARESTHESIA 03/23/2009   PERIPHERAL EDEMA 03/23/2009   HEMORRHOIDS, INTERNAL 02/13/2008   DIVERTICULOSIS, COLON 02/13/2008   IBS 02/13/2008   CHOLECYSTECTOMY, LAPAROSCOPIC, HX OF 02/13/2008   OBESITY NOS  07/07/2007   Diabetes ([place]) 07/06/2007   Hyperlipidemia 07/06/2007   Essential hypertension 07/06/2007    Orientation RESPIRATION BLADDER Height & Weight     Self, Time, Situation, Place  Normal Continent Weight: 197 lb (89.4 kg) Height:  '5\' 7"'$  (170.2 cm)  BEHAVIORAL SYMPTOMS/MOOD NEUROLOGICAL BOWEL NUTRITION STATUS      Continent Diet (regular)  AMBULATORY STATUS COMMUNICATION OF NEEDS Skin   Extensive Assist Verbally Surgical wounds (closed right hip, hydrocolloid dressing)                       Personal Care Assistance Level of Assistance  Bathing, Feeding, Dressing Bathing Assistance: Maximum assistance Feeding assistance: Limited assistance Dressing Assistance: Maximum assistance     Functional Limitations Info             SPECIAL CARE FACTORS FREQUENCY  PT (By licensed PT), OT (By licensed OT)     PT Frequency: 5x/wk OT Frequency: 5x/wk            Contractures Contractures Info: Not present    Additional Factors Info  Code Status, Allergies Code Status Info: Full Allergies Info: NKA           Current Medications (05/15/2022):  This is the current hospital active medication list Current Facility-Administered Medications  Medication Dose Route Frequency Provider Last Rate Last Admin   0.9 %  sodium chloride infusion   Intravenous Continuous Mcarthur Rossetti, MD 75 mL/hr at 05/15/22 0852 New Bag at 05/15/22 0852   acetaminophen (TYLENOL) tablet 325-650 mg  325-650 mg Oral Q6H PRN Mcarthur Rossetti,  MD   650 mg at 05/15/22 1155   alum & mag hydroxide-simeth (MAALOX/MYLANTA) 200-200-20 MG/5ML suspension 30 mL  30 mL Oral Q4H PRN Mcarthur Rossetti, MD       anastrozole (ARIMIDEX) tablet 1 mg  1 mg Oral Daily Mcarthur Rossetti, MD   1 mg at 05/15/22 0848   aspirin chewable tablet 81 mg  81 mg Oral BID Mcarthur Rossetti, MD   81 mg at 05/15/22 0848   cholecalciferol (VITAMIN D3) tablet 4,000 Units  4,000 Units Oral Daily  Mcarthur Rossetti, MD   4,000 Units at 05/15/22 0848   diphenhydrAMINE (BENADRYL) 12.5 MG/5ML elixir 12.5-25 mg  12.5-25 mg Oral Q4H PRN Mcarthur Rossetti, MD       docusate sodium (COLACE) capsule 100 mg  100 mg Oral BID Mcarthur Rossetti, MD   100 mg at 05/15/22 0850   furosemide (LASIX) tablet 40 mg  40 mg Oral Daily Mcarthur Rossetti, MD   40 mg at 05/15/22 0850   HYDROcodone-acetaminophen (NORCO) 7.5-325 MG per tablet 1-2 tablet  1-2 tablet Oral Q4H PRN Mcarthur Rossetti, MD   2 tablet at 05/15/22 9798   HYDROcodone-acetaminophen (NORCO/VICODIN) 5-325 MG per tablet 1-2 tablet  1-2 tablet Oral Q4H PRN Mcarthur Rossetti, MD   2 tablet at 05/15/22 1359   ketorolac (TORADOL) 15 MG/ML injection 7.5 mg  7.5 mg Intravenous Q6H Mcarthur Rossetti, MD   7.5 mg at 05/15/22 1300   menthol-cetylpyridinium (CEPACOL) lozenge 3 mg  1 lozenge Oral PRN Mcarthur Rossetti, MD       Or   phenol (CHLORASEPTIC) mouth spray 1 spray  1 spray Mouth/Throat PRN Mcarthur Rossetti, MD       methocarbamol (ROBAXIN) tablet 500 mg  500 mg Oral Q6H PRN Mcarthur Rossetti, MD   500 mg at 05/14/22 2034   Or   methocarbamol (ROBAXIN) 500 mg in dextrose 5 % 50 mL IVPB  500 mg Intravenous Q6H PRN Mcarthur Rossetti, MD       metoCLOPramide (REGLAN) tablet 5-10 mg  5-10 mg Oral Q8H PRN Mcarthur Rossetti, MD       Or   metoCLOPramide (REGLAN) injection 5-10 mg  5-10 mg Intravenous Q8H PRN Mcarthur Rossetti, MD       morphine (PF) 2 MG/ML injection 0.5-1 mg  0.5-1 mg Intravenous Q2H PRN Mcarthur Rossetti, MD   1 mg at 05/15/22 0628   ondansetron (ZOFRAN) tablet 4 mg  4 mg Oral Q6H PRN Mcarthur Rossetti, MD       Or   ondansetron [facility]) injection 4 mg  4 mg Intravenous Q6H PRN Mcarthur Rossetti, MD   4 mg at 05/14/22 1815   oxyCODONE (Oxy IR/ROXICODONE) immediate release tablet 5 mg  5 mg Oral Q4H PRN Mcarthur Rossetti, MD        pantoprazole (PROTONIX) EC tablet 40 mg  40 mg Oral Daily Mcarthur Rossetti, MD   40 mg at 05/15/22 0851   simvastatin (ZOCOR) tablet 20 mg  20 mg Oral QPM Mcarthur Rossetti, MD   20 mg at 05/14/22 2051     Discharge Medications: Please see discharge summary for a list of discharge medications.  Relevant Imaging Results:  Relevant Lab Results:   Additional Information SS#: [id number]  Geralynn Ochs, LCSW

## 2022-05-15 NOTE — Care Management Obs Status (Signed)
[place] NOTIFICATION   Patient Details  Name: Connie Marshall MRN: [id number] Date of Birth: 08-22-1946   Medicare Observation Status Notification Given:  Yes    Geralynn Ochs, LCSW 05/15/2022, 3:03 PM

## 2022-05-15 NOTE — Progress Notes (Signed)
Subjective: 1 Day Post-Op Procedure(s) (LRB): RIGHT TOTAL HIP ARTHROPLASTY ANTERIOR APPROACH (Right) Patient reports pain as moderate.    Objective: Vital signs in last 24 hours: Temp:  [97.2 F (36.2 C)-98.4 F (36.9 C)] 98.2 F (36.8 C) (08/02 0645) Pulse Rate:  [59-96] 88 (08/02 0645) Resp:  [11-19] 16 (08/02 0645) BP: (99-157)/(48-80) 127/48 (08/02 0645) SpO2:  [92 %-100 %] 93 % (08/02 0645) Weight:  [89.4 kg] 89.4 kg (08/01 1048)  Intake/Output from previous day: 08/01 0701 - 08/02 0700 In: 2732 [P.O.:720; I.V.:1712; IV Piggyback:300] Out: 1000 [Urine:700; Blood:300] Intake/Output this shift: No intake/output data recorded.  Recent Labs    05/15/22 0445  HGB 10.2*   Recent Labs    05/15/22 0445  WBC 4.0  RBC 3.50*  HCT 30.7*  PLT 133*   Recent Labs    05/15/22 0445  NA 139  K 4.0  CL 109  CO2 24  BUN 9  CREATININE 0.75  GLUCOSE 111*  CALCIUM 9.2   No results for input(s): "LABPT", "INR" in the last 72 hours.  Sensation intact distally Intact pulses distally Dorsiflexion/Plantar flexion intact Incision: dressing C/D/I   Assessment/Plan: 1 Day Post-Op Procedure(s) (LRB): RIGHT TOTAL HIP ARTHROPLASTY ANTERIOR APPROACH (Right) Up with therapy      Mcarthur Rossetti 05/15/2022, 7:41 AM

## 2022-05-15 NOTE — TOC Initial Note (Signed)
Transition of Care [facility]) - Initial/Assessment Note    Patient Details  Name: Jalexis Breed MRN: [id number] Date of Birth: [birth year]  [facility] CM/SW Contact:    Geralynn Ochs, LCSW Phone Number: 05/15/2022, 3:15 PM  Clinical Narrative:        CSW met with patient to discuss recommendation for SNF. Patient in agreement, asking for a list to look over. CSW faxed out referral, printed out CMS list for patient to review. Patient to review list and discuss with her daughter to determine preferences. CSW to follow.           Expected Discharge Plan: Skilled Nursing Facility Barriers to Discharge: Insurance Authorization, Continued Medical Work up   Patient Goals and CMS Choice Patient states their goals for this hospitalization and ongoing recovery are:: to get better and back home CMS Medicare.gov Compare Post Acute Care list provided to:: Patient Choice offered to / list presented to : Patient  Expected Discharge Plan and Services Expected Discharge Plan: [place] Choice: [place] arrangements for the past 2 months: Single Family Home                                      Prior Living Arrangements/Services Living arrangements for the past 2 months: Single Family Home Lives with:: Self Patient language and need for interpreter reviewed:: No Do you feel safe going back to the place where you live?: Yes      Need for Family Participation in Patient Care: No (Comment) Care giver support system in place?: No (comment)   Criminal Activity/Legal Involvement Pertinent to Current Situation/Hospitalization: No - Comment as needed  Activities of Daily Living Home Assistive Devices/Equipment: Cane (specify quad or straight) ADL Screening (condition at time of admission) Patient's cognitive ability adequate to safely complete daily activities?: Yes Is the patient deaf or have difficulty hearing?:  No Does the patient have difficulty seeing, even when wearing glasses/contacts?: No Does the patient have difficulty concentrating, remembering, or making decisions?: No Patient able to express need for assistance with ADLs?: Yes Does the patient have difficulty dressing or bathing?: No Independently performs ADLs?: Yes (appropriate for developmental age) Does the patient have difficulty walking or climbing stairs?: Yes Weakness of Legs: Right Weakness of Arms/Hands: None  Permission Sought/Granted Permission sought to share information with : Facility Art therapist granted to share information with : Yes, Verbal Permission Granted     Permission granted to share info w AGENCY: SNF        Emotional Assessment Appearance:: Appears stated age Attitude/Demeanor/Rapport: Engaged Affect (typically observed): Appropriate Orientation: : Oriented to Self, Oriented to Place, Oriented to  Time, Oriented to Situation Alcohol / Substance Use: Not Applicable Psych Involvement: No (comment)  Admission diagnosis:  Status post total replacement of right hip [Z96.641] Patient Active Problem List   Diagnosis Date Noted   Status post total replacement of right hip 05/14/2022   Unilateral primary osteoarthritis, right hip 03/27/2022   Preop exam for internal medicine 01/16/2022   Insect bite, infected 02/19/2021   Shingles outbreak 02/19/2021   Dysuria 05/16/2020   Right knee pain 05/16/2020   Carcinoma of upper-outer quadrant of left breast in female, estrogen receptor positive ([place]) 08/25/2019   Bilateral breast cancer ([place]) 08/17/2019   Skin nodule 05/11/2019   B12 deficiency 05/11/2019   Vitamin D  deficiency 05/11/2019   Acute lymphadenitis 07/16/2017   Eustachian tube dysfunction, bilateral 07/16/2017   Prolapse of female bladder, acquired 03/25/2017   Sudden right hearing loss 03/25/2017   Left otitis media 03/25/2017   Rash 11/07/2015   Abnormal mammogram  09/13/2015   Blister of toe of left foot 04/13/2015   Breast discharge 04/13/2015   Encounter for well adult exam with abnormal findings 08/06/2011   Microscopic hematuria 04/27/2010   ANXIETY 03/25/2009   PARESTHESIA 03/23/2009   PERIPHERAL EDEMA 03/23/2009   HEMORRHOIDS, INTERNAL 02/13/2008   DIVERTICULOSIS, COLON 02/13/2008   IBS 02/13/2008   CHOLECYSTECTOMY, LAPAROSCOPIC, HX OF 02/13/2008   OBESITY NOS 07/07/2007   Diabetes ([place]) 07/06/2007   Hyperlipidemia 07/06/2007   Essential hypertension 07/06/2007   PCP:  [name], MD Pharmacy:   [facility] [place], [place] [address] [place] [address] Phone: [phone] Fax: [phone]  [id number] - [place], [place] - [address] [place] [address] Phone: [phone] Fax: [phone]     Social Determinants of Health (SDOH) Interventions    Readmission Risk Interventions     No data to display

## 2022-05-15 NOTE — Progress Notes (Addendum)
Physical Therapy Treatment Patient Details Name: Connie Marshall MRN: [id number] DOB: [birth year] Today's Date: 05/15/2022   History of Present Illness Pt is a 76 y/o female s/p right THA with anterior approach. Pt has PMH of obesity, DM, IBS, HLD, and HTN.    PT Comments    Pt able to perform stand pivot and step pivot transfers respectively with use of RW. Pt limited by pain, weakness, and fatigue. Pt requires continued education on breathing techniques with painful movements. Pt will continue to benefit from skilled, acute care physical therapy interventions to maximize her current level of function and progress towards established goals.    Recommendations for follow up therapy are one component of a multi-disciplinary discharge planning process, led by the attending physician.  Recommendations may be updated based on patient status, additional functional criteria and insurance authorization.  Follow Up Recommendations  Skilled nursing-short term rehab (<3 hours/day) Can patient physically be transported by private vehicle:  (pending progress)   Assistance Recommended at Discharge Frequent or constant Supervision/Assistance  Patient can return home with the following Assistance with cooking/housework;Assist for transportation;Help with stairs or ramp for entrance;A lot of help with walking and/or transfers;A lot of help with bathing/dressing/bathroom   Equipment Recommendations   (Defer to SNF)    Recommendations for Other Services       Precautions / Restrictions Precautions Precautions: Fall Precaution Comments: no hip precautions Restrictions Weight Bearing Restrictions: Yes RLE Weight Bearing: Weight bearing as tolerated     Mobility  Bed Mobility Overal bed mobility: Needs Assistance Bed Mobility: Supine to Sit, Sit to Supine     Supine to sit: Mod assist Sit to supine: Mod assist   General bed mobility comments: Pt utilized bed rail and required more than reasonable  time to complete supine to sit. LE management required for sit to supine.    Transfers Overall transfer level: Needs assistance Equipment used: Rolling walker (2 wheels) Transfers: Sit to/from Stand, Bed to chair/wheelchair/BSC Sit to Stand: Mod assist, From elevated surface Stand pivot transfers: Min assist, From elevated surface Step pivot transfers: Min assist       General transfer comment: Pt performed stand pivot transfer from bed to [facility] and step pivot transfer from [facility] back to bed. Pt required cues for hand placements, safety awareness, and sequencing. Pt requested to get back to bed after BSC. Pt performed static and dynamic standing balance with CGA while wiping herself (one hand on RW).    Ambulation/Gait Ambulation/Gait assistance: Min assist Gait Distance (Feet): 3 Feet Assistive device: Rolling walker (2 wheels) Gait Pattern/deviations: Step-to pattern, Decreased step length - left, Decreased step length - right, Decreased stance time - right, Antalgic, Trunk flexed Gait velocity: decreased Gait velocity interpretation: <1.31 ft/sec, indicative of household ambulator   General Gait Details: Pt ambulated short distance with step pivot transfer back to bed. Pt with poor weight acceptance on R LE.   Stairs             Wheelchair Mobility    Modified Rankin (Stroke Patients Only)       Balance Overall balance assessment: Needs assistance Sitting-balance support: Feet supported, Bilateral upper extremity supported, No upper extremity supported, Single extremity supported Sitting balance-Leahy Scale: Fair Sitting balance - Comments: will assess   Standing balance support: Reliant on assistive device for balance, Bilateral upper extremity supported, Single extremity supported Standing balance-Leahy Scale: Poor  Cognition Arousal/Alertness: Awake/alert Behavior During Therapy: WFL for tasks assessed/performed Overall  Cognitive Status: Within Functional Limits for tasks assessed                                 General Comments: Pt A and O x 4           General Comments General comments (skin integrity, edema, etc.): HR and SpO2 stable on RA      Pertinent Vitals/Pain Pain Assessment Pain Assessment: 0-10 Pain Score: 7  Pain Location: R hip Pain Descriptors / Indicators: Dull Pain Intervention(s): Limited activity within patient's tolerance, Monitored during session, Premedicated before session, Ice applied    Home Living                          Prior Function            PT Goals (current goals can now be found in the care plan section) Acute Rehab PT Goals Patient Stated Goal: none stated PT Goal Formulation: With patient Time For Goal Achievement: 05/28/22 Potential to Achieve Goals: Fair Progress towards PT goals: Progressing toward goals (slow)    Frequency    BID      PT Plan Current plan remains appropriate    Co-evaluation              AM-PAC PT "6 Clicks" Mobility   Outcome Measure  Help needed turning from your back to your side while in a flat bed without using bedrails?: A Lot Help needed moving from lying on your back to sitting on the side of a flat bed without using bedrails?: A Lot Help needed moving to and from a bed to a chair (including a wheelchair)?: A Lot Help needed standing up from a chair using your arms (e.g., wheelchair or bedside chair)?: A Lot Help needed to walk in hospital room?: Total Help needed climbing 3-5 steps with a railing? : Total 6 Click Score: 10    End of Session Equipment Utilized During Treatment: Gait belt Activity Tolerance: Patient limited by pain;Patient limited by fatigue Patient left: in bed;with call bell/phone within reach;with family/visitor present;with SCD's reapplied;with bed alarm set Nurse Communication: Mobility status (pain status) PT Visit Diagnosis: Pain;Muscle weakness  (generalized) (M62.81);Other abnormalities of gait and mobility (R26.89) Pain - Right/Left: Right Pain - part of body: Hip     Time: 1517-6160 PT Time Calculation (min) (ACUTE ONLY): 34 min  Charges:  $Therapeutic Activity: 23-37 mins                     Donna Bernard, PT    [facility] 05/15/2022, 1:47 PM

## 2022-05-15 NOTE — Discharge Instructions (Signed)

## 2022-05-15 NOTE — Progress Notes (Signed)
Foley catheter removed, pt tolerated well.

## 2022-05-15 NOTE — Evaluation (Signed)
Physical Therapy Evaluation Patient Details Name: Connie Marshall MRN: [id number] DOB: 16-Apr-1946 Today's Date: 05/15/2022  History of Present Illness  Pt is a 76 y/o female s/p right THA with anterior approach. Pt has PMH of obesity, DM, IBS, HLD, and HTN.  Clinical Impression  Pt agreeable to physical therapy evaluation/treatment session. Pt severely limited by pain and weakness on evaluation. Pt unable to complete supine to sit transfer due to screaming in pain despite 2 attempts with differernt techniques. Will progress as tolerated with afternoon session. Pt currently presents with functional limitations secondary to impairments listed in PT problem list. Pt to benefit from skilled, acute care physical therapy interventions to maximize her independence level and quality of life. Pt lives alone and does not have assistance available upon discharge. Will likely need SNF unless significant progress made.     Recommendations for follow up therapy are one component of a multi-disciplinary discharge planning process, led by the attending physician.  Recommendations may be updated based on patient status, additional functional criteria and insurance authorization.  Follow Up Recommendations Skilled nursing-short term rehab (<3 hours/day) (depending on pt progress) Can patient physically be transported by private vehicle:  (pending progress)    Assistance Recommended at Discharge Frequent or constant Supervision/Assistance  Patient can return home with the following   (will reassess)    Equipment Recommendations  (will continue to assess)  Recommendations for Other Services       Functional Status Assessment Patient has had a recent decline in their functional status and demonstrates the ability to make significant improvements in function in a reasonable and predictable amount of time.     Precautions / Restrictions Precautions Precautions: Fall Precaution Comments: no hip  precautions Restrictions Weight Bearing Restrictions: Yes RLE Weight Bearing: Weight bearing as tolerated      Mobility  Bed Mobility Overal bed mobility: Needs Assistance Bed Mobility: Supine to Sit     Supine to sit: Mod assist, Max assist     General bed mobility comments: Attempted supine to sit transfer x 2 with different techniques but pt screaming in pain. Further mobility deferred as requested by pt.    Transfers                   General transfer comment: unable    Ambulation/Gait               General Gait Details: unable  Stairs            Wheelchair Mobility    Modified Rankin (Stroke Patients Only)       Balance Overall balance assessment: Needs assistance     Sitting balance - Comments: will assess                                     Pertinent Vitals/Pain Pain Assessment Pain Assessment: 0-10 Pain Score: 8  Pain Location: R hip Pain Descriptors / Indicators: Dull Pain Intervention(s): Limited activity within patient's tolerance, Monitored during session    Home Living Family/patient expects to be discharged to:: Private residence Living Arrangements: Alone Available Help at Discharge: Home health;Available PRN/intermittently Type of Home: House Home Access: Stairs to enter Entrance Stairs-Rails: Right Entrance Stairs-Number of Steps: 4   Home Layout: One level Home Equipment: Grab bars - tub/shower;Grab bars - toilet;Shower Land (2 wheels);Cane - quad;Cane - single point      Prior Function Prior Level of  Function : Independent/Modified Independent;Driving (denies falls in last six months)             Mobility Comments: Pt ambulates with SPC or RW at baseline (walker more inside house and overall). Pt not getting out much. Pt utilized cane and rail for stairs. ADLs Comments: Pt independent with basic ADL's and IADL's. However, over last month pt has not been getting her groceries  (family member taking care of).     Hand Dominance   Dominant Hand: Right    Extremity/Trunk Assessment   Upper Extremity Assessment Upper Extremity Assessment: Generalized weakness    Lower Extremity Assessment Lower Extremity Assessment: Generalized weakness (2-/5 R hip and knee grossly)       Communication   Communication: No difficulties  Cognition Arousal/Alertness: Awake/alert Behavior During Therapy: WFL for tasks assessed/performed Overall Cognitive Status: Within Functional Limits for tasks assessed                                 General Comments: Pt A and O x 4        General Comments General comments (skin integrity, edema, etc.): 96% SpO2 and 96 HR on RA    Exercises Total Joint Exercises Ankle Circles/Pumps: Both, 15 reps, Supine Quad Sets: Both, 10 reps, Supine Gluteal Sets: Both, Supine (3 reps) Short Arc Quad: Right, Supine, 10 reps   Assessment/Plan    PT Assessment Patient needs continued PT services  PT Problem List Decreased strength;Decreased range of motion;Decreased activity tolerance;Decreased balance;Decreased mobility;Decreased knowledge of use of DME;Decreased safety awareness;Obesity;Pain       PT Treatment Interventions DME instruction;Gait training;Stair training;Functional mobility training;Therapeutic activities;Therapeutic exercise;Balance training;Neuromuscular re-education;Patient/family education;Modalities    PT Goals (Current goals can be found in the Care Plan section)  Acute Rehab PT Goals Patient Stated Goal: none stated PT Goal Formulation: With patient Time For Goal Achievement: 05/28/22 Potential to Achieve Goals:  (guarded)    Frequency BID     Co-evaluation               AM-PAC PT "6 Clicks" Mobility  Outcome Measure Help needed turning from your back to your side while in a flat bed without using bedrails?: A Lot Help needed moving from lying on your back to sitting on the side of a  flat bed without using bedrails?: A Lot Help needed moving to and from a bed to a chair (including a wheelchair)?: Total Help needed standing up from a chair using your arms (e.g., wheelchair or bedside chair)?: Total Help needed to walk in hospital room?: Total Help needed climbing 3-5 steps with a railing? : Total 6 Click Score: 8    End of Session Equipment Utilized During Treatment:  (none) Activity Tolerance: Patient limited by pain;Patient limited by fatigue Patient left: in bed;with call bell/phone within reach;with family/visitor present;with SCD's reapplied Nurse Communication: Mobility status (pain status) PT Visit Diagnosis: Pain;Muscle weakness (generalized) (M62.81);Other abnormalities of gait and mobility (R26.89) Pain - Right/Left: Right Pain - part of body: Hip    Time: 7867-6720 PT Time Calculation (min) (ACUTE ONLY): 39 min   Charges:   PT Evaluation $PT Eval Low Complexity: 1 Low PT Treatments $Therapeutic Exercise: 8-22 mins        Donna Bernard, PT   [facility] 05/15/2022, 10:16 AM

## 2022-05-16 MED ORDER — METHOCARBAMOL 500 MG PO TABS: 500.0000 mg | ORAL_TABLET | Freq: Four times a day (QID) | ORAL | 1 refills | Status: DC | PRN

## 2022-05-16 MED ORDER — ASPIRIN 81 MG PO CHEW: 81.0000 mg | CHEWABLE_TABLET | Freq: Two times a day (BID) | ORAL | 0 refills | Status: DC

## 2022-05-16 MED ORDER — HYDROCODONE-ACETAMINOPHEN 5-325 MG PO TABS
1.0000 | ORAL_TABLET | ORAL | 0 refills | Status: DC | PRN
Start: 2022-05-16 — End: 2022-10-23

## 2022-05-16 NOTE — TOC Transition Note (Signed)
Transition of Care [facility]) - CM/SW Discharge Note   Patient Details  Name: Connie Marshall MRN: [id number] Date of Birth: 1946-09-27  [facility] CM/SW Contact:  Amador Cunas, [place] Phone Number: 05/16/2022, 3:31 PM   Clinical Narrative: [name] received for [facility]. Spoke to [name] in admissions who confirmed they are prepared to admit pt to room 305A. Pt aware she will need to bring home medication anastozole to the facility and will have her son pick it up from home. PTAR arranged for transport and RN provided with number for report [phone]. SW signing off at dc.   Wandra Feinstein, MSW, LCSW [phone] (coverage)      Final next level of care: Skilled Nursing Facility Barriers to Discharge: No Barriers Identified   Patient Goals and CMS Choice Patient states their goals for this hospitalization and ongoing recovery are:: to get better and back home CMS Medicare.gov Compare Post Acute Care list provided to:: Patient Choice offered to / list presented to : Patient  Discharge Placement              Patient chooses bed at: [facility] Patient to be transferred to facility by: [place] Name of family member notified: pt notified her son Patient and family notified of of transfer: 05/16/22  Discharge Plan and Services     Post Acute Care Choice: [place]                               Social Determinants of Health (SDOH) Interventions     Readmission Risk Interventions     No data to display

## 2022-05-16 NOTE — Plan of Care (Addendum)
Gave report to RN at [facility]. Pt transported to [facility] and her son was with her upon discharge. IV was removed, no further questions from pt. Pt left with all her belongings.   Problem: Education: Goal: Knowledge of General Education information will improve Description: Including pain rating scale, medication(s)/side effects and non-pharmacologic comfort measures Outcome: Progressing   Problem: Health Behavior/Discharge Planning: Goal: Ability to manage health-related needs will improve Outcome: Progressing   Problem: Activity: Goal: Risk for activity intolerance will decrease Outcome: Progressing   Problem: Nutrition: Goal: Adequate nutrition will be maintained Outcome: Progressing   Problem: Coping: Goal: Level of anxiety will decrease Outcome: Progressing   Problem: Skin Integrity: Goal: Risk for impaired skin integrity will decrease Outcome: Progressing

## 2022-05-16 NOTE — Progress Notes (Signed)
Pt accepted bed from [facility]. [employer] auth request is pending, ref # [id number]. Spoke to [place] at [facility] who confirmed they are prepared to admit pt once auth received.   Wandra Feinstein, MSW, LCSW [phone] (coverage)

## 2022-05-16 NOTE — Progress Notes (Signed)
Subjective: 2 Days Post-Op Procedure(s) (LRB): RIGHT TOTAL HIP ARTHROPLASTY ANTERIOR APPROACH (Right) Patient reports pain as moderate.  Slow progress with PT. Denies chest pain , SOB, dizziness or lightheaded . Tolerating diet.   Objective: Vital signs in last 24 hours: Temp:  [98.4 F (36.9 C)-100.6 F (38.1 C)] 99.6 F (37.6 C) (08/03 0803) Pulse Rate:  [78-99] 90 (08/03 0803) Resp:  [16-17] 16 (08/03 0803) BP: (86-114)/(46-56) 114/49 (08/03 0803) SpO2:  [94 %-97 %] 94 % (08/03 0803)  Intake/Output from previous day: 08/02 0701 - 08/03 0700 In: 770 [P.O.:320; I.V.:450] Out: -  Intake/Output this shift: No intake/output data recorded.  Recent Labs    05/15/22 0445  HGB 10.2*   Recent Labs    05/15/22 0445  WBC 4.0  RBC 3.50*  HCT 30.7*  PLT 133*   Recent Labs    05/15/22 0445  NA 139  K 4.0  CL 109  CO2 24  BUN 9  CREATININE 0.75  GLUCOSE 111*  CALCIUM 9.2   No results for input(s): "LABPT", "INR" in the last 72 hours.  Dorsiflexion/Plantar flexion intact Incision: dressing C/D/I Compartment soft   Assessment/Plan: 2 Days Post-Op Procedure(s) (LRB): RIGHT TOTAL HIP ARTHROPLASTY ANTERIOR APPROACH (Right) Discharge to SNF when bed available  Follow up 2 weeks post in office.       Kally Cadden 05/16/2022, 9:50 AM

## 2022-05-16 NOTE — Progress Notes (Signed)
Physical Therapy Treatment Patient Details Name: Connie Marshall MRN: [id number] DOB: 10-04-1946 Today's Date: 05/16/2022   History of Present Illness Pt is a 76 y/o female s/p right THA with anterior approach. Pt has PMH of obesity, DM, IBS, HLD, and HTN.    PT Comments    Pt with much improved tolerance to session compared to yesterday. Pt able to ambulate 25 feet total over 2 trials with RW. Therapeutic exercises also performed and reviewed. Pt will continue to benefit from skilled, acute care physical therapy interventions to maximize her current level of function and progress towards established goals.    Recommendations for follow up therapy are one component of a multi-disciplinary discharge planning process, led by the attending physician.  Recommendations may be updated based on patient status, additional functional criteria and insurance authorization.  Follow Up Recommendations  Skilled nursing-short term rehab (<3 hours/day) Can patient physically be transported by private vehicle: Yes   Assistance Recommended at Discharge Frequent or constant Supervision/Assistance  Patient can return home with the following Assistance with cooking/housework;Assist for transportation;Help with stairs or ramp for entrance;A lot of help with walking and/or transfers;A lot of help with bathing/dressing/bathroom   Equipment Recommendations   (Defer to SNF)    Recommendations for Other Services       Precautions / Restrictions Precautions Precautions: Fall Precaution Comments: no hip precautions Restrictions Weight Bearing Restrictions: Yes RLE Weight Bearing: Weight bearing as tolerated     Mobility  Bed Mobility Overal bed mobility: Needs Assistance Bed Mobility: Supine to Sit       Sit to supine: Min assist   General bed mobility comments: Pt utilized bed rail and increased time to complete supine to sit 2/2 pain and weakness. R LE management to scoot more towards EOB.     Transfers Overall transfer level: Needs assistance Equipment used: Rolling walker (2 wheels) Transfers: Sit to/from Stand Sit to Stand: Mod assist, From elevated surface, +2 physical assistance           General transfer comment: Cues required for sequencing and hand placements.    Ambulation/Gait Ambulation/Gait assistance: Min guard Gait Distance (Feet): 25 Feet Assistive device: Rolling walker (2 wheels) Gait Pattern/deviations: Step-to pattern, Decreased step length - left, Decreased step length - right, Decreased stance time - right, Antalgic, Trunk flexed Gait velocity: decreased Gait velocity interpretation: <1.31 ft/sec, indicative of household ambulator   General Gait Details: Pt ambulated 10 feet to bathroom and 15 feet in room thereafter with chair follow. Pt required cues for performance of modified three point pattern. No LOB occurred.   Stairs             Wheelchair Mobility    Modified Rankin (Stroke Patients Only)       Balance Overall balance assessment: Needs assistance Sitting-balance support: Feet supported, Bilateral upper extremity supported, No upper extremity supported, Single extremity supported Sitting balance-Leahy Scale: Fair     Standing balance support: Reliant on assistive device for balance, Bilateral upper extremity supported, Single extremity supported Standing balance-Leahy Scale: Poor                              Cognition Arousal/Alertness: Awake/alert Behavior During Therapy: WFL for tasks assessed/performed Overall Cognitive Status: Within Functional Limits for tasks assessed  Exercises Total Joint Exercises Ankle Circles/Pumps: Both, 15 reps, Supine, Seated (1 set seated heel/toe raises, 1 set supine) Gluteal Sets: Other (comment), Supine (pt verbalized and demonstrated understanding for few reps) Short Arc Quad:  (pt verbalized  understanding) Long Arc Quad: Right, 10 reps, Seated    General Comments General comments (skin integrity, edema, etc.): HR and SpO2 stable on RA      Pertinent Vitals/Pain Pain Assessment Pain Assessment: 0-10 Pain Score: 7  Pain Location: R hip Pain Descriptors / Indicators: Dull Pain Intervention(s): Limited activity within patient's tolerance, Monitored during session, Ice applied    Home Living                          Prior Function            PT Goals (current goals can now be found in the care plan section) Acute Rehab PT Goals Patient Stated Goal: none stated PT Goal Formulation: With patient Time For Goal Achievement: 05/28/22 Potential to Achieve Goals: Fair Progress towards PT goals: Progressing toward goals    Frequency    BID      PT Plan Current plan remains appropriate    Co-evaluation              AM-PAC PT "6 Clicks" Mobility   Outcome Measure  Help needed turning from your back to your side while in a flat bed without using bedrails?: A Little Help needed moving from lying on your back to sitting on the side of a flat bed without using bedrails?: A Little Help needed moving to and from a bed to a chair (including a wheelchair)?: A Lot Help needed standing up from a chair using your arms (e.g., wheelchair or bedside chair)?: A Lot Help needed to walk in hospital room?: A Little Help needed climbing 3-5 steps with a railing? : Total 6 Click Score: 14    End of Session Equipment Utilized During Treatment: Gait belt Activity Tolerance: Patient limited by pain;Patient limited by fatigue Patient left: with family/visitor present;in chair;with call bell/phone within reach;with chair alarm set Nurse Communication: Mobility status (pain status) PT Visit Diagnosis: Pain;Muscle weakness (generalized) (M62.81);Other abnormalities of gait and mobility (R26.89) Pain - Right/Left: Right Pain - part of body: Hip     Time:  3299-2426 PT Time Calculation (min) (ACUTE ONLY): 30 min  Charges:  $Gait Training: 8-22 mins $Therapeutic Exercise: 8-22 mins                     Donna Bernard, PT    [facility] 05/16/2022, 11:11 AM

## 2022-05-16 NOTE — Discharge Summary (Signed)
Patient ID: Connie Marshall MRN: [id number] DOB/AGE: [birth year] 76 y.o.  Admit date: 05/14/2022 Discharge date: 05/16/2022  Admission Diagnoses:  Principal Problem:   Unilateral primary osteoarthritis, right hip Active Problems:   Status post total replacement of right hip   Discharge Diagnoses:  Same  Past Medical History:  Diagnosis Date   Axillary mass, left    Cancer (HCC)    Complication of anesthesia    Diverticulosis    Hemorrhoids    Hyperlipidemia    Hypertension    IBS (irritable bowel syndrome)    Obesity    PONV (postoperative nausea and vomiting)    Pre-diabetes    Psoriasis    Shingles 02/19/2021    Surgeries: Procedure(s): RIGHT TOTAL HIP ARTHROPLASTY ANTERIOR APPROACH on 05/14/2022   Consultants:   Discharged Condition: Improved  Hospital Course: Connie Marshall is an 76 y.o. female who was admitted 05/14/2022 for operative treatment ofUnilateral primary osteoarthritis, right hip. Patient has severe unremitting pain that affects sleep, daily activities, and work/hobbies. After pre-op clearance the patient was taken to the operating room on 05/14/2022 and underwent  Procedure(s): RIGHT TOTAL HIP ARTHROPLASTY ANTERIOR APPROACH.    Patient was given perioperative antibiotics:  Anti-infectives (From admission, onward)    Start     Dose/Rate Route Frequency Ordered Stop   05/14/22 2000  ceFAZolin (ANCEF) IVPB 1 g/50 mL premix        1 g 100 mL/hr over 30 Minutes Intravenous Every 6 hours 05/14/22 1754 05/15/22 0255   05/14/22 1146  ceFAZolin (ANCEF) 2-4 GM/100ML-% IVPB       Note to Pharmacy: Demetrios Isaacs A: cabinet override      05/14/22 1146 05/14/22 1306   05/14/22 1145  ceFAZolin (ANCEF) IVPB 2g/100 mL premix        2 g 200 mL/hr over 30 Minutes Intravenous On call to O.R. 05/14/22 1142 05/14/22 1335        Patient was given sequential compression devices, early ambulation, and chemoprophylaxis to prevent DVT.  Patient benefited maximally from hospital  stay and there were no complications.    Recent vital signs: Patient Vitals for the past 24 hrs:  BP Temp Temp src Pulse Resp SpO2  05/16/22 0803 (!) 114/49 99.6 F (37.6 C) Oral 90 16 94 %  05/16/22 0552 (!) 109/54 99.5 F (37.5 C) -- 99 16 96 %  05/15/22 2349 (!) 95/46 98.4 F (36.9 C) -- 85 17 96 %  05/15/22 1926 (!) 86/47 98.4 F (36.9 C) -- 82 17 95 %  05/15/22 1535 (!) 99/52 98.8 F (37.1 C) Oral 78 17 97 %  05/15/22 1137 (!) 105/56 (!) 100.6 F (38.1 C) -- 85 -- 95 %     Recent laboratory studies:  Recent Labs    05/15/22 0445  WBC 4.0  HGB 10.2*  HCT 30.7*  PLT 133*  NA 139  K 4.0  CL 109  CO2 24  BUN 9  CREATININE 0.75  GLUCOSE 111*  CALCIUM 9.2     Discharge Medications:   Allergies as of 05/16/2022       Reactions   Tape    ALLERGIC TO NON-ALLERGIC TAPE-causes a rash   Silicone Rash        Medication List     STOP taking these medications    traMADol 50 MG tablet Commonly known as: ULTRAM       TAKE these medications    acetaminophen 650 MG CR tablet Commonly known as: TYLENOL Take 1,300 mg  by mouth 2 (two) times daily.   anastrozole 1 MG tablet Commonly known as: ARIMIDEX TAKE 1 TABLET(1 MG) BY MOUTH DAILY   aspirin 81 MG chewable tablet Chew 1 tablet (81 mg total) by mouth 2 (two) times daily.   fosinopril 20 MG tablet Commonly known as: MONOPRIL TAKE 1 TABLET(20 MG) BY MOUTH DAILY   furosemide 40 MG tablet Commonly known as: LASIX 1 tab by mouth in the AM, and 1 in the PM as needed for leg swelling   HYDROcodone-acetaminophen 5-325 MG tablet Commonly known as: NORCO/VICODIN Take 1-2 tablets by mouth every 4 (four) hours as needed for moderate pain (pain score 4-6).   methocarbamol 500 MG tablet Commonly known as: ROBAXIN Take 1 tablet (500 mg total) by mouth every 6 (six) hours as needed for muscle spasms.   OVER THE COUNTER MEDICATION Apply 1 Application topically daily. Cortisone cream   simvastatin 20 MG  tablet Commonly known as: ZOCOR Take 1 tablet (20 mg total) by mouth every evening.   SUPER B COMPLEX PO Take 1 capsule by mouth daily.   Turmeric Curcumin Caps Take 1 capsule by mouth daily. Take for immune system   Vitamin D3 Super Strength 50 MCG (2000 UT) Tabs Generic drug: Cholecalciferol Take 4,000 Units by mouth daily.               Durable Medical Equipment  (From admission, onward)           Start     Ordered   05/14/22 1755  DME 3 n 1  Once        05/14/22 1754   05/14/22 1755  DME Walker rolling  Once       Question Answer Comment  Walker: With 5 Inch Wheels   Patient needs a walker to treat with the following condition Status post total replacement of right hip      05/14/22 1754            Diagnostic Studies: DG Pelvis Portable  Result Date: 05/14/2022 CLINICAL DATA:  Postop EXAM: PORTABLE PELVIS 1-2 VIEWS COMPARISON:  None Available. FINDINGS: RIGHT total hip arthroplasty. Femoral component and acetabular component appear well seated. Expected soft tissue change. No fracture dislocation IMPRESSION: No complication following total hip arthroplasty. Electronically Signed   By: Suzy Bouchard M.D.   On: 05/14/2022 15:23   DG HIP UNILAT WITH PELVIS 1V RIGHT  Result Date: 05/14/2022 CLINICAL DATA:  Right hip arthroplasty anterior approach EXAM: DG HIP (WITH OR WITHOUT PELVIS) 1V RIGHT COMPARISON:  Pelvis 03/27/2022 FINDINGS: Right hip replacement in satisfactory position and alignment. No fracture or complication. IMPRESSION: Satisfactory right hip replacement. Electronically Signed   By: Franchot Gallo M.D.   On: 05/14/2022 14:19   DG C-Arm 1-60 Min-No Report  Result Date: 05/14/2022 Fluoroscopy was utilized by the requesting physician.  No radiographic interpretation.    Disposition: Discharge disposition: 03-Skilled [place]     Mcarthur Rossetti, MD Follow up in 2 week(s).   Specialty:  Orthopedic Surgery Contact information: [address] [place] [address] [phone]                  Signed: Erskine Emery 05/16/2022, 10:20 AM

## 2022-05-16 NOTE — Progress Notes (Addendum)
Physical Therapy Treatment Patient Details Name: Connie Marshall MRN: [id number] DOB: [birth year] Today's Date: 05/16/2022   History of Present Illness Pt is a 76 y/o female s/p right THA with anterior approach. Pt has PMH of obesity, DM, IBS, HLD, and HTN.    PT Comments    Pt agreeable to treatment. Pt's son present to assist with chair follow during gait. Session focused on maximizing pt's gait capacity and sit to stand abilities from lower surface. Pt with more difficulty standing from bedside chair compared to elevated bed but able to increase gait distance again. Pt will continue to benefit from skilled, acute care physical therapy interventions to maximize her current level of function and progress towards established goals.   Recommendations for follow up therapy are one component of a multi-disciplinary discharge planning process, led by the attending physician.  Recommendations may be updated based on patient status, additional functional criteria and insurance authorization.  Follow Up Recommendations  Skilled nursing-short term rehab (<3 hours/day) Can patient physically be transported by private vehicle: Yes   Assistance Recommended at Discharge Frequent or constant Supervision/Assistance  Patient can return home with the following Assistance with cooking/housework;Assist for transportation;Help with stairs or ramp for entrance;A lot of help with walking and/or transfers;A lot of help with bathing/dressing/bathroom   Equipment Recommendations   (Defer to SNF)    Recommendations for Other Services       Precautions / Restrictions Precautions Precautions: Fall Precaution Comments: no hip precautions Restrictions Weight Bearing Restrictions: Yes RLE Weight Bearing: Weight bearing as tolerated     Mobility  Bed Mobility Overal bed mobility: Needs Assistance Bed Mobility: Sit to Supine       Sit to supine: Max assist   General bed mobility comments: Pt in chair upon  arrival. Max assist required for LE management on return to bed; pt also utilized bed rail.    Transfers Overall transfer level: Needs assistance Equipment used: Rolling walker (2 wheels) Transfers: Sit to/from Stand Sit to Stand: Mod assist, +2 safety/equipment Stand pivot transfers: Min guard         General transfer comment: Cues required for sequencing including utilizing rocking strategy and hand placements. Pt performed three sit <> stands during this encounter (two from chair and one from toilet with bedside commode over it). Pt with decreased muscle power production and took increased time to reach full standing position with some unsteadiness present; pt required cues to increase amount of knee extension as she stood initially with flexed hips, knees, and trunk. Pt also performed stand pivot transfer from chair back to bed after gait training complete.    Ambulation/Gait Ambulation/Gait assistance: Min guard Gait Distance (Feet): 40 Feet Assistive device: Rolling walker (2 wheels) Gait Pattern/deviations: Step-to pattern, Decreased step length - left, Decreased step length - right, Decreased stance time - right, Antalgic, Trunk flexed Gait velocity: decreased Gait velocity interpretation: <1.31 ft/sec, indicative of household ambulator   General Gait Details: Pt ambulated 10 feet to bathroom and 30 feet thereafter with chair follow out into hallway. Pt required cues for performance of modified three point pattern. Pt with weakness and fatigue. No LOB occurred.   Stairs             Wheelchair Mobility    Modified Rankin (Stroke Patients Only)       Balance Overall balance assessment: Needs assistance Sitting-balance support: Feet supported, Bilateral upper extremity supported, No upper extremity supported, Single extremity supported Sitting balance-Leahy Scale: Fair  Standing balance support: Reliant on assistive device for balance, Bilateral upper extremity  supported, Single extremity supported Standing balance-Leahy Scale: Poor                              Cognition Arousal/Alertness: Awake/alert Behavior During Therapy: WFL for tasks assessed/performed Overall Cognitive Status: Within Functional Limits for tasks assessed                                             General Comments General comments (skin integrity, edema, etc.): HR and SpO2 stable on RA      Pertinent Vitals/Pain Pain Assessment Pain Assessment: 0-10 Pain Score: 9  Pain Location: R hip Pain Descriptors / Indicators: Dull Pain Intervention(s): Limited activity within patient's tolerance, Monitored during session, Patient requesting pain meds-RN notified    Home Living                          Prior Function            PT Goals (current goals can now be found in the care plan section) Acute Rehab PT Goals Patient Stated Goal: none stated PT Goal Formulation: With patient Time For Goal Achievement: 05/28/22 Potential to Achieve Goals:  (fairly good) Progress towards PT goals: Progressing toward goals    Frequency    BID      PT Plan Current plan remains appropriate    Co-evaluation              AM-PAC PT "6 Clicks" Mobility   Outcome Measure  Help needed turning from your back to your side while in a flat bed without using bedrails?: A Little Help needed moving from lying on your back to sitting on the side of a flat bed without using bedrails?: A Little Help needed moving to and from a bed to a chair (including a wheelchair)?: A Lot Help needed standing up from a chair using your arms (e.g., wheelchair or bedside chair)?: A Lot Help needed to walk in hospital room?: A Little Help needed climbing 3-5 steps with a railing? : Total 6 Click Score: 14    End of Session Equipment Utilized During Treatment: Gait belt Activity Tolerance: Patient limited by pain;Patient limited by fatigue Patient left:  with family/visitor present;in bed;with bed alarm set;with call bell/phone within reach Nurse Communication: Mobility status (pain status) PT Visit Diagnosis: Pain;Muscle weakness (generalized) (M62.81);Other abnormalities of gait and mobility (R26.89) Pain - Right/Left: Right Pain - part of body: Hip     Time: 3646-8032 PT Time Calculation (min) (ACUTE ONLY): 28 min  Charges:  $Gait Training: 8-22 mins $Therapeutic Activity: 8-22 mins                    Donna Bernard, PT    [facility] 05/16/2022, 3:33 PM

## 2022-05-29 ENCOUNTER — Telehealth: Payer: Self-pay | Admitting: Orthopaedic Surgery

## 2022-05-29 ENCOUNTER — Ambulatory Visit (INDEPENDENT_AMBULATORY_CARE_PROVIDER_SITE_OTHER): Payer: Medicare Other | Admitting: Orthopaedic Surgery

## 2022-05-29 ENCOUNTER — Encounter: Payer: Self-pay | Admitting: Orthopaedic Surgery

## 2022-05-29 DIAGNOSIS — Z96641 Presence of right artificial hip joint: Secondary | ICD-10-CM

## 2022-05-29 NOTE — Progress Notes (Signed)
The patient is 2 weeks status post a right total hip arthroplasty.  She is now out of the skilled nursing facility at home.  She reports good and improved range of motion and strength.  Her kids are with her today who are very supportive and involved with her care.  Her right hip moves smoothly and fluidly.  The incision looks good.  The staples are removed and Steri-Strips applied.  There is a mild seroma.  She has been compliant with a baby aspirin twice a day.  She was not on this prior to surgery.  I will have her stop her baby aspirin.  She will continue to increase her activities as comfort allows but does not need did perform hip exercises.  She does get back to her activities of daily living as comfort allows.  We will see her back in 4 weeks to make sure she is doing well.  All question concerns were answered and addressed.

## 2022-05-29 NOTE — Telephone Encounter (Signed)
Patient request Rx for a lift chair  Please fax to [phone] if possible. If not please call patient to pick up.

## 2022-05-29 NOTE — Telephone Encounter (Signed)
Faxed to provided number Son aware

## 2022-05-30 ENCOUNTER — Ambulatory Visit: Payer: Medicare Other | Admitting: Internal Medicine

## 2022-06-04 ENCOUNTER — Telehealth: Payer: Self-pay | Admitting: Internal Medicine

## 2022-06-04 NOTE — Telephone Encounter (Signed)
Please advise and I will return their call.

## 2022-06-04 NOTE — Telephone Encounter (Signed)
[place] for UA and culture - dysuria

## 2022-06-04 NOTE — Telephone Encounter (Signed)
Martin Majestic from [facility] stated that Connie Marshall has been complaining of painful urination. Martin Majestic would like to know if Dr. Jenny Reichmann would be ok giving a verbal order for a urinalysis.   Martin Majestic can be reached at [phone]. She said it is ok to leave a message.   Please advise

## 2022-06-06 ENCOUNTER — Telehealth: Payer: Self-pay

## 2022-06-06 NOTE — Telephone Encounter (Signed)
Ok for verbal 

## 2022-06-06 NOTE — Telephone Encounter (Signed)
Colletta Maryland is an OT with [facility] is calling to request orders for OT 1x a week for 6 weeks.   Please call Colletta Maryland at [phone] with verbal orders, Ok to lvm.

## 2022-06-07 ENCOUNTER — Ambulatory Visit (INDEPENDENT_AMBULATORY_CARE_PROVIDER_SITE_OTHER): Payer: Medicare Other

## 2022-06-07 VITALS — Ht 67.0 in | Wt 197.0 lb

## 2022-06-07 DIAGNOSIS — Z0000 Encounter for general adult medical examination without abnormal findings: Secondary | ICD-10-CM

## 2022-06-07 NOTE — Patient Instructions (Signed)
Ms. Connie Marshall , Thank you for taking time to come for your Medicare Wellness Visit. I appreciate your ongoing commitment to your health goals. Please review the following plan we discussed and let me know if I can assist you in the future.   Screening recommendations/referrals: Colonoscopy: no longer required  Mammogram: no longer required  Bone Density: 11/09/2020 Recommended yearly ophthalmology/optometry visit for glaucoma screening and checkup Recommended yearly dental visit for hygiene and checkup  Vaccinations: Influenza vaccine: completed Pneumococcal vaccine: completed  Tdap vaccine: 09/29/2017  per patient this was updated [facility] no records in patient chart  Shingles vaccine: Per patient completed this coarse at [facility] No records in chart  Covid-19:completed   Advanced directives: yes   Conditions/risks identified: Obtain Vaccine record from [place] to report to PCP   Next appointment: Follow up in one year for your annual wellness visit    Preventive Care 27 Years and Older, Female Preventive care refers to lifestyle choices and visits with your health care provider that can promote health and wellness. What does preventive care include? A yearly physical exam. This is also called an annual well check. Dental exams once or twice a year. Routine eye exams. Ask your health care provider how often you should have your eyes checked. Personal lifestyle choices, including: Daily care of your teeth and gums. Regular physical activity. Eating a healthy diet. Avoiding tobacco and drug use. Limiting alcohol use. Practicing safe sex. Taking low-dose aspirin every day. Taking vitamin and mineral supplements as recommended by your health care provider. What happens during an annual well check? The services and screenings done by your health care provider during your annual well check will depend on your age, overall health, lifestyle risk factors, and family history of  disease. Counseling  Your health care provider may ask you questions about your: Alcohol use. Tobacco use. Drug use. Emotional well-being. Home and relationship well-being. Sexual activity. Eating habits. History of falls. Memory and ability to understand (cognition). Work and work Statistician. Reproductive health. Screening  You may have the following tests or measurements: Height, weight, and BMI. Blood pressure. Lipid and cholesterol levels. These may be checked every 5 years, or more frequently if you are over 93 years old. Skin check. Lung cancer screening. You may have this screening every year starting at age 109 if you have a 30-pack-year history of smoking and currently smoke or have quit within the past 15 years. Fecal occult blood test (FOBT) of the stool. You may have this test every year starting at age 34. Flexible sigmoidoscopy or colonoscopy. You may have a sigmoidoscopy every 5 years or a colonoscopy every 10 years starting at age 38. Hepatitis C blood test. Hepatitis B blood test. Sexually transmitted disease (STD) testing. Diabetes screening. This is done by checking your blood sugar (glucose) after you have not eaten for a while (fasting). You may have this done every 1-3 years. Bone density scan. This is done to screen for osteoporosis. You may have this done starting at age 24. Mammogram. This may be done every 1-2 years. Talk to your health care provider about how often you should have regular mammograms. Talk with your health care provider about your test results, treatment options, and if necessary, the need for more tests. Vaccines  Your health care provider may recommend certain vaccines, such as: Influenza vaccine. This is recommended every year. Tetanus, diphtheria, and acellular pertussis (Tdap, Td) vaccine. You may need a Td booster every 10 years. Zoster vaccine. You may  need this after age 20. Pneumococcal 13-valent conjugate (PCV13) vaccine. One  dose is recommended after age 85. Pneumococcal polysaccharide (PPSV23) vaccine. One dose is recommended after age 62. Talk to your health care provider about which screenings and vaccines you need and how often you need them. This information is not intended to replace advice given to you by your health care provider. Make sure you discuss any questions you have with your health care provider. Document Released: 10/27/2015 Document Revised: 06/19/2016 Document Reviewed: 08/01/2015 Elsevier Interactive Patient Education  2017 [place] Prevention in the Home Falls can cause injuries. They can happen to people of all ages. There are many things you can do to make your home safe and to help prevent falls. What can I do on the outside of my home? Regularly fix the edges of walkways and driveways and fix any cracks. Remove anything that might make you trip as you walk through a door, such as a raised step or threshold. Trim any bushes or trees on the path to your home. Use bright outdoor lighting. Clear any walking paths of anything that might make someone trip, such as rocks or tools. Regularly check to see if handrails are loose or broken. Make sure that both sides of any steps have handrails. Any raised decks and porches should have guardrails on the edges. Have any leaves, snow, or ice cleared regularly. Use sand or salt on walking paths during winter. Clean up any spills in your garage right away. This includes oil or grease spills. What can I do in the bathroom? Use night lights. Install grab bars by the toilet and in the tub and shower. Do not use towel bars as grab bars. Use non-skid mats or decals in the tub or shower. If you need to sit down in the shower, use a plastic, non-slip stool. Keep the floor dry. Clean up any water that spills on the floor as soon as it happens. Remove soap buildup in the tub or shower regularly. Attach bath mats securely with double-sided  non-slip rug tape. Do not have throw rugs and other things on the floor that can make you trip. What can I do in the bedroom? Use night lights. Make sure that you have a light by your bed that is easy to reach. Do not use any sheets or blankets that are too big for your bed. They should not hang down onto the floor. Have a firm chair that has side arms. You can use this for support while you get dressed. Do not have throw rugs and other things on the floor that can make you trip. What can I do in the kitchen? Clean up any spills right away. Avoid walking on wet floors. Keep items that you use a lot in easy-to-reach places. If you need to reach something above you, use a strong step stool that has a grab bar. Keep electrical cords out of the way. Do not use floor polish or wax that makes floors slippery. If you must use wax, use non-skid floor wax. Do not have throw rugs and other things on the floor that can make you trip. What can I do with my stairs? Do not leave any items on the stairs. Make sure that there are handrails on both sides of the stairs and use them. Fix handrails that are broken or loose. Make sure that handrails are as long as the stairways. Check any carpeting to make sure that it is firmly attached  to the stairs. Fix any carpet that is loose or worn. Avoid having throw rugs at the top or bottom of the stairs. If you do have throw rugs, attach them to the floor with carpet tape. Make sure that you have a light switch at the top of the stairs and the bottom of the stairs. If you do not have them, ask someone to add them for you. What else can I do to help prevent falls? Wear shoes that: Do not have high heels. Have rubber bottoms. Are comfortable and fit you well. Are closed at the toe. Do not wear sandals. If you use a stepladder: Make sure that it is fully opened. Do not climb a closed stepladder. Make sure that both sides of the stepladder are locked into place. Ask  someone to hold it for you, if possible. Clearly mark and make sure that you can see: Any grab bars or handrails. First and last steps. Where the edge of each step is. Use tools that help you move around (mobility aids) if they are needed. These include: Canes. Walkers. Scooters. Crutches. Turn on the lights when you go into a dark area. Replace any light bulbs as soon as they burn out. Set up your furniture so you have a clear path. Avoid moving your furniture around. If any of your floors are uneven, fix them. If there are any pets around you, be aware of where they are. Review your medicines with your doctor. Some medicines can make you feel dizzy. This can increase your chance of falling. Ask your doctor what other things that you can do to help prevent falls. This information is not intended to replace advice given to you by your health care provider. Make sure you discuss any questions you have with your health care provider. Document Released: 07/27/2009 Document Revised: 03/07/2016 Document Reviewed: 11/04/2014 Elsevier Interactive Patient Education  2017 [employer].

## 2022-06-07 NOTE — Progress Notes (Signed)
Subjective:   Connie Marshall is a 76 y.o. female who presents for Medicare Annual (Subsequent) preventive examination.   Virtual Visit via Telephone Note  I connected with  [name] on 06/07/22 at 11:30 AM EDT by telephone and verified that I am speaking with the correct person using two identifiers.  Location: Patient: home  Provider: [facility]  Persons participating in the virtual visit: patient/Nurse Health Advisor   I discussed the limitations, risks, security and privacy concerns of performing an evaluation and management service by telephone and the availability of in person appointments. The patient expressed understanding and agreed to proceed.  Interactive audio and video telecommunications were attempted between this nurse and patient, however failed, due to patient having technical difficulties OR patient did not have access to video capability.  We continued and completed visit with audio only.  Some vital signs may be absent or patient reported.   [name], LPN  Review of Systems           Objective:    Today's Vitals   06/07/22 1116  Weight: 197 lb (89.4 kg)  Height: '5\' 7"'$  (1.702 m)   Body mass index is 30.85 kg/m.     06/07/2022   11:25 AM 05/14/2022    6:43 PM 05/08/2022    9:33 AM 01/08/2021    2:14 PM 10/27/2019    1:37 PM 10/05/2019    9:11 AM 09/22/2019    1:46 PM  Advanced Directives  Does Patient Have a Medical Advance Directive? Yes No Yes Yes Yes Yes Yes  Type of Paramedic of [place];Living will  Living will Living will Living will Living will Living will  Does patient want to make changes to medical advance directive?  No - Patient declined No - Patient declined No - Patient declined  No - Patient declined No - Patient declined  Copy of [place] in Chart? No - copy requested        Would patient like information on creating a medical advance directive?  No - Patient declined          Current Medications (verified) Outpatient Encounter Medications as of 06/07/2022  Medication Sig   acetaminophen (TYLENOL) 650 MG CR tablet Take 1,300 mg by mouth 2 (two) times daily.   anastrozole (ARIMIDEX) 1 MG tablet TAKE 1 TABLET(1 MG) BY MOUTH DAILY   B Complex-C (SUPER B COMPLEX PO) Take 1 capsule by mouth daily.   Cholecalciferol (VITAMIN D3 SUPER STRENGTH) 50 MCG (2000 UT) TABS Take 4,000 Units by mouth daily.   fosinopril (MONOPRIL) 20 MG tablet TAKE 1 TABLET(20 MG) BY MOUTH DAILY   furosemide (LASIX) 40 MG tablet 1 tab by mouth in the AM, and 1 in the PM as needed for leg swelling   HYDROcodone-acetaminophen (NORCO/VICODIN) 5-325 MG tablet Take 1-2 tablets by mouth every 4 (four) hours as needed for moderate pain (pain score 4-6).   methocarbamol (ROBAXIN) 500 MG tablet Take 1 tablet (500 mg total) by mouth every 6 (six) hours as needed for muscle spasms.   Misc Natural Products (TURMERIC CURCUMIN) CAPS Take 1 capsule by mouth daily. Take for immune system   OVER THE COUNTER MEDICATION Apply 1 Application topically daily. Cortisone cream   Polyethylene Glycol 3350 4 g PACK Take by mouth.   simvastatin (ZOCOR) 20 MG tablet Take 1 tablet (20 mg total) by mouth every evening.   aspirin 81 MG chewable tablet Chew 1 tablet (81 mg total)  by mouth 2 (two) times daily. (Patient not taking: Reported on 06/07/2022)   No facility-administered encounter medications on file as of 06/07/2022.    Allergies (verified) Tape and Silicone   History: Past Medical History:  Diagnosis Date   Axillary mass, left    Cancer (HCC)    Complication of anesthesia    Diverticulosis    Hemorrhoids    Hyperlipidemia    Hypertension    IBS (irritable bowel syndrome)    Obesity    PONV (postoperative nausea and vomiting)    Pre-diabetes    Psoriasis    Shingles 02/19/2021   Past Surgical History:  Procedure Laterality Date   BREAST LUMPECTOMY WITH RADIOACTIVE SEED AND SENTINEL LYMPH NODE BIOPSY  Bilateral 10/05/2019   Procedure: BILATERAL BREAST LUMPECTOMY WITH BILATERAL RADIOACTIVE SEEDS AND LEFT SENTINEL LYMPH NODE BIOPSY;  Surgeon: Coralie Keens, MD;  Location: [place];  Service: General;  Laterality: Bilateral;   BREAST SURGERY     CESAREAN SECTION     2 times   CHOLECYSTECTOMY     laproscopic   CHOLECYSTECTOMY, LAPAROSCOPIC     kidney stone removed  10/14/2009   MASS EXCISION Left 07/12/2019   Procedure: EXCISION LEFT AXILLARY MASS;  Surgeon: Coralie Keens, MD;  Location: [place];  Service: General;  Laterality: Left;   RE-EXCISION OF BREAST LUMPECTOMY Left 10/05/2019   Procedure: RE-EXCISION OF LEFT BREAST CANCER;  Surgeon: Coralie Keens, MD;  Location: [place];  Service: General;  Laterality: Left;   THYROGLOSSAL DUCT CYST  10/14/1998   TONSILLECTOMY  10/14/1977   TOTAL HIP ARTHROPLASTY Right 05/14/2022   Procedure: RIGHT TOTAL HIP ARTHROPLASTY ANTERIOR APPROACH;  Surgeon: Mcarthur Rossetti, MD;  Location: [place];  Service: Orthopedics;  Laterality: Right;   umbilical herniorrhapy     VAGINAL HYSTERECTOMY  10/14/2001   Family History  Problem Relation Age of Onset   Hypertension Mother    Diabetes Father    Hypertension Father    Parkinson's disease Sister    Prostate cancer Brother    Social History   Socioeconomic History   Marital status: Widowed    Spouse name: Not on file   Number of children: 2   Years of education: college   Highest education level: Not on file  Occupational History   Occupation: Retired  Tobacco Use   Smoking status: Never   Smokeless tobacco: Never  Vaping Use   Vaping Use: Never used  Substance and Sexual Activity   Alcohol use: No   Drug use: No   Sexual activity: Not Currently  Other Topics Concern   Not on file  Social History Narrative   HSG, [employer] for 2 years. Married, '30-2 years- widowed; '76- 17 years/ widowed '93. 1 son- '77, 1 daughter-  '78, 4 grandchildren. 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 Lives alone - I- ADLS.   Social Determinants of Health   Financial Resource Strain: Low Risk  (06/07/2022)   Overall Financial Resource Strain (CARDIA)    Difficulty of Paying Living Expenses: Not hard at all  Food Insecurity: No Food Insecurity (06/07/2022)   Hunger Vital Sign    Worried About Running Out of Food in the Last Year: Never true    Ran Out of Food in the Last Year: Never true  Transportation Needs: No Transportation Needs (06/07/2022)   PRAPARE - Hydrologist (Medical): No    Lack of Transportation (Non-Medical): No  Physical Activity: Insufficiently Active (06/07/2022)   Exercise Vital Sign    Days of Exercise per Week: 2 days    Minutes of Exercise per Session: 60 min  Stress: No Stress Concern Present (06/07/2022)   [place]    Feeling of Stress : Only a little  Social Connections: Socially Isolated (06/07/2022)   Social Connection and Isolation Panel [NHANES]    Frequency of Communication with Friends and Family: More than three times a week    Frequency of Social Gatherings with Friends and Family: More than three times a week    Attends Religious Services: Never    Marine scientist or Organizations: No    Attends Archivist Meetings: Never    Marital Status: Widowed    Tobacco Counseling Counseling given: Not Answered   Clinical Intake:  Pre-visit preparation completed: Yes  Pain : No/denies pain     Diabetes: No  How often do you need to have someone help you when you read instructions, pamphlets, or other written materials from your doctor or pharmacy?: 1 - Never What is the last grade level you completed in school?: college  Diabetic?no   Interpreter Needed?: No  Information entered by :: L.Wilson,LPN   Activities of Daily Living    05/14/2022    6:43 PM  05/08/2022    9:38 AM  In your present state of health, do you have any difficulty performing the following activities:  Hearing? 0   Vision? 0   Difficulty concentrating or making decisions? 0   Walking or climbing stairs? 1   Dressing or bathing? 0   Doing errands, shopping? 0 0    Patient Care Team: [name], MD as PCP - General (Internal Medicine) Kathie Rhodes, MD (Inactive) (Urology) Thornell Sartorius, MD (Otolaryngology) Mauro Kaufmann, RN as Oncology Nurse Navigator Rockwell Germany, RN as Oncology Nurse Navigator Rozetta Nunnery, MD (Inactive) as Consulting Physician (Otolaryngology) [place], Heather, [place] as Consulting Physician (Optometry)  Indicate any recent Medical Services you may have received from other than [facility] providers in the past year (date may be approximate).     Assessment:   This is a routine wellness examination for [place].  Hearing/Vision screen Vision Screening - Comments:: Due this year delayed due to health issues   Dietary issues and exercise activities discussed:     Goals Addressed             This Visit's Progress    Patient Stated   On track    Maintain current health status.        Depression Screen    06/07/2022   11:24 AM 06/07/2022   11:22 AM 01/16/2022   11:13 AM 01/16/2022   10:42 AM 10/31/2021   10:33 AM 10/03/2021   10:51 AM 01/08/2021    2:16 PM  PHQ 2/9 Scores  PHQ - 2 Score 0 0 0 0 0 1 0    Fall Risk    06/07/2022   11:18 AM 01/16/2022   11:13 AM 01/16/2022   10:42 AM 01/08/2021    2:15 PM 11/23/2019    1:33 PM  Fall Risk   Falls in the past year? 0 0 0 0 0  Number falls in past yr: 0 0 0 0   Injury with Fall? 0 0 0 0   Risk for fall due to : No Fall Risks   No Fall Risks   Follow up  Falls prevention discussed   Falls evaluation completed     FALL RISK PREVENTION PERTAINING TO THE HOME:  Any stairs in or around the home? No  If so, are there any without handrails? No  Home free of loose throw rugs in  walkways, pet beds, electrical cords, etc? Yes  Adequate lighting in your home to reduce risk of falls? Yes   ASSISTIVE DEVICES UTILIZED TO PREVENT FALLS:  Life alert? No  Use of a cane, walker or w/c? No  Grab bars in the bathroom? Yes  Shower chair or bench in shower? Yes  Elevated toilet seat or a handicapped toilet? Yes          06/07/2022   11:25 AM  6CIT Screen  What Year? 0 points  What month? 0 points  What time? 0 points  Count back from 20 0 points  Months in reverse 0 points  Repeat phrase 0 points  Total Score 0 points    Immunizations Immunization History  Administered Date(s) Administered   Fluad Quad(high Dose 65+) 07/05/2019, 08/12/2020   Influenza Split 08/05/2011, 08/20/2012   Influenza Whole 07/18/2009   Influenza, High Dose Seasonal PF 09/06/2013, 09/13/2015, 08/12/2016, 07/16/2017, 07/21/2018   Influenza,inj,Quad PF,6+ Mos 09/07/2014   Influenza-Unspecified 07/24/2021   PFIZER(Purple Top)SARS-COV-2 Vaccination 11/18/2019, 12/09/2019, 09/29/2020   Pfizer Covid-19 Vaccine Bivalent Booster 69yr & up 07/10/2021   Pneumococcal Conjugate-13 09/27/2013   Pneumococcal Polysaccharide-23 08/05/2011   Td 10/15/2006   Tdap 09/29/2017   Zoster, Live 08/02/2010    TDAP status: Up to date  Flu Vaccine status: Up to date  Pneumococcal vaccine status: Up to date  Covid-19 vaccine status: Completed vaccines  Qualifies for Shingles Vaccine? Yes   Zostavax completed Yes   Shingrix Completed?: Yes  Screening Tests Health Maintenance  Topic Date Due   Zoster Vaccines- Shingrix (1 of 2) Never done   OPHTHALMOLOGY EXAM  05/31/2021   COVID-19 Vaccine (5 - Pfizer risk series) 09/04/2021   INFLUENZA VACCINE  05/14/2022   HEMOGLOBIN A1C  07/18/2022   Diabetic kidney evaluation - Urine ACR  01/17/2023   FOOT EXAM  01/17/2023   Diabetic kidney evaluation - GFR measurement  05/16/2023   TETANUS/TDAP  09/30/2027   Pneumonia Vaccine 76 Years old  Completed    DEXA SCAN  Completed   Hepatitis C Screening  Completed   HPV VACCINES  Aged Out   COLONOSCOPY (Pts 45-462yrInsurance coverage will need to be confirmed)  Discontinued    Health Maintenance  Health Maintenance Due  Topic Date Due   Zoster Vaccines- Shingrix (1 of 2) Never done   OPHTHALMOLOGY EXAM  05/31/2021   COVID-19 Vaccine (5 - Pfizer risk series) 09/04/2021   INFLUENZA VACCINE  05/14/2022    Colorectal cancer screening: No longer required.   Mammogram status: No longer required due to age.  Bone Density status: Completed 11/09/2020. Results reflect: Bone density results: OSTEOPENIA. Repeat every 5 years.  Lung Cancer Screening: (Low Dose CT Chest recommended if Age 76-80ears, 30 pack-year currently smoking OR have quit w/in 15years.) does not qualify.   Lung Cancer Screening Referral: n/a  Additional Screening:  Hepatitis C Screening: does not qualify; Completed 09/05/2015  Vision Screening: Recommended annual ophthalmology exams for early detection of glaucoma and other disorders of the eye. Is the patient up to date with their annual eye exam?  No  Who is the provider or what is the name of the office in which the patient attends annual eye exams?  Declined due current health  If pt is not established with a provider, would they like to be referred to a provider to establish care? No .   Dental Screening: Recommended annual dental exams for proper oral hygiene  Community Resource Referral / Chronic Care Management: CRR required this visit?  No   CCM required this visit?  No      Plan:     I have personally reviewed and noted the following in the patient's chart:   Medical and social history Use of alcohol, tobacco or illicit drugs  Current medications and supplements including opioid prescriptions. Patient is not currently taking opioid prescriptions. Functional ability and status Nutritional status Physical activity Advanced directives List of other  physicians Hospitalizations, surgeries, and ER visits in previous 12 months Vitals Screenings to include cognitive, depression, and falls Referrals and appointments  In addition, I have reviewed and discussed with patient certain preventive protocols, quality metrics, and best practice recommendations. A written personalized care plan for preventive services as well as general preventive health recommendations were provided to patient.     [name], LPN   5/53/[id number]   Nurse Notes: Obtain Vaccination record to update

## 2022-06-10 NOTE — Telephone Encounter (Signed)
I was able to give verbal Ok to [place] for OT 1x a week for 6 weeks.

## 2022-06-11 NOTE — Telephone Encounter (Signed)
Verbal order for UA and culture left on Connie Marshall's VM.

## 2022-06-12 ENCOUNTER — Other Ambulatory Visit: Payer: Self-pay

## 2022-06-12 NOTE — Patient Outreach (Signed)
Aging Gracefully Program  06/12/2022  Connie Marshall 05/23/1946 [id number]   [facility] Evaluation Interviewer made contact with patient. Aging Gracefully 5 month follow up survey completed.   Sent e-mail to [facility].    Real Management Assistant [phone]

## 2022-06-19 ENCOUNTER — Encounter: Payer: Medicare Other | Admitting: Orthopaedic Surgery

## 2022-06-19 ENCOUNTER — Telehealth: Payer: Self-pay | Admitting: Internal Medicine

## 2022-06-19 MED ORDER — CIPROFLOXACIN HCL 500 MG PO TABS: 500.0000 mg | ORAL_TABLET | Freq: Two times a day (BID) | ORAL | 0 refills | Status: AC

## 2022-06-19 NOTE — Telephone Encounter (Signed)
Patient would like rx sent to [facility] on [address] - if you decide to send in something after you look at labs - please call patient to adive.

## 2022-06-19 NOTE — Telephone Encounter (Signed)
Not sure what labs or meds patient is referring to but please advise.

## 2022-06-19 NOTE — Telephone Encounter (Signed)
Ok this is done - the antibiotic for her urine infection

## 2022-06-20 NOTE — Telephone Encounter (Signed)
Patient notified

## 2022-06-26 ENCOUNTER — Ambulatory Visit (INDEPENDENT_AMBULATORY_CARE_PROVIDER_SITE_OTHER): Payer: Medicare Other | Admitting: Orthopaedic Surgery

## 2022-06-26 DIAGNOSIS — Z96641 Presence of right artificial hip joint: Secondary | ICD-10-CM

## 2022-06-26 NOTE — Progress Notes (Signed)
The patient is 6 weeks status post a right total hip arthroplasty.  She is ambulate with a walker.  She does have swelling in both of her legs.  She still having problems getting in bed at night but overall she reports that she is doing well and has better range of motion and strength.  Her right hip appears to be well located on my exam today.  It moves smoothly and fluidly with just some pain and stiffness.  I did watch her ambulate with a walker.  There really no restrictions for this standpoint.  I want her to listen to her body as she is getting in bed and if she does develop any issues she needs to let us know.  The next time you need to see her back is in 3 months.  At that visit we will have a standing low AP pelvis and lateral of her right operative hip.

## 2022-07-01 ENCOUNTER — Telehealth: Payer: Self-pay | Admitting: Internal Medicine

## 2022-07-01 ENCOUNTER — Telehealth: Payer: Self-pay | Admitting: Orthopaedic Surgery

## 2022-07-01 NOTE — Telephone Encounter (Signed)
Connie Marshall from [facility] called and would like to extend home health PT frequency:  1x 4 w  [phone]

## 2022-07-01 NOTE — Telephone Encounter (Signed)
Verbal order left on VM  

## 2022-07-01 NOTE — Telephone Encounter (Signed)
Ok for verbals 

## 2022-07-01 NOTE — Telephone Encounter (Signed)
Please advise for verbal orders to extend PT

## 2022-07-01 NOTE — Telephone Encounter (Signed)
[place] Name: [facility] Agency Name: [facility] [place] Phone #: [phone] secure line  Service Requested: PT (examples: OT/PT/Skilled Nursing/Social Work/Speech Therapy/Wound Care)  Frequency of Visits: wants to extend for once a week for 4 weeks

## 2022-07-02 NOTE — Telephone Encounter (Signed)
Tillie Rung updated with verbal orders.

## 2022-07-08 ENCOUNTER — Other Ambulatory Visit: Payer: Self-pay | Admitting: Occupational Therapy

## 2022-07-11 NOTE — Patient Outreach (Signed)
Aging Gracefully Program  OT FINAL Visit  07/11/2022  [name] [birth year] [id number]  Visit:  4- Fourth Visit  Start Time:  1300 End Time:  1330 Total Minutes:  30  Readiness to Change:  Readiness to Change Score: 10  Post Clinical Reasoning: Clinician View Of Client Situation:: Connie Marshall has had her hip replacement surgery and is doing well. She is managing her basic ADLs with some assist with household tasks. She reports all of her home modifications and adaptitve equipment have helped in her continuing recovery. Client View Of His/Her Situation:: She is glad her hip surgery is behind her and feels her recovery is going well. No issues reported with her home modifications or adaptive equipment. Next Visit Plan:: This is my final visit with [name]. It has been a pleasure getting to know her and work through the [employer] program with her.  [place], OTR/L Acute Rehab Services Aging Gracefully [phone] Office [phone]

## 2022-09-12 ENCOUNTER — Telehealth: Payer: Self-pay | Admitting: Orthopaedic Surgery

## 2022-09-12 NOTE — Telephone Encounter (Signed)
Note faxed.

## 2022-09-12 NOTE — Telephone Encounter (Signed)
Fax [phone] patient needs orders sent for dental procedure wether she needs it or not dr Elwanda [place] office Patient is in office now

## 2022-09-25 ENCOUNTER — Encounter: Payer: Self-pay | Admitting: Orthopaedic Surgery

## 2022-09-25 ENCOUNTER — Ambulatory Visit (INDEPENDENT_AMBULATORY_CARE_PROVIDER_SITE_OTHER): Payer: Medicare Other | Admitting: Physician Assistant

## 2022-09-25 ENCOUNTER — Ambulatory Visit (INDEPENDENT_AMBULATORY_CARE_PROVIDER_SITE_OTHER): Payer: Medicare Other

## 2022-09-25 DIAGNOSIS — Z96641 Presence of right artificial hip joint: Secondary | ICD-10-CM

## 2022-09-25 MED ORDER — GABAPENTIN 300 MG PO CAPS: 300.0000 mg | ORAL_CAPSULE | Freq: Every day | ORAL | 2 refills | Status: DC

## 2022-09-25 NOTE — Progress Notes (Signed)
Office Visit Note   Patient: Connie Marshall           Date of Birth: [birth year]           MRN: [id number] Visit Date: 09/25/2022              Requested by: [name], MD [place],  [place] [id number] PCP: [name], MD   Assessment & Plan: Visit Diagnoses:  1. Status post total replacement of right hip     Plan: Will place her on Neurontin 300 mg at night to see if this helps with her thigh pain.  Recommend she continue to work on scar tissue mobilization.  This will help desensitize the thigh.  Also recommend she take vitamin B6 100 mg daily.  Will see her back in 3 months to see how she is doing overall.  Follow-Up Instructions: Return in about 3 months (around 12/25/2022).   Orders:  Orders Placed This Encounter  Procedures   XR HIP UNILAT W OR W/O PELVIS 1V RIGHT   Meds ordered this encounter  Medications   gabapentin (NEURONTIN) 300 MG capsule    Sig: Take 1 capsule (300 mg total) by mouth at bedtime.    Dispense:  30 capsule    Refill:  2      Procedures: No procedures performed   Clinical Data: No additional findings.   Subjective: Chief Complaint  Patient presents with   Right Hip - Follow-up    HPI Connie Marshall comes in today for follow-up status post right total hip arthroplasty.  She states that she is happy that she had the hip replacement performed but she does have some spasms and notes numbness over her right thigh that she was not having prior to surgery.  Occasionally has a sharp shocklike sensation in her hip thigh region.  She does ambulate with a cane at times.  She is not ambulating with an assistive device today. Review of Systems Denies any fevers chills.  Objective: Vital Signs: There were no vitals taken for this visit.  Physical Exam General well-developed well-nourished female who ambulates with an antalgic gait. Ortho Exam Right hip full range of motion without pain.  Calf supple nontender.  Dorsiflexion  plantarflexion right ankle intact. Specialty Comments:  No specialty comments available.  Imaging: XR HIP UNILAT W OR W/O PELVIS 1V RIGHT  Result Date: 09/25/2022 AP pelvis lateral view right hip shows hip to be located.  Arthroplasty components well-seated.  No acute fractures or acute findings.    PMFS History: Patient Active Problem List   Diagnosis Date Noted   Status post total replacement of right hip 05/14/2022   Unilateral primary osteoarthritis, right hip 03/27/2022   Preop exam for internal medicine 01/16/2022   Insect bite, infected 02/19/2021   Shingles outbreak 02/19/2021   Dysuria 05/16/2020   Right knee pain 05/16/2020   Carcinoma of upper-outer quadrant of left breast in female, estrogen receptor positive ([place]) 08/25/2019   Bilateral breast cancer ([place]) 08/17/2019   Skin nodule 05/11/2019   B12 deficiency 05/11/2019   Vitamin D deficiency 05/11/2019   Acute lymphadenitis 07/16/2017   Eustachian tube dysfunction, bilateral 07/16/2017   Prolapse of female bladder, acquired 03/25/2017   Sudden right hearing loss 03/25/2017   Left otitis media 03/25/2017   Rash 11/07/2015   Abnormal mammogram 09/13/2015   Blister of toe of left foot 04/13/2015   Breast discharge 04/13/2015   Encounter for well adult exam with abnormal  findings 08/06/2011   Microscopic hematuria 04/27/2010   ANXIETY 03/25/2009   PARESTHESIA 03/23/2009   PERIPHERAL EDEMA 03/23/2009   HEMORRHOIDS, INTERNAL 02/13/2008   DIVERTICULOSIS, COLON 02/13/2008   IBS 02/13/2008   CHOLECYSTECTOMY, LAPAROSCOPIC, HX OF 02/13/2008   OBESITY NOS 07/07/2007   Diabetes ([place]) 07/06/2007   Hyperlipidemia 07/06/2007   Essential hypertension 07/06/2007   Past Medical History:  Diagnosis Date   Axillary mass, left    Cancer (HCC)    Complication of anesthesia    Diverticulosis    Hemorrhoids    Hyperlipidemia    Hypertension    IBS (irritable bowel syndrome)    Obesity    PONV (postoperative nausea and  vomiting)    Pre-diabetes    Psoriasis    Shingles 02/19/2021    Family History  Problem Relation Age of Onset   Hypertension Mother    Diabetes Father    Hypertension Father    Parkinson's disease Sister    Prostate cancer Brother     Past Surgical History:  Procedure Laterality Date   BREAST LUMPECTOMY WITH RADIOACTIVE SEED AND SENTINEL LYMPH NODE BIOPSY Bilateral 10/05/2019   Procedure: BILATERAL BREAST LUMPECTOMY WITH BILATERAL RADIOACTIVE SEEDS AND LEFT SENTINEL LYMPH NODE BIOPSY;  Surgeon: Coralie Keens, MD;  Location: [place];  Service: General;  Laterality: Bilateral;   BREAST SURGERY     CESAREAN SECTION     2 times   CHOLECYSTECTOMY     laproscopic   CHOLECYSTECTOMY, LAPAROSCOPIC     kidney stone removed  10/14/2009   MASS EXCISION Left 07/12/2019   Procedure: EXCISION LEFT AXILLARY MASS;  Surgeon: Coralie Keens, MD;  Location: [place];  Service: General;  Laterality: Left;   RE-EXCISION OF BREAST LUMPECTOMY Left 10/05/2019   Procedure: RE-EXCISION OF LEFT BREAST CANCER;  Surgeon: Coralie Keens, MD;  Location: [place];  Service: General;  Laterality: Left;   THYROGLOSSAL DUCT CYST  10/14/1998   TONSILLECTOMY  10/14/1977   TOTAL HIP ARTHROPLASTY Right 05/14/2022   Procedure: RIGHT TOTAL HIP ARTHROPLASTY ANTERIOR APPROACH;  Surgeon: Mcarthur Rossetti, MD;  Location: [place];  Service: Orthopedics;  Laterality: Right;   umbilical herniorrhapy     VAGINAL HYSTERECTOMY  10/14/2001   Social History   Occupational History   Occupation: Retired  Tobacco Use   Smoking status: Never   Smokeless tobacco: Never  Vaping Use   Vaping Use: Never used  Substance and Sexual Activity   Alcohol use: No   Drug use: No   Sexual activity: Not Currently

## 2022-10-17 LAB — HM MAMMOGRAPHY

## 2022-10-18 ENCOUNTER — Encounter: Payer: Self-pay | Admitting: Hematology and Oncology

## 2022-10-22 ENCOUNTER — Telehealth: Payer: Self-pay | Admitting: *Deleted

## 2022-10-22 NOTE — Telephone Encounter (Signed)
Received call from pt with complaint of left breast swelling x2 months.  Pt denies recent injury or trauma.  Pt requesting office visit for breast exam.  [facility] appt scheduled, pt verbalized understanding of appt date and time.

## 2022-10-23 ENCOUNTER — Telehealth: Payer: Self-pay

## 2022-10-23 ENCOUNTER — Other Ambulatory Visit: Payer: Self-pay

## 2022-10-23 ENCOUNTER — Encounter: Payer: Self-pay | Admitting: Adult Health

## 2022-10-23 ENCOUNTER — Inpatient Hospital Stay: Payer: Medicare Other | Attending: Adult Health | Admitting: Adult Health

## 2022-10-23 VITALS — BP 158/61 | HR 71 | Temp 97.7°F | Resp 16 | Ht 67.0 in | Wt 208.3 lb

## 2022-10-23 DIAGNOSIS — Z170 Estrogen receptor positive status [ER+]: Secondary | ICD-10-CM | POA: Diagnosis not present

## 2022-10-23 DIAGNOSIS — C50412 Malignant neoplasm of upper-outer quadrant of left female breast: Secondary | ICD-10-CM | POA: Diagnosis not present

## 2022-10-23 DIAGNOSIS — Z79811 Long term (current) use of aromatase inhibitors: Secondary | ICD-10-CM | POA: Insufficient documentation

## 2022-10-23 DIAGNOSIS — C50911 Malignant neoplasm of unspecified site of right female breast: Secondary | ICD-10-CM

## 2022-10-23 DIAGNOSIS — Z923 Personal history of irradiation: Secondary | ICD-10-CM | POA: Diagnosis not present

## 2022-10-23 DIAGNOSIS — N63 Unspecified lump in unspecified breast: Secondary | ICD-10-CM | POA: Diagnosis not present

## 2022-10-23 DIAGNOSIS — N6320 Unspecified lump in the left breast, unspecified quadrant: Secondary | ICD-10-CM | POA: Insufficient documentation

## 2022-10-23 DIAGNOSIS — C50912 Malignant neoplasm of unspecified site of left female breast: Secondary | ICD-10-CM

## 2022-10-23 NOTE — Patient Outreach (Signed)
Aging Gracefully Program  10/23/2022  Connie Marshall [birth year] [id number]   [facility] Evaluation Interviewer made contact with patient. Aging Gracefully 9 month survey completed.    [place] Management Assistant [phone]

## 2022-10-23 NOTE — Telephone Encounter (Signed)
Faxed orders for mammogram/ ultrasound to [facility] at [phone], received fax confirmation. Called [facility] and schedule appt for this Friday 1/12 at 0945, arrive at 0930. Connie Marshall verbalized understanding to appt date/time.

## 2022-10-23 NOTE — Assessment & Plan Note (Signed)
Connie Marshall is a 77 year old woman with bilateral breast cancer stage IIa on the left ER/PR positive diagnosed in November 2020 status post bilateral lumpectomies, adjuvant radiation, and antiestrogen therapy with anastrozole which began in March 2021.  Connie Marshall is here for urgent evaluation of her left breast swelling that has gradually worsened over the past 4 months.  Because of how markedly different her left breast is from her right I placed orders for repeat diagnostic mammogram and ultrasound to be completed at [facility] within the next week.  It is unusual for breast lymphedema to occur at this point since her surgery and radiation were in 2021.  Once she has the mammogram and ultrasound we will touch base and discuss next steps.  In the meantime she will continue on anastrozole daily.  He has no other pain anywhere or any other concerns.

## 2022-10-23 NOTE — Progress Notes (Signed)
[facility] Follow up:    [name], MD [place] [address]   DIAGNOSIS:  Cancer Staging  Bilateral breast cancer [facility]) Staging form: Breast, AJCC 8th Edition - Clinical stage from 08/19/2019: Stage IIA (cT3, cN0, cM0, G2, ER+, PR+, HER2-) - Signed by Nicholas Lose, MD on 08/19/2019 Histologic grading system: 3 grade system - Pathologic stage from 10/14/2019: Stage IB (pT3, pN0(sn), cM0, G2, ER+, PR+, HER2-) - Signed by Nicholas Lose, MD on 10/14/2019 Method of lymph node assessment: Sentinel lymph node biopsy Histologic grading system: 3 grade system  Carcinoma of upper-outer quadrant of left breast in female, estrogen receptor positive ([place]) Staging form: Breast, AJCC 8th Edition - Clinical: Stage IIA (cT3, cN0, cM0, G2, ER+, PR+, HER2-) - Signed by Eppie Gibson, MD on 08/25/2019 Stage prefix: Initial diagnosis Histologic grading system: 3 grade system   SUMMARY OF ONCOLOGIC HISTORY: Oncology History  Bilateral breast cancer ([place])  07/12/2019 Initial Diagnosis   Left axillary soft tissue excision: Invasive ductal carcinoma lymphoid tissue not present   08/02/2019 Breast MRI   Right breast enhancing linear mass 6 x 6 x 13 mm, left breast enhancing irregular mass 5 x 15 x 5 mm, no abnormal appearing lymph nodes   08/19/2019 Cancer Staging   Staging form: Breast, AJCC 8th Edition - Clinical stage from 08/19/2019: Stage IIA (cT3, cN0, cM0, G2, ER+, PR+, HER2-) - Signed by Nicholas Lose, MD on 08/19/2019   10/05/2019 Surgery   Right lumpectomy: 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 Left breast reexcision: IDC grade 2, 1.7 cm, intermediate grade DCIS, margins clear, 0/6 lymph nodes negative, ER 95%, PR 95%, HER-2 negative, Ki-67 15%, T3N0   10/14/2019 Cancer Staging   Staging form: Breast, AJCC 8th Edition - Pathologic stage from 10/14/2019:  Stage IB (pT3, pN0(sn), cM0, G2, ER+, PR+, HER2-) - Signed by Nicholas Lose, MD on 10/14/2019   11/23/2019 - 12/22/2019 Radiation Therapy   Adjuvant radiation   12/21/2019 -  Anti-estrogen oral therapy   Anastrozole, '1mg'$  daily, planned duration 5 years   Carcinoma of upper-outer quadrant of left breast in female, estrogen receptor positive ([place])  08/25/2019 Initial Diagnosis   Carcinoma of upper-outer quadrant of left breast in female, estrogen receptor positive ([place])   08/25/2019 Cancer Staging   Staging form: Breast, AJCC 8th Edition - Clinical: Stage IIA (cT3, cN0, cM0, G2, ER+, PR+, HER2-) - Signed by Eppie Gibson, MD on 08/25/2019   09/02/2019 Oncotype testing   Oncotype: score of 0 with a 3% chance of distant recurrence in 9 years with antiestrogen therapy alone.    10/05/2019 Surgery   Bilateral lumpectomies [facility]):  Right breast: intraductal papilloma and no evidence of malignancy  Left breast: IDC with DCIS, grade 2, 1.7cm, clear margins, 6 left axillary lymph nodes negative      CURRENT THERAPY: Anastrozole  INTERVAL HISTORY: Shakeitha Umbaugh 77 y.o. female returns for urgent evaluation of left breast pain/swelling. She has noticed a gradual increase in tenderness and swelling in her left breast since 05/2022.    Her most recent mammogram occurred on 10/17/2022 and showed no mammographic evidence oif malignancy and breast density category B. She tells me that she felt the tech who performed the mammogram was faster than previous which made her uneasy about the process and results of her mammogram.    Patient Active Problem List   Diagnosis Date Noted   Status post total replacement  of right hip 05/14/2022   Unilateral primary osteoarthritis, right hip 03/27/2022   Preop exam for internal medicine 01/16/2022   Insect bite, infected 02/19/2021   Shingles outbreak 02/19/2021   Dysuria 05/16/2020   Right knee pain 05/16/2020   Carcinoma of upper-outer quadrant of left breast  in female, estrogen receptor positive ([place]) 08/25/2019   Bilateral breast cancer ([place]) 08/17/2019   Skin nodule 05/11/2019   B12 deficiency 05/11/2019   Vitamin D deficiency 05/11/2019   Acute lymphadenitis 07/16/2017   Eustachian tube dysfunction, bilateral 07/16/2017   Prolapse of female bladder, acquired 03/25/2017   Sudden right hearing loss 03/25/2017   Left otitis media 03/25/2017   Rash 11/07/2015   Abnormal mammogram 09/13/2015   Blister of toe of left foot 04/13/2015   Breast discharge 04/13/2015   Encounter for well adult exam with abnormal findings 08/06/2011   Microscopic hematuria 04/27/2010   ANXIETY 03/25/2009   PARESTHESIA 03/23/2009   PERIPHERAL EDEMA 03/23/2009   HEMORRHOIDS, INTERNAL 02/13/2008   DIVERTICULOSIS, COLON 02/13/2008   IBS 02/13/2008   CHOLECYSTECTOMY, LAPAROSCOPIC, HX OF 02/13/2008   OBESITY NOS 07/07/2007   Diabetes ([place]) 07/06/2007   Hyperlipidemia 07/06/2007   Essential hypertension 07/06/2007    is allergic to tape and silicone.  MEDICAL HISTORY: Past Medical History:  Diagnosis Date   Axillary mass, left    Cancer (HCC)    Complication of anesthesia    Diverticulosis    Hemorrhoids    Hyperlipidemia    Hypertension    IBS (irritable bowel syndrome)    Obesity    PONV (postoperative nausea and vomiting)    Pre-diabetes    Psoriasis    Shingles 02/19/2021    SURGICAL HISTORY: Past Surgical History:  Procedure Laterality Date   BREAST LUMPECTOMY WITH RADIOACTIVE SEED AND SENTINEL LYMPH NODE BIOPSY Bilateral 10/05/2019   Procedure: BILATERAL BREAST LUMPECTOMY WITH BILATERAL RADIOACTIVE SEEDS AND LEFT SENTINEL LYMPH NODE BIOPSY;  Surgeon: Coralie Keens, MD;  Location: [facility];  Service: General;  Laterality: Bilateral;   BREAST SURGERY     CESAREAN SECTION     2 times   CHOLECYSTECTOMY     laproscopic   CHOLECYSTECTOMY, LAPAROSCOPIC     kidney stone removed  10/14/2009   MASS EXCISION Left 07/12/2019    Procedure: EXCISION LEFT AXILLARY MASS;  Surgeon: Coralie Keens, MD;  Location: [facility];  Service: General;  Laterality: Left;   RE-EXCISION OF BREAST LUMPECTOMY Left 10/05/2019   Procedure: RE-EXCISION OF LEFT BREAST CANCER;  Surgeon: Coralie Keens, MD;  Location: [facility];  Service: General;  Laterality: Left;   THYROGLOSSAL DUCT CYST  10/14/1998   TONSILLECTOMY  10/14/1977   TOTAL HIP ARTHROPLASTY Right 05/14/2022   Procedure: RIGHT TOTAL HIP ARTHROPLASTY ANTERIOR APPROACH;  Surgeon: Mcarthur Rossetti, MD;  Location: [facility];  Service: Orthopedics;  Laterality: Right;   umbilical herniorrhapy     VAGINAL HYSTERECTOMY  10/14/2001    SOCIAL HISTORY: Social History   Socioeconomic History   Marital status: Widowed    Spouse name: Not on file   Number of children: 2   Years of education: college   Highest education level: Not on file  Occupational History   Occupation: Retired  Tobacco Use   Smoking status: Never   Smokeless tobacco: Never  Vaping Use   Vaping Use: Never used  Substance and Sexual Activity   Alcohol use: No   Drug use: No   Sexual activity: Not Currently  Other  Topics Concern   Not on file  Social History Narrative   HSG, [employer] for 2 years. Married, '31-2 years- widowed; '76- 17 years/ widowed '93. 1 son- '77, 1 daughter- '78, 4 grandchildren. 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 Lives alone - I- ADLS.   Social Determinants of Health   Financial Resource Strain: Low Risk  (06/07/2022)   Overall Financial Resource Strain (CARDIA)    Difficulty of Paying Living Expenses: Not hard at all  Food Insecurity: No Food Insecurity (06/07/2022)   Hunger Vital Sign    Worried About Running Out of Food in the Last Year: Never true    Ran Out of Food in the Last Year: Never true  Transportation Needs: No Transportation Needs (06/07/2022)   PRAPARE - Hydrologist  (Medical): No    Lack of Transportation (Non-Medical): No  Physical Activity: Insufficiently Active (06/07/2022)   Exercise Vital Sign    Days of Exercise per Week: 2 days    Minutes of Exercise per Session: 60 min  Stress: No Stress Concern Present (06/07/2022)   [place]    Feeling of Stress : Only a little  Social Connections: Socially Isolated (06/07/2022)   Social Connection and Isolation Panel [NHANES]    Frequency of Communication with Friends and Family: More than three times a week    Frequency of Social Gatherings with Friends and Family: More than three times a week    Attends Religious Services: Never    Marine scientist or Organizations: No    Attends Archivist Meetings: Never    Marital Status: Widowed  Intimate Partner Violence: Not At Risk (06/07/2022)   Humiliation, Afraid, Rape, and Kick questionnaire    Fear of Current or Ex-Partner: No    Emotionally Abused: No    Physically Abused: No    Sexually Abused: No    FAMILY HISTORY: Family History  Problem Relation Age of Onset   Hypertension Mother    Diabetes Father    Hypertension Father    Parkinson's disease Sister    Prostate cancer Brother     Review of Systems  Constitutional:  Negative for appetite change, chills, fatigue, fever and unexpected weight change.  HENT:   Negative for hearing loss, lump/mass and trouble swallowing.   Eyes:  Negative for eye problems and icterus.  Respiratory:  Negative for chest tightness, cough and shortness of breath.   Cardiovascular:  Negative for chest pain, leg swelling and palpitations.  Gastrointestinal:  Negative for abdominal distention, abdominal pain, constipation, diarrhea, nausea and vomiting.  Endocrine: Negative for hot flashes.  Genitourinary:  Negative for difficulty urinating.   Musculoskeletal:  Negative for arthralgias.  Skin:  Negative for itching and rash.   Neurological:  Negative for dizziness, extremity weakness, headaches and numbness.  Hematological:  Negative for adenopathy. Does not bruise/bleed easily.  Psychiatric/Behavioral:  Negative for depression. The patient is not nervous/anxious.       PHYSICAL EXAMINATION  ECOG PERFORMANCE STATUS: 1 - Symptomatic but completely ambulatory  Vitals:   10/23/22 0817  BP: (!) 158/61  Pulse: 71  Resp: 16  Temp: 97.7 F (36.5 C)  SpO2: 100%    Physical Exam Constitutional:      General: She is not in acute distress.    Appearance: Normal appearance. She is not toxic-appearing.  HENT:     Head: Normocephalic and atraumatic.  Eyes:  General: No scleral icterus. Cardiovascular:     Rate and Rhythm: Normal rate and regular rhythm.     Pulses: Normal pulses.     Heart sounds: Normal heart sounds.  Pulmonary:     Effort: Pulmonary effort is normal.     Breath sounds: Normal breath sounds.  Chest:     Comments: Right breast is benign, left breast is swollen approximately 1-1/2 times the size of the right breast.  There are radiation changes present.  Very faint erythema noted, no warmth, no tenderness.  Both of her breasts are very large and pendulous making the exam difficult.  There is no axillary lymphadenopathy palpated bilaterally. Abdominal:     General: Abdomen is flat. Bowel sounds are normal. There is no distension.     Palpations: Abdomen is soft.     Tenderness: There is no abdominal tenderness.  Musculoskeletal:        General: No swelling.     Cervical back: Neck supple.  Lymphadenopathy:     Cervical: No cervical adenopathy.  Skin:    General: Skin is warm and dry.     Findings: No rash.  Neurological:     General: No focal deficit present.     Mental Status: She is alert.  Psychiatric:        Mood and Affect: Mood normal.        Behavior: Behavior normal.     LABORATORY DATA:  CBC    Component Value Date/Time   WBC 4.0 05/15/2022 0445   RBC 3.50  (L) 05/15/2022 0445   HGB 10.2 (L) 05/15/2022 0445   HCT 30.7 (L) 05/15/2022 0445   PLT 133 (L) 05/15/2022 0445   MCV 87.7 05/15/2022 0445   MCH 29.1 05/15/2022 0445   MCHC 33.2 05/15/2022 0445   RDW 12.6 05/15/2022 0445   LYMPHSABS 1.2 01/16/2022 1137   MONOABS 0.3 01/16/2022 1137   EOSABS 0.1 01/16/2022 1137   BASOSABS 0.0 01/16/2022 1137    CMP     Component Value Date/Time   NA 139 05/15/2022 0445   K 4.0 05/15/2022 0445   CL 109 05/15/2022 0445   CO2 24 05/15/2022 0445   GLUCOSE 111 (H) 05/15/2022 0445   GLUCOSE 115 (H) 10/01/2006 0732   BUN 9 05/15/2022 0445   CREATININE 0.75 05/15/2022 0445   CALCIUM 9.2 05/15/2022 0445   PROT 6.7 01/16/2022 1137   ALBUMIN 4.4 01/16/2022 1137   AST 21 01/16/2022 1137   ALT 13 01/16/2022 1137   ALKPHOS 57 01/16/2022 1137   BILITOT 0.5 01/16/2022 1137   GFRNONAA >60 05/15/2022 0445   GFRAA >60 07/08/2019 1004      ASSESSMENT and THERAPY PLAN:   Bilateral breast cancer ([place]) Lyrick is a 77 year old woman with bilateral breast cancer stage IIa on the left ER/PR positive diagnosed in November 2020 status post bilateral lumpectomies, adjuvant radiation, and antiestrogen therapy with anastrozole which began in March 2021.  Eliese is here for urgent evaluation of her left breast swelling that has gradually worsened over the past 4 months.  Because of how markedly different her left breast is from her right I placed orders for repeat diagnostic mammogram and ultrasound to be completed at [facility] within the next week.  It is unusual for breast lymphedema to occur at this point since her surgery and radiation were in 2021.  Once she has the mammogram and ultrasound we will touch base and discuss next steps.  In the meantime she will continue on  anastrozole daily.  He has no other pain anywhere or any other concerns.    All questions were answered. The patient knows to call the clinic with any problems, questions or concerns. We can certainly  see the patient much sooner if necessary.  Total encounter time:30 minutes*in face-to-face visit time, chart review, lab review, care coordination, order entry, and documentation of the encounter time.    Wilber Bihari, NP 10/23/22 8:57 AM Medical Oncology and Hematology [facility] [place], [place] [address] Tel. [phone]    Fax. [phone]  *Total Encounter Time as defined by the Centers for Medicare and Medicaid Services includes, in addition to the face-to-face time of a patient visit (documented in the note above) non-face-to-face time: obtaining and reviewing outside history, ordering and reviewing medications, tests or procedures, care coordination (communications with other health care professionals or caregivers) and documentation in the medical record.

## 2022-10-29 ENCOUNTER — Encounter: Payer: Self-pay | Admitting: Adult Health

## 2022-12-19 ENCOUNTER — Encounter: Payer: Self-pay | Admitting: Radiology

## 2022-12-30 ENCOUNTER — Encounter: Payer: Self-pay | Admitting: Orthopaedic Surgery

## 2022-12-30 ENCOUNTER — Ambulatory Visit (INDEPENDENT_AMBULATORY_CARE_PROVIDER_SITE_OTHER): Payer: Medicare Other | Admitting: Orthopaedic Surgery

## 2022-12-30 DIAGNOSIS — Z96641 Presence of right artificial hip joint: Secondary | ICD-10-CM | POA: Diagnosis not present

## 2022-12-30 NOTE — Progress Notes (Signed)
The patient is a 77 year old female who is now 7 months status post a right total hip arthroplasty.  She says she is doing well and not taking any medications for pain.  She is still having some numbness though.  Her daughter is with her said she also feels like her mom is doing great.  On exam she is walking without assistive device.  I move her right operative hip smoothly and fluidly.  There is still some numbness to be expected.  From my standpoint we will see her back at the 1 year follow-up.  Will have a standing low AP pelvis and lateral of her right hip at that standpoint.  If there is any issues they know to let us know.

## 2023-01-01 ENCOUNTER — Other Ambulatory Visit: Payer: Self-pay | Admitting: Hematology and Oncology

## 2023-01-02 ENCOUNTER — Other Ambulatory Visit: Payer: Self-pay | Admitting: Internal Medicine

## 2023-01-15 ENCOUNTER — Telehealth: Payer: Self-pay

## 2023-01-15 NOTE — Progress Notes (Signed)
   01/15/2023  Patient ID: Connie Marshall, female   DOB: 01-28-1946, 77 y.o.   MRN: [id number]  Patient appearing on report for [employer] - Hypertension Control report due to last documented ambulatory blood pressure of 158/61 on 10/23/22. Next appointment with PCP is 01/29/23   Outreached patient to discuss hypertension control and medication management.   Current antihypertensives: fosinopril 20mg  daily and furosemide 40mg  BID prn leg swelling -Patient has an automated upper arm home BP machine. -Current blood pressure readings: unable to provide but states she does check regularly at nurse from insurance company checks yearly; and these readings have been "good" -Clinic values seem to fluctuate; of note, 1/10 of 158/61 reading was at oncology appointment where pain and erythema were identified, which could have been culprit -Patient only taking furosemide rarely due to increased urination associated.  She also endorses keeping legs/feet elevated, which has decreased swelling -Does not endorse barriers to adherence to BP (or any medications)  Assessment/Plan: - Currently moderately controlled - Recommend continue regular monitoring and follow-up with PCP -If patient consistently >140/90, considering increasing fosinopril dosing to 30 or 40mg  daily and rechecking CMP in 4-6 weeks  Darlina Guys, PharmD, DPLA

## 2023-01-20 ENCOUNTER — Encounter: Payer: Medicare Other | Admitting: Internal Medicine

## 2023-01-27 ENCOUNTER — Encounter: Payer: Self-pay | Admitting: Internal Medicine

## 2023-01-27 ENCOUNTER — Telehealth: Payer: Self-pay | Admitting: Internal Medicine

## 2023-01-27 DIAGNOSIS — E538 Deficiency of other specified B group vitamins: Secondary | ICD-10-CM

## 2023-01-27 DIAGNOSIS — E559 Vitamin D deficiency, unspecified: Secondary | ICD-10-CM

## 2023-01-27 DIAGNOSIS — E1165 Type 2 diabetes mellitus with hyperglycemia: Secondary | ICD-10-CM

## 2023-01-27 NOTE — Telephone Encounter (Signed)
Ok labs are ordered 

## 2023-01-27 NOTE — Telephone Encounter (Signed)
Patient would like to come in and have labs done before her physical on Wednesday 01/29/23. Patient would like a callback when orders have been put in, best callback number is [phone].

## 2023-01-27 NOTE — Telephone Encounter (Signed)
Patient is requesting lab work before ov on 4/17. Patient missed 48mo follow up and has not been seen in a year and was informed she had to be seen and then have labs drawn. Please advise

## 2023-01-28 ENCOUNTER — Other Ambulatory Visit (INDEPENDENT_AMBULATORY_CARE_PROVIDER_SITE_OTHER): Payer: Medicare Other

## 2023-01-28 DIAGNOSIS — E538 Deficiency of other specified B group vitamins: Secondary | ICD-10-CM | POA: Diagnosis not present

## 2023-01-28 DIAGNOSIS — E1165 Type 2 diabetes mellitus with hyperglycemia: Secondary | ICD-10-CM | POA: Diagnosis not present

## 2023-01-28 DIAGNOSIS — E559 Vitamin D deficiency, unspecified: Secondary | ICD-10-CM

## 2023-01-28 LAB — HEPATIC FUNCTION PANEL
ALT: 13 U/L (ref 0–35)
AST: 17 U/L (ref 0–37)
Albumin: 4.3 g/dL (ref 3.5–5.2)
Alkaline Phosphatase: 58 U/L (ref 39–117)
Bilirubin, Direct: 0.2 mg/dL (ref 0.0–0.3)
Total Bilirubin: 0.5 mg/dL (ref 0.2–1.2)
Total Protein: 7 g/dL (ref 6.0–8.3)

## 2023-01-28 LAB — MICROALBUMIN / CREATININE URINE RATIO
Creatinine,U: 76.8 mg/dL
Microalb Creat Ratio: 1.1 mg/g (ref 0.0–30.0)
Microalb, Ur: 0.8 mg/dL (ref 0.0–1.9)

## 2023-01-28 LAB — CBC WITH DIFFERENTIAL/PLATELET
Basophils Absolute: 0 10*3/uL (ref 0.0–0.1)
Basophils Relative: 0.7 % (ref 0.0–3.0)
Eosinophils Absolute: 0.1 10*3/uL (ref 0.0–0.7)
Eosinophils Relative: 4.7 % (ref 0.0–5.0)
HCT: 39.4 % (ref 36.0–46.0)
Hemoglobin: 13.4 g/dL (ref 12.0–15.0)
Lymphocytes Relative: 44.3 % (ref 12.0–46.0)
Lymphs Abs: 1.4 10*3/uL (ref 0.7–4.0)
MCHC: 34 g/dL (ref 30.0–36.0)
MCV: 85.4 fl (ref 78.0–100.0)
Monocytes Absolute: 0.3 10*3/uL (ref 0.1–1.0)
Monocytes Relative: 8.9 % (ref 3.0–12.0)
Neutro Abs: 1.3 10*3/uL — ABNORMAL LOW (ref 1.4–7.7)
Neutrophils Relative %: 41.4 % — ABNORMAL LOW (ref 43.0–77.0)
Platelets: 204 10*3/uL (ref 150.0–400.0)
RBC: 4.62 Mil/uL (ref 3.87–5.11)
RDW: 13.7 % (ref 11.5–15.5)
WBC: 3.1 10*3/uL — ABNORMAL LOW (ref 4.0–10.5)

## 2023-01-28 LAB — BASIC METABOLIC PANEL
BUN: 20 mg/dL (ref 6–23)
CO2: 24 mEq/L (ref 19–32)
Calcium: 9.8 mg/dL (ref 8.4–10.5)
Chloride: 106 mEq/L (ref 96–112)
Creatinine, Ser: 0.7 mg/dL (ref 0.40–1.20)
GFR: 83.69 mL/min (ref >60.00)
Glucose, Bld: 119 mg/dL — ABNORMAL HIGH (ref 70–99)
Potassium: 4.2 mEq/L (ref 3.5–5.1)
Sodium: 137 mEq/L (ref 135–145)

## 2023-01-28 LAB — VITAMIN D 25 HYDROXY (VIT D DEFICIENCY, FRACTURES): VITD: 45.83 ng/mL (ref 30.00–100.00)

## 2023-01-28 LAB — LIPID PANEL
Cholesterol: 121 mg/dL (ref 0–200)
HDL: 55.5 mg/dL (ref >39.00)
LDL Cholesterol: 55 mg/dL (ref 0–99)
NonHDL: 65.99
Total CHOL/HDL Ratio: 2
Triglycerides: 55 mg/dL (ref 0.0–149.0)
VLDL: 11 mg/dL (ref 0.0–40.0)

## 2023-01-28 LAB — URINALYSIS, ROUTINE W REFLEX MICROSCOPIC
Bilirubin Urine: NEGATIVE
Hgb urine dipstick: NEGATIVE
Ketones, ur: NEGATIVE
Leukocytes,Ua: NEGATIVE
Nitrite: NEGATIVE
RBC / HPF: NONE SEEN (ref 0–?)
Specific Gravity, Urine: 1.02 (ref 1.000–1.030)
Total Protein, Urine: NEGATIVE
Urine Glucose: NEGATIVE
Urobilinogen, UA: 0.2 (ref 0.0–1.0)
pH: 6 (ref 5.0–8.0)

## 2023-01-28 LAB — TSH: TSH: 1.83 u[IU]/mL (ref 0.35–5.50)

## 2023-01-28 LAB — VITAMIN B12: Vitamin B-12: 350 pg/mL (ref 211–911)

## 2023-01-28 LAB — HEMOGLOBIN A1C: Hgb A1c MFr Bld: 5.9 % (ref 4.6–6.5)

## 2023-01-29 ENCOUNTER — Encounter: Payer: Self-pay | Admitting: Internal Medicine

## 2023-01-29 ENCOUNTER — Ambulatory Visit (INDEPENDENT_AMBULATORY_CARE_PROVIDER_SITE_OTHER): Payer: Medicare Other | Admitting: Internal Medicine

## 2023-01-29 VITALS — BP 126/78 | HR 63 | Temp 98.7°F | Ht 67.0 in | Wt 214.0 lb

## 2023-01-29 DIAGNOSIS — E538 Deficiency of other specified B group vitamins: Secondary | ICD-10-CM

## 2023-01-29 DIAGNOSIS — E1165 Type 2 diabetes mellitus with hyperglycemia: Secondary | ICD-10-CM

## 2023-01-29 DIAGNOSIS — Z0001 Encounter for general adult medical examination with abnormal findings: Secondary | ICD-10-CM

## 2023-01-29 DIAGNOSIS — E559 Vitamin D deficiency, unspecified: Secondary | ICD-10-CM

## 2023-01-29 DIAGNOSIS — I10 Essential (primary) hypertension: Secondary | ICD-10-CM

## 2023-01-29 DIAGNOSIS — I872 Venous insufficiency (chronic) (peripheral): Secondary | ICD-10-CM

## 2023-01-29 DIAGNOSIS — E7800 Pure hypercholesterolemia, unspecified: Secondary | ICD-10-CM

## 2023-01-29 MED ORDER — FOSINOPRIL SODIUM 20 MG PO TABS: ORAL_TABLET | ORAL | 3 refills | Status: DC

## 2023-01-29 MED ORDER — FUROSEMIDE 40 MG PO TABS: ORAL_TABLET | ORAL | 3 refills | Status: DC

## 2023-01-29 MED ORDER — SIMVASTATIN 20 MG PO TABS: 20.0000 mg | ORAL_TABLET | Freq: Every evening | ORAL | 3 refills | Status: DC

## 2023-01-29 NOTE — Progress Notes (Signed)
Patient ID: Connie Marshall, female   DOB: 1946/08/24, 77 y.o.   MRN: [id number]         Chief Complaint:: wellness exam and venous insufficiency, low b12, dm, htn, hld, low vit d       HPI:  Connie Marshall is a 77 y.o. female here for wellness exam, declines covid booster, o/w up to date                        Also s/p right THR aug 2023; finished home PT but overall rough rehab per pt. Done per Dr Meyer Russel. Has f/u appt next aug 2024.  Pt denies chest pain, increased sob or doe, wheezing, orthopnea, PND, palpitations, dizziness or syncope, but has had mild worsening bilateral leg swelling resolves at night with elevation, worse later in the day.   Pt denies polydipsia, polyuria, or new focal neuro s/s.    Pt denies fever, wt loss, night sweats, loss of appetite, or other constitutional symptoms     Wt Readings from Last 3 Encounters:  01/29/23 214 lb (97.1 kg)  10/23/22 208 lb 4.8 oz (94.5 kg)  06/07/22 197 lb (89.4 kg)   BP Readings from Last 3 Encounters:  01/29/23 126/78  10/23/22 (!) 158/61  05/16/22 135/63   Immunization History  Administered Date(s) Administered   Fluad Quad(high Dose 65+) 07/05/2019, 08/12/2020, 07/18/2022   Influenza Split 08/05/2011, 08/20/2012   Influenza Whole 07/18/2009   Influenza, High Dose Seasonal PF 09/06/2013, 09/13/2015, 08/12/2016, 07/16/2017, 07/21/2018   Influenza,inj,Quad PF,6+ Mos 09/07/2014   Influenza-Unspecified 07/24/2021   PFIZER(Purple Top)SARS-COV-2 Vaccination 11/18/2019, 12/09/2019, 09/29/2020   Pfizer Covid-19 Vaccine Bivalent Booster 68yrs & up 07/10/2021, 08/01/2022   Pneumococcal Conjugate-13 09/27/2013   Pneumococcal Polysaccharide-23 08/05/2011   Td 10/15/2006   Tdap 09/29/2017   Zoster Recombinat (Shingrix) 02/04/2022, 04/10/2022   Zoster, Live 08/02/2010   There are no preventive care reminders to display for this patient.     Past Medical History:  Diagnosis Date   Axillary mass, left    Cancer    Complication of  anesthesia    Diverticulosis    Hemorrhoids    Hyperlipidemia    Hypertension    IBS (irritable bowel syndrome)    Obesity    PONV (postoperative nausea and vomiting)    Pre-diabetes    Psoriasis    Shingles 02/19/2021   Past Surgical History:  Procedure Laterality Date   BREAST LUMPECTOMY WITH RADIOACTIVE SEED AND SENTINEL LYMPH NODE BIOPSY Bilateral 10/05/2019   Procedure: BILATERAL BREAST LUMPECTOMY WITH BILATERAL RADIOACTIVE SEEDS AND LEFT SENTINEL LYMPH NODE BIOPSY;  Surgeon: Abigail Miyamoto, MD;  Location: [facility];  Service: General;  Laterality: Bilateral;   BREAST SURGERY     CESAREAN SECTION     2 times   CHOLECYSTECTOMY     laproscopic   CHOLECYSTECTOMY, LAPAROSCOPIC     kidney stone removed  10/14/2009   MASS EXCISION Left 07/12/2019   Procedure: EXCISION LEFT AXILLARY MASS;  Surgeon: Abigail Miyamoto, MD;  Location: [facility];  Service: General;  Laterality: Left;   RE-EXCISION OF BREAST LUMPECTOMY Left 10/05/2019   Procedure: RE-EXCISION OF LEFT BREAST CANCER;  Surgeon: Abigail Miyamoto, MD;  Location: [facility];  Service: General;  Laterality: Left;   THYROGLOSSAL DUCT CYST  10/14/1998   TONSILLECTOMY  10/14/1977   TOTAL HIP ARTHROPLASTY Right 05/14/2022   Procedure: RIGHT TOTAL HIP ARTHROPLASTY ANTERIOR APPROACH;  Surgeon: Kathryne Hitch, MD;  Location: [facility];  Service: Orthopedics;  Laterality: Right;   umbilical herniorrhapy     VAGINAL HYSTERECTOMY  10/14/2001    reports that she has never smoked. She has never used smokeless tobacco. She reports that she does not drink alcohol and does not use drugs. family history includes Diabetes in her father; Hypertension in her father and mother; Parkinson's disease in her sister; Prostate cancer in her brother. Allergies  Allergen Reactions   Tape     ALLERGIC TO NON-ALLERGIC TAPE-causes a rash   Silicone Rash   Current Outpatient Medications on File Prior  to Visit  Medication Sig Dispense Refill   acetaminophen (TYLENOL) 650 MG CR tablet Take 1,300 mg by mouth 2 (two) times daily.     anastrozole (ARIMIDEX) 1 MG tablet TAKE 1 TABLET(1 MG) BY MOUTH DAILY 90 tablet 3   B Complex-C (SUPER B COMPLEX PO) Take 1 capsule by mouth daily.     Cholecalciferol (VITAMIN D3 SUPER STRENGTH) 50 MCG (2000 UT) TABS Take 4,000 Units by mouth daily.     cyanocobalamin 2000 MCG tablet Take 2,000 mcg by mouth daily.     latanoprost (XALATAN) 0.005 % ophthalmic solution 1 drop at bedtime.     Misc Natural Products (TURMERIC CURCUMIN) CAPS Take 1 capsule by mouth daily. Take for immune system     No current facility-administered medications on file prior to visit.        ROS:  All others reviewed and negative.  Objective        PE:  BP 126/78 (BP Location: Right Arm, Patient Position: Sitting, Cuff Size: Normal)   Pulse 63   Temp 98.7 F (37.1 C) (Oral)   Ht  (1.702 m)   Wt 214 lb (97.1 kg)   SpO2 98%   BMI 33.52 kg/m                 Constitutional: Pt appears in NAD               HENT: Head: NCAT.                Right Ear: External ear normal.                 Left Ear: External ear normal.                Eyes: . Pupils are equal, round, and reactive to light. Conjunctivae and EOM are normal               Nose: without d/c or deformity               Neck: Neck supple. Gross normal ROM               Cardiovascular: Normal rate and regular rhythm.                 Pulmonary/Chest: Effort normal and breath sounds without rales or wheezing.                Abd:  Soft, NT, ND, + BS, no organomegaly               Neurological: Pt is alert. At baseline orientation, motor grossly intact               Skin: Skin is warm. No rashes, no other new lesions, LE edema - trace to 1+ bilat LE edema  Psychiatric: Pt behavior is normal without agitation   Micro: none  Cardiac tracings I have personally interpreted today:  none  Pertinent  Radiological findings (summarize): none   Lab Results  Component Value Date   WBC 3.1 (L) 01/28/2023   HGB 13.4 01/28/2023   HCT 39.4 01/28/2023   PLT 204.0 01/28/2023   GLUCOSE 119 (H) 01/28/2023   CHOL 121 01/28/2023   TRIG 55.0 01/28/2023   HDL 55.50 01/28/2023   LDLDIRECT 115.2 09/26/2008   LDLCALC 55 01/28/2023   ALT 13 01/28/2023   AST 17 01/28/2023   NA 137 01/28/2023   K 4.2 01/28/2023   CL 106 01/28/2023   CREATININE 0.70 01/28/2023   BUN 20 01/28/2023   CO2 24 01/28/2023   TSH 1.83 01/28/2023   HGBA1C 5.9 01/28/2023   MICROALBUR 0.8 01/28/2023   Assessment/Plan:  Charlisha Market is a 77 y.o. Black or African American [2] female with  has a past medical history of Axillary mass, left, Cancer, Complication of anesthesia, Diverticulosis, Hemorrhoids, Hyperlipidemia, Hypertension, IBS (irritable bowel syndrome), Obesity, PONV (postoperative nausea and vomiting), Pre-diabetes, Psoriasis, and Shingles (02/19/2021).  Encounter for well adult exam with abnormal findings Age and sex appropriate education and counseling updated with regular exercise and diet Referrals for preventative services - none needed Immunizations addressed - declines covid booster Smoking counseling  - none needed Evidence for depression or other mood disorder - none significant Most recent labs reviewed. I have personally reviewed and have noted: 1) the patient's medical and social history 2) The patient's current medications and supplements 3) The patient's height, weight, and BMI have been recorded in the chart   B12 deficiency Lab Results  Component Value Date   VITAMINB12 350 01/28/2023   Stable, cont oral replacement - b12 1000 mcg qd   Diabetes (HCC) Lab Results  Component Value Date   HGBA1C 5.9 01/28/2023   Stable, pt to continue current medical treatment  - diet, wt control   Essential hypertension BP Readings from Last 3 Encounters:  01/29/23 126/78  10/23/22 (!) 158/61   05/16/22 135/63   Stable, pt to continue medical treatment monopril 20 qd, lasix 40 qam and 40 qpm prn    Hyperlipidemia Lab Results  Component Value Date   LDLCALC 55 01/28/2023   Stable, pt to continue current statin zocor 20 mg qd   Vitamin D deficiency Last vitamin D Lab Results  Component Value Date   VD25OH 45.83 01/28/2023   Stable, cont oral replacement   Venous insufficiency With mild recent worsening, for lasix 40 qam and then 40 qpm prn persistent swelling, leg elevation, low salt diet, and start compression stockings  Followup: Return in about 1 year (around 01/29/2024).  Oliver Barre, MD 02/01/2023 3:15 PM [facility]

## 2023-01-29 NOTE — Patient Instructions (Signed)

## 2023-02-01 ENCOUNTER — Encounter: Payer: Self-pay | Admitting: Internal Medicine

## 2023-02-01 NOTE — Assessment & Plan Note (Signed)
Last vitamin D Lab Results  Component Value Date   VD25OH 45.83 01/28/2023   Stable, cont oral replacement

## 2023-02-01 NOTE — Assessment & Plan Note (Signed)

## 2023-02-01 NOTE — Assessment & Plan Note (Signed)
Lab Results  Component Value Date   LDLCALC 55 01/28/2023   Stable, pt to continue current statin zocor 20 mg qd

## 2023-02-01 NOTE — Assessment & Plan Note (Signed)
With mild recent worsening, for lasix 40 qam and then 40 qpm prn persistent swelling, leg elevation, low salt diet, and start compression stockings

## 2023-02-01 NOTE — Assessment & Plan Note (Signed)
BP Readings from Last 3 Encounters:  01/29/23 126/78  10/23/22 (!) 158/61  05/16/22 135/63   Stable, pt to continue medical treatment monopril 20 qd, lasix 40 qam and 40 qpm prn

## 2023-02-01 NOTE — Assessment & Plan Note (Signed)
Lab Results  Component Value Date   VITAMINB12 350 01/28/2023   Stable, cont oral replacement - b12 1000 mcg qd

## 2023-02-01 NOTE — Assessment & Plan Note (Signed)
Lab Results  Component Value Date   HGBA1C 5.9 01/28/2023   Stable, pt to continue current medical treatment  - diet, wt control

## 2023-04-02 ENCOUNTER — Ambulatory Visit: Payer: Medicare Other | Admitting: Orthopaedic Surgery

## 2023-04-13 NOTE — Progress Notes (Signed)
Patient Care Team: Corwin Levins, MD as PCP - General (Internal Medicine) Ihor Gully, MD (Inactive) (Urology) Keturah Barre, MD (Otolaryngology) Pershing Proud, RN as Oncology Nurse Navigator Donnelly Angelica, RN as Oncology Nurse Navigator Drema Halon, MD (Inactive) as Consulting Physician (Otolaryngology) Bur[place], Heather, OD as Consulting Physician (Optometry) Axel Filler, Larna Daughters, NP as Nurse Practitioner (Hematology and Oncology)  DIAGNOSIS:  Encounter Diagnosis  Name Primary?   Bilateral malignant neoplasm of breast in female, estrogen receptor positive, unspecified site of breast (HCC) Yes    SUMMARY OF ONCOLOGIC HISTORY: Oncology History  Bilateral breast cancer (HCC)  07/12/2019 Initial Diagnosis   Left axillary soft tissue excision: Invasive ductal carcinoma lymphoid tissue not present   08/02/2019 Breast MRI   Right breast enhancing linear mass 6 x 6 x 13 mm, left breast enhancing irregular mass 5 x 15 x 5 mm, no abnormal appearing lymph nodes   08/19/2019 Cancer Staging   Staging form: Breast, AJCC 8th Edition - Clinical stage from 08/19/2019: Stage IIA (cT3, cN0, cM0, G2, ER+, PR+, HER2-) - Signed by Serena Croissant, MD on 08/19/2019   10/05/2019 Surgery   Right lumpectomy: 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 Left breast reexcision: IDC grade 2, 1.7 cm, intermediate grade DCIS, margins clear, 0/6 lymph nodes negative, ER 95%, PR 95%, HER-2 negative, Ki-67 15%, T3N0   10/14/2019 Cancer Staging   Staging form: Breast, AJCC 8th Edition - Pathologic stage from 10/14/2019: Stage IB (pT3, pN0(sn), cM0, G2, ER+, PR+, HER2-) - Signed by Serena Croissant, MD on 10/14/2019   11/23/2019 - 12/22/2019 Radiation Therapy   Adjuvant radiation   12/21/2019 -  Anti-estrogen oral therapy   Anastrozole, 1mg  daily, planned duration 5 years   Carcinoma of upper-outer quadrant of  left breast in female, estrogen receptor positive (HCC)  08/25/2019 Initial Diagnosis   Carcinoma of upper-outer quadrant of left breast in female, estrogen receptor positive (HCC)   08/25/2019 Cancer Staging   Staging form: Breast, AJCC 8th Edition - Clinical: Stage IIA (cT3, cN0, cM0, G2, ER+, PR+, HER2-) - Signed by Lonie Peak, MD on 08/25/2019   09/02/2019 Oncotype testing   Oncotype: score of 0 with a 3% chance of distant recurrence in 9 years with antiestrogen therapy alone.    10/05/2019 Surgery   Bilateral lumpectomies Magnus Ivan):  Right breast: intraductal papilloma and no evidence of malignancy  Left breast: IDC with DCIS, grade 2, 1.7cm, clear margins, 6 left axillary lymph nodes negative      CHIEF COMPLIANT: Follow-up of left breast cancer on anastrozole   INTERVAL HISTORY: Connie Marshall is a 77 y.o. with above-mentioned history of left breast cancer treated with bilateral lumpectomies, radiation, and is currently on antiestrogen therapy with anastrozole. She presents to the clinic for a follow-up. Pt reports that she has been doing ok. She is recovering from a total hip replacement. She is able to walk now. She does have a shooting pain in breast sometimes accompanied by significant swelling and discomfort.     ALLERGIES:  is allergic to tape and silicone.  MEDICATIONS:  Current Outpatient Medications  Medication Sig Dispense Refill   acetaminophen (TYLENOL) 650 MG CR tablet Take 1,300 mg by mouth 2 (two) times daily.     anastrozole (ARIMIDEX) 1 MG tablet TAKE 1 TABLET(1 MG) BY MOUTH DAILY 90 tablet 3   B Complex-C (SUPER B COMPLEX PO) Take 1 capsule by mouth daily.     Cholecalciferol (  VITAMIN D3 SUPER STRENGTH) 50 MCG (2000 UT) TABS Take 4,000 Units by mouth daily.     cyanocobalamin 2000 MCG tablet Take 2,000 mcg by mouth daily.     fosinopril (MONOPRIL) 20 MG tablet TAKE 1 TABLET(20 MG) BY MOUTH DAILY 90 tablet 3   furosemide (LASIX) 40 MG tablet 1 tab by mouth  in the AM, and 1 in the PM as needed for leg swelling 180 tablet 3   latanoprost (XALATAN) 0.005 % ophthalmic solution 1 drop at bedtime.     Misc Natural Products (TURMERIC CURCUMIN) CAPS Take 1 capsule by mouth daily. Take for immune system     simvastatin (ZOCOR) 20 MG tablet Take 1 tablet (20 mg total) by mouth every evening. 90 tablet 3   No current facility-administered medications for this visit.    PHYSICAL EXAMINATION: ECOG PERFORMANCE STATUS: 1 - Symptomatic but completely ambulatory  Vitals:   04/21/23 0917  BP: (!) 150/75  Pulse: 78  Resp: 18  Temp: 97.7 F (36.5 C)  SpO2: 98%   Filed Weights   04/21/23 0917  Weight: 222 lb 3.2 oz (100.8 kg)    BREAST: No palpable masses or nodules in either right or left breasts.  There is tenderness in the left axilla and the left breast with marked distention lymphedema changes.  No palpable axillary supraclavicular or infraclavicular adenopathy no breast tenderness or nipple discharge. (exam performed in the presence of a chaperone)  LABORATORY DATA:  I have reviewed the data as listed    Latest Ref Rng & Units 01/28/2023    7:55 AM 05/15/2022    4:45 AM 05/08/2022   10:13 AM  CMP  Glucose 70 - 99 mg/dL 161  096  045   BUN 6 - 23 mg/dL 20  9  13    Creatinine 0.40 - 1.20 mg/dL 4.09  8.11  9.14   Sodium 135 - 145 mEq/L 137  139  138   Potassium 3.5 - 5.1 mEq/L 4.2  4.0  4.1   Chloride 96 - 112 mEq/L 106  109  106   CO2 19 - 32 mEq/L 24  24  25    Calcium 8.4 - 10.5 mg/dL 9.8  9.2  78.2   Total Protein 6.0 - 8.3 g/dL 7.0     Total Bilirubin 0.2 - 1.2 mg/dL 0.5     Alkaline Phos 39 - 117 U/L 58     AST 0 - 37 U/L 17     ALT 0 - 35 U/L 13       Lab Results  Component Value Date   WBC 3.1 (L) 01/28/2023   HGB 13.4 01/28/2023   HCT 39.4 01/28/2023   MCV 85.4 01/28/2023   PLT 204.0 01/28/2023   NEUTROABS 1.3 (L) 01/28/2023    ASSESSMENT & PLAN:  Bilateral breast cancer (HCC) 07/12/2019:Left axillary soft tissue  excision: Invasive ductal carcinoma lymphoid tissue not present ER/PR positive HER-2 equivocal by IHC, FISH pending Because the primary size is at least more than 5 cm, T3N0   10/05/2019:Right lumpectomy: 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 Left breast reexcision: IDC grade 2, 1.7 cm, intermediate grade DCIS, margins clear, 0/6 lymph nodes negative, ER 95%, PR 95%, HER-2 negative, Ki-67 15%, T3N0 Stage Ib pathologic stage   Oncotype score: 0, distant recurrence at 9 years: 3% Adjuvant radiation: 11/23/2019-12/21/2019   Treatment plan: Adjuvant antiestrogen therapy with anastrozole 1 mg daily x5 years started march 2021 Anastrozole  Toxicities: 1.  Diffuse muscle aches and stiffness: Takes Advil Tylenol and turmeric status post right hip replacement surgery and doing significantly better with this. 2.  Hot flashes: Resolved   Breast Cancer Surveillance: Mammograms 10/25/2022: Benign.  Breast density category B Bone Density: 11/09/20:T score -0.2  Breast exam 04/21/2023: Marked lymphedema in the left breast.  I recommended physical therapy evaluation and consideration for a lymphedema sleeve to reduce the swelling.   Return to clinic in 1 year for follow-up    Orders Placed This Encounter  Procedures   Ambulatory referral to Physical Therapy    Referral Priority:   Routine    Referral Type:   Physical Medicine    Referral Reason:   Specialty Services Required    Requested Specialty:   Physical Therapy    Number of Visits Requested:   1   The patient has a good understanding of the overall plan. she agrees with it. she will call with any problems that may develop before the next visit here. Total time spent: 30 mins including face to face time and time spent for planning, charting and co-ordination of care   Tamsen Meek, MD 04/21/23    I Janan Ridge am acting as a Neurosurgeon for [employer]  I have reviewed the above documentation for accuracy and completeness, and I agree with the above.

## 2023-04-21 ENCOUNTER — Other Ambulatory Visit: Payer: Self-pay

## 2023-04-21 ENCOUNTER — Inpatient Hospital Stay: Payer: Medicare Other | Attending: Hematology and Oncology | Admitting: Hematology and Oncology

## 2023-04-21 VITALS — BP 150/75 | HR 78 | Temp 97.7°F | Resp 18 | Ht 67.0 in | Wt 222.2 lb

## 2023-04-21 DIAGNOSIS — Z79899 Other long term (current) drug therapy: Secondary | ICD-10-CM | POA: Diagnosis not present

## 2023-04-21 DIAGNOSIS — Z96641 Presence of right artificial hip joint: Secondary | ICD-10-CM | POA: Insufficient documentation

## 2023-04-21 DIAGNOSIS — C50912 Malignant neoplasm of unspecified site of left female breast: Secondary | ICD-10-CM

## 2023-04-21 DIAGNOSIS — Z79811 Long term (current) use of aromatase inhibitors: Secondary | ICD-10-CM | POA: Diagnosis not present

## 2023-04-21 DIAGNOSIS — Z170 Estrogen receptor positive status [ER+]: Secondary | ICD-10-CM | POA: Insufficient documentation

## 2023-04-21 DIAGNOSIS — Z923 Personal history of irradiation: Secondary | ICD-10-CM | POA: Diagnosis not present

## 2023-04-21 DIAGNOSIS — C50412 Malignant neoplasm of upper-outer quadrant of left female breast: Secondary | ICD-10-CM | POA: Insufficient documentation

## 2023-04-21 DIAGNOSIS — I890 Lymphedema, not elsewhere classified: Secondary | ICD-10-CM | POA: Diagnosis not present

## 2023-04-21 DIAGNOSIS — C50911 Malignant neoplasm of unspecified site of right female breast: Secondary | ICD-10-CM

## 2023-04-21 NOTE — Assessment & Plan Note (Addendum)
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 Left breast reexcision: IDC grade 2, 1.7 cm, intermediate grade DCIS, margins clear, 0/6 lymph nodes negative, ER 95%, PR 95%, HER-2 negative, Ki-67 15%, T3N0 Stage Ib pathologic stage   Oncotype score: 0, distant recurrence at 9 years: 3% Adjuvant radiation: 11/23/2019-12/21/2019   Treatment plan: Adjuvant antiestrogen therapy with anastrozole 1 mg daily x5 years started march 2021 Anastrozole Toxicities: 1.  Diffuse muscle aches and stiffness: Takes Advil Tylenol and turmeric status post right hip replacement surgery and doing significantly better with this. 2.  Hot flashes: Resolved   Breast Cancer Surveillance: Mammograms 10/25/2022: Benign.  Breast density category B Bone Density: 11/09/20:T score -0.2  Breast exam 04/21/2023: Marked lymphedema in the left breast.  I recommended physical therapy evaluation and consideration for a lymphedema sleeve to reduce the swelling.   Return to clinic in 1 year for follow-up

## 2023-04-22 ENCOUNTER — Telehealth: Payer: Self-pay

## 2023-04-22 NOTE — Telephone Encounter (Signed)
Pt called to let us know she was scheduled for lymphedema eval and wanted to know why it wasn't scheduled sooner. Per Lindsey's note in January, we were to f/u after her MM/US results to discuss if lymphedema tx was necessary. Pt was tearful on the phone and verbalized she was upset this was not taken care of sooner.  Expressed my apologies to the pt and she verbalized thanks and understanding.

## 2023-04-24 ENCOUNTER — Ambulatory Visit: Payer: Medicare Other | Attending: Hematology and Oncology | Admitting: Physical Therapy

## 2023-04-24 ENCOUNTER — Emergency Department (HOSPITAL_BASED_OUTPATIENT_CLINIC_OR_DEPARTMENT_OTHER)
Admission: EM | Admit: 2023-04-24 | Discharge: 2023-04-24 | Disposition: A | Discharge status: Home / Self Care | Payer: Medicare Other | Attending: Emergency Medicine | Admitting: Emergency Medicine

## 2023-04-24 ENCOUNTER — Other Ambulatory Visit (HOSPITAL_BASED_OUTPATIENT_CLINIC_OR_DEPARTMENT_OTHER): Payer: Self-pay

## 2023-04-24 ENCOUNTER — Encounter (HOSPITAL_BASED_OUTPATIENT_CLINIC_OR_DEPARTMENT_OTHER): Payer: Self-pay | Admitting: Emergency Medicine

## 2023-04-24 ENCOUNTER — Other Ambulatory Visit: Payer: Self-pay

## 2023-04-24 DIAGNOSIS — R42 Dizziness and giddiness: Secondary | ICD-10-CM | POA: Diagnosis present

## 2023-04-24 DIAGNOSIS — C50911 Malignant neoplasm of unspecified site of right female breast: Secondary | ICD-10-CM | POA: Insufficient documentation

## 2023-04-24 DIAGNOSIS — R2243 Localized swelling, mass and lump, lower limb, bilateral: Secondary | ICD-10-CM | POA: Insufficient documentation

## 2023-04-24 DIAGNOSIS — I10 Essential (primary) hypertension: Secondary | ICD-10-CM | POA: Diagnosis not present

## 2023-04-24 DIAGNOSIS — E119 Type 2 diabetes mellitus without complications: Secondary | ICD-10-CM | POA: Diagnosis not present

## 2023-04-24 DIAGNOSIS — C50912 Malignant neoplasm of unspecified site of left female breast: Secondary | ICD-10-CM | POA: Insufficient documentation

## 2023-04-24 DIAGNOSIS — Z170 Estrogen receptor positive status [ER+]: Secondary | ICD-10-CM | POA: Insufficient documentation

## 2023-04-24 DIAGNOSIS — R030 Elevated blood-pressure reading, without diagnosis of hypertension: Secondary | ICD-10-CM

## 2023-04-24 LAB — CBC WITH DIFFERENTIAL/PLATELET
Abs Immature Granulocytes: 0 10*3/uL (ref 0.00–0.07)
Basophils Absolute: 0 10*3/uL (ref 0.0–0.1)
Basophils Relative: 1 %
Eosinophils Absolute: 0.1 10*3/uL (ref 0.0–0.5)
Eosinophils Relative: 4 %
HCT: 41.7 % (ref 36.0–46.0)
Hemoglobin: 13.7 g/dL (ref 12.0–15.0)
Immature Granulocytes: 0 %
Lymphocytes Relative: 38 %
Lymphs Abs: 1.2 10*3/uL (ref 0.7–4.0)
MCH: 29 pg (ref 26.0–34.0)
MCHC: 32.9 g/dL (ref 30.0–36.0)
MCV: 88.3 fL (ref 80.0–100.0)
Monocytes Absolute: 0.3 10*3/uL (ref 0.1–1.0)
Monocytes Relative: 9 %
Neutro Abs: 1.6 10*3/uL — ABNORMAL LOW (ref 1.7–7.7)
Neutrophils Relative %: 48 %
Platelets: 188 10*3/uL (ref 150–400)
RBC: 4.72 MIL/uL (ref 3.87–5.11)
RDW: 13.2 % (ref 11.5–15.5)
WBC: 3.2 10*3/uL — ABNORMAL LOW (ref 4.0–10.5)
nRBC: 0 % (ref 0.0–0.2)

## 2023-04-24 LAB — BASIC METABOLIC PANEL
Anion gap: 8 (ref 5–15)
BUN: 15 mg/dL (ref 8–23)
CO2: 24 mmol/L (ref 22–32)
Calcium: 10.3 mg/dL (ref 8.9–10.3)
Chloride: 106 mmol/L (ref 98–111)
Creatinine, Ser: 0.67 mg/dL (ref 0.44–1.00)
GFR, Estimated: >60 mL/min (ref >60)
Glucose, Bld: 114 mg/dL — ABNORMAL HIGH (ref 70–99)
Potassium: 3.9 mmol/L (ref 3.5–5.1)
Sodium: 138 mmol/L (ref 135–145)

## 2023-04-24 MED ORDER — HYDRALAZINE HCL 10 MG PO TABS
10.0000 mg | ORAL_TABLET | Freq: Three times a day (TID) | ORAL | 0 refills | Status: DC | PRN
  Filled 2023-04-24: qty 21, 7d supply, fill #0

## 2023-04-24 NOTE — Discharge Instructions (Signed)
Your history, exam, and evaluation today were overall reassuring.  I suspect that your lightheaded episode yesterday was due to standing up and as we discussed some fluid shifts related to the recent dietary changes with the holiday.  Please follow-up with your regular doctor and continue taking your Lasix diuretic.  Your blood pressure was more elevated today again so please consider filling the prescription for the hydralazine to take if the blood pressure is over 170 and you are symptomatic in addition to your home medicine.  Please rest and stay hydrated and call your doctor to get seen as soon as possible.  We had a shared decision-making conversation including offering to do more extensive lab and imaging workup however given your lack of other symptoms we agreed to hold on this today.  If any symptoms change or worsen acutely, please return to the nearest emergency department.

## 2023-04-24 NOTE — ED Triage Notes (Signed)
Pt arrives to ED with c/o dizziness since yesterday and elevated blood pressure readings today.

## 2023-04-24 NOTE — Therapy (Addendum)
OUTPATIENT PHYSICAL THERAPY  UPPER EXTREMITY ONCOLOGY EVALUATION  Patient Name: Connie Marshall MRN: [id number] DOB:03-Nov-1945, 77 y.o., female Today's Date: 04/24/2023  END OF SESSION:  PT End of Session - 04/24/23 0925     Visit Number 0   no visit number - arrived no charge   PT Start Time 0907    PT Stop Time 0920    PT Time Calculation (min) 13 min    Activity Tolerance Treatment limited secondary to medical complications (Comment)   high blood pressure, dizzy   Behavior During Therapy Anxious             Past Medical History:  Diagnosis Date   Axillary mass, left    Cancer (HCC)    Complication of anesthesia    Diverticulosis    Hemorrhoids    Hyperlipidemia    Hypertension    IBS (irritable bowel syndrome)    Obesity    PONV (postoperative nausea and vomiting)    Pre-diabetes    Psoriasis    Shingles 02/19/2021   Past Surgical History:  Procedure Laterality Date   BREAST LUMPECTOMY WITH RADIOACTIVE SEED AND SENTINEL LYMPH NODE BIOPSY Bilateral 10/05/2019   Procedure: BILATERAL BREAST LUMPECTOMY WITH BILATERAL RADIOACTIVE SEEDS AND LEFT SENTINEL LYMPH NODE BIOPSY;  Surgeon: Abigail Miyamoto, MD;  Location: [facility];  Service: General;  Laterality: Bilateral;   BREAST SURGERY     CESAREAN SECTION     2 times   CHOLECYSTECTOMY     laproscopic   CHOLECYSTECTOMY, LAPAROSCOPIC     kidney stone removed  10/14/2009   MASS EXCISION Left 07/12/2019   Procedure: EXCISION LEFT AXILLARY MASS;  Surgeon: Abigail Miyamoto, MD;  Location: [facility];  Service: General;  Laterality: Left;   RE-EXCISION OF BREAST LUMPECTOMY Left 10/05/2019   Procedure: RE-EXCISION OF LEFT BREAST CANCER;  Surgeon: Abigail Miyamoto, MD;  Location: [facility];  Service: General;  Laterality: Left;   THYROGLOSSAL DUCT CYST  10/14/1998   TONSILLECTOMY  10/14/1977   TOTAL HIP ARTHROPLASTY Right 05/14/2022   Procedure: RIGHT TOTAL HIP ARTHROPLASTY  ANTERIOR APPROACH;  Surgeon: Kathryne Hitch, MD;  Location: [facility];  Service: Orthopedics;  Laterality: Right;   umbilical herniorrhapy     VAGINAL HYSTERECTOMY  10/14/2001   Patient Active Problem List   Diagnosis Date Noted   Venous insufficiency 01/29/2023   Status post total replacement of right hip 05/14/2022   Unilateral primary osteoarthritis, right hip 03/27/2022   Preop exam for internal medicine 01/16/2022   Insect bite, infected 02/19/2021   Shingles outbreak 02/19/2021   Dysuria 05/16/2020   Right knee pain 05/16/2020   Carcinoma of upper-outer quadrant of left breast in female, estrogen receptor positive (HCC) 08/25/2019   Bilateral breast cancer (HCC) 08/17/2019   Skin nodule 05/11/2019   B12 deficiency 05/11/2019   Vitamin D deficiency 05/11/2019   Acute lymphadenitis 07/16/2017   Eustachian tube dysfunction, bilateral 07/16/2017   Prolapse of female bladder, acquired 03/25/2017   Sudden right hearing loss 03/25/2017   Left otitis media 03/25/2017   Rash 11/07/2015   Abnormal mammogram 09/13/2015   Blister of toe of left foot 04/13/2015   Breast discharge 04/13/2015   Encounter for well adult exam with abnormal findings 08/06/2011   Microscopic hematuria 04/27/2010   ANXIETY 03/25/2009   PARESTHESIA 03/23/2009   PERIPHERAL EDEMA 03/23/2009   HEMORRHOIDS, INTERNAL 02/13/2008   DIVERTICULOSIS, COLON 02/13/2008   IBS 02/13/2008   CHOLECYSTECTOMY, LAPAROSCOPIC, HX OF 02/13/2008  OBESITY NOS 07/07/2007   Diabetes (HCC) 07/06/2007   Hyperlipidemia 07/06/2007   Essential hypertension 07/06/2007    PCP: Oliver Barre, MD  REFERRING PROVIDER: Serena Croissant, MD  REFERRING DIAG: C50.911,Z17.0,C50.912 (ICD-10-CM) - Bilateral malignant neoplasm of breast in female, estrogen receptor positive, unspecified site of breast (HCC)   THERAPY DIAG:  Malignant neoplasm of right female breast, unspecified estrogen receptor status, unspecified site of breast  (HCC)  Malignant neoplasm of left breast in female, estrogen receptor positive, unspecified site of breast (HCC)  ONSET DATE: not reported today  Rationale for Evaluation and Treatment: Rehabilitation  SUBJECTIVE:                                                                                                                                                                                           SUBJECTIVE STATEMENT: I have been feeling dizzy since yesterday. Can you check my blood pressure?  PERTINENT HISTORY: Hx of bilateral breast cancer- 10/05/19 R lumpectomy - intraductal papilloma no malignacy, L lumpectomy - fibrocystic change intraductal pailloma no malignacy idencidinfed, L breast re excision IDC grade 2, DCIS ER/PR+, HER2- 0/6 nodes Ki-67- 15%, completed radiation 12/22/2019    ASSESSMENT:  CLINICAL IMPRESSION: Pt arrived for her evaluation but was not feeling well. She reports dizziness that began yesterday. She called the nurse with [facility] who advised her to lie down with her head propped up. She had her daughter bring her to her PT evaluation due to not feeling well and still feeling dizzy and "off." Therapist took pt's blood pressure twice. The first time was 179/86 and the second time was 186/97. Her pulse was 72. Educated pt that she should go the the emergency room. Pt declined an ambulance and pt's daughter was going to drive her to [facility] ER. Therapist called the [facility] to make them aware. Spoke with the nurse and let them know her symptoms and BP. Will reschedule the eval for another time once pt is feeling better.    [employer], PT 04/24/2023, 9:35 AM

## 2023-04-24 NOTE — ED Notes (Signed)
Pt ambulated around room with even steady slow gait, no apparent distress, pt denies dizziness.

## 2023-04-24 NOTE — ED Notes (Signed)
Pt ambulated to the restroom and back to room with this RN, even steady gait, no apparent distress.

## 2023-04-24 NOTE — ED Notes (Signed)
Pt discharged in stable condition. Pt and daughter expressed understanding about discharge instructions and rx, and to follow up with pcp and to return to ER for any further concerns or complications. Pt ambulated around room with no apparent distress, then requested a w/c and was brought out to lobby via w/c in stable condition.

## 2023-04-24 NOTE — ED Provider Notes (Signed)
[facility] AT [facility] Provider Note   CSN: [id number] Arrival date & time: 04/24/23  0932     History  Chief Complaint  Patient presents with   Dizziness   Hypertension    Connie Marshall is a 77 y.o. female.  The history is provided by the patient and medical records. No language interpreter was used.  Dizziness Quality:  Lightheadedness Severity:  Moderate Onset quality:  Gradual Timing:  Rare Progression:  Resolved Chronicity:  New Relieved by:  Nothing Worsened by:  Nothing Ineffective treatments:  None tried Associated symptoms: no chest pain, no diarrhea, no headaches, no nausea, no palpitations, no shortness of breath, no syncope, no vomiting and no weakness   Hypertension Pertinent negatives include no chest pain, no abdominal pain, no headaches and no shortness of breath.       Home Medications Prior to Admission medications   Medication Sig Start Date End Date Taking? Authorizing Provider  acetaminophen (TYLENOL) 650 MG CR tablet Take 1,300 mg by mouth 2 (two) times daily.    [provider]  anastrozole (ARIMIDEX) 1 MG tablet TAKE 1 TABLET(1 MG) BY MOUTH DAILY 01/02/23   Serena Croissant, MD  B Complex-C (SUPER B COMPLEX PO) Take 1 capsule by mouth daily.    [provider]  Cholecalciferol (VITAMIN D3 SUPER STRENGTH) 50 MCG (2000 UT) TABS Take 4,000 Units by mouth daily.    [provider]  cyanocobalamin 2000 MCG tablet Take 2,000 mcg by mouth daily.    [provider]  fosinopril (MONOPRIL) 20 MG tablet TAKE 1 TABLET(20 MG) BY MOUTH DAILY 01/29/23   Corwin Levins, MD  furosemide (LASIX) 40 MG tablet 1 tab by mouth in the AM, and 1 in the PM as needed for leg swelling 01/29/23   Corwin Levins, MD  latanoprost (XALATAN) 0.005 % ophthalmic solution 1 drop at bedtime.    [provider]  Misc Natural Products (TURMERIC CURCUMIN) CAPS Take 1 capsule by mouth daily. Take for immune system     [provider]  simvastatin (ZOCOR) 20 MG tablet Take 1 tablet (20 mg total) by mouth every evening. 01/29/23   Corwin Levins, MD      Allergies    Tape and Silicone    Review of Systems   Review of Systems  Constitutional:  Positive for fatigue. Negative for chills and fever.  HENT:  Negative for congestion.   Respiratory:  Negative for chest tightness and shortness of breath.   Cardiovascular:  Negative for chest pain, palpitations and syncope.  Gastrointestinal:  Negative for abdominal pain, constipation, diarrhea, nausea and vomiting.  Genitourinary:  Negative for dysuria.  Musculoskeletal:  Negative for back pain, neck pain and neck stiffness.  Skin:  Negative for rash and wound.  Neurological:  Positive for light-headedness. Negative for dizziness, syncope, weakness and headaches.  Psychiatric/Behavioral:  Negative for agitation and confusion.     Physical Exam Updated Vital Signs BP (!) 159/90   Pulse 61   Temp 98.2 F (36.8 C)   Resp 14   SpO2 95%  Physical Exam Vitals and nursing note reviewed.  Constitutional:      General: She is not in acute distress.    Appearance: She is well-developed. She is not ill-appearing, toxic-appearing or diaphoretic.  HENT:     Head: Normocephalic and atraumatic.     Nose: No congestion or rhinorrhea.     Mouth/Throat:     Mouth: Mucous membranes are moist.  Pharynx: No oropharyngeal exudate or posterior oropharyngeal erythema.  Eyes:     Extraocular Movements: Extraocular movements intact.     Conjunctiva/sclera: Conjunctivae normal.     Pupils: Pupils are equal, round, and reactive to light.  Cardiovascular:     Rate and Rhythm: Normal rate and regular rhythm.     Heart sounds: No murmur heard. Pulmonary:     Effort: Pulmonary effort is normal. No respiratory distress.     Breath sounds: Normal breath sounds. No wheezing, rhonchi or rales.  Chest:     Chest wall: No tenderness.  Abdominal:     General: Abdomen  is flat.     Palpations: Abdomen is soft.     Tenderness: There is no abdominal tenderness. There is no right CVA tenderness, left CVA tenderness, guarding or rebound.  Musculoskeletal:        General: No swelling or tenderness.     Cervical back: Neck supple.     Right lower leg: Edema (mild) present.     Left lower leg: Edema (mild) present.  Skin:    General: Skin is warm and dry.     Capillary Refill: Capillary refill takes less than 2 seconds.     Findings: No erythema or rash.  Neurological:     General: No focal deficit present.     Mental Status: She is alert.     Sensory: No sensory deficit.     Motor: No weakness.  Psychiatric:        Mood and Affect: Mood normal.     ED Results / Procedures / Treatments   Labs (all labs ordered are listed, but only abnormal results are displayed) Labs Reviewed  BASIC METABOLIC PANEL - Abnormal; Notable for the following components:      Result Value   Glucose, Bld 114 (*)    All other components within normal limits  CBC WITH DIFFERENTIAL/PLATELET - Abnormal; Notable for the following components:   WBC 3.2 (*)    Neutro Abs 1.6 (*)    All other components within normal limits    EKG None  Radiology No results found.  Procedures Procedures    Medications Ordered in ED Medications - No data to display  ED Course/ Medical Decision Making/ A&P                             Medical Decision Making Amount and/or Complexity of Data Reviewed Labs: ordered.  Risk Prescription drug management.    Connie Marshall is a 77 y.o. female with a past medical history significant for hypertension, hyperlipidemia, diabetes, previous cholecystectomy, obesity, previous breast cancer, shingles, and irritable bowel syndrome who presents for a lightheaded episode and elevated blood pressures.  According to patient, her blood pressure has been elevated recently and was in the 150s several days ago at her PCP.  She reports that yesterday while  getting up after working on the computer for a while, she felt very lightheaded had to catch herself.  She did not actually pass out but felt lightheaded.  She reports it more of a lightheaded than a room spinning dizziness.  She had no other focal neurologic complaints and had to sit down.  She reports that she drank some fluids, rested, put her legs up, and even had some juice and then symptoms improved.  She reports today she was going to physical therapy and told them about her lightheaded episode and how the patient felt  like her blood pressure is elevated.  When they checked it it was in the 160s and so she was transferred here for further evaluation.  She otherwise denies any headache, neck pain, and denies any focal neurologic complaints.  She reports the lightheadedness today was very mild and it was nothing like yesterday.  She reports that she has not had any chest pain, palpitations, or shortness of breath.  Denies any cough, congestion.  Denies any nausea vomiting, constipation, diarrhea, or urinary changes.  She is otherwise feeling well today.  She is worried about her blood pressure being elevated in the brief lightheaded episode.  Patient does report that she previously was taking Lasix daily but had cut back.  She only takes it occasionally but does not like how it makes her urinate more.  On my exam, she does have edema in both legs that she reports is similar to prior.  Chest and abdomen nontender.  Lungs clear.  No murmur.  No focal neurologic deficits on my exam.  Patient well-appearing and was able to ambulate to the bathroom and back per nursing.  Patient otherwise resting.  Patient had workup with some basic labs in triage that did not show evidence of AKI or significant electrolyte abnormality.  No symptomatic anemia or leukocytosis.  Family reports that they think she has put on some amount of fluids after eating less healthy foods for 4 July and not taking the Lasix.  Clinically  I do suspect there is some degree of fluid shifts going on contribute to this patient's lightheaded episode.  She is not hypotensive here and is actually slightly hypertensive in the 150s and 160s.  She had no focal neurologic deficit on my exam.  With her labs reassuring, we had a shared decision-making conversation offering more extensive workup to look for occult infection such as urinalysis, chest x-ray, or even imaging of the head however given her lack of other symptoms and resolution of symptoms now, we agreed to hold on this.  Patient did want something to help with the elevated blood pressure so we will give her a short course of some hydralazine to use as as needed rescue medicine as her heart rate was in the 60s did not want to give a beta-blocker.  Patient will call her doctor in follow-up and she will take her Lasix.  I suspect some fluid overload may have contributed to her blood pressure being elevated.  Patient agrees and will watch her sodium intake and talk to her regular doctor.  She will take the new prescription medication.  She agreed with plan of care and agreed to hold on more extensive lab and imaging workup today.  She had no other questions or concerns and was discharged in good condition.         Final Clinical Impression(s) / ED Diagnoses Final diagnoses:  Elevated blood pressure reading  Lightheaded    Rx / DC Orders ED Discharge Orders          Ordered    hydrALAZINE (APRESOLINE) 10 MG tablet  3 times daily PRN        04/24/23 1513            Clinical Impression: 1. Elevated blood pressure reading   2. Lightheaded     Disposition: Discharge  Condition: Good  I have discussed the results, Dx and Tx plan with the pt(& family if present). He/she/they expressed understanding and agree(s) with the plan. Discharge instructions discussed at great length.  Strict return precautions discussed and pt &/or family have verbalized understanding of the  instructions. No further questions at time of discharge.    Discharge Medication List as of 04/24/2023  3:15 PM     START taking these medications   Details  hydrALAZINE (APRESOLINE) 10 MG tablet Take 1 tablet (10 mg total) by mouth 3 (three) times daily as needed (If blood pressure over 170 and you are feeling symptomatic.)., Starting Thu 04/24/2023, Print        Follow Up: Corwin Levins, MD [address] [place] [address] [phone]     [facility] Emergency Department at [facility] [address] [place] [address] [phone]       Racine Erby, Canary Brim, MD 04/24/23 1623

## 2023-05-14 ENCOUNTER — Encounter (INDEPENDENT_AMBULATORY_CARE_PROVIDER_SITE_OTHER): Payer: Self-pay

## 2023-05-21 ENCOUNTER — Ambulatory Visit: Payer: Medicare Other | Attending: Hematology and Oncology | Admitting: Physical Therapy

## 2023-05-21 ENCOUNTER — Other Ambulatory Visit: Payer: Self-pay

## 2023-05-21 ENCOUNTER — Encounter: Payer: Self-pay | Admitting: Physical Therapy

## 2023-05-21 DIAGNOSIS — C50911 Malignant neoplasm of unspecified site of right female breast: Secondary | ICD-10-CM | POA: Diagnosis present

## 2023-05-21 DIAGNOSIS — I890 Lymphedema, not elsewhere classified: Secondary | ICD-10-CM | POA: Diagnosis present

## 2023-05-21 DIAGNOSIS — M25612 Stiffness of left shoulder, not elsewhere classified: Secondary | ICD-10-CM | POA: Diagnosis present

## 2023-05-21 DIAGNOSIS — Z170 Estrogen receptor positive status [ER+]: Secondary | ICD-10-CM | POA: Diagnosis present

## 2023-05-21 DIAGNOSIS — L599 Disorder of the skin and subcutaneous tissue related to radiation, unspecified: Secondary | ICD-10-CM | POA: Insufficient documentation

## 2023-05-21 DIAGNOSIS — R293 Abnormal posture: Secondary | ICD-10-CM | POA: Diagnosis present

## 2023-05-21 DIAGNOSIS — C50912 Malignant neoplasm of unspecified site of left female breast: Secondary | ICD-10-CM | POA: Insufficient documentation

## 2023-05-21 NOTE — Therapy (Signed)
OUTPATIENT PHYSICAL THERAPY  UPPER EXTREMITY ONCOLOGY EVALUATION  Patient Name: Connie Marshall MRN: [id number] DOB:1946-08-22, 77 y.o., female Today's Date: 05/21/2023  END OF SESSION:  PT End of Session - 05/21/23 0912     Visit Number 1    Number of Visits 9    Date for PT Re-Evaluation 06/18/23    PT Start Time 0910    PT Stop Time 0947    PT Time Calculation (min) 37 min    Activity Tolerance Patient tolerated treatment well    Behavior During Therapy [facility] for tasks assessed/performed             Past Medical History:  Diagnosis Date   Axillary mass, left    Cancer (HCC)    Complication of anesthesia    Diverticulosis    Hemorrhoids    Hyperlipidemia    Hypertension    IBS (irritable bowel syndrome)    Obesity    PONV (postoperative nausea and vomiting)    Pre-diabetes    Psoriasis    Shingles 02/19/2021   Past Surgical History:  Procedure Laterality Date   BREAST LUMPECTOMY WITH RADIOACTIVE SEED AND SENTINEL LYMPH NODE BIOPSY Bilateral 10/05/2019   Procedure: BILATERAL BREAST LUMPECTOMY WITH BILATERAL RADIOACTIVE SEEDS AND LEFT SENTINEL LYMPH NODE BIOPSY;  Surgeon: Abigail Miyamoto, MD;  Location: [facility];  Service: General;  Laterality: Bilateral;   BREAST SURGERY     CESAREAN SECTION     2 times   CHOLECYSTECTOMY     laproscopic   CHOLECYSTECTOMY, LAPAROSCOPIC     kidney stone removed  10/14/2009   MASS EXCISION Left 07/12/2019   Procedure: EXCISION LEFT AXILLARY MASS;  Surgeon: Abigail Miyamoto, MD;  Location: [facility];  Service: General;  Laterality: Left;   RE-EXCISION OF BREAST LUMPECTOMY Left 10/05/2019   Procedure: RE-EXCISION OF LEFT BREAST CANCER;  Surgeon: Abigail Miyamoto, MD;  Location: [facility];  Service: General;  Laterality: Left;   THYROGLOSSAL DUCT CYST  10/14/1998   TONSILLECTOMY  10/14/1977   TOTAL HIP ARTHROPLASTY Right 05/14/2022   Procedure: RIGHT TOTAL HIP ARTHROPLASTY ANTERIOR  APPROACH;  Surgeon: Kathryne Hitch, MD;  Location: [facility];  Service: Orthopedics;  Laterality: Right;   umbilical herniorrhapy     VAGINAL HYSTERECTOMY  10/14/2001   Patient Active Problem List   Diagnosis Date Noted   Venous insufficiency 01/29/2023   Status post total replacement of right hip 05/14/2022   Unilateral primary osteoarthritis, right hip 03/27/2022   Preop exam for internal medicine 01/16/2022   Insect bite, infected 02/19/2021   Shingles outbreak 02/19/2021   Dysuria 05/16/2020   Right knee pain 05/16/2020   Carcinoma of upper-outer quadrant of left breast in female, estrogen receptor positive (HCC) 08/25/2019   Bilateral breast cancer (HCC) 08/17/2019   Skin nodule 05/11/2019   B12 deficiency 05/11/2019   Vitamin D deficiency 05/11/2019   Acute lymphadenitis 07/16/2017   Eustachian tube dysfunction, bilateral 07/16/2017   Prolapse of female bladder, acquired 03/25/2017   Sudden right hearing loss 03/25/2017   Left otitis media 03/25/2017   Rash 11/07/2015   Abnormal mammogram 09/13/2015   Blister of toe of left foot 04/13/2015   Breast discharge 04/13/2015   Encounter for well adult exam with abnormal findings 08/06/2011   Microscopic hematuria 04/27/2010   ANXIETY 03/25/2009   PARESTHESIA 03/23/2009   PERIPHERAL EDEMA 03/23/2009   HEMORRHOIDS, INTERNAL 02/13/2008   DIVERTICULOSIS, COLON 02/13/2008   IBS 02/13/2008   CHOLECYSTECTOMY, LAPAROSCOPIC, HX OF  02/13/2008   OBESITY NOS 07/07/2007   Diabetes (HCC) 07/06/2007   Hyperlipidemia 07/06/2007   Essential hypertension 07/06/2007    PCP: Oliver Barre, MD  REFERRING PROVIDER: Serena Croissant, MD  REFERRING DIAG: C50.911,Z17.0,C50.912 (ICD-10-CM) - Bilateral malignant neoplasm of breast in female, estrogen receptor positive, unspecified site of breast (HCC)   THERAPY DIAG:  Lymphedema, not elsewhere classified  Stiffness of left shoulder, not elsewhere classified  Disorder of the skin and  subcutaneous tissue related to radiation, unspecified  Abnormal posture  Bilateral malignant neoplasm of breast in female, estrogen receptor positive, unspecified site of breast [facility])  ONSET DATE: 07/2022  Rationale for Evaluation and Treatment: Rehabilitation  SUBJECTIVE:                                                                                                                                                                                           SUBJECTIVE STATEMENT: I have a lot of fluid in the L breast. Around October of 2023 it started getting larger and by Thanksgiving it was more noticeable. My breast is heavy and uncomfortable.   PERTINENT HISTORY: Hx of bilateral breast cancer- 10/05/19 R lumpectomy - intraductal papilloma no malignacy, L lumpectomy - fibrocystic change intraductal pailloma no malignacy idencidinfed, L breast re excision IDC grade 2, DCIS ER/PR+, HER2- 0/6 nodes Ki-67- 15%, completed radiation 12/22/2019. R THA 05/14/2022  PAIN:  Are you having pain? No  PRECAUTIONS: None  RED FLAGS: None   WEIGHT BEARING RESTRICTIONS: No  FALLS:  Has patient fallen in last 6 months? No  LIVING ENVIRONMENT: Lives with: lives alone Lives in: House/apartment Stairs: No;  Has following equipment at home: Single point cane, Environmental consultant - 2 wheeled, Environmental consultant - 4 wheeled, Wheelchair (manual), shower chair, and bed side commode  OCCUPATION: retired  LEISURE: [facility] was [facility] [employer] prior to this  HAND DOMINANCE: right   PRIOR LEVEL OF FUNCTION: Independent  PATIENT GOALS: to get the breast to go down   OBJECTIVE:  COGNITION: Overall cognitive status: Within functional limits for tasks assessed   PALPATION: L breast very fibrotic   OBSERVATIONS / OTHER ASSESSMENTS: peau d'orange texture, about 30-50% larger than R side  POSTURE: forward head, rounded shoulders  UPPER EXTREMITY AROM/PROM:  A/PROM RIGHT   eval   Shoulder extension 78  Shoulder flexion 143   Shoulder abduction 167  Shoulder internal rotation 59  Shoulder external rotation 60    (Blank rows = not tested)  A/PROM LEFT   eval  Shoulder extension 75  Shoulder flexion 58  Shoulder abduction 64  Shoulder internal rotation Unable to assess   Shoulder external rotation  Unable to assess    (Blank rows = not tested)  CERVICAL AROM: All within normal limits:    Percent limited  Flexion   Extension   Right lateral flexion   Left lateral flexion   Right rotation   Left rotation      LYMPHEDEMA ASSESSMENTS:   LANDMARK RIGHT  eval  At axilla    15 cm proximal to olecranon process 33.5  10 cm proximal to olecranon process 32  Olecranon process 26.9  15 cm proximal to ulnar styloid process 24  10 cm proximal to ulnar styloid process 21  Just proximal to ulnar styloid process 17.2  Across hand at thumb web space 21.5  At base of 2nd digit 6.6  (Blank rows = not tested)  LANDMARK LEFT  eval  At axilla    15 cm proximal to olecranon process 35.4  10 cm proximal to olecranon process 31.8  Olecranon process 27  15 cm proximal to ulnar styloid process 23.9  10 cm proximal to ulnar styloid process 20.4  Just proximal to ulnar styloid process 17.4  Across hand at thumb web space 21.8  At base of 2nd digit 6.5  (Blank rows = not tested)      TODAY'S TREATMENT:                                                                                                                                          DATE:  05/21/23: created foam chip pack for pt to wear in her bra for additional compression, issued script for compression bra    PATIENT EDUCATION:  Education details: lymphedema, anatomy and physiology of the lymphatic system, how to manage breast lymphedema, compression bra Person educated: Patient Education method: Explanation Education comprehension: verbalized understanding  HOME EXERCISE PROGRAM: Wear chip pack as much as tolerable Obtain compression bra and  wear as much as possible  ASSESSMENT:  CLINICAL IMPRESSION: Patient is a 77 y.o. female who was seen today for physical therapy evaluation and treatment for L breast lymphedema and decreased L shoulder ROM. Pt reports she began developing lymphedema in her L breast in October of 2023 and it has been worsening since then. She does not have a compression bra so she was issued a script today to obtain one. Her L shoulder ROM is very limited with immediate scapular compensation. She would benefit from skilled PT services to decrease L breast lymphedema and improve L shoulder ROM to decrease risk of infection and improve function.     OBJECTIVE IMPAIRMENTS: decreased knowledge of condition, decreased knowledge of use of DME, decreased ROM, decreased strength, increased edema, increased fascial restrictions, impaired UE functional use, postural dysfunction, and pain.   ACTIVITY LIMITATIONS: carrying, lifting, sleeping, and reach over head  PARTICIPATION LIMITATIONS: cleaning, laundry, and community activity  PERSONAL FACTORS: Time since onset of injury/illness/exacerbation are also affecting patient's functional outcome.  REHAB POTENTIAL: Good  CLINICAL DECISION MAKING: Stable/uncomplicated  EVALUATION COMPLEXITY: Low  GOALS: Goals reviewed with patient? Yes  SHORT TERM GOALS=LONG TERM GOALS Target date: 06/18/23  Pt will be independent in self MLD for L breast for long term management of lymphedema. Baseline: Goal status: INITIAL  2.  Pt will obtain a compression bra for long term management of lymphedema. Baseline:  Goal status: INITIAL  3.  Pt will demonstrate 120 degrees of L shoulder flexion to allow imprved function. Baseline:  Goal status: INITIAL  4.  Pt will demonstrate 120 degrees of L shoulder abduction to allow improved function. Baseline:  Goal status: INITIAL  5.  Pt will be independent in a home exercise program for continued strengthening and stretching.   Baseline:  Goal status: INITIAL  6.  Pt will report a 50% improvement in heaviness of L breast to allow improved comfort.  Baseline:  Goal status: INITIAL  PLAN:  PT FREQUENCY: 2x/week  PT DURATION: 4 weeks  PLANNED INTERVENTIONS: Therapeutic exercises, Therapeutic activity, Patient/Family education, Self Care, Joint mobilization, Orthotic/Fit training, Manual lymph drainage, Compression bandaging, scar mobilization, Taping, Vasopneumatic device, Manual therapy, and Re-evaluation  PLAN FOR NEXT SESSION: being MLD to L breast and eventually instruct pt, did she get a compression bra?, PROM to L shoulder, scap mobs to L scap  [employer], PT 05/21/2023, 9:50 AM

## 2023-05-27 ENCOUNTER — Ambulatory Visit: Payer: Medicare Other

## 2023-05-27 DIAGNOSIS — I890 Lymphedema, not elsewhere classified: Secondary | ICD-10-CM

## 2023-05-27 DIAGNOSIS — M25612 Stiffness of left shoulder, not elsewhere classified: Secondary | ICD-10-CM

## 2023-05-27 DIAGNOSIS — R293 Abnormal posture: Secondary | ICD-10-CM

## 2023-05-27 DIAGNOSIS — C50911 Malignant neoplasm of unspecified site of right female breast: Secondary | ICD-10-CM

## 2023-05-27 DIAGNOSIS — L599 Disorder of the skin and subcutaneous tissue related to radiation, unspecified: Secondary | ICD-10-CM

## 2023-05-27 NOTE — Therapy (Signed)
OUTPATIENT PHYSICAL THERAPY  UPPER EXTREMITY ONCOLOGY TREATMMENT  Patient Name: Lean Loosli MRN: [id number] DOB:Apr 20, 1946, 77 y.o., female Today's Date: 05/27/2023  END OF SESSION:  PT End of Session - 05/27/23 0906     Visit Number 2    Number of Visits 9    Date for PT Re-Evaluation 06/18/23    PT Start Time 0907    PT Stop Time 0953    PT Time Calculation (min) 46 min    Activity Tolerance Patient tolerated treatment well    Behavior During Therapy [facility] for tasks assessed/performed             Past Medical History:  Diagnosis Date   Axillary mass, left    Cancer (HCC)    Complication of anesthesia    Diverticulosis    Hemorrhoids    Hyperlipidemia    Hypertension    IBS (irritable bowel syndrome)    Obesity    PONV (postoperative nausea and vomiting)    Pre-diabetes    Psoriasis    Shingles 02/19/2021   Past Surgical History:  Procedure Laterality Date   BREAST LUMPECTOMY WITH RADIOACTIVE SEED AND SENTINEL LYMPH NODE BIOPSY Bilateral 10/05/2019   Procedure: BILATERAL BREAST LUMPECTOMY WITH BILATERAL RADIOACTIVE SEEDS AND LEFT SENTINEL LYMPH NODE BIOPSY;  Surgeon: Abigail Miyamoto, MD;  Location: [facility];  Service: General;  Laterality: Bilateral;   BREAST SURGERY     CESAREAN SECTION     2 times   CHOLECYSTECTOMY     laproscopic   CHOLECYSTECTOMY, LAPAROSCOPIC     kidney stone removed  10/14/2009   MASS EXCISION Left 07/12/2019   Procedure: EXCISION LEFT AXILLARY MASS;  Surgeon: Abigail Miyamoto, MD;  Location: [facility];  Service: General;  Laterality: Left;   RE-EXCISION OF BREAST LUMPECTOMY Left 10/05/2019   Procedure: RE-EXCISION OF LEFT BREAST CANCER;  Surgeon: Abigail Miyamoto, MD;  [facility];  Service: General;  Laterality: Left;   THYROGLOSSAL DUCT CYST  10/14/1998   TONSILLECTOMY  10/14/1977   TOTAL HIP ARTHROPLASTY Right 05/14/2022   Procedure: RIGHT TOTAL HIP ARTHROPLASTY ANTERIOR  APPROACH;  Surgeon: Kathryne Hitch, MD;  Location: [facility];  Service: Orthopedics;  Laterality: Right;   umbilical herniorrhapy     VAGINAL HYSTERECTOMY  10/14/2001   Patient Active Problem List   Diagnosis Date Noted   Venous insufficiency 01/29/2023   Status post total replacement of right hip 05/14/2022   Unilateral primary osteoarthritis, right hip 03/27/2022   Preop exam for internal medicine 01/16/2022   Insect bite, infected 02/19/2021   Shingles outbreak 02/19/2021   Dysuria 05/16/2020   Right knee pain 05/16/2020   Carcinoma of upper-outer quadrant of left breast in female, estrogen receptor positive (HCC) 08/25/2019   Bilateral breast cancer (HCC) 08/17/2019   Skin nodule 05/11/2019   B12 deficiency 05/11/2019   Vitamin D deficiency 05/11/2019   Acute lymphadenitis 07/16/2017   Eustachian tube dysfunction, bilateral 07/16/2017   Prolapse of female bladder, acquired 03/25/2017   Sudden right hearing loss 03/25/2017   Left otitis media 03/25/2017   Rash 11/07/2015   Abnormal mammogram 09/13/2015   Blister of toe of left foot 04/13/2015   Breast discharge 04/13/2015   Encounter for well adult exam with abnormal findings 08/06/2011   Microscopic hematuria 04/27/2010   ANXIETY 03/25/2009   PARESTHESIA 03/23/2009   PERIPHERAL EDEMA 03/23/2009   HEMORRHOIDS, INTERNAL 02/13/2008   DIVERTICULOSIS, COLON 02/13/2008   IBS 02/13/2008   CHOLECYSTECTOMY, LAPAROSCOPIC, HX OF  02/13/2008   OBESITY NOS 07/07/2007   Diabetes (HCC) 07/06/2007   Hyperlipidemia 07/06/2007   Essential hypertension 07/06/2007    PCP: Oliver Barre, MD  REFERRING PROVIDER: Serena Croissant, MD  REFERRING DIAG: C50.911,Z17.0,C50.912 (ICD-10-CM) - Bilateral malignant neoplasm of breast in female, estrogen receptor positive, unspecified site of breast (HCC)   THERAPY DIAG:  Lymphedema, not elsewhere classified  Stiffness of left shoulder, not elsewhere classified  Disorder of the skin and  subcutaneous tissue related to radiation, unspecified  Abnormal posture  Bilateral malignant neoplasm of breast in female, estrogen receptor positive, unspecified site of breast (HCC)  Malignant neoplasm of right female breast, unspecified estrogen receptor status, unspecified site of breast [facility])  ONSET DATE: 07/2022  Rationale for Evaluation and Treatment: Rehabilitation  SUBJECTIVE:                                                                                                                                                                                           SUBJECTIVE STATEMENT:  I go get fit for a compression bra today. I have pulleys at home so I use them at times  PERTINENT HISTORY: Hx of bilateral breast cancer- 10/05/19 R lumpectomy - intraductal papilloma no malignacy, L lumpectomy - fibrocystic change intraductal pailloma no malignacy idencidinfed, L breast re excision IDC grade 2, DCIS ER/PR+, HER2- 0/6 nodes Ki-67- 15%, completed radiation 12/22/2019. R THA 05/14/2022  PAIN:  Are you having pain? No  PRECAUTIONS: None  RED FLAGS: None   WEIGHT BEARING RESTRICTIONS: No  FALLS:  Has patient fallen in last 6 months? No  LIVING ENVIRONMENT: Lives with: lives alone Lives in: House/apartment Stairs: No;  Has following equipment at home: Single point cane, Environmental consultant - 2 wheeled, Environmental consultant - 4 wheeled, Wheelchair (manual), shower chair, and bed side commode  OCCUPATION: retired  LEISURE: pt was going to [employer] prior to this  HAND DOMINANCE: right   PRIOR LEVEL OF FUNCTION: Independent  PATIENT GOALS: to get the breast to go down   OBJECTIVE:  COGNITION: Overall cognitive status: Within functional limits for tasks assessed   PALPATION: L breast very fibrotic   OBSERVATIONS / OTHER ASSESSMENTS: peau d'orange texture, about 30-50% larger than R side  POSTURE: forward head, rounded shoulders  UPPER EXTREMITY AROM/PROM:  A/PROM RIGHT   eval   Shoulder  extension 78  Shoulder flexion 143  Shoulder abduction 167  Shoulder internal rotation 59  Shoulder external rotation 60    (Blank rows = not tested)  A/PROM LEFT   eval  Shoulder extension 75  Shoulder flexion 58  Shoulder abduction 64  Shoulder internal rotation Unable to assess  Shoulder external rotation Unable to assess    (Blank rows = not tested)  CERVICAL AROM: All within normal limits:    Percent limited  Flexion   Extension   Right lateral flexion   Left lateral flexion   Right rotation   Left rotation      LYMPHEDEMA ASSESSMENTS:   LANDMARK RIGHT  eval  At axilla    15 cm proximal to olecranon process 33.5  10 cm proximal to olecranon process 32  Olecranon process 26.9  15 cm proximal to ulnar styloid process 24  10 cm proximal to ulnar styloid process 21  Just proximal to ulnar styloid process 17.2  Across hand at thumb web space 21.5  At base of 2nd digit 6.6  (Blank rows = not tested)  LANDMARK LEFT  eval  At axilla    15 cm proximal to olecranon process 35.4  10 cm proximal to olecranon process 31.8  Olecranon process 27  15 cm proximal to ulnar styloid process 23.9  10 cm proximal to ulnar styloid process 20.4  Just proximal to ulnar styloid process 17.4  Across hand at thumb web space 21.8  At base of 2nd digit 6.5  (Blank rows = not tested)      TODAY'S TREATMENT:                                                                                                                                          DATE:  Pt permission and consent throughout each step of examination and treatment with modification and draping if requested when working on sensitive areas  05/27/2023 Educated pt about role of lymphatics, importance of stretch, activation of LN's etc and discussed techniques while performing In supine: Short neck, 5 diaphragmatic breaths, R axillary nodes and establishment of interaxillary pathway, L inguinal nodes and establishment  of axilloinguinal pathway, then L breast moving fluid towards pathways spending extra time in any areas of fibrosis then retracing all steps.    05/21/23: created foam chip pack for pt to wear in her bra for additional compression, issued script for compression bra    PATIENT EDUCATION:  Education details: lymphedema, anatomy and physiology of the lymphatic system, how to manage breast lymphedema, compression bra Person educated: Patient Education method: Explanation Education comprehension: verbalized understanding  HOME EXERCISE PROGRAM: Wear chip pack as much as tolerable Obtain compression bra and wear as much as possible  ASSESSMENT:  CLINICAL IMPRESSION:  Initiated left breast MLD with pt permission and started educating pt in lymphatics, gentle nature of MLD with stretch and not slide etc. Pt has appt today to be fit for compression bra. She is wearing chip pack in her sports bra, but no real indentations noted from chip pack. Very fibrotic medial and lateral lower breast.   OBJECTIVE IMPAIRMENTS: decreased knowledge of condition, decreased knowledge of use of DME, decreased ROM, decreased strength, increased  edema, increased fascial restrictions, impaired UE functional use, postural dysfunction, and pain.   ACTIVITY LIMITATIONS: carrying, lifting, sleeping, and reach over head  PARTICIPATION LIMITATIONS: cleaning, laundry, and community activity  PERSONAL FACTORS: Time since onset of injury/illness/exacerbation are also affecting patient's functional outcome.   REHAB POTENTIAL: Good  CLINICAL DECISION MAKING: Stable/uncomplicated  EVALUATION COMPLEXITY: Low  GOALS: Goals reviewed with patient? Yes  SHORT TERM GOALS=LONG TERM GOALS Target date: 06/18/23  Pt will be independent in self MLD for L breast for long term management of lymphedema. Baseline: Goal status: INITIAL  2.  Pt will obtain a compression bra for long term management of lymphedema. Baseline:  Goal  status: INITIAL  3.  Pt will demonstrate 120 degrees of L shoulder flexion to allow imprved function. Baseline:  Goal status: INITIAL  4.  Pt will demonstrate 120 degrees of L shoulder abduction to allow improved function. Baseline:  Goal status: INITIAL  5.  Pt will be independent in a home exercise program for continued strengthening and stretching.  Baseline:  Goal status: INITIAL  6.  Pt will report a 50% improvement in heaviness of L breast to allow improved comfort.  Baseline:  Goal status: INITIAL  PLAN:  PT FREQUENCY: 2x/week  PT DURATION: 4 weeks  PLANNED INTERVENTIONS: Therapeutic exercises, Therapeutic activity, Patient/Family education, Self Care, Joint mobilization, Orthotic/Fit training, Manual lymph drainage, Compression bandaging, scar mobilization, Taping, Vasopneumatic device, Manual therapy, and Re-evaluation  PLAN FOR NEXT SESSION: being MLD to L breast and eventually instruct pt, did she get a compression bra?, PROM to L shoulder, scap mobs to L scap  Waynette Buttery, PT 05/27/2023, 9:54 AM

## 2023-05-28 ENCOUNTER — Encounter: Payer: Self-pay | Admitting: Orthopaedic Surgery

## 2023-05-28 ENCOUNTER — Other Ambulatory Visit (INDEPENDENT_AMBULATORY_CARE_PROVIDER_SITE_OTHER): Payer: Medicare Other

## 2023-05-28 ENCOUNTER — Ambulatory Visit (INDEPENDENT_AMBULATORY_CARE_PROVIDER_SITE_OTHER): Payer: Medicare Other | Admitting: Orthopaedic Surgery

## 2023-05-28 DIAGNOSIS — Z96641 Presence of right artificial hip joint: Secondary | ICD-10-CM

## 2023-05-28 NOTE — Progress Notes (Signed)
The patient is a 77 year old female who is now 1 year out from a right total hip arthroplasty.  She says she has good range of motion and strength and is doing much more on her own than before surgery.  She is very satisfied.  Examination of her right hip shows it moves smoothly and fluidly.  Her left hip also moves smoothly and fluidly.  Of note she can perform her activities of daily living much easier now than from before.  An AP pelvis and lateral the right hip shows a well-seated total hip arthroplasty with no complicating features.  Her left hip joint space is well-maintained.  This point follow-up for her right hip can be as needed.  I let her know about the things that we need to bring her back for that hip or other orthopedic issues.  All questions and concerns were addressed and answered.

## 2023-06-02 ENCOUNTER — Ambulatory Visit: Payer: Medicare Other | Admitting: Physical Therapy

## 2023-06-02 ENCOUNTER — Encounter: Payer: Self-pay | Admitting: Physical Therapy

## 2023-06-02 DIAGNOSIS — R293 Abnormal posture: Secondary | ICD-10-CM

## 2023-06-02 DIAGNOSIS — M25612 Stiffness of left shoulder, not elsewhere classified: Secondary | ICD-10-CM

## 2023-06-02 DIAGNOSIS — C50911 Malignant neoplasm of unspecified site of right female breast: Secondary | ICD-10-CM

## 2023-06-02 DIAGNOSIS — I890 Lymphedema, not elsewhere classified: Secondary | ICD-10-CM

## 2023-06-02 DIAGNOSIS — L599 Disorder of the skin and subcutaneous tissue related to radiation, unspecified: Secondary | ICD-10-CM

## 2023-06-02 NOTE — Therapy (Signed)
OUTPATIENT PHYSICAL THERAPY  UPPER EXTREMITY ONCOLOGY TREATMMENT  Patient Name: Connie Marshall MRN: [id number] DOB:[birth year], 77 y.o., female Today's Date: 06/02/2023  END OF SESSION:  PT End of Session - 06/02/23 0806     Visit Number 3    Number of Visits 9    Date for PT Re-Evaluation 06/18/23    PT Start Time 0804    PT Stop Time 0857    PT Time Calculation (min) 53 min    Activity Tolerance Patient tolerated treatment well    Behavior During Therapy WFL for tasks assessed/performed             Past Medical History:  Diagnosis Date   Axillary mass, left    Cancer (HCC)    Complication of anesthesia    Diverticulosis    Hemorrhoids    Hyperlipidemia    Hypertension    IBS (irritable bowel syndrome)    Obesity    PONV (postoperative nausea and vomiting)    Pre-diabetes    Psoriasis    Shingles 02/19/2021   Past Surgical History:  Procedure Laterality Date   BREAST LUMPECTOMY WITH RADIOACTIVE SEED AND SENTINEL LYMPH NODE BIOPSY Bilateral 10/05/2019   Procedure: BILATERAL BREAST LUMPECTOMY WITH BILATERAL RADIOACTIVE SEEDS AND LEFT SENTINEL LYMPH NODE BIOPSY;  Surgeon: Abigail Miyamoto, MD;  Location: [facility];  Service: General;  Laterality: Bilateral;   BREAST SURGERY     CESAREAN SECTION     2 times   CHOLECYSTECTOMY     laproscopic   CHOLECYSTECTOMY, LAPAROSCOPIC     kidney stone removed  10/14/2009   MASS EXCISION Left 07/12/2019   Procedure: EXCISION LEFT AXILLARY MASS;  Surgeon: Abigail Miyamoto, MD;  Location: [facility];  Service: General;  Laterality: Left;   RE-EXCISION OF BREAST LUMPECTOMY Left 10/05/2019   Procedure: RE-EXCISION OF LEFT BREAST CANCER;  Surgeon: Abigail Miyamoto, MD;  Location: [facility];  Service: General;  Laterality: Left;   THYROGLOSSAL DUCT CYST  10/14/1998   TONSILLECTOMY  10/14/1977   TOTAL HIP ARTHROPLASTY Right 05/14/2022   Procedure: RIGHT TOTAL HIP ARTHROPLASTY ANTERIOR  APPROACH;  Surgeon: Kathryne Hitch, MD;  Location: [facility];  Service: Orthopedics;  Laterality: Right;   umbilical herniorrhapy     VAGINAL HYSTERECTOMY  10/14/2001   Patient Active Problem List   Diagnosis Date Noted   Venous insufficiency 01/29/2023   Status post total replacement of right hip 05/14/2022   Unilateral primary osteoarthritis, right hip 03/27/2022   Preop exam for internal medicine 01/16/2022   Insect bite, infected 02/19/2021   Shingles outbreak 02/19/2021   Dysuria 05/16/2020   Right knee pain 05/16/2020   Carcinoma of upper-outer quadrant of left breast in female, estrogen receptor positive (HCC) 08/25/2019   Bilateral breast cancer (HCC) 08/17/2019   Skin nodule 05/11/2019   B12 deficiency 05/11/2019   Vitamin D deficiency 05/11/2019   Acute lymphadenitis 07/16/2017   Eustachian tube dysfunction, bilateral 07/16/2017   Prolapse of female bladder, acquired 03/25/2017   Sudden right hearing loss 03/25/2017   Left otitis media 03/25/2017   Rash 11/07/2015   Abnormal mammogram 09/13/2015   Blister of toe of left foot 04/13/2015   Breast discharge 04/13/2015   Encounter for well adult exam with abnormal findings 08/06/2011   Microscopic hematuria 04/27/2010   ANXIETY 03/25/2009   PARESTHESIA 03/23/2009   PERIPHERAL EDEMA 03/23/2009   HEMORRHOIDS, INTERNAL 02/13/2008   DIVERTICULOSIS, COLON 02/13/2008   IBS 02/13/2008   CHOLECYSTECTOMY, LAPAROSCOPIC, HX OF  02/13/2008   OBESITY NOS 07/07/2007   Diabetes (HCC) 07/06/2007   Hyperlipidemia 07/06/2007   Essential hypertension 07/06/2007    PCP: Oliver Barre, MD  REFERRING PROVIDER: Serena Croissant, MD  REFERRING DIAG: C50.911,Z17.0,C50.912 (ICD-10-CM) - Bilateral malignant neoplasm of breast in female, estrogen receptor positive, unspecified site of breast (HCC)   THERAPY DIAG:  Lymphedema, not elsewhere classified  Stiffness of left shoulder, not elsewhere classified  Disorder of the skin and  subcutaneous tissue related to radiation, unspecified  Abnormal posture  Bilateral malignant neoplasm of breast in female, estrogen receptor positive, unspecified site of breast [facility])  ONSET DATE: 07/2022  Rationale for Evaluation and Treatment: Rehabilitation  SUBJECTIVE:                                                                                                                                                                                           SUBJECTIVE STATEMENT:  My breast feels so heavy. I got the compression bra but it feels so tight.   PERTINENT HISTORY: Hx of bilateral breast cancer- 10/05/19 R lumpectomy - intraductal papilloma no malignacy, L lumpectomy - fibrocystic change intraductal pailloma no malignacy idencidinfed, L breast re excision IDC grade 2, DCIS ER/PR+, HER2- 0/6 nodes Ki-67- 15%, completed radiation 12/22/2019. R THA 05/14/2022  PAIN:  Are you having pain? Yes 3/10, heaviness in L breast, nothing makes the pain better or worse  PRECAUTIONS: None  RED FLAGS: None   WEIGHT BEARING RESTRICTIONS: No  FALLS:  Has patient fallen in last 6 months? No  LIVING ENVIRONMENT: Lives with: lives alone Lives in: House/apartment Stairs: No;  Has following equipment at home: Single point cane, Environmental consultant - 2 wheeled, Environmental consultant - 4 wheeled, Wheelchair (manual), shower chair, and bed side commode  OCCUPATION: retired  LEISURE: [facility] [employer] prior to this  HAND DOMINANCE: right   PRIOR LEVEL OF FUNCTION: Independent  PATIENT GOALS: to get the breast to go down   OBJECTIVE:  COGNITION: Overall cognitive status: Within functional limits for tasks assessed   PALPATION: L breast very fibrotic   OBSERVATIONS / OTHER ASSESSMENTS: peau d'orange texture, about 30-50% larger than R side  POSTURE: forward head, rounded shoulders  UPPER EXTREMITY AROM/PROM:  A/PROM RIGHT   eval   Shoulder extension 78  Shoulder flexion 143  Shoulder abduction 167   Shoulder internal rotation 59  Shoulder external rotation 60    (Blank rows = not tested)  A/PROM LEFT   eval  Shoulder extension 75  Shoulder flexion 58  Shoulder abduction 64  Shoulder internal rotation Unable to assess   Shoulder external rotation Unable to assess    (  Blank rows = not tested)  CERVICAL AROM: All within normal limits:    Percent limited  Flexion   Extension   Right lateral flexion   Left lateral flexion   Right rotation   Left rotation      LYMPHEDEMA ASSESSMENTS:   LANDMARK RIGHT  eval  At axilla    15 cm proximal to olecranon process 33.5  10 cm proximal to olecranon process 32  Olecranon process 26.9  15 cm proximal to ulnar styloid process 24  10 cm proximal to ulnar styloid process 21  Just proximal to ulnar styloid process 17.2  Across hand at thumb web space 21.5  At base of 2nd digit 6.6  (Blank rows = not tested)  LANDMARK LEFT  eval  At axilla    15 cm proximal to olecranon process 35.4  10 cm proximal to olecranon process 31.8  Olecranon process 27  15 cm proximal to ulnar styloid process 23.9  10 cm proximal to ulnar styloid process 20.4  Just proximal to ulnar styloid process 17.4  Across hand at thumb web space 21.8  At base of 2nd digit 6.5  (Blank rows = not tested)      TODAY'S TREATMENT:                                                                                                                                          DATE:  Pt permission and consent throughout each step of examination and treatment with modification and draping if requested when working on sensitive areas  06/02/2023 Educated pt about role of lymphatics, importance of stretch, activation of LN's etc and discussed techniques while performing In supine: Short neck, 5 diaphragmatic breaths, R axillary nodes and establishment of interaxillary pathway, L inguinal nodes and establishment of axilloinguinal pathway, then L breast moving fluid towards  pathways spending extra time in any areas of fibrosis then retracing all steps.  05/27/2023 Educated pt about role of lymphatics, importance of stretch, activation of LN's etc and discussed techniques while performing In supine: Short neck, 5 diaphragmatic breaths, R axillary nodes and establishment of interaxillary pathway, L inguinal nodes and establishment of axilloinguinal pathway, then L breast moving fluid towards pathways spending extra time in any areas of fibrosis then retracing all steps.    05/21/23: created foam chip pack for pt to wear in her bra for additional compression, issued script for compression bra    PATIENT EDUCATION:  Education details: lymphedema, anatomy and physiology of the lymphatic system, how to manage breast lymphedema, compression bra Person educated: Patient Education method: Explanation Education comprehension: verbalized understanding  HOME EXERCISE PROGRAM: Wear chip pack as much as tolerable Obtain compression bra and wear as much as possible  ASSESSMENT:  CLINICAL IMPRESSION: Continued with MLD to L breast today. There was increased fibrosis in inferior breast so spent increased time in this area. Significant softening of  lymphedema noted by end of session and pt reported increased comfort. Encouraged pt to wear her compression bra when she gets home.   OBJECTIVE IMPAIRMENTS: decreased knowledge of condition, decreased knowledge of use of DME, decreased ROM, decreased strength, increased edema, increased fascial restrictions, impaired UE functional use, postural dysfunction, and pain.   ACTIVITY LIMITATIONS: carrying, lifting, sleeping, and reach over head  PARTICIPATION LIMITATIONS: cleaning, laundry, and community activity  PERSONAL FACTORS: Time since onset of injury/illness/exacerbation are also affecting patient's functional outcome.   REHAB POTENTIAL: Good  CLINICAL DECISION MAKING: Stable/uncomplicated  EVALUATION COMPLEXITY:  Low  GOALS: Goals reviewed with patient? Yes  SHORT TERM GOALS=LONG TERM GOALS Target date: 06/18/23  Pt will be independent in self MLD for L breast for long term management of lymphedema. Baseline: Goal status: INITIAL  2.  Pt will obtain a compression bra for long term management of lymphedema. Baseline:  Goal status: INITIAL  3.  Pt will demonstrate 120 degrees of L shoulder flexion to allow imprved function. Baseline:  Goal status: INITIAL  4.  Pt will demonstrate 120 degrees of L shoulder abduction to allow improved function. Baseline:  Goal status: INITIAL  5.  Pt will be independent in a home exercise program for continued strengthening and stretching.  Baseline:  Goal status: INITIAL  6.  Pt will report a 50% improvement in heaviness of L breast to allow improved comfort.  Baseline:  Goal status: INITIAL  PLAN:  PT FREQUENCY: 2x/week  PT DURATION: 4 weeks  PLANNED INTERVENTIONS: Therapeutic exercises, Therapeutic activity, Patient/Family education, Self Care, Joint mobilization, Orthotic/Fit training, Manual lymph drainage, Compression bandaging, scar mobilization, Taping, Vasopneumatic device, Manual therapy, and Re-evaluation  PLAN FOR NEXT SESSION: cont MLD to L breast and eventually instruct pt, did she get a compression bra?, PROM to L shoulder, scap mobs to L scap  [employer], PT 06/02/2023, 8:59 AM

## 2023-06-04 ENCOUNTER — Encounter: Payer: Self-pay | Admitting: Physical Therapy

## 2023-06-04 ENCOUNTER — Ambulatory Visit: Payer: Medicare Other | Admitting: Physical Therapy

## 2023-06-04 DIAGNOSIS — M25612 Stiffness of left shoulder, not elsewhere classified: Secondary | ICD-10-CM

## 2023-06-04 DIAGNOSIS — I890 Lymphedema, not elsewhere classified: Secondary | ICD-10-CM

## 2023-06-04 DIAGNOSIS — L599 Disorder of the skin and subcutaneous tissue related to radiation, unspecified: Secondary | ICD-10-CM

## 2023-06-04 DIAGNOSIS — C50911 Malignant neoplasm of unspecified site of right female breast: Secondary | ICD-10-CM

## 2023-06-04 DIAGNOSIS — R293 Abnormal posture: Secondary | ICD-10-CM

## 2023-06-04 NOTE — Patient Instructions (Signed)
Self manual lymph drainage: Perform this sequence once a day.  Only give enough pressure no your skin to make the skin move.  Diaphragmatic - Supine   Inhale through nose making navel move out toward hands. Exhale through puckered lips, hands follow navel in. Repeat _5__ times. Rest _10__ seconds between repeats.   Copyright  VHI. All rights reserved.  Hug yourself.  Do circles at your neck just above your collarbones.  Repeat this 10 times.  Axilla - One at a Time   Using full weight of flat hand and fingers at center of uninvolved armpit, make _10__ in-place circles.   Copyright  VHI. All rights reserved.  LEG: Inguinal Nodes Stimulation   With small finger side of hand against hip crease on involved side, gently perform circles at the crease. Repeat __10_ times.   Copyright  VHI. All rights reserved.  Axilla to Inguinal Nodes - Sweep   On involved side, stretch skin _4__ times from armpit along side of trunk to hip crease.  Now gently stretch skin from the involved side to the uninvolved side across the chest at the shoulder line.  Repeat that 4 times.  Draw an imaginary diagonal line from upper outer breast through the nipple area toward lower inner breast.  Direct fluid upward and inward from this line toward the pathway across your upper chest .  Do this in three rows to treat all of the upper inner breast tissue, and do each row 3-4x.      Direct fluid to treat all of lower outer breast tissue downward and outward toward      pathway that is aimed at the left groin.  Finish by doing the pathways as described above going from your involved armpit to the same side groin and going across your upper chest from the involved shoulder to the uninvolved shoulder.  Repeat the steps above where you do circles in your left groin and right armpit. Copyright  VHI. All rights reserved.   

## 2023-06-04 NOTE — Therapy (Addendum)
OUTPATIENT PHYSICAL THERAPY  UPPER EXTREMITY ONCOLOGY TREATMMENT  Patient Name: Connie Marshall MRN: [id number] DOB:[birth year], 77 y.o., female Today's Date: 06/04/2023  END OF SESSION:  PT End of Session - 06/04/23 0805     Visit Number 4    Number of Visits 9    Date for PT Re-Evaluation 06/18/23    PT Start Time 0804    PT Stop Time 0855    PT Time Calculation (min) 51 min    Activity Tolerance Patient tolerated treatment well    Behavior During Therapy WFL for tasks assessed/performed             Past Medical History:  Diagnosis Date   Axillary mass, left    Cancer (HCC)    Complication of anesthesia    Diverticulosis    Hemorrhoids    Hyperlipidemia    Hypertension    IBS (irritable bowel syndrome)    Obesity    PONV (postoperative nausea and vomiting)    Pre-diabetes    Psoriasis    Shingles 02/19/2021   Past Surgical History:  Procedure Laterality Date   BREAST LUMPECTOMY WITH RADIOACTIVE SEED AND SENTINEL LYMPH NODE BIOPSY Bilateral 10/05/2019   Procedure: BILATERAL BREAST LUMPECTOMY WITH BILATERAL RADIOACTIVE SEEDS AND LEFT SENTINEL LYMPH NODE BIOPSY;  Surgeon: Abigail Miyamoto, MD;  Location: [facility];  Service: General;  Laterality: Bilateral;   BREAST SURGERY     CESAREAN SECTION     2 times   CHOLECYSTECTOMY     laproscopic   CHOLECYSTECTOMY, LAPAROSCOPIC     kidney stone removed  10/14/2009   MASS EXCISION Left 07/12/2019   Procedure: EXCISION LEFT AXILLARY MASS;  Surgeon: Abigail Miyamoto, MD;  Location: [facility];  Service: General;  Laterality: Left;   RE-EXCISION OF BREAST LUMPECTOMY Left 10/05/2019   Procedure: RE-EXCISION OF LEFT BREAST CANCER;  Surgeon: Abigail Miyamoto, MD;  Location: [facility];  Service: General;  Laterality: Left;   THYROGLOSSAL DUCT CYST  10/14/1998   TONSILLECTOMY  10/14/1977   TOTAL HIP ARTHROPLASTY Right 05/14/2022   Procedure: RIGHT TOTAL HIP ARTHROPLASTY ANTERIOR  APPROACH;  Surgeon: Kathryne Hitch, MD;  Location: [facility];  Service: Orthopedics;  Laterality: Right;   umbilical herniorrhapy     VAGINAL HYSTERECTOMY  10/14/2001   Patient Active Problem List   Diagnosis Date Noted   Venous insufficiency 01/29/2023   Status post total replacement of right hip 05/14/2022   Unilateral primary osteoarthritis, right hip 03/27/2022   Preop exam for internal medicine 01/16/2022   Insect bite, infected 02/19/2021   Shingles outbreak 02/19/2021   Dysuria 05/16/2020   Right knee pain 05/16/2020   Carcinoma of upper-outer quadrant of left breast in female, estrogen receptor positive (HCC) 08/25/2019   Bilateral breast cancer (HCC) 08/17/2019   Skin nodule 05/11/2019   B12 deficiency 05/11/2019   Vitamin D deficiency 05/11/2019   Acute lymphadenitis 07/16/2017   Eustachian tube dysfunction, bilateral 07/16/2017   Prolapse of female bladder, acquired 03/25/2017   Sudden right hearing loss 03/25/2017   Left otitis media 03/25/2017   Rash 11/07/2015   Abnormal mammogram 09/13/2015   Blister of toe of left foot 04/13/2015   Breast discharge 04/13/2015   Encounter for well adult exam with abnormal findings 08/06/2011   Microscopic hematuria 04/27/2010   ANXIETY 03/25/2009   PARESTHESIA 03/23/2009   PERIPHERAL EDEMA 03/23/2009   HEMORRHOIDS, INTERNAL 02/13/2008   DIVERTICULOSIS, COLON 02/13/2008   IBS 02/13/2008   CHOLECYSTECTOMY, LAPAROSCOPIC, HX OF  02/13/2008   OBESITY NOS 07/07/2007   Diabetes (HCC) 07/06/2007   Hyperlipidemia 07/06/2007   Essential hypertension 07/06/2007    PCP: Oliver Barre, MD  REFERRING PROVIDER: Serena Croissant, MD  REFERRING DIAG: C50.911,Z17.0,C50.912 (ICD-10-CM) - Bilateral malignant neoplasm of breast in female, estrogen receptor positive, unspecified site of breast (HCC)   THERAPY DIAG:  Lymphedema, not elsewhere classified  Stiffness of left shoulder, not elsewhere classified  Disorder of the skin and  subcutaneous tissue related to radiation, unspecified  Abnormal posture  Bilateral malignant neoplasm of breast in female, estrogen receptor positive, unspecified site of breast [facility])  ONSET DATE: 07/2022  Rationale for Evaluation and Treatment: Rehabilitation  SUBJECTIVE:                                                                                                                                                                                           SUBJECTIVE STATEMENT:  The fluid is still settling down in the middle.   PERTINENT HISTORY: Hx of bilateral breast cancer- 10/05/19 R lumpectomy - intraductal papilloma no malignacy, L lumpectomy - fibrocystic change intraductal pailloma no malignacy idencidinfed, L breast re excision IDC grade 2, DCIS ER/PR+, HER2- 0/6 nodes Ki-67- 15%, completed radiation 12/22/2019. R THA 05/14/2022  PAIN:  Are you having pain? No   PRECAUTIONS: None  RED FLAGS: None   WEIGHT BEARING RESTRICTIONS: No  FALLS:  Has patient fallen in last 6 months? No  LIVING ENVIRONMENT: Lives with: lives alone Lives in: House/apartment Stairs: No;  Has following equipment at home: Single point cane, Environmental consultant - 2 wheeled, Environmental consultant - 4 wheeled, Wheelchair (manual), shower chair, and bed side commode  OCCUPATION: retired  LEISURE: [facility] [employer] prior to this  HAND DOMINANCE: right   PRIOR LEVEL OF FUNCTION: Independent  PATIENT GOALS: to get the breast to go down   OBJECTIVE:  COGNITION: Overall cognitive status: Within functional limits for tasks assessed   PALPATION: L breast very fibrotic   OBSERVATIONS / OTHER ASSESSMENTS: peau d'orange texture, about 30-50% larger than R side  POSTURE: forward head, rounded shoulders  UPPER EXTREMITY AROM/PROM:  A/PROM RIGHT   eval   Shoulder extension 78  Shoulder flexion 143  Shoulder abduction 167  Shoulder internal rotation 59  Shoulder external rotation 60    (Blank rows = not  tested)  A/PROM LEFT   eval  Shoulder extension 75  Shoulder flexion 58  Shoulder abduction 64  Shoulder internal rotation Unable to assess   Shoulder external rotation Unable to assess    (Blank rows = not tested)  CERVICAL AROM: All within normal limits:  Percent limited  Flexion   Extension   Right lateral flexion   Left lateral flexion   Right rotation   Left rotation      LYMPHEDEMA ASSESSMENTS:   LANDMARK RIGHT  eval  At axilla    15 cm proximal to olecranon process 33.5  10 cm proximal to olecranon process 32  Olecranon process 26.9  15 cm proximal to ulnar styloid process 24  10 cm proximal to ulnar styloid process 21  Just proximal to ulnar styloid process 17.2  Across hand at thumb web space 21.5  At base of 2nd digit 6.6  (Blank rows = not tested)  LANDMARK LEFT  eval  At axilla    15 cm proximal to olecranon process 35.4  10 cm proximal to olecranon process 31.8  Olecranon process 27  15 cm proximal to ulnar styloid process 23.9  10 cm proximal to ulnar styloid process 20.4  Just proximal to ulnar styloid process 17.4  Across hand at thumb web space 21.8  At base of 2nd digit 6.5  (Blank rows = not tested)      TODAY'S TREATMENT:                                                                                                                                          DATE:  Pt permission and consent throughout each step of examination and treatment with modification and draping if requested when working on sensitive areas  06/04/23: Instructed pt in self MLD today and had her return demonstrate each step with pt practicing for the first part of the session and therapist completing session as follows: In supine: Short neck, 5 diaphragmatic breaths, R axillary nodes and establishment of interaxillary pathway, L inguinal nodes and establishment of axilloinguinal pathway, then L breast moving fluid towards pathways spending extra time in any areas  of fibrosis then retracing all steps.   06/02/2023 Educated pt about role of lymphatics, importance of stretch, activation of LN's etc and discussed techniques while performing In supine: Short neck, 5 diaphragmatic breaths, R axillary nodes and establishment of interaxillary pathway, L inguinal nodes and establishment of axilloinguinal pathway, then L breast moving fluid towards pathways spending extra time in any areas of fibrosis then retracing all steps.  05/27/2023 Educated pt about role of lymphatics, importance of stretch, activation of LN's etc and discussed techniques while performing In supine: Short neck, 5 diaphragmatic breaths, R axillary nodes and establishment of interaxillary pathway, L inguinal nodes and establishment of axilloinguinal pathway, then L breast moving fluid towards pathways spending extra time in any areas of fibrosis then retracing all steps.    05/21/23: created foam chip pack for pt to wear in her bra for additional compression, issued script for compression bra    PATIENT EDUCATION:  Education details: lymphedema, anatomy and physiology of the lymphatic system, how to manage breast lymphedema, compression bra  Person educated: Patient Education method: Explanation Education comprehension: verbalized understanding  HOME EXERCISE PROGRAM: Wear chip pack as much as tolerable Obtain compression bra and wear as much as possible Self MLD to R breast  ASSESSMENT:  CLINICAL IMPRESSION: Instructed pt in entire MLD sequence for R breast. Had her return demonstrate each step while therapist provided v/c and t/c. Pt demonstrated good skin stretch by end of session. She has some difficulty with lateral breast due to decreased motion and increased pain in L shoulder. Encouraged pt to practice at home and wear her compression bra as much as possible.   OBJECTIVE IMPAIRMENTS: decreased knowledge of condition, decreased knowledge of use of DME, decreased ROM, decreased  strength, increased edema, increased fascial restrictions, impaired UE functional use, postural dysfunction, and pain.   ACTIVITY LIMITATIONS: carrying, lifting, sleeping, and reach over head  PARTICIPATION LIMITATIONS: cleaning, laundry, and community activity  PERSONAL FACTORS: Time since onset of injury/illness/exacerbation are also affecting patient's functional outcome.   REHAB POTENTIAL: Good  CLINICAL DECISION MAKING: Stable/uncomplicated  EVALUATION COMPLEXITY: Low  GOALS: Goals reviewed with patient? Yes  SHORT TERM GOALS=LONG TERM GOALS Target date: 06/18/23  Pt will be independent in self MLD for L breast for long term management of lymphedema. Baseline: Goal status: INITIAL  2.  Pt will obtain a compression bra for long term management of lymphedema. Baseline:  Goal status: INITIAL  3.  Pt will demonstrate 120 degrees of L shoulder flexion to allow imprved function. Baseline:  Goal status: INITIAL  4.  Pt will demonstrate 120 degrees of L shoulder abduction to allow improved function. Baseline:  Goal status: INITIAL  5.  Pt will be independent in a home exercise program for continued strengthening and stretching.  Baseline:  Goal status: INITIAL  6.  Pt will report a 50% improvement in heaviness of L breast to allow improved comfort.  Baseline:  Goal status: INITIAL  PLAN:  PT FREQUENCY: 2x/week  PT DURATION: 4 weeks  PLANNED INTERVENTIONS: Therapeutic exercises, Therapeutic activity, Patient/Family education, Self Care, Joint mobilization, Orthotic/Fit training, Manual lymph drainage, Compression bandaging, scar mobilization, Taping, Vasopneumatic device, Manual therapy, and Re-evaluation  PLAN FOR NEXT SESSION: cont MLD to L breast and eventually instruct pt, did she get a compression bra?, PROM to L shoulder, scap mobs to L scap  [employer], PT 06/04/2023, 9:00 AM

## 2023-06-09 ENCOUNTER — Ambulatory Visit: Payer: Medicare Other

## 2023-06-09 VITALS — Ht 67.0 in | Wt 215.0 lb

## 2023-06-09 DIAGNOSIS — Z0000 Encounter for general adult medical examination without abnormal findings: Secondary | ICD-10-CM

## 2023-06-09 NOTE — Progress Notes (Signed)
Subjective:   Connie Marshall is a 77 y.o. female who presents for Medicare Annual (Subsequent) preventive examination.  Visit Complete: Virtual  I connected with  Connie Marshall on 06/09/23 by a audio enabled telemedicine application and verified that I am speaking with the correct person using two identifiers.  Patient Location: Home  Provider Location: Office/Clinic  I discussed the limitations of evaluation and management by telemedicine. The patient expressed understanding and agreed to proceed.  Vital Signs: Because this visit was a virtual/telehealth visit, some criteria may be missing or patient reported. Any vitals not documented were not able to be obtained and vitals that have been documented are patient reported.    Review of Systems    Cardiac Risk Factors include: advanced age (>36men, >38 women);dyslipidemia;hypertension;obesity (BMI >30kg/m2)     Objective:    Today's Vitals   06/09/23 0947  Weight: 215 lb (97.5 kg)  Height: 5\' 7"  (1.702 m)   Body mass index is 33.67 kg/m.     06/09/2023    9:55 AM 05/21/2023    9:49 AM 04/24/2023   10:28 AM 06/07/2022   11:25 AM 05/14/2022    6:43 PM 05/08/2022    9:33 AM 01/08/2021    2:14 PM  Advanced Directives  Does Patient Have a Medical Advance Directive? Yes No Yes Yes No Yes Yes  Type of Estate agent of [place];Living will   Healthcare Power of [place];Living will  Living will Living will  Does patient want to make changes to medical advance directive?     No - Patient declined No - Patient declined No - Patient declined  Copy of Healthcare Power of Attorney in Chart? No - copy requested   No - copy requested     Would patient like information on creating a medical advance directive?     No - Patient declined      Current Medications (verified) Outpatient Encounter Medications as of 06/09/2023  Medication Sig   acetaminophen (TYLENOL) 650 MG CR tablet Take 1,300 mg by mouth 2 (two) times daily.    anastrozole (ARIMIDEX) 1 MG tablet TAKE 1 TABLET(1 MG) BY MOUTH DAILY   B Complex-C (SUPER B COMPLEX PO) Take 1 capsule by mouth daily.   Cholecalciferol (VITAMIN D3 SUPER STRENGTH) 50 MCG (2000 UT) TABS Take 4,000 Units by mouth daily.   cyanocobalamin 2000 MCG tablet Take 2,000 mcg by mouth daily.   fosinopril (MONOPRIL) 20 MG tablet TAKE 1 TABLET(20 MG) BY MOUTH DAILY   furosemide (LASIX) 40 MG tablet 1 tab by mouth in the AM, and 1 in the PM as needed for leg swelling   latanoprost (XALATAN) 0.005 % ophthalmic solution 1 drop at bedtime.   Misc Natural Products (TURMERIC CURCUMIN) CAPS Take 1 capsule by mouth daily. Take for immune system   simvastatin (ZOCOR) 20 MG tablet Take 1 tablet (20 mg total) by mouth every evening.   hydrALAZINE (APRESOLINE) 10 MG tablet Take 1 tablet (10 mg total) by mouth 3 (three) times daily as needed (If blood pressure over 170 and you are feeling symptomatic.). (Patient not taking: Reported on 06/09/2023)   No facility-administered encounter medications on file as of 06/09/2023.    Allergies (verified) Tape and Silicone   History: Past Medical History:  Diagnosis Date   Axillary mass, left    Cancer (HCC)    Complication of anesthesia    Diverticulosis    Hemorrhoids    Hyperlipidemia    Hypertension    IBS (  irritable bowel syndrome)    Obesity    PONV (postoperative nausea and vomiting)    Pre-diabetes    Psoriasis    Shingles 02/19/2021   Past Surgical History:  Procedure Laterality Date   BREAST LUMPECTOMY WITH RADIOACTIVE SEED AND SENTINEL LYMPH NODE BIOPSY Bilateral 10/05/2019   Procedure: BILATERAL BREAST LUMPECTOMY WITH BILATERAL RADIOACTIVE SEEDS AND LEFT SENTINEL LYMPH NODE BIOPSY;  Surgeon: Abigail Miyamoto, MD;  Location: [facility];  Service: General;  Laterality: Bilateral;   BREAST SURGERY     CESAREAN SECTION     2 times   CHOLECYSTECTOMY     laproscopic   CHOLECYSTECTOMY, LAPAROSCOPIC     kidney stone  removed  10/14/2009   MASS EXCISION Left 07/12/2019   Procedure: EXCISION LEFT AXILLARY MASS;  Surgeon: Abigail Miyamoto, MD;  Location: [facility];  Service: General;  Laterality: Left;   RE-EXCISION OF BREAST LUMPECTOMY Left 10/05/2019   Procedure: RE-EXCISION OF LEFT BREAST CANCER;  Surgeon: Abigail Miyamoto, MD;  Location: [facility];  Service: General;  Laterality: Left;   THYROGLOSSAL DUCT CYST  10/14/1998   TONSILLECTOMY  10/14/1977   TOTAL HIP ARTHROPLASTY Right 05/14/2022   Procedure: RIGHT TOTAL HIP ARTHROPLASTY ANTERIOR APPROACH;  Surgeon: Kathryne Hitch, MD;  Location: [facility];  Service: Orthopedics;  Laterality: Right;   umbilical herniorrhapy     VAGINAL HYSTERECTOMY  10/14/2001   Family History  Problem Relation Age of Onset   Hypertension Mother    Diabetes Father    Hypertension Father    Parkinson's disease Sister    Prostate cancer Brother    Social History   Socioeconomic History   Marital status: Widowed    Spouse name: Not on file   Number of children: 2   Years of education: college   Highest education level: Not on file  Occupational History   Occupation: Retired  Tobacco Use   Smoking status: Never   Smokeless tobacco: Never  Vaping Use   Vaping status: Never Used  Substance and Sexual Activity   Alcohol use: No   Drug use: No   Sexual activity: Not Currently  Other Topics Concern   Not on file  Social History Narrative   HSG, [employer] for 2 years. Married, '68-2 years- widowed; '76- 17 years/ widowed '93. 1 son- '77, 1 daughter- '78, 4 grandchildren. 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 Lives alone - I- ADLS.   Social Determinants of Health   Financial Resource Strain: Low Risk  (06/09/2023)   Overall Financial Resource Strain (CARDIA)    Difficulty of Paying Living Expenses: Not hard at all  Food Insecurity: No Food Insecurity (06/09/2023)   Hunger Vital Sign    Worried About  Running Out of Food in the Last Year: Never true    Ran Out of Food in the Last Year: Never true  Transportation Needs: No Transportation Needs (06/09/2023)   PRAPARE - Administrator, Civil Service (Medical): No    Lack of Transportation (Non-Medical): No  Physical Activity: Insufficiently Active (06/09/2023)   Exercise Vital Sign    Days of Exercise per Week: 5 days    Minutes of Exercise per Session: 20 min  Stress: No Stress Concern Present (06/09/2023)   [employer] of Occupational Health - Occupational Stress Questionnaire    Feeling of Stress : Not at all  Social Connections: Moderately Isolated (06/09/2023)   Social Connection and Isolation Panel [NHANES]    Frequency  of Communication with Friends and Family: More than three times a week    Frequency of Social Gatherings with Friends and Family: Twice a week    Attends Religious Services: 1 to 4 times per year    Active Member of [employer] or Organizations: No    Attends Banker Meetings: Never    Marital Status: Widowed    Tobacco Counseling Counseling given: Not Answered   Clinical Intake:  Pre-visit preparation completed: Yes  Pain : No/denies pain     BMI - recorded: 33.67 Nutritional Status: BMI > 30  Obese Nutritional Risks: None Diabetes: No  How often do you need to have someone help you when you read instructions, pamphlets, or other written materials from your doctor or pharmacy?: 1 - Never  Interpreter Needed?: No  Information entered by :: Markeia Harkless, RMA   Activities of Daily Living    06/09/2023    9:50 AM  In your present state of health, do you have any difficulty performing the following activities:  Hearing? 0  Vision? 0  Difficulty concentrating or making decisions? 0  Walking or climbing stairs? 0  Dressing or bathing? 0  Doing errands, shopping? 0  Preparing Food and eating ? N  Using the Toilet? N  In the past six months, have you accidently leaked  urine? Y  Do you have problems with loss of bowel control? N  Managing your Medications? N  Managing your Finances? N  Housekeeping or managing your Housekeeping? N    Patient Care Team: Corwin Levins, MD as PCP - General (Internal Medicine) Ihor Gully, MD (Inactive) (Urology) Keturah Barre, MD (Otolaryngology) Pershing Proud, RN as Oncology Nurse Navigator Donnelly Angelica, RN as Oncology Nurse Navigator Drema Halon, MD (Inactive) as Consulting Physician (Otolaryngology) [place], Heather, OD as Consulting Physician (Optometry) Axel Filler, Larna Daughters, NP as Nurse Practitioner (Hematology and Oncology)  Indicate any recent Medical Services you may have received from other than [facility] providers in the past year (date may be approximate).     Assessment:   This is a routine wellness examination for [place].  Hearing/Vision screen Hearing Screening - Comments:: Denies hearing difficulties   Vision Screening - Comments:: Denied vision issues/wears readers  Dietary issues and exercise activities discussed:     Goals Addressed             This Visit's Progress    Patient Stated   On track    Maintain current health status.       Depression Screen    06/09/2023   10:01 AM 01/29/2023    8:37 AM 10/23/2022    2:55 PM 06/12/2022    9:56 AM 06/07/2022   11:24 AM 06/07/2022   11:22 AM 01/16/2022   11:13 AM  PHQ 2/9 Scores  PHQ - 2 Score 0 0 0 0 0 0 0  PHQ- 9 Score 0          Fall Risk    06/09/2023    9:56 AM 01/29/2023    8:37 AM 06/07/2022   11:18 AM 01/16/2022   11:13 AM 01/16/2022   10:42 AM  Fall Risk   Falls in the past year? 0 0 0 0 0  Number falls in past yr: 0 0 0 0 0  Injury with Fall? 0 0 0 0 0  Risk for fall due to : No Fall Risks No Fall Risks No Fall Risks    Follow up Falls prevention discussed;Falls evaluation  completed Falls evaluation completed Falls prevention discussed      MEDICARE RISK AT HOME: Medicare Risk at Home Any stairs in or  around the home?: Yes (In the front and back outside) If so, are there any without handrails?: Yes Home free of loose throw rugs in walkways, pet beds, electrical cords, etc?: Yes Adequate lighting in your home to reduce risk of falls?: Yes Life alert?: Yes Use of a cane, walker or w/c?: No Grab bars in the bathroom?: Yes Shower chair or bench in shower?: Yes Elevated toilet seat or a handicapped toilet?: Yes  TIMED UP AND GO:  Was the test performed?  No    Cognitive Function:        06/09/2023    9:57 AM 06/07/2022   11:25 AM  6CIT Screen  What Year? 0 points 0 points  What month? 0 points 0 points  What time? 0 points 0 points  Count back from 20 0 points 0 points  Months in reverse 0 points 0 points  Repeat phrase 0 points 0 points  Total Score 0 points 0 points    Immunizations Immunization History  Administered Date(s) Administered   Fluad Quad(high Dose 65+) 07/05/2019, 08/12/2020, 07/18/2022   Influenza Split 08/05/2011, 08/20/2012   Influenza Whole 07/18/2009   Influenza, High Dose Seasonal PF 09/06/2013, 09/13/2015, 08/12/2016, 07/16/2017, 07/21/2018   Influenza,inj,Quad PF,6+ Mos 09/07/2014   Influenza-Unspecified 07/24/2021   PFIZER(Purple Top)SARS-COV-2 Vaccination 11/18/2019, 12/09/2019, 09/29/2020   Pfizer Covid-19 Vaccine Bivalent Booster 15yrs & up 07/10/2021, 08/01/2022   Pneumococcal Conjugate-13 09/27/2013   Pneumococcal Polysaccharide-23 08/05/2011   Td 10/15/2006   Tdap 09/29/2017   Zoster Recombinant(Shingrix) 02/04/2022, 04/10/2022   Zoster, Live 08/02/2010    TDAP status: Up to date  Flu Vaccine status: Due, Education has been provided regarding the importance of this vaccine. Advised may receive this vaccine at local pharmacy or Health Dept. Aware to provide a copy of the vaccination record if obtained from local pharmacy or Health Dept. Verbalized acceptance and understanding.  Pneumococcal vaccine status: Up to date  Covid-19 vaccine  status: Information provided on how to obtain vaccines.   Qualifies for Shingles Vaccine? Yes   Zostavax completed Yes   Shingrix Completed?: Yes  Screening Tests Health Maintenance  Topic Date Due   COVID-19 Vaccine (6 - 2023-24 season) 09/26/2022   INFLUENZA VACCINE  05/15/2023   HEMOGLOBIN A1C  07/30/2023   OPHTHALMOLOGY EXAM  11/26/2023   Diabetic kidney evaluation - Urine ACR  01/28/2024   FOOT EXAM  01/29/2024   Diabetic kidney evaluation - eGFR measurement  04/23/2024   Medicare Annual Wellness (AWV)  06/08/2024   DTaP/Tdap/Td (3 - Td or Tdap) 09/30/2027   Pneumonia Vaccine 39+ Years old  Completed   DEXA SCAN  Completed   Hepatitis C Screening  Completed   Zoster Vaccines- Shingrix  Completed   HPV VACCINES  Aged Out   Colonoscopy  Discontinued    Health Maintenance  Health Maintenance Due  Topic Date Due   COVID-19 Vaccine (6 - 2023-24 season) 09/26/2022   INFLUENZA VACCINE  05/15/2023    Colorectal cancer screening: Type of screening: Colonoscopy. Completed 06/10/2014. Repeat every 10 years  Mammogram status: Completed 10/25/2022. Repeat every year  Bone Density status: Completed 11/09/2020. Results reflect: Bone density results: NORMAL. Repeat every 2 years.  Lung Cancer Screening: (Low Dose CT Chest recommended if Age 78-80 years, 20 pack-year currently smoking OR have quit w/in 15years.) does not qualify.   Lung Cancer Screening  Referral: N/A  Additional Screening:  Hepatitis C Screening: does qualify; Completed 09/05/2015  Vision Screening: Recommended annual ophthalmology exams for early detection of glaucoma and other disorders of the eye. Is the patient up to date with their annual eye exam?  Yes  Who is the provider or what is the name of the office in which the patient attends annual eye exams? Dr. Louanna Raw If pt is not established with a provider, would they like to be referred to a provider to establish care? No .   Dental Screening: Recommended  annual dental exams for proper oral hygiene  Diabetic Foot Exam: Diabetic Foot Exam: Completed 01/29/2023  Community Resource Referral / Chronic Care Management: CRR required this visit?  No   CCM required this visit?  No     Plan:     I have personally reviewed and noted the following in the patient's chart:   Medical and social history Use of alcohol, tobacco or illicit drugs  Current medications and supplements including opioid prescriptions. Patient is not currently taking opioid prescriptions. Functional ability and status Nutritional status Physical activity Advanced directives List of other physicians Hospitalizations, surgeries, and ER visits in previous 12 months Vitals Screenings to include cognitive, depression, and falls Referrals and appointments  In addition, I have reviewed and discussed with patient certain preventive protocols, quality metrics, and best practice recommendations. A written personalized care plan for preventive services as well as general preventive health recommendations were provided to patient.     Tai Skelly L Mithran Strike, CMA   06/09/2023   After Visit Summary: (MyChart) Due to this being a telephonic visit, the after visit summary with patients personalized plan was offered to patient via MyChart   Nurse Notes: Patient is due for a Bone Density screening, as she had one in 2022, however she would like to discuss with provider about getting another screening.  Patient will soon be due for a Flu and Covid vaccine.  Patient had no other concerns to address today.

## 2023-06-09 NOTE — Patient Instructions (Signed)
Connie Marshall , Thank you for taking time to come for your Medicare Wellness Visit. I appreciate your ongoing commitment to your health goals. Please review the following plan we discussed and let me know if I can assist you in the future.   Referrals/Orders/Follow-Ups/Clinician Recommendations: You will soon be due for a Flu and Covid vaccine and can get these done at your pharmacy.  Remember to discuss a Bone Density screening with your provider.  Keep up the good work and it was nice talking with today.  This is a list of the screening recommended for you and due dates:  Health Maintenance  Topic Date Due   COVID-19 Vaccine (6 - 2023-24 season) 09/26/2022   Flu Shot  05/15/2023   Hemoglobin A1C  07/30/2023   Eye exam for diabetics  11/26/2023   Yearly kidney health urinalysis for diabetes  01/28/2024   Complete foot exam   01/29/2024   Yearly kidney function blood test for diabetes  04/23/2024   Medicare Annual Wellness Visit  06/08/2024   DTaP/Tdap/Td vaccine (3 - Td or Tdap) 09/30/2027   Pneumonia Vaccine  Completed   DEXA scan (bone density measurement)  Completed   Hepatitis C Screening  Completed   Zoster (Shingles) Vaccine  Completed   HPV Vaccine  Aged Out   Colon Cancer Screening  Discontinued    Advanced directives: (Copy Requested) Please bring a copy of your health care power of attorney and living will to the office to be added to your chart at your convenience.  Next Medicare Annual Wellness Visit scheduled for next year: Yes

## 2023-06-10 ENCOUNTER — Encounter: Payer: Self-pay | Admitting: Physical Therapy

## 2023-06-10 ENCOUNTER — Ambulatory Visit: Payer: Medicare Other | Admitting: Physical Therapy

## 2023-06-10 DIAGNOSIS — I890 Lymphedema, not elsewhere classified: Secondary | ICD-10-CM | POA: Diagnosis not present

## 2023-06-10 DIAGNOSIS — L599 Disorder of the skin and subcutaneous tissue related to radiation, unspecified: Secondary | ICD-10-CM

## 2023-06-10 DIAGNOSIS — M25612 Stiffness of left shoulder, not elsewhere classified: Secondary | ICD-10-CM

## 2023-06-10 DIAGNOSIS — C50911 Malignant neoplasm of unspecified site of right female breast: Secondary | ICD-10-CM

## 2023-06-10 DIAGNOSIS — R293 Abnormal posture: Secondary | ICD-10-CM

## 2023-06-10 NOTE — Therapy (Addendum)
OUTPATIENT PHYSICAL THERAPY  UPPER EXTREMITY ONCOLOGY TREATMMENT  Patient Name: Connie Marshall MRN: [id number] DOB:[birth year], 77 y.o., female Today's Date: 06/10/2023  END OF SESSION:  PT End of Session - 06/10/23 0907     Visit Number 5    Number of Visits 9    Date for PT Re-Evaluation 06/18/23    PT Start Time 0906    PT Stop Time 0953    PT Time Calculation (min) 47 min    Activity Tolerance Patient tolerated treatment well    Behavior During Therapy [facility] for tasks assessed/performed             Past Medical History:  Diagnosis Date   Axillary mass, left    Cancer (HCC)    Complication of anesthesia    Diverticulosis    Hemorrhoids    Hyperlipidemia    Hypertension    IBS (irritable bowel syndrome)    Obesity    PONV (postoperative nausea and vomiting)    Pre-diabetes    Psoriasis    Shingles 02/19/2021   Past Surgical History:  Procedure Laterality Date   BREAST LUMPECTOMY WITH RADIOACTIVE SEED AND SENTINEL LYMPH NODE BIOPSY Bilateral 10/05/2019   Procedure: BILATERAL BREAST LUMPECTOMY WITH BILATERAL RADIOACTIVE SEEDS AND LEFT SENTINEL LYMPH NODE BIOPSY;  Surgeon: Abigail Miyamoto, MD;  Location: [facility];  Service: General;  Laterality: Bilateral;   BREAST SURGERY     CESAREAN SECTION     2 times   CHOLECYSTECTOMY     laproscopic   CHOLECYSTECTOMY, LAPAROSCOPIC     kidney stone removed  10/14/2009   MASS EXCISION Left 07/12/2019   Procedure: EXCISION LEFT AXILLARY MASS;  Surgeon: Abigail Miyamoto, MD;  Location: [facility];  Service: General;  Laterality: Left;   RE-EXCISION OF BREAST LUMPECTOMY Left 10/05/2019   Procedure: RE-EXCISION OF LEFT BREAST CANCER;  Surgeon: Abigail Miyamoto, MD;  Location: [facility];  Service: General;  Laterality: Left;   THYROGLOSSAL DUCT CYST  10/14/1998   TONSILLECTOMY  10/14/1977   TOTAL HIP ARTHROPLASTY Right 05/14/2022   Procedure: RIGHT TOTAL HIP ARTHROPLASTY ANTERIOR  APPROACH;  Surgeon: Kathryne Hitch, MD;  Location: [facility];  Service: Orthopedics;  Laterality: Right;   umbilical herniorrhapy     VAGINAL HYSTERECTOMY  10/14/2001   Patient Active Problem List   Diagnosis Date Noted   Venous insufficiency 01/29/2023   Status post total replacement of right hip 05/14/2022   Unilateral primary osteoarthritis, right hip 03/27/2022   Preop exam for internal medicine 01/16/2022   Insect bite, infected 02/19/2021   Shingles outbreak 02/19/2021   Dysuria 05/16/2020   Right knee pain 05/16/2020   Carcinoma of upper-outer quadrant of left breast in female, estrogen receptor positive (HCC) 08/25/2019   Bilateral breast cancer (HCC) 08/17/2019   Skin nodule 05/11/2019   B12 deficiency 05/11/2019   Vitamin D deficiency 05/11/2019   Acute lymphadenitis 07/16/2017   Eustachian tube dysfunction, bilateral 07/16/2017   Prolapse of female bladder, acquired 03/25/2017   Sudden right hearing loss 03/25/2017   Left otitis media 03/25/2017   Rash 11/07/2015   Abnormal mammogram 09/13/2015   Blister of toe of left foot 04/13/2015   Breast discharge 04/13/2015   Encounter for well adult exam with abnormal findings 08/06/2011   Microscopic hematuria 04/27/2010   ANXIETY 03/25/2009   PARESTHESIA 03/23/2009   PERIPHERAL EDEMA 03/23/2009   HEMORRHOIDS, INTERNAL 02/13/2008   DIVERTICULOSIS, COLON 02/13/2008   IBS 02/13/2008   CHOLECYSTECTOMY, LAPAROSCOPIC, HX OF  02/13/2008   OBESITY NOS 07/07/2007   Diabetes (HCC) 07/06/2007   Hyperlipidemia 07/06/2007   Essential hypertension 07/06/2007    PCP: Oliver Barre, MD  REFERRING PROVIDER: Serena Croissant, MD  REFERRING DIAG: C50.911,Z17.0,C50.912 (ICD-10-CM) - Bilateral malignant neoplasm of breast in female, estrogen receptor positive, unspecified site of breast (HCC)   THERAPY DIAG:  Lymphedema, not elsewhere classified  Stiffness of left shoulder, not elsewhere classified  Disorder of the skin and  subcutaneous tissue related to radiation, unspecified  Abnormal posture  Bilateral malignant neoplasm of breast in female, estrogen receptor positive, unspecified site of breast [facility])  ONSET DATE: 07/2022  Rationale for Evaluation and Treatment: Rehabilitation  SUBJECTIVE:                                                                                                                                                                                           SUBJECTIVE STATEMENT:  The breast is getting softer. Once I get the compression bra on I am fine. I wear it every day. I have been sleeping in it.   PERTINENT HISTORY: Hx of bilateral breast cancer- 10/05/19 R lumpectomy - intraductal papilloma no malignacy, L lumpectomy - fibrocystic change intraductal pailloma no malignacy idencidinfed, L breast re excision IDC grade 2, DCIS ER/PR+, HER2- 0/6 nodes Ki-67- 15%, completed radiation 12/22/2019. R THA 05/14/2022  PAIN:  Are you having pain? Yes soreness under L breast, intermittent   PRECAUTIONS: None  RED FLAGS: None   WEIGHT BEARING RESTRICTIONS: No  FALLS:  Has patient fallen in last 6 months? No  LIVING ENVIRONMENT: Lives with: lives alone Lives in: House/apartment Stairs: No;  Has following equipment at home: Single point cane, Environmental consultant - 2 wheeled, Environmental consultant - 4 wheeled, Wheelchair (manual), shower chair, and bed side commode  OCCUPATION: retired  LEISURE: [facility] [employer] [facility]  HAND DOMINANCE: right   PRIOR LEVEL OF FUNCTION: Independent  PATIENT GOALS: to get the breast to go down   OBJECTIVE:  COGNITION: Overall cognitive status: Within functional limits for tasks assessed   PALPATION: L breast very fibrotic   OBSERVATIONS / OTHER ASSESSMENTS: peau d'orange texture, about 30-50% larger than R side  POSTURE: forward head, rounded shoulders  UPPER EXTREMITY AROM/PROM:  A/PROM RIGHT   eval   Shoulder extension 78  Shoulder flexion 143   Shoulder abduction 167  Shoulder internal rotation 59  Shoulder external rotation 60    (Blank rows = not tested)  A/PROM LEFT   eval  Shoulder extension 75  Shoulder flexion 58  Shoulder abduction 64  Shoulder internal rotation Unable to assess   Shoulder  external rotation Unable to assess    (Blank rows = not tested)  CERVICAL AROM: All within normal limits:    Percent limited  Flexion   Extension   Right lateral flexion   Left lateral flexion   Right rotation   Left rotation      LYMPHEDEMA ASSESSMENTS:   LANDMARK RIGHT  eval  At axilla    15 cm proximal to olecranon process 33.5  10 cm proximal to olecranon process 32  Olecranon process 26.9  15 cm proximal to ulnar styloid process 24  10 cm proximal to ulnar styloid process 21  Just proximal to ulnar styloid process 17.2  Across hand at thumb web space 21.5  At base of 2nd digit 6.6  (Blank rows = not tested)  LANDMARK LEFT  eval  At axilla    15 cm proximal to olecranon process 35.4  10 cm proximal to olecranon process 31.8  Olecranon process 27  15 cm proximal to ulnar styloid process 23.9  10 cm proximal to ulnar styloid process 20.4  Just proximal to ulnar styloid process 17.4  Across hand at thumb web space 21.8  At base of 2nd digit 6.5  (Blank rows = not tested)      TODAY'S TREATMENT:                                                                                                                                          DATE:  Pt permission and consent throughout each step of examination and treatment with modification and draping if requested when working on sensitive areas 06/10/23: Pt described sequence to therapist that she has been completing at home. She had forgotten to create inter axillary pathway. MLD  as follows with therapist educating pt in every step: In supine: Short neck, 5 diaphragmatic breaths, R axillary nodes and establishment of interaxillary pathway, L inguinal nodes  and establishment of axilloinguinal pathway, then L breast moving fluid towards pathways spending extra time in any areas of fibrosis then retracing all steps.   06/04/23: Instructed pt in self MLD today and had her return demonstrate each step with pt practicing for the first part of the session and therapist completing session as follows: In supine: Short neck, 5 diaphragmatic breaths, R axillary nodes and establishment of interaxillary pathway, L inguinal nodes and establishment of axilloinguinal pathway, then L breast moving fluid towards pathways spending extra time in any areas of fibrosis then retracing all steps.   06/02/2023 Educated pt about role of lymphatics, importance of stretch, activation of LN's etc and discussed techniques while performing In supine: Short neck, 5 diaphragmatic breaths, R axillary nodes and establishment of interaxillary pathway, L inguinal nodes and establishment of axilloinguinal pathway, then L breast moving fluid towards pathways spending extra time in any areas of fibrosis then retracing all steps.  05/27/2023 Educated pt about role of lymphatics, importance of  stretch, activation of LN's etc and discussed techniques while performing In supine: Short neck, 5 diaphragmatic breaths, R axillary nodes and establishment of interaxillary pathway, L inguinal nodes and establishment of axilloinguinal pathway, then L breast moving fluid towards pathways spending extra time in any areas of fibrosis then retracing all steps.    05/21/23: created foam chip pack for pt to wear in her bra for additional compression, issued script for compression bra    PATIENT EDUCATION:  Education details: lymphedema, anatomy and physiology of the lymphatic system, how to manage breast lymphedema, compression bra Person educated: Patient Education method: Explanation Education comprehension: verbalized understanding  HOME EXERCISE PROGRAM: Wear chip pack as much as tolerable Obtain  compression bra and wear as much as possible Self MLD to R breast  ASSESSMENT:  CLINICAL IMPRESSION: Pt has been practicing self MLD at home. Educated pt in importance of creating inter axillary pathway prior to do doing breast MLD. Continued educating pt in correct sequence and technique. Pt has been wearing her compression bra and there is improvement in fibrosis in her R breast.   OBJECTIVE IMPAIRMENTS: decreased knowledge of condition, decreased knowledge of use of DME, decreased ROM, decreased strength, increased edema, increased fascial restrictions, impaired UE functional use, postural dysfunction, and pain.   ACTIVITY LIMITATIONS: carrying, lifting, sleeping, and reach over head  PARTICIPATION LIMITATIONS: cleaning, laundry, and community activity  PERSONAL FACTORS: Time since onset of injury/illness/exacerbation are also affecting patient's functional outcome.   REHAB POTENTIAL: Good  CLINICAL DECISION MAKING: Stable/uncomplicated  EVALUATION COMPLEXITY: Low  GOALS: Goals reviewed with patient? Yes  SHORT TERM GOALS=LONG TERM GOALS Target date: 06/18/23  Pt will be independent in self MLD for L breast for long term management of lymphedema. Baseline: Goal status: INITIAL  2.  Pt will obtain a compression bra for long term management of lymphedema. Baseline:  Goal status: INITIAL  3.  Pt will demonstrate 120 degrees of L shoulder flexion to allow imprved function. Baseline:  Goal status: INITIAL  4.  Pt will demonstrate 120 degrees of L shoulder abduction to allow improved function. Baseline:  Goal status: INITIAL  5.  Pt will be independent in a home exercise program for continued strengthening and stretching.  Baseline:  Goal status: INITIAL  6.  Pt will report a 50% improvement in heaviness of L breast to allow improved comfort.  Baseline:  Goal status: INITIAL  PLAN:  PT FREQUENCY: 2x/week  PT DURATION: 4 weeks  PLANNED INTERVENTIONS: Therapeutic  exercises, Therapeutic activity, Patient/Family education, Self Care, Joint mobilization, Orthotic/Fit training, Manual lymph drainage, Compression bandaging, scar mobilization, Taping, Vasopneumatic device, Manual therapy, and Re-evaluation  PLAN FOR NEXT SESSION: cont MLD to L breast and eventually instruct pt, did she get a compression bra?, PROM to L shoulder, scap mobs to L scap  [employer], PT 06/10/2023, 9:55 AM

## 2023-06-12 ENCOUNTER — Encounter: Payer: Self-pay | Admitting: Physical Therapy

## 2023-06-12 ENCOUNTER — Ambulatory Visit: Payer: Medicare Other | Admitting: Physical Therapy

## 2023-06-12 DIAGNOSIS — R293 Abnormal posture: Secondary | ICD-10-CM

## 2023-06-12 DIAGNOSIS — M25612 Stiffness of left shoulder, not elsewhere classified: Secondary | ICD-10-CM

## 2023-06-12 DIAGNOSIS — C50911 Malignant neoplasm of unspecified site of right female breast: Secondary | ICD-10-CM

## 2023-06-12 DIAGNOSIS — I890 Lymphedema, not elsewhere classified: Secondary | ICD-10-CM | POA: Diagnosis not present

## 2023-06-12 DIAGNOSIS — L599 Disorder of the skin and subcutaneous tissue related to radiation, unspecified: Secondary | ICD-10-CM

## 2023-06-12 NOTE — Therapy (Addendum)
OUTPATIENT PHYSICAL THERAPY  UPPER EXTREMITY ONCOLOGY TREATMMENT  Patient Name: Connie Marshall MRN: [id number] DOB:04-23-1946, 77 y.o., female Today's Date: 06/12/2023  END OF SESSION:  PT End of Session - 06/12/23 0906     Visit Number 6    Number of Visits 9    Date for PT Re-Evaluation 06/18/23    PT Start Time 0905    PT Stop Time 0956    PT Time Calculation (min) 51 min    Activity Tolerance Patient tolerated treatment well    Behavior During Therapy WFL for tasks assessed/performed             Past Medical History:  Diagnosis Date   Axillary mass, left    Cancer (HCC)    Complication of anesthesia    Diverticulosis    Hemorrhoids    Hyperlipidemia    Hypertension    IBS (irritable bowel syndrome)    Obesity    PONV (postoperative nausea and vomiting)    Pre-diabetes    Psoriasis    Shingles 02/19/2021   Past Surgical History:  Procedure Laterality Date   BREAST LUMPECTOMY WITH RADIOACTIVE SEED AND SENTINEL LYMPH NODE BIOPSY Bilateral 10/05/2019   Procedure: BILATERAL BREAST LUMPECTOMY WITH BILATERAL RADIOACTIVE SEEDS AND LEFT SENTINEL LYMPH NODE BIOPSY;  Surgeon: Abigail Miyamoto, MD;  Location: [facility];  Service: General;  Laterality: Bilateral;   BREAST SURGERY     CESAREAN SECTION     2 times   CHOLECYSTECTOMY     laproscopic   CHOLECYSTECTOMY, LAPAROSCOPIC     kidney stone removed  10/14/2009   MASS EXCISION Left 07/12/2019   Procedure: EXCISION LEFT AXILLARY MASS;  Surgeon: Abigail Miyamoto, MD;  Location: [facility];  Service: General;  Laterality: Left;   RE-EXCISION OF BREAST LUMPECTOMY Left 10/05/2019   Procedure: RE-EXCISION OF LEFT BREAST CANCER;  Surgeon: Abigail Miyamoto, MD;  Location: [facility];  Service: General;  Laterality: Left;   THYROGLOSSAL DUCT CYST  10/14/1998   TONSILLECTOMY  10/14/1977   TOTAL HIP ARTHROPLASTY Right 05/14/2022   Procedure: RIGHT TOTAL HIP ARTHROPLASTY ANTERIOR  APPROACH;  Surgeon: Kathryne Hitch, MD;  Location: [facility];  Service: Orthopedics;  Laterality: Right;   umbilical herniorrhapy     VAGINAL HYSTERECTOMY  10/14/2001   Patient Active Problem List   Diagnosis Date Noted   Venous insufficiency 01/29/2023   Status post total replacement of right hip 05/14/2022   Unilateral primary osteoarthritis, right hip 03/27/2022   Preop exam for internal medicine 01/16/2022   Insect bite, infected 02/19/2021   Shingles outbreak 02/19/2021   Dysuria 05/16/2020   Right knee pain 05/16/2020   Carcinoma of upper-outer quadrant of left breast in female, estrogen receptor positive (HCC) 08/25/2019   Bilateral breast cancer (HCC) 08/17/2019   Skin nodule 05/11/2019   B12 deficiency 05/11/2019   Vitamin D deficiency 05/11/2019   Acute lymphadenitis 07/16/2017   Eustachian tube dysfunction, bilateral 07/16/2017   Prolapse of female bladder, acquired 03/25/2017   Sudden right hearing loss 03/25/2017   Left otitis media 03/25/2017   Rash 11/07/2015   Abnormal mammogram 09/13/2015   Blister of toe of left foot 04/13/2015   Breast discharge 04/13/2015   Encounter for well adult exam with abnormal findings 08/06/2011   Microscopic hematuria 04/27/2010   ANXIETY 03/25/2009   PARESTHESIA 03/23/2009   PERIPHERAL EDEMA 03/23/2009   HEMORRHOIDS, INTERNAL 02/13/2008   DIVERTICULOSIS, COLON 02/13/2008   IBS 02/13/2008   CHOLECYSTECTOMY, LAPAROSCOPIC, HX OF  02/13/2008   OBESITY NOS 07/07/2007   Diabetes (HCC) 07/06/2007   Hyperlipidemia 07/06/2007   Essential hypertension 07/06/2007    PCP: Oliver Barre, MD  REFERRING PROVIDER: Serena Croissant, MD  REFERRING DIAG: C50.911,Z17.0,C50.912 (ICD-10-CM) - Bilateral malignant neoplasm of breast in female, estrogen receptor positive, unspecified site of breast (HCC)   THERAPY DIAG:  Lymphedema, not elsewhere classified  Stiffness of left shoulder, not elsewhere classified  Disorder of the skin and  subcutaneous tissue related to radiation, unspecified  Abnormal posture  Bilateral malignant neoplasm of breast in female, estrogen receptor positive, unspecified site of breast [facility])  ONSET DATE: 07/2022  Rationale for Evaluation and Treatment: Rehabilitation  SUBJECTIVE:                                                                                                                                                                                           SUBJECTIVE STATEMENT:  The massage is going ok.   PERTINENT HISTORY: Hx of bilateral breast cancer- 10/05/19 R lumpectomy - intraductal papilloma no malignacy, L lumpectomy - fibrocystic change intraductal pailloma no malignacy idencidinfed, L breast re excision IDC grade 2, DCIS ER/PR+, HER2- 0/6 nodes Ki-67- 15%, completed radiation 12/22/2019. R THA 05/14/2022  PAIN:  Are you having pain? No   PRECAUTIONS: None  RED FLAGS: None   WEIGHT BEARING RESTRICTIONS: No  FALLS:  Has patient fallen in last 6 months? No  LIVING ENVIRONMENT: Lives with: lives alone Lives in: House/apartment Stairs: No;  Has following equipment at home: Single point cane, Environmental consultant - 2 wheeled, Environmental consultant - 4 wheeled, Wheelchair (manual), shower chair, and bed side commode  OCCUPATION: retired  LEISURE: [facility]  HAND DOMINANCE: right   PRIOR LEVEL OF FUNCTION: Independent  PATIENT GOALS: to get the breast to go down   OBJECTIVE:  COGNITION: Overall cognitive status: Within functional limits for tasks assessed   PALPATION: L breast very fibrotic   OBSERVATIONS / OTHER ASSESSMENTS: peau d'orange texture, about 30-50% larger than R side  POSTURE: forward head, rounded shoulders  UPPER EXTREMITY AROM/PROM:  A/PROM RIGHT   eval   Shoulder extension 78  Shoulder flexion 143  Shoulder abduction 167  Shoulder internal rotation 59  Shoulder external rotation 60    (Blank rows = not tested)  A/PROM LEFT   eval   Shoulder extension 75  Shoulder flexion 58  Shoulder abduction 64  Shoulder internal rotation Unable to assess   Shoulder external rotation Unable to assess    (Blank rows = not tested)  CERVICAL AROM: All within normal limits:    Percent limited  Flexion   Extension   Right lateral flexion   Left lateral flexion   Right rotation   Left rotation      LYMPHEDEMA ASSESSMENTS:   LANDMARK RIGHT  eval  At axilla    15 cm proximal to olecranon process 33.5  10 cm proximal to olecranon process 32  Olecranon process 26.9  15 cm proximal to ulnar styloid process 24  10 cm proximal to ulnar styloid process 21  Just proximal to ulnar styloid process 17.2  Across hand at thumb web space 21.5  At base of 2nd digit 6.6  (Blank rows = not tested)  LANDMARK LEFT  eval  At axilla    15 cm proximal to olecranon process 35.4  10 cm proximal to olecranon process 31.8  Olecranon process 27  15 cm proximal to ulnar styloid process 23.9  10 cm proximal to ulnar styloid process 20.4  Just proximal to ulnar styloid process 17.4  Across hand at thumb web space 21.8  At base of 2nd digit 6.5  (Blank rows = not tested)      TODAY'S TREATMENT:                                                                                                                                          DATE:  Pt permission and consent throughout each step of examination and treatment with modification and draping if requested when working on sensitive areas 06/12/23: Attempted PROM but pt was very guarded and had difficulty relaxing. Will attempt pulleys at beginning of next session. Cont MLD  as follows: In supine: Short neck, 5 diaphragmatic breaths, R axillary nodes and establishment of interaxillary pathway, L inguinal nodes and establishment of axilloinguinal pathway, then L breast moving fluid towards pathways spending extra time in any areas of fibrosis then retracing all steps.   06/10/23: Pt  described sequence to therapist that she has been completing at home. She had forgotten to create inter axillary pathway. MLD  as follows with therapist educating pt in every step: In supine: Short neck, 5 diaphragmatic breaths, R axillary nodes and establishment of interaxillary pathway, L inguinal nodes and establishment of axilloinguinal pathway, then L breast moving fluid towards pathways spending extra time in any areas of fibrosis then retracing all steps.   06/04/23: Instructed pt in self MLD today and had her return demonstrate each step with pt practicing for the first part of the session and therapist completing session as follows: In supine: Short neck, 5 diaphragmatic breaths, R axillary nodes and establishment of interaxillary pathway, L inguinal nodes and establishment of axilloinguinal pathway, then L breast moving fluid towards pathways spending extra time in any areas of fibrosis then retracing all steps.   06/02/2023 Educated pt about role of lymphatics, importance of stretch, activation of LN's etc and discussed techniques while performing In supine: Short neck, 5 diaphragmatic breaths, R axillary  nodes and establishment of interaxillary pathway, L inguinal nodes and establishment of axilloinguinal pathway, then L breast moving fluid towards pathways spending extra time in any areas of fibrosis then retracing all steps.   05/27/2023 Educated pt about role of lymphatics, importance of stretch, activation of LN's etc and discussed techniques while performing In supine: Short neck, 5 diaphragmatic breaths, R axillary nodes and establishment of interaxillary pathway, L inguinal nodes and establishment of axilloinguinal pathway, then L breast moving fluid towards pathways spending extra time in any areas of fibrosis then retracing all steps.     05/21/23: created foam chip pack for pt to wear in her bra for additional compression, issued script for compression bra    PATIENT  EDUCATION:  Education details: lymphedema, anatomy and physiology of the lymphatic system, how to manage breast lymphedema, compression bra Person educated: Patient Education method: Explanation Education comprehension: verbalized understanding  HOME EXERCISE PROGRAM: Wear chip pack as much as tolerable Obtain compression bra and wear as much as possible Self MLD to R breast  ASSESSMENT:  CLINICAL IMPRESSION: Attempted PROM today but pt very guarded so stopped. Will try AAROM next session to see if she is able to be more relaxed throughout. Continued MLD with focus on lateral breast where pt has increased fibrosis today with softening noted by end of session.   OBJECTIVE IMPAIRMENTS: decreased knowledge of condition, decreased knowledge of use of DME, decreased ROM, decreased strength, increased edema, increased fascial restrictions, impaired UE functional use, postural dysfunction, and pain.   ACTIVITY LIMITATIONS: carrying, lifting, sleeping, and reach over head  PARTICIPATION LIMITATIONS: cleaning, laundry, and community activity  PERSONAL FACTORS: Time since onset of injury/illness/exacerbation are also affecting patient's functional outcome.   REHAB POTENTIAL: Good  CLINICAL DECISION MAKING: Stable/uncomplicated  EVALUATION COMPLEXITY: Low  GOALS: Goals reviewed with patient? Yes  SHORT TERM GOALS=LONG TERM GOALS Target date: 06/18/23  Pt will be independent in self MLD for L breast for long term management of lymphedema. Baseline: Goal status: INITIAL  2.  Pt will obtain a compression bra for long term management of lymphedema. Baseline:  Goal status: INITIAL  3.  Pt will demonstrate 120 degrees of L shoulder flexion to allow imprved function. Baseline:  Goal status: INITIAL  4.  Pt will demonstrate 120 degrees of L shoulder abduction to allow improved function. Baseline:  Goal status: INITIAL  5.  Pt will be independent in a home exercise program for continued  strengthening and stretching.  Baseline:  Goal status: INITIAL  6.  Pt will report a 50% improvement in heaviness of L breast to allow improved comfort.  Baseline:  Goal status: INITIAL  PLAN:  PT FREQUENCY: 2x/week  PT DURATION: 4 weeks  PLANNED INTERVENTIONS: Therapeutic exercises, Therapeutic activity, Patient/Family education, Self Care, Joint mobilization, Orthotic/Fit training, Manual lymph drainage, Compression bandaging, scar mobilization, Taping, Vasopneumatic device, Manual therapy, and Re-evaluation  PLAN FOR NEXT SESSION: try pulleys, cont MLD to L breast and eventually instruct pt, did she get a compression bra?, PROM to L shoulder, scap mobs to L scap  [employer], PT 06/12/2023, 9:58 AM

## 2023-06-17 ENCOUNTER — Encounter: Payer: Self-pay | Admitting: Physical Therapy

## 2023-06-17 ENCOUNTER — Ambulatory Visit: Payer: Medicare Other | Attending: Hematology and Oncology | Admitting: Physical Therapy

## 2023-06-17 DIAGNOSIS — C50912 Malignant neoplasm of unspecified site of left female breast: Secondary | ICD-10-CM | POA: Insufficient documentation

## 2023-06-17 DIAGNOSIS — Z170 Estrogen receptor positive status [ER+]: Secondary | ICD-10-CM | POA: Diagnosis present

## 2023-06-17 DIAGNOSIS — L599 Disorder of the skin and subcutaneous tissue related to radiation, unspecified: Secondary | ICD-10-CM | POA: Diagnosis present

## 2023-06-17 DIAGNOSIS — I890 Lymphedema, not elsewhere classified: Secondary | ICD-10-CM | POA: Diagnosis present

## 2023-06-17 DIAGNOSIS — M25612 Stiffness of left shoulder, not elsewhere classified: Secondary | ICD-10-CM | POA: Diagnosis present

## 2023-06-17 DIAGNOSIS — R293 Abnormal posture: Secondary | ICD-10-CM | POA: Diagnosis present

## 2023-06-17 DIAGNOSIS — C50911 Malignant neoplasm of unspecified site of right female breast: Secondary | ICD-10-CM

## 2023-06-17 NOTE — Therapy (Addendum)
OUTPATIENT PHYSICAL THERAPY  UPPER EXTREMITY ONCOLOGY TREATMMENT  Patient Name: Connie Marshall MRN: [id number] DOB:[birth year], 77 y.o., female Today's Date: 06/17/2023  END OF SESSION:  PT End of Session - 06/17/23 0904     Visit Number 7    Number of Visits 17    Date for PT Re-Evaluation 07/22/23    PT Start Time 0903    PT Stop Time 0955    PT Time Calculation (min) 52 min    Activity Tolerance Patient tolerated treatment well    Behavior During Therapy WFL for tasks assessed/performed             Past Medical History:  Diagnosis Date   Axillary mass, left    Cancer (HCC)    Complication of anesthesia    Diverticulosis    Hemorrhoids    Hyperlipidemia    Hypertension    IBS (irritable bowel syndrome)    Obesity    PONV (postoperative nausea and vomiting)    Pre-diabetes    Psoriasis    Shingles 02/19/2021   Past Surgical History:  Procedure Laterality Date   BREAST LUMPECTOMY WITH RADIOACTIVE SEED AND SENTINEL LYMPH NODE BIOPSY Bilateral 10/05/2019   Procedure: BILATERAL BREAST LUMPECTOMY WITH BILATERAL RADIOACTIVE SEEDS AND LEFT SENTINEL LYMPH NODE BIOPSY;  Surgeon: Abigail Miyamoto, MD;  Location: [facility];  Service: General;  Laterality: Bilateral;   BREAST SURGERY     CESAREAN SECTION     2 times   CHOLECYSTECTOMY     laproscopic   CHOLECYSTECTOMY, LAPAROSCOPIC     kidney stone removed  10/14/2009   MASS EXCISION Left 07/12/2019   Procedure: EXCISION LEFT AXILLARY MASS;  Surgeon: Abigail Miyamoto, MD;  Location: [facility];  Service: General;  Laterality: Left;   RE-EXCISION OF BREAST LUMPECTOMY Left 10/05/2019   Procedure: RE-EXCISION OF LEFT BREAST CANCER;  Surgeon: Abigail Miyamoto, MD;  Location: [facility];  Service: General;  Laterality: Left;   THYROGLOSSAL DUCT CYST  10/14/1998   TONSILLECTOMY  10/14/1977   TOTAL HIP ARTHROPLASTY Right 05/14/2022   Procedure: RIGHT TOTAL HIP ARTHROPLASTY ANTERIOR  APPROACH;  Surgeon: Kathryne Hitch, MD;  Location: [facility];  Service: Orthopedics;  Laterality: Right;   umbilical herniorrhapy     VAGINAL HYSTERECTOMY  10/14/2001   Patient Active Problem List   Diagnosis Date Noted   Venous insufficiency 01/29/2023   Status post total replacement of right hip 05/14/2022   Unilateral primary osteoarthritis, right hip 03/27/2022   Preop exam for internal medicine 01/16/2022   Insect bite, infected 02/19/2021   Shingles outbreak 02/19/2021   Dysuria 05/16/2020   Right knee pain 05/16/2020   Carcinoma of upper-outer quadrant of left breast in female, estrogen receptor positive (HCC) 08/25/2019   Bilateral breast cancer (HCC) 08/17/2019   Skin nodule 05/11/2019   B12 deficiency 05/11/2019   Vitamin D deficiency 05/11/2019   Acute lymphadenitis 07/16/2017   Eustachian tube dysfunction, bilateral 07/16/2017   Prolapse of female bladder, acquired 03/25/2017   Sudden right hearing loss 03/25/2017   Left otitis media 03/25/2017   Rash 11/07/2015   Abnormal mammogram 09/13/2015   Blister of toe of left foot 04/13/2015   Breast discharge 04/13/2015   Encounter for well adult exam with abnormal findings 08/06/2011   Microscopic hematuria 04/27/2010   ANXIETY 03/25/2009   PARESTHESIA 03/23/2009   PERIPHERAL EDEMA 03/23/2009   HEMORRHOIDS, INTERNAL 02/13/2008   DIVERTICULOSIS, COLON 02/13/2008   IBS 02/13/2008   CHOLECYSTECTOMY, LAPAROSCOPIC, HX OF  02/13/2008   OBESITY NOS 07/07/2007   Diabetes (HCC) 07/06/2007   Hyperlipidemia 07/06/2007   Essential hypertension 07/06/2007    PCP: Oliver Barre, MD  REFERRING PROVIDER: Serena Croissant, MD  REFERRING DIAG: C50.911,Z17.0,C50.912 (ICD-10-CM) - Bilateral malignant neoplasm of breast in female, estrogen receptor positive, unspecified site of breast (HCC)   THERAPY DIAG:  Lymphedema, not elsewhere classified  Stiffness of left shoulder, not elsewhere classified  Disorder of the skin and  subcutaneous tissue related to radiation, unspecified  Abnormal posture  Bilateral malignant neoplasm of breast in female, estrogen receptor positive, unspecified site of breast [facility])  ONSET DATE: 07/2022  Rationale for Evaluation and Treatment: Rehabilitation  SUBJECTIVE:                                                                                                                                                                                           SUBJECTIVE STATEMENT:  The massage is going ok.   PERTINENT HISTORY: Hx of bilateral breast cancer- 10/05/19 R lumpectomy - intraductal papilloma no malignacy, L lumpectomy - fibrocystic change intraductal pailloma no malignacy idencidinfed, L breast re excision IDC grade 2, DCIS ER/PR+, HER2- 0/6 nodes Ki-67- 15%, completed radiation 12/22/2019. R THA 05/14/2022  PAIN:  Are you having pain? No   PRECAUTIONS: None  RED FLAGS: None   WEIGHT BEARING RESTRICTIONS: No  FALLS:  Has patient fallen in last 6 months? No  LIVING ENVIRONMENT: Lives with: lives alone Lives in: House/apartment Stairs: No;  Has following equipment at home: Single point cane, Environmental consultant - 2 wheeled, Environmental consultant - 4 wheeled, Wheelchair (manual), shower chair, and bed side commode  OCCUPATION: retired  LEISURE: [facility] [employer] [facility] to [facility]  HAND DOMINANCE: right   PRIOR LEVEL OF FUNCTION: Independent  PATIENT GOALS: to get the breast to go down   OBJECTIVE:  COGNITION: Overall cognitive status: Within functional limits for tasks assessed   PALPATION: L breast very fibrotic   OBSERVATIONS / OTHER ASSESSMENTS: peau d'orange texture, about 30-50% larger than R side  POSTURE: forward head, rounded shoulders  UPPER EXTREMITY AROM/PROM:  A/PROM RIGHT   eval   Shoulder extension 78  Shoulder flexion 143  Shoulder abduction 167  Shoulder internal rotation 59  Shoulder external rotation 60    (Blank rows = not tested)  A/PROM LEFT   eval  LEFT 06/17/23  Shoulder extension 75   Shoulder flexion 58 48  Shoulder abduction 64 61  Shoulder internal rotation Unable to assess    Shoulder external rotation Unable to assess     (Blank rows = not tested)  CERVICAL AROM: All within normal  limits:    Percent limited  Flexion   Extension   Right lateral flexion   Left lateral flexion   Right rotation   Left rotation      LYMPHEDEMA ASSESSMENTS:   LANDMARK RIGHT  eval  At axilla    15 cm proximal to olecranon process 33.5  10 cm proximal to olecranon process 32  Olecranon process 26.9  15 cm proximal to ulnar styloid process 24  10 cm proximal to ulnar styloid process 21  Just proximal to ulnar styloid process 17.2  Across hand at thumb web space 21.5  At base of 2nd digit 6.6  (Blank rows = not tested)  LANDMARK LEFT  eval  At axilla    15 cm proximal to olecranon process 35.4  10 cm proximal to olecranon process 31.8  Olecranon process 27  15 cm proximal to ulnar styloid process 23.9  10 cm proximal to ulnar styloid process 20.4  Just proximal to ulnar styloid process 17.4  Across hand at thumb web space 21.8  At base of 2nd digit 6.5  (Blank rows = not tested)      TODAY'S TREATMENT:                                                                                                                                          DATE:  Pt permission and consent throughout each step of examination and treatment with modification and draping if requested when working on sensitive areas 06/17/23: Assessed progress towards goals. Encouraged pt to see ortho doctor regarding shoulder.Will attempt pulleys at beginning of next session. In supine: Short neck, 5 diaphragmatic breaths, R axillary nodes and establishment of interaxillary pathway, L inguinal nodes and establishment of axilloinguinal pathway, then L breast moving fluid towards pathways spending extra time in any areas of fibrosis then retracing all steps.    06/12/23: Attempted PROM but pt was very guarded and had difficulty relaxing. Will attempt pulleys at beginning of next session. Cont MLD  as follows: In supine: Short neck, 5 diaphragmatic breaths, R axillary nodes and establishment of interaxillary pathway, L inguinal nodes and establishment of axilloinguinal pathway, then L breast moving fluid towards pathways spending extra time in any areas of fibrosis then retracing all steps.     06/10/23: Pt described sequence to therapist that she has been completing at home. She had forgotten to create inter axillary pathway. MLD  as follows with therapist educating pt in every step:In supine: Short neck, 5 diaphragmatic breaths, R axillary nodes and establishment of interaxillary pathway, L inguinal nodes and establishment of axilloinguinal pathway, then L breast moving fluid towards pathways spending extra time in any areas of fibrosis then retracing all steps.   06/04/23: Instructed pt in self MLD today and had her return demonstrate each step with pt practicing for the first part of the session and therapist completing session as follows:  In supine: Short neck, 5 diaphragmatic breaths, R axillary nodes and establishment of interaxillary pathway, L inguinal nodes and establishment of axilloinguinal pathway, then L breast moving fluid towards pathways spending extra time in any areas of fibrosis then retracing all steps.   06/02/2023 Educated pt about role of lymphatics, importance of stretch, activation of LN's etc and discussed techniques while performing In supine: Short neck, 5 diaphragmatic breaths, R axillary nodes and establishment of interaxillary pathway, L inguinal nodes and establishment of axilloinguinal pathway, then L breast moving fluid towards pathways spending extra time in any areas of fibrosis then retracing all steps.  05/27/2023 Educated pt about role of lymphatics, importance of stretch, activation of LN's etc and discussed  techniques while performing In supine: Short neck, 5 diaphragmatic breaths, R axillary nodes and establishment of interaxillary pathway, L inguinal nodes and establishment of axilloinguinal pathway, then L breast moving fluid towards pathways spending extra time in any areas of fibrosis then retracing all steps.    05/21/23: created foam chip pack for pt to wear in her bra for additional compression, issued script for compression bra    PATIENT EDUCATION:  Education details: lymphedema, anatomy and physiology of the lymphatic system, how to manage breast lymphedema, compression bra Person educated: Patient Education method: Explanation Education comprehension: verbalized understanding  HOME EXERCISE PROGRAM: Wear chip pack as much as tolerable Obtain compression bra and wear as much as possible Self MLD to R breast  ASSESSMENT:  CLINICAL IMPRESSION: Assessed pt's progress towards goals in therapy. She is now independent in MLD but continues to have fibrosis in inferior breast. The feelings of heaviness are improving but she still has not met this goal. Her ROM of her L shoulder is still very limited. Pt unable to tolerate PROM due to pain. Encouraged pt to see an orthopedic doctor regarding this to ensure it is safe to do PROM and find out why she is having so much pain. Pt would benefit from continued skilled PT services to continue to decrease L breast lymphedema and improve L shoulder PROM once cleared by ortho.   OBJECTIVE IMPAIRMENTS: decreased knowledge of condition, decreased knowledge of use of DME, decreased ROM, decreased strength, increased edema, increased fascial restrictions, impaired UE functional use, postural dysfunction, and pain.   ACTIVITY LIMITATIONS: carrying, lifting, sleeping, and reach over head  PARTICIPATION LIMITATIONS: cleaning, laundry, and community activity  PERSONAL FACTORS: Time since onset of injury/illness/exacerbation are also affecting patient's  functional outcome.   REHAB POTENTIAL: Good  CLINICAL DECISION MAKING: Stable/uncomplicated  EVALUATION COMPLEXITY: Low  GOALS: Goals reviewed with patient? Yes  SHORT TERM GOALS=LONG TERM GOALS Target date: 06/18/23  Pt will be independent in self MLD for L breast for long term management of lymphedema. Baseline: Goal status: 06/17/23- MET - pt feels independent with this  2.  Pt will obtain a compression bra for long term management of lymphedema. Baseline:  Goal status: MET 06/17/23 - has been wearing it  3.  Pt will demonstrate 120 degrees of L shoulder flexion to allow imprved function. Baseline:  Goal status: ONGOING 06/17/23  4.  Pt will demonstrate 120 degrees of L shoulder abduction to allow improved function. Baseline:  Goal status: ONGOING 06/17/23  5.  Pt will be independent in a home exercise program for continued strengthening and stretching.  Baseline:  Goal status: ONGOING  6.  Pt will report a 50% improvement in heaviness of L breast to allow improved comfort.  Baseline:  Goal status: 06/17/23: ONGOING -  25% improved  PLAN:  PT FREQUENCY: 2x/week  PT DURATION: other: 5 weeks  PLANNED INTERVENTIONS: Therapeutic exercises, Therapeutic activity, Patient/Family education, Self Care, Joint mobilization, Orthotic/Fit training, Manual lymph drainage, Compression bandaging, scar mobilization, Taping, Vasopneumatic device, Manual therapy, and Re-evaluation  PLAN FOR NEXT SESSION: try pulleys, cont MLD to L breast and eventually instruct pt, did she get a compression bra?, PROM to L shoulder, scap mobs to L scap  [employer], PT 06/17/2023, 10:18 AM

## 2023-06-19 ENCOUNTER — Ambulatory Visit: Payer: Medicare Other

## 2023-06-19 DIAGNOSIS — I890 Lymphedema, not elsewhere classified: Secondary | ICD-10-CM | POA: Diagnosis not present

## 2023-06-19 DIAGNOSIS — R293 Abnormal posture: Secondary | ICD-10-CM

## 2023-06-19 DIAGNOSIS — C50911 Malignant neoplasm of unspecified site of right female breast: Secondary | ICD-10-CM

## 2023-06-19 DIAGNOSIS — L599 Disorder of the skin and subcutaneous tissue related to radiation, unspecified: Secondary | ICD-10-CM

## 2023-06-19 DIAGNOSIS — M25612 Stiffness of left shoulder, not elsewhere classified: Secondary | ICD-10-CM

## 2023-06-19 NOTE — Therapy (Signed)
OUTPATIENT PHYSICAL THERAPY  UPPER EXTREMITY ONCOLOGY TREATMMENT  Patient Name: Connie Marshall MRN: [id number] DOB:13-Sep-1946, 77 y.o., female Today's Date: 06/19/2023  END OF SESSION:  PT End of Session - 06/19/23 0900     Visit Number 8    Number of Visits 17    Date for PT Re-Evaluation 07/22/23    PT Start Time 0901    PT Stop Time 0949    PT Time Calculation (min) 48 min    Activity Tolerance Patient tolerated treatment well    Behavior During Therapy WFL for tasks assessed/performed             Past Medical History:  Diagnosis Date   Axillary mass, left    Cancer (HCC)    Complication of anesthesia    Diverticulosis    Hemorrhoids    Hyperlipidemia    Hypertension    IBS (irritable bowel syndrome)    Obesity    PONV (postoperative nausea and vomiting)    Pre-diabetes    Psoriasis    Shingles 02/19/2021   Past Surgical History:  Procedure Laterality Date   BREAST LUMPECTOMY WITH RADIOACTIVE SEED AND SENTINEL LYMPH NODE BIOPSY Bilateral 10/05/2019   Procedure: BILATERAL BREAST LUMPECTOMY WITH BILATERAL RADIOACTIVE SEEDS AND LEFT SENTINEL LYMPH NODE BIOPSY;  Surgeon: Abigail Miyamoto, MD;  Location: [facility];  Service: General;  Laterality: Bilateral;   BREAST SURGERY     CESAREAN SECTION     2 times   CHOLECYSTECTOMY     laproscopic   CHOLECYSTECTOMY, LAPAROSCOPIC     kidney stone removed  10/14/2009   MASS EXCISION Left 07/12/2019   Procedure: EXCISION LEFT AXILLARY MASS;  Surgeon: Abigail Miyamoto, MD;  Location: [facility];  Service: General;  Laterality: Left;   RE-EXCISION OF BREAST LUMPECTOMY Left 10/05/2019   Procedure: RE-EXCISION OF LEFT BREAST CANCER;  Surgeon: Abigail Miyamoto, MD;  Location: [facility];  Service: General;  Laterality: Left;   THYROGLOSSAL DUCT CYST  10/14/1998   TONSILLECTOMY  10/14/1977   TOTAL HIP ARTHROPLASTY Right 05/14/2022   Procedure: RIGHT TOTAL HIP ARTHROPLASTY ANTERIOR  APPROACH;  Surgeon: Kathryne Hitch, MD;  Location: [facility];  Service: Orthopedics;  Laterality: Right;   umbilical herniorrhapy     VAGINAL HYSTERECTOMY  10/14/2001   Patient Active Problem List   Diagnosis Date Noted   Venous insufficiency 01/29/2023   Status post total replacement of right hip 05/14/2022   Unilateral primary osteoarthritis, right hip 03/27/2022   Preop exam for internal medicine 01/16/2022   Insect bite, infected 02/19/2021   Shingles outbreak 02/19/2021   Dysuria 05/16/2020   Right knee pain 05/16/2020   Carcinoma of upper-outer quadrant of left breast in female, estrogen receptor positive (HCC) 08/25/2019   Bilateral breast cancer (HCC) 08/17/2019   Skin nodule 05/11/2019   B12 deficiency 05/11/2019   Vitamin D deficiency 05/11/2019   Acute lymphadenitis 07/16/2017   Eustachian tube dysfunction, bilateral 07/16/2017   Prolapse of female bladder, acquired 03/25/2017   Sudden right hearing loss 03/25/2017   Left otitis media 03/25/2017   Rash 11/07/2015   Abnormal mammogram 09/13/2015   Blister of toe of left foot 04/13/2015   Breast discharge 04/13/2015   Encounter for well adult exam with abnormal findings 08/06/2011   Microscopic hematuria 04/27/2010   ANXIETY 03/25/2009   PARESTHESIA 03/23/2009   PERIPHERAL EDEMA 03/23/2009   HEMORRHOIDS, INTERNAL 02/13/2008   DIVERTICULOSIS, COLON 02/13/2008   IBS 02/13/2008   CHOLECYSTECTOMY, LAPAROSCOPIC, HX OF  02/13/2008   OBESITY NOS 07/07/2007   Diabetes (HCC) 07/06/2007   Hyperlipidemia 07/06/2007   Essential hypertension 07/06/2007    PCP: Oliver Barre, MD  REFERRING PROVIDER: Serena Croissant, MD  REFERRING DIAG: C50.911,Z17.0,C50.912 (ICD-10-CM) - Bilateral malignant neoplasm of breast in female, estrogen receptor positive, unspecified site of breast (HCC)   THERAPY DIAG:  Lymphedema, not elsewhere classified  Stiffness of left shoulder, not elsewhere classified  Disorder of the skin and  subcutaneous tissue related to radiation, unspecified  Abnormal posture  Bilateral malignant neoplasm of breast in female, estrogen receptor positive, unspecified site of breast [facility])  ONSET DATE: 07/2022  Rationale for Evaluation and Treatment: Rehabilitation  SUBJECTIVE:                                                                                                                                                                                           SUBJECTIVE STATEMENT:  The swelling is down some but I can still feel some hard places.. I am doing the MLD at home.   PERTINENT HISTORY: Hx of bilateral breast cancer- 10/05/19 R lumpectomy - intraductal papilloma no malignacy, L lumpectomy - fibrocystic change intraductal pailloma no malignacy idencidinfed, L breast re excision IDC grade 2, DCIS ER/PR+, HER2- 0/6 nodes Ki-67- 15%, completed radiation 12/22/2019. R THA 05/14/2022  PAIN:  Are you having pain? No   PRECAUTIONS: None  RED FLAGS: None   WEIGHT BEARING RESTRICTIONS: No  FALLS:  Has patient fallen in last 6 months? No  LIVING ENVIRONMENT: Lives with: lives alone Lives in: House/apartment Stairs: No;  Has following equipment at home: Single point cane, Environmental consultant - 2 wheeled, Environmental consultant - 4 wheeled, Wheelchair (manual), shower chair, and bed side commode  OCCUPATION: retired  LEISURE: [facility] prior to this  HAND DOMINANCE: right   PRIOR LEVEL OF FUNCTION: Independent  PATIENT GOALS: to get the breast to go down   OBJECTIVE:  COGNITION: Overall cognitive status: Within functional limits for tasks assessed   PALPATION: L breast very fibrotic   OBSERVATIONS / OTHER ASSESSMENTS: peau d'orange texture, about 30-50% larger than R side  POSTURE: forward head, rounded shoulders  UPPER EXTREMITY AROM/PROM:  A/PROM RIGHT   eval   Shoulder extension 78  Shoulder flexion 143  Shoulder abduction 167  Shoulder internal rotation 59  Shoulder external  rotation 60    (Blank rows = not tested)  A/PROM LEFT   eval LEFT 06/17/23  Shoulder extension 75   Shoulder flexion 58 48  Shoulder abduction 64 61  Shoulder internal rotation Unable to assess    Shoulder external rotation Unable to assess     (  Blank rows = not tested)  CERVICAL AROM: All within normal limits:    Percent limited  Flexion   Extension   Right lateral flexion   Left lateral flexion   Right rotation   Left rotation      LYMPHEDEMA ASSESSMENTS:   LANDMARK RIGHT  eval  At axilla    15 cm proximal to olecranon process 33.5  10 cm proximal to olecranon process 32  Olecranon process 26.9  15 cm proximal to ulnar styloid process 24  10 cm proximal to ulnar styloid process 21  Just proximal to ulnar styloid process 17.2  Across hand at thumb web space 21.5  At base of 2nd digit 6.6  (Blank rows = not tested)  LANDMARK LEFT  eval  At axilla    15 cm proximal to olecranon process 35.4  10 cm proximal to olecranon process 31.8  Olecranon process 27  15 cm proximal to ulnar styloid process 23.9  10 cm proximal to ulnar styloid process 20.4  Just proximal to ulnar styloid process 17.4  Across hand at thumb web space 21.8  At base of 2nd digit 6.5  (Blank rows = not tested)      TODAY'S TREATMENT:                                                                                                                                          DATE:  Pt permission and consent throughout each step of examination and treatment with modification and draping if requested when working on sensitive areas 06/19/2023 Pulleys x 15 reps on left for shoulder flexion and scaption with therapist helping pt keep shoulder depresssed Cont MLD to left Breast as follows: In supine: Short neck, 5 diaphragmatic breaths, R axillary nodes and establishment of interaxillary pathway, L inguinal nodes and establishment of axilloinguinal pathway, then L breast moving fluid towards pathways  spending extra time in any areas of fibrosis then retracing all steps. Pt practiced interaxillary pathway, axillo inguinal pathway and left medial breast with  VC's and TC's for pressure/technique  06/17/23: Assessed progress towards goals. Encouraged pt to see ortho doctor regarding shoulder.Will attempt pulleys at beginning of next session. Cont MLD  as follows: In supine: Short neck, 5 diaphragmatic breaths, L axillary nodes and establishment of interaxillary pathway, R inguinal nodes and establishment of axilloinguinal pathway, then R breast moving fluid towards pathways spending extra time in any areas of fibrosis then retracing all steps.   06/12/23: Attempted PROM but pt was very guarded and had difficulty relaxing. Will attempt pulleys at beginning of next session. Cont MLD  as follows: In supine: Short neck, 5 diaphragmatic breaths, L axillary nodes and establishment of interaxillary pathway, R inguinal nodes and establishment of axilloinguinal pathway, then R breast moving fluid towards pathways spending extra time in any areas of fibrosis then retracing all steps. Lateral breast more fibrotic today but  softened by end of session.    06/10/23: Pt described sequence to therapist that she has been completing at home. She had forgotten to create inter axillary pathway. MLD  as follows with therapist educating pt in every step: In supine: Short neck, 5 diaphragmatic breaths, L axillary nodes and establishment of interaxillary pathway, R inguinal nodes and establishment of axilloinguinal pathway, then R breast moving fluid towards pathways spending extra time in any areas of fibrosis then retracing all steps.  06/04/23: Instructed pt in self MLD today and had her return demonstrate each step with pt practicing for the first part of the session and therapist completing session as follows: In supine: Short neck, 5 diaphragmatic breaths, L axillary nodes and establishment of interaxillary pathway, R  inguinal nodes and establishment of axilloinguinal pathway, then R breast moving fluid towards pathways spending extra time in any areas of fibrosis then retracing all steps.  06/02/2023 Educated pt about role of lymphatics, importance of stretch, activation of LN's etc and discussed techniques while performing In supine: Short neck, 5 diaphragmatic breaths, R axillary nodes and establishment of interaxillary pathway, L inguinal nodes and establishment of axilloinguinal pathway, then L breast moving fluid towards pathways spending extra time in any areas of fibrosis then retracing all steps.  05/27/2023 Educated pt about role of lymphatics, importance of stretch, activation of LN's etc and discussed techniques while performing In supine: Short neck, 5 diaphragmatic breaths, R axillary nodes and establishment of interaxillary pathway, L inguinal nodes and establishment of axilloinguinal pathway, then L breast moving fluid towards pathways spending extra time in any areas of fibrosis then retracing all steps.    05/21/23: created foam chip pack for pt to wear in her bra for additional compression, issued script for compression bra    PATIENT EDUCATION:  Education details: lymphedema, anatomy and physiology of the lymphatic system, how to manage breast lymphedema, compression bra Person educated: Patient Education method: Explanation Education comprehension: verbalized understanding  HOME EXERCISE PROGRAM: Wear chip pack as much as tolerable Obtain compression bra and wear as much as possible Self MLD to R breast  ASSESSMENT:  CLINICAL IMPRESSION: Pt was able to do pulleys today without increased pain but with PT helping to prevent UT compensation. She improved her technique after practicing MLD today and area of fibrosis ar right inferior medial breast softened nicely.  OBJECTIVE IMPAIRMENTS: decreased knowledge of condition, decreased knowledge of use of DME, decreased ROM, decreased  strength, increased edema, increased fascial restrictions, impaired UE functional use, postural dysfunction, and pain.   ACTIVITY LIMITATIONS: carrying, lifting, sleeping, and reach over head  PARTICIPATION LIMITATIONS: cleaning, laundry, and community activity  PERSONAL FACTORS: Time since onset of injury/illness/exacerbation are also affecting patient's functional outcome.   REHAB POTENTIAL: Good  CLINICAL DECISION MAKING: Stable/uncomplicated  EVALUATION COMPLEXITY: Low  GOALS: Goals reviewed with patient? Yes  SHORT TERM GOALS=LONG TERM GOALS Target date: 06/18/23  Pt will be independent in self MLD for L breast for long term management of lymphedema. Baseline: Goal status: 06/17/23- MET - pt feels independent with this  2.  Pt will obtain a compression bra for long term management of lymphedema. Baseline:  Goal status: MET 06/17/23 - has been wearing it  3.  Pt will demonstrate 120 degrees of L shoulder flexion to allow imprved function. Baseline:  Goal status: ONGOING 06/17/23  4.  Pt will demonstrate 120 degrees of L shoulder abduction to allow improved function. Baseline:  Goal status: ONGOING 06/17/23  5.  Pt will  be independent in a home exercise program for continued strengthening and stretching.  Baseline:  Goal status: ONGOING  6.  Pt will report a 50% improvement in heaviness of L breast to allow improved comfort.  Baseline:  Goal status: 06/17/23: ONGOING - 25% improved  PLAN:  PT FREQUENCY: 2x/week  PT DURATION: other: 5 weeks  PLANNED INTERVENTIONS: Therapeutic exercises, Therapeutic activity, Patient/Family education, Self Care, Joint mobilization, Orthotic/Fit training, Manual lymph drainage, Compression bandaging, scar mobilization, Taping, Vasopneumatic device, Manual therapy, and Re-evaluation  PLAN FOR NEXT SESSION: try pulleys, cont MLD to L breast and continue to instruct pt, , PROM to L shoulder, scap mobs to L scap  Waynette Buttery, PT 06/19/2023,  9:54 AM

## 2023-06-25 ENCOUNTER — Ambulatory Visit (INDEPENDENT_AMBULATORY_CARE_PROVIDER_SITE_OTHER): Payer: Medicare Other | Admitting: Family Medicine

## 2023-06-25 ENCOUNTER — Other Ambulatory Visit: Payer: Self-pay

## 2023-06-25 ENCOUNTER — Ambulatory Visit: Payer: Medicare Other

## 2023-06-25 ENCOUNTER — Ambulatory Visit (INDEPENDENT_AMBULATORY_CARE_PROVIDER_SITE_OTHER): Payer: Medicare Other

## 2023-06-25 ENCOUNTER — Encounter: Payer: Self-pay | Admitting: Family Medicine

## 2023-06-25 VITALS — BP 110/72 | HR 70 | Ht 67.0 in | Wt 223.0 lb

## 2023-06-25 DIAGNOSIS — M25512 Pain in left shoulder: Secondary | ICD-10-CM | POA: Diagnosis not present

## 2023-06-25 DIAGNOSIS — I890 Lymphedema, not elsewhere classified: Secondary | ICD-10-CM

## 2023-06-25 DIAGNOSIS — R293 Abnormal posture: Secondary | ICD-10-CM

## 2023-06-25 DIAGNOSIS — M25612 Stiffness of left shoulder, not elsewhere classified: Secondary | ICD-10-CM

## 2023-06-25 DIAGNOSIS — G8929 Other chronic pain: Secondary | ICD-10-CM | POA: Diagnosis not present

## 2023-06-25 DIAGNOSIS — L599 Disorder of the skin and subcutaneous tissue related to radiation, unspecified: Secondary | ICD-10-CM

## 2023-06-25 DIAGNOSIS — C50911 Malignant neoplasm of unspecified site of right female breast: Secondary | ICD-10-CM

## 2023-06-25 NOTE — Progress Notes (Signed)
I, Stevenson Clinch, CMA acting as a scribe for Clementeen Graham, MD.  Connie Marshall is a 77 y.o. female who presents to [facility] today for L shoulder pain. Pt was previously seen by Dr. Denyse Amass on 05/22/20 for R shoulder/upper back and R hip pain.  Today, pt c/o L shoulder pain x 4 years, hx of lymph node removal. Pt locates pain to the left shoulder joint. Notes limited ROM. Denies radiating pain. Denies n/t in the arm or hand. Denies neck pain. Pt is RHD.   Neck pain: no Radiates: no Aggravates: lateral raise, overhead reaching Treatments tried: PT  Pertinent review of systems: No fevers or chills  Relevant historical information: Hypertension.  History of breast cancer with left lymph node dissection..   Exam:  BP 110/72   Pulse 70   Ht 5\' 7"  (1.702 m)   Wt 223 lb (101.2 kg)   SpO2 96%   BMI 34.93 kg/m  General: Well Developed, well nourished, and in no acute distress.   MSK: Left shoulder normal-appearing Active range of motion abduction limited to about 70 degrees.  External and internal rotation are intact. Strength reduced abduction.  Intact external and internal rotation.     Lab and Radiology Results  Diagnostic Limited MSK Ultrasound of: Left shoulder Biceps tendon is not visible concern for chronic biceps tear. Subscapularis tendon is difficult to visualize.  There may be a thin band of subscapularis present.  Concern for subscap tear. Supraspinatus tendon does appear to be at least partially present. Infraspinatus tendon appears to be intact. AC DJD is present. Mild shoulder effusion is present glenohumeral joint Impression: Chronic rotator cuff tear   X-ray images left shoulder obtained today personally and independently interpreted. Elevation of the humeral head compared to the glenoid concerning for chronic rotator cuff tear.  Mild glenohumeral DJD.  Moderate AC DJD.  No acute fractures are visible. Await formal radiology  review   Assessment and Plan: 77 y.o. female with left shoulder limited motion and weakness concern for chronic rotator cuff tear.  She is already had some physical therapy for shoulder mobility as well as for lymphedema reduction from breast cancer surgery.  Recommend continuing physical therapy.  We talked about potential glenohumeral injection which she declined today.  Recheck in 1 month if not better consider injection.  She notes that she would very much like to avoid a shoulder surgery.  I do not think getting an MRI in this context nasal out of sense.   PDMP not reviewed this encounter. Orders Placed This Encounter  Procedures   [place] LIMITED JOINT SPACE STRUCTURES UP LEFT(NO LINKED CHARGES)    Order Specific Question:   Reason for Exam (SYMPTOM  OR DIAGNOSIS REQUIRED)    Answer:   left shoulder pain    Order Specific Question:   Preferred imaging location?    Answer:   [facility] Shoulder Left    Standing Status:   Future    Number of Occurrences:   1    Standing Expiration Date:   07/25/2023    Order Specific Question:   Reason for Exam (SYMPTOM  OR DIAGNOSIS REQUIRED)    Answer:   left shoulder pain    Order Specific Question:   Preferred imaging location?    Answer:   [facility]   No orders of the defined types were placed in this encounter.    Discussed warning signs or symptoms. Please see  discharge instructions. Patient expresses understanding.   The above documentation has been reviewed and is accurate and complete Clementeen Graham, M.D.

## 2023-06-25 NOTE — Therapy (Signed)
OUTPATIENT PHYSICAL THERAPY  UPPER EXTREMITY ONCOLOGY TREATMMENT  Patient Name: Connie Marshall MRN: [id number] DOB:July 14, 1946, 77 y.o., female Today's Date: 06/25/2023  END OF SESSION:  PT End of Session - 06/25/23 0859     Visit Number 9    Number of Visits 17    Date for PT Re-Evaluation 07/22/23    PT Start Time 0901    PT Stop Time 0948    PT Time Calculation (min) 47 min    Activity Tolerance Patient tolerated treatment well    Behavior During Therapy WFL for tasks assessed/performed             Past Medical History:  Diagnosis Date   Axillary mass, left    Cancer (HCC)    Complication of anesthesia    Diverticulosis    Hemorrhoids    Hyperlipidemia    Hypertension    IBS (irritable bowel syndrome)    Obesity    PONV (postoperative nausea and vomiting)    Pre-diabetes    Psoriasis    Shingles 02/19/2021   Past Surgical History:  Procedure Laterality Date   BREAST LUMPECTOMY WITH RADIOACTIVE SEED AND SENTINEL LYMPH NODE BIOPSY Bilateral 10/05/2019   Procedure: BILATERAL BREAST LUMPECTOMY WITH BILATERAL RADIOACTIVE SEEDS AND LEFT SENTINEL LYMPH NODE BIOPSY;  Surgeon: Abigail Miyamoto, MD;  Location: [facility];  Service: General;  Laterality: Bilateral;   BREAST SURGERY     CESAREAN SECTION     2 times   CHOLECYSTECTOMY     laproscopic   CHOLECYSTECTOMY, LAPAROSCOPIC     kidney stone removed  10/14/2009   MASS EXCISION Left 07/12/2019   Procedure: EXCISION LEFT AXILLARY MASS;  Surgeon: Abigail Miyamoto, MD;  Location: [facility];  Service: General;  Laterality: Left;   RE-EXCISION OF BREAST LUMPECTOMY Left 10/05/2019   Procedure: RE-EXCISION OF LEFT BREAST CANCER;  Surgeon: Abigail Miyamoto, MD;  Location: [facility];  Service: General;  Laterality: Left;   THYROGLOSSAL DUCT CYST  10/14/1998   TONSILLECTOMY  10/14/1977   TOTAL HIP ARTHROPLASTY Right 05/14/2022   Procedure: RIGHT TOTAL HIP ARTHROPLASTY ANTERIOR  APPROACH;  Surgeon: Kathryne Hitch, MD;  Location: [facility];  Service: Orthopedics;  Laterality: Right;   umbilical herniorrhapy     VAGINAL HYSTERECTOMY  10/14/2001   Patient Active Problem List   Diagnosis Date Noted   Venous insufficiency 01/29/2023   Status post total replacement of right hip 05/14/2022   Unilateral primary osteoarthritis, right hip 03/27/2022   Preop exam for internal medicine 01/16/2022   Insect bite, infected 02/19/2021   Shingles outbreak 02/19/2021   Dysuria 05/16/2020   Right knee pain 05/16/2020   Carcinoma of upper-outer quadrant of left breast in female, estrogen receptor positive (HCC) 08/25/2019   Bilateral breast cancer (HCC) 08/17/2019   Skin nodule 05/11/2019   B12 deficiency 05/11/2019   Vitamin D deficiency 05/11/2019   Acute lymphadenitis 07/16/2017   Eustachian tube dysfunction, bilateral 07/16/2017   Prolapse of female bladder, acquired 03/25/2017   Sudden right hearing loss 03/25/2017   Left otitis media 03/25/2017   Rash 11/07/2015   Abnormal mammogram 09/13/2015   Blister of toe of left foot 04/13/2015   Breast discharge 04/13/2015   Encounter for well adult exam with abnormal findings 08/06/2011   Microscopic hematuria 04/27/2010   ANXIETY 03/25/2009   PARESTHESIA 03/23/2009   PERIPHERAL EDEMA 03/23/2009   HEMORRHOIDS, INTERNAL 02/13/2008   DIVERTICULOSIS, COLON 02/13/2008   IBS 02/13/2008   CHOLECYSTECTOMY, LAPAROSCOPIC, HX OF  02/13/2008   OBESITY NOS 07/07/2007   Diabetes (HCC) 07/06/2007   Hyperlipidemia 07/06/2007   Essential hypertension 07/06/2007    PCP: Oliver Barre, MD  REFERRING PROVIDER: Serena Croissant, MD  REFERRING DIAG: C50.911,Z17.0,C50.912 (ICD-10-CM) - Bilateral malignant neoplasm of breast in female, estrogen receptor positive, unspecified site of breast (HCC)   THERAPY DIAG:  Lymphedema, not elsewhere classified  Stiffness of left shoulder, not elsewhere classified  Disorder of the skin and  subcutaneous tissue related to radiation, unspecified  Abnormal posture  Bilateral malignant neoplasm of breast in female, estrogen receptor positive, unspecified site of breast [facility])  ONSET DATE: 07/2022  Rationale for Evaluation and Treatment: Rehabilitation  SUBJECTIVE:                                                                                                                                                                                           SUBJECTIVE STATEMENT:  Breast swelling is doing better. Its not sore at all. I massage the hard places and they get better. I go to the Sports medicine doctor today about my shoulder.   PERTINENT HISTORY: Hx of bilateral breast cancer- 10/05/19 R lumpectomy - intraductal papilloma no malignacy, L lumpectomy - fibrocystic change intraductal pailloma no malignacy idencidinfed, L breast re excision IDC grade 2, DCIS ER/PR+, HER2- 0/6 nodes Ki-67- 15%, completed radiation 12/22/2019. R THA 05/14/2022  PAIN:  Are you having pain? No   PRECAUTIONS: None  RED FLAGS: None   WEIGHT BEARING RESTRICTIONS: No  FALLS:  Has patient fallen in last 6 months? No  LIVING ENVIRONMENT: Lives with: lives alone Lives in: House/apartment Stairs: No;  Has following equipment at home: Single point cane, Environmental consultant - 2 wheeled, Environmental consultant - 4 wheeled, Wheelchair (manual), shower chair, and bed side commode  OCCUPATION: retired  LEISURE: [facility] [employer] prior to this  HAND DOMINANCE: right   PRIOR LEVEL OF FUNCTION: Independent  PATIENT GOALS: to get the breast to go down   OBJECTIVE:  COGNITION: Overall cognitive status: Within functional limits for tasks assessed   PALPATION: L breast very fibrotic   OBSERVATIONS / OTHER ASSESSMENTS: peau d'orange texture, about 30-50% larger than R side  POSTURE: forward head, rounded shoulders  UPPER EXTREMITY AROM/PROM:  A/PROM RIGHT   eval   Shoulder extension 78  Shoulder flexion 143   Shoulder abduction 167  Shoulder internal rotation 59  Shoulder external rotation 60    (Blank rows = not tested)  A/PROM LEFT   eval LEFT 06/17/23  Shoulder extension 75   Shoulder flexion 58 48  Shoulder abduction 64 61  Shoulder internal rotation Unable to  assess    Shoulder external rotation Unable to assess     (Blank rows = not tested)  CERVICAL AROM: All within normal limits:    Percent limited  Flexion   Extension   Right lateral flexion   Left lateral flexion   Right rotation   Left rotation      LYMPHEDEMA ASSESSMENTS:   LANDMARK RIGHT  eval  At axilla    15 cm proximal to olecranon process 33.5  10 cm proximal to olecranon process 32  Olecranon process 26.9  15 cm proximal to ulnar styloid process 24  10 cm proximal to ulnar styloid process 21  Just proximal to ulnar styloid process 17.2  Across hand at thumb web space 21.5  At base of 2nd digit 6.6  (Blank rows = not tested)  LANDMARK LEFT  eval  At axilla    15 cm proximal to olecranon process 35.4  10 cm proximal to olecranon process 31.8  Olecranon process 27  15 cm proximal to ulnar styloid process 23.9  10 cm proximal to ulnar styloid process 20.4  Just proximal to ulnar styloid process 17.4  Across hand at thumb web space 21.8  At base of 2nd digit 6.5  (Blank rows = not tested)      TODAY'S TREATMENT:                                                                                                                                          DATE:  Pt permission and consent throughout each step of examination and treatment with modification and draping if requested when working on sensitive areas 06/25/2023  Pulleys x 15-20 reps on left for shoulder flexion and scaption with therapist providing VC's and TC's prn to help pt keep shoulder depresssed Cont MLD to left Breast as follows: In supine: Short neck, 5 diaphragmatic breaths, R axillary nodes and establishment of interaxillary  pathway, L inguinal nodes and establishment of axilloinguinal pathway, then L breast moving fluid towards pathways spending extra time in any areas of fibrosis then retracing all steps. Pt practiced all techniques, including a short period of right SL to reach left axillo-inguinal pathway with  VC's and TC's for pressure/technique.  06/19/2023 Pulleys x 15 reps on left for shoulder flexion and scaption with therapist helping pt keep shoulder depresssed Cont MLD to left Breast as follows: In supine: Short neck, 5 diaphragmatic breaths, R axillary nodes and establishment of interaxillary pathway, L inguinal nodes and establishment of axilloinguinal pathway, then L breast moving fluid towards pathways spending extra time in any areas of fibrosis then retracing all steps. Pt practiced interaxillary pathway, axillo inguinal pathway and left medial breast with  VC's and TC's for pressure/technique  06/17/23: Assessed progress towards goals. Encouraged pt to see ortho doctor regarding shoulder.Will attempt pulleys at beginning of next session. Cont MLD  as follows: In supine: Short neck,  5 diaphragmatic breaths, L axillary nodes and establishment of interaxillary pathway, R inguinal nodes and establishment of axilloinguinal pathway, then R breast moving fluid towards pathways spending extra time in any areas of fibrosis then retracing all steps.   06/12/23: Attempted PROM but pt was very guarded and had difficulty relaxing. Will attempt pulleys at beginning of next session. Cont MLD  as follows: In supine: Short neck, 5 diaphragmatic breaths, L axillary nodes and establishment of interaxillary pathway, R inguinal nodes and establishment of axilloinguinal pathway, then R breast moving fluid towards pathways spending extra time in any areas of fibrosis then retracing all steps. Lateral breast more fibrotic today but softened by end of session.    06/10/23: Pt described sequence to therapist that she has been  completing at home. She had forgotten to create inter axillary pathway. MLD  as follows with therapist educating pt in every step: In supine: Short neck, 5 diaphragmatic breaths, L axillary nodes and establishment of interaxillary pathway, R inguinal nodes and establishment of axilloinguinal pathway, then R breast moving fluid towards pathways spending extra time in any areas of fibrosis then retracing all steps.  06/04/23: Instructed pt in self MLD today and had her return demonstrate each step with pt practicing for the first part of the session and therapist completing session as follows: In supine: Short neck, 5 diaphragmatic breaths, L axillary nodes and establishment of interaxillary pathway, R inguinal nodes and establishment of axilloinguinal pathway, then R breast moving fluid towards pathways spending extra time in any areas of fibrosis then retracing all steps.  06/02/2023 Educated pt about role of lymphatics, importance of stretch, activation of LN's etc and discussed techniques while performing In supine: Short neck, 5 diaphragmatic breaths, R axillary nodes and establishment of interaxillary pathway, L inguinal nodes and establishment of axilloinguinal pathway, then L breast moving fluid towards pathways spending extra time in any areas of fibrosis then retracing all steps.  05/27/2023 Educated pt about role of lymphatics, importance of stretch, activation of LN's etc and discussed techniques while performing In supine: Short neck, 5 diaphragmatic breaths, R axillary nodes and establishment of interaxillary pathway, L inguinal nodes and establishment of axilloinguinal pathway, then L breast moving fluid towards pathways spending extra time in any areas of fibrosis then retracing all steps.    05/21/23: created foam chip pack for pt to wear in her bra for additional compression, issued script for compression bra    PATIENT EDUCATION:  Education details: lymphedema, anatomy and physiology  of the lymphatic system, how to manage breast lymphedema, compression bra Person educated: Patient Education method: Explanation Education comprehension: verbalized understanding  HOME EXERCISE PROGRAM: Wear chip pack as much as tolerable Obtain compression bra and wear as much as possible Self MLD to R breast  ASSESSMENT:  CLINICAL IMPRESSION:   Reviewed use of pulleys again to be sure pt was doing correctly at home. She will try at home in sitting instead of standing. Pts technique improved after practice today. She does have difficulty reaching the anterior inguinal pathway on left and we tried for a few minutes in right SL. This was difficult also so she may try in sitting at home. Fibrotic areas do soften with practice. Seeing ortho MD today for left shoulder   OBJECTIVE IMPAIRMENTS: decreased knowledge of condition, decreased knowledge of use of DME, decreased ROM, decreased strength, increased edema, increased fascial restrictions, impaired UE functional use, postural dysfunction, and pain.   ACTIVITY LIMITATIONS: carrying, lifting, sleeping, and reach over head  PARTICIPATION LIMITATIONS: cleaning, laundry, and community activity  PERSONAL FACTORS: Time since onset of injury/illness/exacerbation are also affecting patient's functional outcome.   REHAB POTENTIAL: Good  CLINICAL DECISION MAKING: Stable/uncomplicated  EVALUATION COMPLEXITY: Low  GOALS: Goals reviewed with patient? Yes  SHORT TERM GOALS=LONG TERM GOALS Target date: 06/18/23  Pt will be independent in self MLD for L breast for long term management of lymphedema. Baseline: Goal status: 06/17/23- MET - pt feels independent with this  2.  Pt will obtain a compression bra for long term management of lymphedema. Baseline:  Goal status: MET 06/17/23 - has been wearing it  3.  Pt will demonstrate 120 degrees of L shoulder flexion to allow imprved function. Baseline:  Goal status: ONGOING 06/17/23  4.  Pt will  demonstrate 120 degrees of L shoulder abduction to allow improved function. Baseline:  Goal status: ONGOING 06/17/23  5.  Pt will be independent in a home exercise program for continued strengthening and stretching.  Baseline:  Goal status: ONGOING  6.  Pt will report a 50% improvement in heaviness of L breast to allow improved comfort.  Baseline:  Goal status: 06/17/23: ONGOING - 25% improved  PLAN:  PT FREQUENCY: 2x/week  PT DURATION: other: 5 weeks  PLANNED INTERVENTIONS: Therapeutic exercises, Therapeutic activity, Patient/Family education, Self Care, Joint mobilization, Orthotic/Fit training, Manual lymph drainage, Compression bandaging, scar mobilization, Taping, Vasopneumatic device, Manual therapy, and Re-evaluation  PLAN FOR NEXT SESSION: what did Ortho MD say about shoulder,try pulleys, cont MLD to L breast and continue to instruct pt, , PROM to L shoulder, scap mobs to L scap  Waynette Buttery, PT 06/25/2023, 9:57 AM

## 2023-06-25 NOTE — Patient Instructions (Signed)
Thank you for coming in today.   Please get an Xray today before you leave

## 2023-06-30 ENCOUNTER — Ambulatory Visit: Payer: Medicare Other | Admitting: Physical Therapy

## 2023-06-30 ENCOUNTER — Encounter: Payer: Self-pay | Admitting: Physical Therapy

## 2023-06-30 DIAGNOSIS — M25612 Stiffness of left shoulder, not elsewhere classified: Secondary | ICD-10-CM

## 2023-06-30 DIAGNOSIS — I890 Lymphedema, not elsewhere classified: Secondary | ICD-10-CM | POA: Diagnosis not present

## 2023-06-30 DIAGNOSIS — L599 Disorder of the skin and subcutaneous tissue related to radiation, unspecified: Secondary | ICD-10-CM

## 2023-06-30 DIAGNOSIS — C50911 Malignant neoplasm of unspecified site of right female breast: Secondary | ICD-10-CM

## 2023-06-30 DIAGNOSIS — R293 Abnormal posture: Secondary | ICD-10-CM

## 2023-06-30 NOTE — Therapy (Signed)
OUTPATIENT PHYSICAL THERAPY  UPPER EXTREMITY ONCOLOGY TREATMMENT  Patient Name: Connie Marshall MRN: [id number] DOB:[birth year], 77 y.o., female Today's Date: 06/30/2023  END OF SESSION:  PT End of Session - 06/30/23 0806     Visit Number 10    Number of Visits 17    Date for PT Re-Evaluation 07/22/23    PT Start Time 0803    PT Stop Time 0855    PT Time Calculation (min) 52 min    Activity Tolerance Patient tolerated treatment well    Behavior During Therapy WFL for tasks assessed/performed             Past Medical History:  Diagnosis Date   Axillary mass, left    Cancer (HCC)    Complication of anesthesia    Diverticulosis    Hemorrhoids    Hyperlipidemia    Hypertension    IBS (irritable bowel syndrome)    Obesity    PONV (postoperative nausea and vomiting)    Pre-diabetes    Psoriasis    Shingles 02/19/2021   Past Surgical History:  Procedure Laterality Date   BREAST LUMPECTOMY WITH RADIOACTIVE SEED AND SENTINEL LYMPH NODE BIOPSY Bilateral 10/05/2019   Procedure: BILATERAL BREAST LUMPECTOMY WITH BILATERAL RADIOACTIVE SEEDS AND LEFT SENTINEL LYMPH NODE BIOPSY;  Surgeon: Abigail Miyamoto, MD;  Location: [facility];  Service: General;  Laterality: Bilateral;   BREAST SURGERY     CESAREAN SECTION     2 times   CHOLECYSTECTOMY     laproscopic   CHOLECYSTECTOMY, LAPAROSCOPIC     kidney stone removed  10/14/2009   MASS EXCISION Left 07/12/2019   Procedure: EXCISION LEFT AXILLARY MASS;  Surgeon: Abigail Miyamoto, MD;  Location: [facility];  Service: General;  Laterality: Left;   RE-EXCISION OF BREAST LUMPECTOMY Left 10/05/2019   Procedure: RE-EXCISION OF LEFT BREAST CANCER;  Surgeon: Abigail Miyamoto, MD;  Location: [facility];  Service: General;  Laterality: Left;   THYROGLOSSAL DUCT CYST  10/14/1998   TONSILLECTOMY  10/14/1977   TOTAL HIP ARTHROPLASTY Right 05/14/2022   Procedure: RIGHT TOTAL HIP ARTHROPLASTY ANTERIOR  APPROACH;  Surgeon: Kathryne Hitch, MD;  Location: [facility];  Service: Orthopedics;  Laterality: Right;   umbilical herniorrhapy     VAGINAL HYSTERECTOMY  10/14/2001   Patient Active Problem List   Diagnosis Date Noted   Venous insufficiency 01/29/2023   Status post total replacement of right hip 05/14/2022   Unilateral primary osteoarthritis, right hip 03/27/2022   Preop exam for internal medicine 01/16/2022   Insect bite, infected 02/19/2021   Shingles outbreak 02/19/2021   Dysuria 05/16/2020   Right knee pain 05/16/2020   Carcinoma of upper-outer quadrant of left breast in female, estrogen receptor positive (HCC) 08/25/2019   Bilateral breast cancer (HCC) 08/17/2019   Skin nodule 05/11/2019   B12 deficiency 05/11/2019   Vitamin D deficiency 05/11/2019   Acute lymphadenitis 07/16/2017   Eustachian tube dysfunction, bilateral 07/16/2017   Prolapse of female bladder, acquired 03/25/2017   Sudden right hearing loss 03/25/2017   Left otitis media 03/25/2017   Rash 11/07/2015   Abnormal mammogram 09/13/2015   Blister of toe of left foot 04/13/2015   Breast discharge 04/13/2015   Encounter for well adult exam with abnormal findings 08/06/2011   Microscopic hematuria 04/27/2010   ANXIETY 03/25/2009   PARESTHESIA 03/23/2009   PERIPHERAL EDEMA 03/23/2009   HEMORRHOIDS, INTERNAL 02/13/2008   DIVERTICULOSIS, COLON 02/13/2008   IBS 02/13/2008   CHOLECYSTECTOMY, LAPAROSCOPIC, HX OF  02/13/2008   OBESITY NOS 07/07/2007   Diabetes (HCC) 07/06/2007   Hyperlipidemia 07/06/2007   Essential hypertension 07/06/2007    PCP: Oliver Barre, MD  REFERRING PROVIDER: Serena Croissant, MD  REFERRING DIAG: C50.911,Z17.0,C50.912 (ICD-10-CM) - Bilateral malignant neoplasm of breast in female, estrogen receptor positive, unspecified site of breast (HCC)   THERAPY DIAG:  Lymphedema, not elsewhere classified  Stiffness of left shoulder, not elsewhere classified  Disorder of the skin and  subcutaneous tissue related to radiation, unspecified  Abnormal posture  Bilateral malignant neoplasm of breast in female, estrogen receptor positive, unspecified site of breast [facility])  ONSET DATE: 07/2022  Rationale for Evaluation and Treatment: Rehabilitation  SUBJECTIVE:                                                                                                                                                                                           SUBJECTIVE STATEMENT:  I saw the doctor about my shoulder. I decided not to get an injection.   PERTINENT HISTORY: Hx of bilateral breast cancer- 10/05/19 R lumpectomy - intraductal papilloma no malignacy, L lumpectomy - fibrocystic change intraductal pailloma no malignacy idencidinfed, L breast re excision IDC grade 2, DCIS ER/PR+, HER2- 0/6 nodes Ki-67- 15%, completed radiation 12/22/2019. R THA 05/14/2022  PAIN:  Are you having pain? No   PRECAUTIONS: None  RED FLAGS: None   WEIGHT BEARING RESTRICTIONS: No  FALLS:  Has patient fallen in last 6 months? No  LIVING ENVIRONMENT: Lives with: lives alone Lives in: House/apartment Stairs: No;  Has following equipment at home: Single point cane, Environmental consultant - 2 wheeled, Environmental consultant - 4 wheeled, Wheelchair (manual), shower chair, and bed side commode  OCCUPATION: retired  LEISURE: [facility] prior to [facility]  HAND DOMINANCE: right   PRIOR LEVEL OF FUNCTION: Independent  PATIENT GOALS: to get the breast to go down   OBJECTIVE:  COGNITION: Overall cognitive status: Within functional limits for tasks assessed   PALPATION: L breast very fibrotic   OBSERVATIONS / OTHER ASSESSMENTS: peau d'orange texture, about 30-50% larger than R side  POSTURE: forward head, rounded shoulders  UPPER EXTREMITY AROM/PROM:  A/PROM RIGHT   eval   Shoulder extension 78  Shoulder flexion 143  Shoulder abduction 167  Shoulder internal rotation 59  Shoulder external rotation 60    (Blank  rows = not tested)  A/PROM LEFT   eval LEFT 06/17/23  Shoulder extension 75   Shoulder flexion 58 48  Shoulder abduction 64 61  Shoulder internal rotation Unable to assess    Shoulder external rotation Unable to assess     (Blank rows =  not tested)    LYMPHEDEMA ASSESSMENTS:   LANDMARK RIGHT  eval  At axilla    15 cm proximal to olecranon process 33.5  10 cm proximal to olecranon process 32  Olecranon process 26.9  15 cm proximal to ulnar styloid process 24  10 cm proximal to ulnar styloid process 21  Just proximal to ulnar styloid process 17.2  Across hand at thumb web space 21.5  At base of 2nd digit 6.6  (Blank rows = not tested)  LANDMARK LEFT  eval  At axilla    15 cm proximal to olecranon process 35.4  10 cm proximal to olecranon process 31.8  Olecranon process 27  15 cm proximal to ulnar styloid process 23.9  10 cm proximal to ulnar styloid process 20.4  Just proximal to ulnar styloid process 17.4  Across hand at thumb web space 21.8  At base of 2nd digit 6.5  (Blank rows = not tested)      TODAY'S TREATMENT:                                                                                                                                          DATE:  Pt permission and consent throughout each step of examination and treatment with modification and draping if requested when working on sensitive areas  06/30/23:  Pulleys x 2 min in direction of flexion and 2 min in direction of abduction with v/c to keep shoulder relaxed Attempted ball but there was increased pain so stopped In R sidelying with pillow under L hip: scapular mobilization in all directions with decreased motion noted in all directions In supine: PROM to L shoulder in direction of flexion, scaption and ER with v/c to keep relaxed with no increased pain  Gentle A-P joint mobs grade 3 with no increased pain but pt did feel tightness x 2 min Instructed pt in flexion and extension isometrics for L  shoulder at wall x 5 with 5 sec holds   06/25/2023 Pulleys x 15-20 reps on left for shoulder flexion and scaption with therapist providing VC's and TC's prn to help pt keep shoulder depresssed Cont MLD to left Breast as follows: In supine: Short neck, 5 diaphragmatic breaths, R axillary nodes and establishment of interaxillary pathway, L inguinal nodes and establishment of axilloinguinal pathway, then L breast moving fluid towards pathways spending extra time in any areas of fibrosis then retracing all steps. Pt practiced all techniques, including a short period of right SL to reach left axillo-inguinal pathway with  VC's and TC's for pressure/technique.  06/19/2023 Pulleys x 15 reps on left for shoulder flexion and scaption with therapist helping pt keep shoulder depresssed Cont MLD to left Breast as follows: In supine: Short neck, 5 diaphragmatic breaths, R axillary nodes and establishment of interaxillary pathway, L inguinal nodes and establishment of axilloinguinal pathway, then L breast moving fluid towards pathways spending  extra time in any areas of fibrosis then retracing all steps. Pt practiced interaxillary pathway, axillo inguinal pathway and left medial breast with  VC's and TC's for pressure/technique  06/17/23: Assessed progress towards goals. Encouraged pt to see ortho doctor regarding shoulder.Will attempt pulleys at beginning of next session. Cont MLD  as follows: In supine: Short neck, 5 diaphragmatic breaths, L axillary nodes and establishment of interaxillary pathway, R inguinal nodes and establishment of axilloinguinal pathway, then R breast moving fluid towards pathways spending extra time in any areas of fibrosis then retracing all steps.   06/12/23: Attempted PROM but pt was very guarded and had difficulty relaxing. Will attempt pulleys at beginning of next session. Cont MLD  as follows: In supine: Short neck, 5 diaphragmatic breaths, L axillary nodes and establishment of  interaxillary pathway, R inguinal nodes and establishment of axilloinguinal pathway, then R breast moving fluid towards pathways spending extra time in any areas of fibrosis then retracing all steps. Lateral breast more fibrotic today but softened by end of session.    06/10/23: Pt described sequence to therapist that she has been completing at home. She had forgotten to create inter axillary pathway. MLD  as follows with therapist educating pt in every step: In supine: Short neck, 5 diaphragmatic breaths, L axillary nodes and establishment of interaxillary pathway, R inguinal nodes and establishment of axilloinguinal pathway, then R breast moving fluid towards pathways spending extra time in any areas of fibrosis then retracing all steps.  06/04/23: Instructed pt in self MLD today and had her return demonstrate each step with pt practicing for the first part of the session and therapist completing session as follows: In supine: Short neck, 5 diaphragmatic breaths, L axillary nodes and establishment of interaxillary pathway, R inguinal nodes and establishment of axilloinguinal pathway, then R breast moving fluid towards pathways spending extra time in any areas of fibrosis then retracing all steps.  06/02/2023 Educated pt about role of lymphatics, importance of stretch, activation of LN's etc and discussed techniques while performing In supine: Short neck, 5 diaphragmatic breaths, R axillary nodes and establishment of interaxillary pathway, L inguinal nodes and establishment of axilloinguinal pathway, then L breast moving fluid towards pathways spending extra time in any areas of fibrosis then retracing all steps.  05/27/2023 Educated pt about role of lymphatics, importance of stretch, activation of LN's etc and discussed techniques while performing In supine: Short neck, 5 diaphragmatic breaths, R axillary nodes and establishment of interaxillary pathway, L inguinal nodes and establishment of  axilloinguinal pathway, then L breast moving fluid towards pathways spending extra time in any areas of fibrosis then retracing all steps.    05/21/23: created foam chip pack for pt to wear in her bra for additional compression, issued script for compression bra    PATIENT EDUCATION:  Education details: lymphedema, anatomy and physiology of the lymphatic system, how to manage breast lymphedema, compression bra Person educated: Patient Education method: Explanation Education comprehension: verbalized understanding  HOME EXERCISE PROGRAM: Wear chip pack as much as tolerable Obtain compression bra and wear as much as possible Self MLD to R breast Access Code: [email or web address] URL: [email or web address] Date: 06/30/2023 Prepared by: Leonette Most  Exercises - Isometric Shoulder Flexion at Wall  - 1 x daily - 7 x weekly - 1 sets - 5 reps - 5 sec hold - Standing Isometric Shoulder Extension with Doorway - Arm Bent  - 1 x daily - 7 x weekly - 1 sets - 5 reps - 5  sec hold - Seated Shoulder Flexion AAROM with Pulley Behind  - 1 x daily - 7 x weekly - min 2 - Seated Shoulder Abduction AAROM with Pulley Behind  - 1 x daily - 7 x weekly  ASSESSMENT:  CLINICAL IMPRESSION: Therapist messaged with Clementeen Graham, MD regarding recent x ray and visit for pt's decreased L shoulder ROM. He suspects multiple old rotator cuff tears. Pt declined an injection. Pt was cleared to begin ROM. Began AAROM exercises, PROM and gentle joint mobilizations. Pt tolerated all well and felt increased tightness. Instructed pt in isometrics for L shoulder to begin to do at home as well as the pulleys.    OBJECTIVE IMPAIRMENTS: decreased knowledge of condition, decreased knowledge of use of DME, decreased ROM, decreased strength, increased edema, increased fascial restrictions, impaired UE functional use, postural dysfunction, and pain.   ACTIVITY LIMITATIONS: carrying, lifting, sleeping, and reach over  head  PARTICIPATION LIMITATIONS: cleaning, laundry, and community activity  PERSONAL FACTORS: Time since onset of injury/illness/exacerbation are also affecting patient's functional outcome.   REHAB POTENTIAL: Good  CLINICAL DECISION MAKING: Stable/uncomplicated  EVALUATION COMPLEXITY: Low  GOALS: Goals reviewed with patient? Yes  SHORT TERM GOALS=LONG TERM GOALS Target date: 06/18/23  Pt will be independent in self MLD for L breast for long term management of lymphedema. Baseline: Goal status: 06/17/23- MET - pt feels independent with this  2.  Pt will obtain a compression bra for long term management of lymphedema. Baseline:  Goal status: MET 06/17/23 - has been wearing it  3.  Pt will demonstrate 120 degrees of L shoulder flexion to allow imprved function. Baseline:  Goal status: ONGOING 06/17/23  4.  Pt will demonstrate 120 degrees of L shoulder abduction to allow improved function. Baseline:  Goal status: ONGOING 06/17/23  5.  Pt will be independent in a home exercise program for continued strengthening and stretching.  Baseline:  Goal status: ONGOING  6.  Pt will report a 50% improvement in heaviness of L breast to allow improved comfort.  Baseline:  Goal status: 06/17/23: ONGOING - 25% improved  PLAN:  PT FREQUENCY: 2x/week  PT DURATION: other: 5 weeks  PLANNED INTERVENTIONS: Therapeutic exercises, Therapeutic activity, Patient/Family education, Self Care, Joint mobilization, Orthotic/Fit training, Manual lymph drainage, Compression bandaging, scar mobilization, Taping, Vasopneumatic device, Manual therapy, and Re-evaluation  PLAN FOR NEXT SESSION: pulleys, PROM to L shoulder, joint mobs L shoulder, how are isometrics? cont MLD to L breast and continue to instruct pt, , PROM to L shoulder, scap mobs to L scap  [facility], PT 06/30/2023, 9:03 AM

## 2023-07-02 ENCOUNTER — Encounter: Payer: Self-pay | Admitting: Physical Therapy

## 2023-07-02 ENCOUNTER — Ambulatory Visit: Payer: Medicare Other | Admitting: Physical Therapy

## 2023-07-02 DIAGNOSIS — L599 Disorder of the skin and subcutaneous tissue related to radiation, unspecified: Secondary | ICD-10-CM

## 2023-07-02 DIAGNOSIS — R293 Abnormal posture: Secondary | ICD-10-CM

## 2023-07-02 DIAGNOSIS — C50911 Malignant neoplasm of unspecified site of right female breast: Secondary | ICD-10-CM

## 2023-07-02 DIAGNOSIS — I890 Lymphedema, not elsewhere classified: Secondary | ICD-10-CM | POA: Diagnosis not present

## 2023-07-02 DIAGNOSIS — M25612 Stiffness of left shoulder, not elsewhere classified: Secondary | ICD-10-CM

## 2023-07-02 NOTE — Therapy (Signed)
OUTPATIENT PHYSICAL THERAPY  UPPER EXTREMITY ONCOLOGY TREATMMENT  Patient Name: Connie Marshall MRN: [id number] DOB:27-Sep-1946, 77 y.o., female Today's Date: 07/02/2023  END OF SESSION:  PT End of Session - 07/02/23 0903     Visit Number 11    Number of Visits 17    Date for PT Re-Evaluation 07/22/23    PT Start Time 0902    PT Stop Time 0946    PT Time Calculation (min) 44 min    Activity Tolerance Patient tolerated treatment well    Behavior During Therapy WFL for tasks assessed/performed             Past Medical History:  Diagnosis Date   Axillary mass, left    Cancer (HCC)    Complication of anesthesia    Diverticulosis    Hemorrhoids    Hyperlipidemia    Hypertension    IBS (irritable bowel syndrome)    Obesity    PONV (postoperative nausea and vomiting)    Pre-diabetes    Psoriasis    Shingles 02/19/2021   Past Surgical History:  Procedure Laterality Date   BREAST LUMPECTOMY WITH RADIOACTIVE SEED AND SENTINEL LYMPH NODE BIOPSY Bilateral 10/05/2019   Procedure: BILATERAL BREAST LUMPECTOMY WITH BILATERAL RADIOACTIVE SEEDS AND LEFT SENTINEL LYMPH NODE BIOPSY;  Surgeon: Abigail Miyamoto, MD;  Location: [facility];  Service: General;  Laterality: Bilateral;   BREAST SURGERY     CESAREAN SECTION     2 times   CHOLECYSTECTOMY     laproscopic   CHOLECYSTECTOMY, LAPAROSCOPIC     kidney stone removed  10/14/2009   MASS EXCISION Left 07/12/2019   Procedure: EXCISION LEFT AXILLARY MASS;  Surgeon: Abigail Miyamoto, MD;  Location: [facility];  Service: General;  Laterality: Left;   RE-EXCISION OF BREAST LUMPECTOMY Left 10/05/2019   Procedure: RE-EXCISION OF LEFT BREAST CANCER;  Surgeon: Abigail Miyamoto, MD;  Location: [facility];  Service: General;  Laterality: Left;   THYROGLOSSAL DUCT CYST  10/14/1998   TONSILLECTOMY  10/14/1977   TOTAL HIP ARTHROPLASTY Right 05/14/2022   Procedure: RIGHT TOTAL HIP ARTHROPLASTY ANTERIOR  APPROACH;  Surgeon: Kathryne Hitch, MD;  Location: [facility];  Service: Orthopedics;  Laterality: Right;   umbilical herniorrhapy     VAGINAL HYSTERECTOMY  10/14/2001   Patient Active Problem List   Diagnosis Date Noted   Venous insufficiency 01/29/2023   Status post total replacement of right hip 05/14/2022   Unilateral primary osteoarthritis, right hip 03/27/2022   Preop exam for internal medicine 01/16/2022   Insect bite, infected 02/19/2021   Shingles outbreak 02/19/2021   Dysuria 05/16/2020   Right knee pain 05/16/2020   Carcinoma of upper-outer quadrant of left breast in female, estrogen receptor positive (HCC) 08/25/2019   Bilateral breast cancer (HCC) 08/17/2019   Skin nodule 05/11/2019   B12 deficiency 05/11/2019   Vitamin D deficiency 05/11/2019   Acute lymphadenitis 07/16/2017   Eustachian tube dysfunction, bilateral 07/16/2017   Prolapse of female bladder, acquired 03/25/2017   Sudden right hearing loss 03/25/2017   Left otitis media 03/25/2017   Rash 11/07/2015   Abnormal mammogram 09/13/2015   Blister of toe of left foot 04/13/2015   Breast discharge 04/13/2015   Encounter for well adult exam with abnormal findings 08/06/2011   Microscopic hematuria 04/27/2010   ANXIETY 03/25/2009   PARESTHESIA 03/23/2009   PERIPHERAL EDEMA 03/23/2009   HEMORRHOIDS, INTERNAL 02/13/2008   DIVERTICULOSIS, COLON 02/13/2008   IBS 02/13/2008   CHOLECYSTECTOMY, LAPAROSCOPIC, HX OF  02/13/2008   OBESITY NOS 07/07/2007   Diabetes (HCC) 07/06/2007   Hyperlipidemia 07/06/2007   Essential hypertension 07/06/2007    PCP: Oliver Barre, MD  REFERRING PROVIDER: Serena Croissant, MD  REFERRING DIAG: C50.911,Z17.0,C50.912 (ICD-10-CM) - Bilateral malignant neoplasm of breast in female, estrogen receptor positive, unspecified site of breast (HCC)   THERAPY DIAG:  Stiffness of left shoulder, not elsewhere classified  Lymphedema, not elsewhere classified  Disorder of the skin and  subcutaneous tissue related to radiation, unspecified  Abnormal posture  Bilateral malignant neoplasm of breast in female, estrogen receptor positive, unspecified site of breast [facility])  ONSET DATE: 07/2022  Rationale for Evaluation and Treatment: Rehabilitation  SUBJECTIVE:                                                                                                                                                                                           SUBJECTIVE STATEMENT:  I felt like my shoulder was moving better. It is a little sore.   PERTINENT HISTORY: Hx of bilateral breast cancer- 10/05/19 R lumpectomy - intraductal papilloma no malignacy, L lumpectomy - fibrocystic change intraductal pailloma no malignacy idencidinfed, L breast re excision IDC grade 2, DCIS ER/PR+, HER2- 0/6 nodes Ki-67- 15%, completed radiation 12/22/2019. R THA 05/14/2022  PAIN:  Are you having pain? No just soreness in L UE  PRECAUTIONS: None  RED FLAGS: None   WEIGHT BEARING RESTRICTIONS: No  FALLS:  Has patient fallen in last 6 months? No  LIVING ENVIRONMENT: Lives with: lives alone Lives in: House/apartment Stairs: No;  Has following equipment at home: Single point cane, Environmental consultant - 2 wheeled, Environmental consultant - 4 wheeled, Wheelchair (manual), shower chair, and bed side commode  OCCUPATION: retired  LEISURE: [facility] [employer] prior to this  HAND DOMINANCE: right   PRIOR LEVEL OF FUNCTION: Independent  PATIENT GOALS: to get the breast to go down   OBJECTIVE:  COGNITION: Overall cognitive status: Within functional limits for tasks assessed   PALPATION: L breast very fibrotic   OBSERVATIONS / OTHER ASSESSMENTS: peau d'orange texture, about 30-50% larger than R side  POSTURE: forward head, rounded shoulders  UPPER EXTREMITY AROM/PROM:  A/PROM RIGHT   eval   Shoulder extension 78  Shoulder flexion 143  Shoulder abduction 167  Shoulder internal rotation 59  Shoulder external rotation  60    (Blank rows = not tested)  A/PROM LEFT   eval LEFT 06/17/23  Shoulder extension 75   Shoulder flexion 58 48  Shoulder abduction 64 61  Shoulder internal rotation Unable to assess    Shoulder external rotation Unable to assess     (  Blank rows = not tested)    LYMPHEDEMA ASSESSMENTS:   LANDMARK RIGHT  eval  At axilla    15 cm proximal to olecranon process 33.5  10 cm proximal to olecranon process 32  Olecranon process 26.9  15 cm proximal to ulnar styloid process 24  10 cm proximal to ulnar styloid process 21  Just proximal to ulnar styloid process 17.2  Across hand at thumb web space 21.5  At base of 2nd digit 6.6  (Blank rows = not tested)  LANDMARK LEFT  eval  At axilla    15 cm proximal to olecranon process 35.4  10 cm proximal to olecranon process 31.8  Olecranon process 27  15 cm proximal to ulnar styloid process 23.9  10 cm proximal to ulnar styloid process 20.4  Just proximal to ulnar styloid process 17.4  Across hand at thumb web space 21.8  At base of 2nd digit 6.5  (Blank rows = not tested)      TODAY'S TREATMENT:                                                                                                                                          DATE:  Pt permission and consent throughout each step of examination and treatment with modification and draping if requested when working on sensitive areas 07/02/23:  Pulleys x 2 min in direction of flexion and 2 min in direction of abduction with v/c to keep shoulder relaxed In R sidelying with pillow under L hip: scapular mobilization in all directions with decreased motion noted in all directions but improved with mobs In supine: PROM to L shoulder in direction of flexion, scaption, abduction and ER with v/c to keep relaxed with no increased pain  Gentle A-P joint mobs grade 3 with no increased pain but pt did feel tightness x 2 min Instructed pt in flexion, IR,  ER, and extension isometrics for L  shoulder at wall x 5 with 5 sec holds  Supine serratus punches x 10 reps with v/c for correct form  06/30/23:  Pulleys x 2 min in direction of flexion and 2 min in direction of abduction with v/c to keep shoulder relaxed Attempted ball but there was increased pain so stopped In R sidelying with pillow under L hip: scapular mobilization in all directions with decreased motion noted in all directions In supine: PROM to L shoulder in direction of flexion, scaption and ER with v/c to keep relaxed with no increased pain  Gentle A-P joint mobs grade 3 with no increased pain but pt did feel tightness x 2 min Instructed pt in flexion and extension isometrics for L shoulder at wall x 5 with 5 sec holds   06/25/2023 Pulleys x 15-20 reps on left for shoulder flexion and scaption with therapist providing VC's and TC's prn to help pt keep shoulder depresssed Cont MLD to left Breast  as follows: In supine: Short neck, 5 diaphragmatic breaths, R axillary nodes and establishment of interaxillary pathway, L inguinal nodes and establishment of axilloinguinal pathway, then L breast moving fluid towards pathways spending extra time in any areas of fibrosis then retracing all steps. Pt practiced all techniques, including a short period of right SL to reach left axillo-inguinal pathway with  VC's and TC's for pressure/technique.  06/19/2023 Pulleys x 15 reps on left for shoulder flexion and scaption with therapist helping pt keep shoulder depresssed Cont MLD to left Breast as follows: In supine: Short neck, 5 diaphragmatic breaths, R axillary nodes and establishment of interaxillary pathway, L inguinal nodes and establishment of axilloinguinal pathway, then L breast moving fluid towards pathways spending extra time in any areas of fibrosis then retracing all steps. Pt practiced interaxillary pathway, axillo inguinal pathway and left medial breast with  VC's and TC's for pressure/technique  06/17/23: Assessed progress towards  goals. Encouraged pt to see ortho doctor regarding shoulder.Will attempt pulleys at beginning of next session. Cont MLD  as follows: In supine: Short neck, 5 diaphragmatic breaths, L axillary nodes and establishment of interaxillary pathway, R inguinal nodes and establishment of axilloinguinal pathway, then R breast moving fluid towards pathways spending extra time in any areas of fibrosis then retracing all steps.   06/12/23: Attempted PROM but pt was very guarded and had difficulty relaxing. Will attempt pulleys at beginning of next session. Cont MLD  as follows: In supine: Short neck, 5 diaphragmatic breaths, L axillary nodes and establishment of interaxillary pathway, R inguinal nodes and establishment of axilloinguinal pathway, then R breast moving fluid towards pathways spending extra time in any areas of fibrosis then retracing all steps. Lateral breast more fibrotic today but softened by end of session.    06/10/23: Pt described sequence to therapist that she has been completing at home. She had forgotten to create inter axillary pathway. MLD  as follows with therapist educating pt in every step: In supine: Short neck, 5 diaphragmatic breaths, L axillary nodes and establishment of interaxillary pathway, R inguinal nodes and establishment of axilloinguinal pathway, then R breast moving fluid towards pathways spending extra time in any areas of fibrosis then retracing all steps.  06/04/23: Instructed pt in self MLD today and had her return demonstrate each step with pt practicing for the first part of the session and therapist completing session as follows: In supine: Short neck, 5 diaphragmatic breaths, L axillary nodes and establishment of interaxillary pathway, R inguinal nodes and establishment of axilloinguinal pathway, then R breast moving fluid towards pathways spending extra time in any areas of fibrosis then retracing all steps.  06/02/2023 Educated pt about role of lymphatics, importance  of stretch, activation of LN's etc and discussed techniques while performing In supine: Short neck, 5 diaphragmatic breaths, R axillary nodes and establishment of interaxillary pathway, L inguinal nodes and establishment of axilloinguinal pathway, then L breast moving fluid towards pathways spending extra time in any areas of fibrosis then retracing all steps.  05/27/2023 Educated pt about role of lymphatics, importance of stretch, activation of LN's etc and discussed techniques while performing In supine: Short neck, 5 diaphragmatic breaths, R axillary nodes and establishment of interaxillary pathway, L inguinal nodes and establishment of axilloinguinal pathway, then L breast moving fluid towards pathways spending extra time in any areas of fibrosis then retracing all steps.    05/21/23: created foam chip pack for pt to wear in her bra for additional compression, issued script for compression  bra    PATIENT EDUCATION:  Education details: lymphedema, anatomy and physiology of the lymphatic system, how to manage breast lymphedema, compression bra Person educated: Patient Education method: Explanation Education comprehension: verbalized understanding  HOME EXERCISE PROGRAM: Wear chip pack as much as tolerable Obtain compression bra and wear as much as possible Self MLD to R breast Access Code: [email or web address] URL: [email or web address] Date: 07/02/2023 Prepared by: Leonette Most  Exercises - Isometric Shoulder Flexion at Wall  - 1 x daily - 7 x weekly - 1 sets - 5 reps - 5 sec hold - Standing Isometric Shoulder Extension with Doorway - Arm Bent (Mirrored)  - 1 x daily - 7 x weekly - 1 sets - 5 reps - 5 sec hold - Seated Shoulder Flexion AAROM with Pulley Behind  - 1 x daily - 7 x weekly - min 2 - Seated Shoulder Abduction AAROM with Pulley Behind  - 1 x daily - 7 x weekly - Standing Isometric Shoulder Internal Rotation at Doorway (Mirrored)  - 1 x daily - 7 x weekly - 1  sets - 5 reps - 5 sec hold - Standing Isometric Shoulder External Rotation with Doorway  - 1 x daily - 7 x weekly - 1 sets - 5 reps - 5 sec hold - Supine Single Arm Scapular Protraction  - 1 x daily - 7 x weekly - 1 sets - 10 reps  ASSESSMENT:  CLINICAL IMPRESSION: Pt reports she did not have increased pain after last session but did feel sore today. She could tell her motion had improved some after last session when she was getting dressed. Added additional isometrics to pt's HEP and serratus punches today. Continued to focus on improving scapular mobility to help improve ROM. She is progressing with her PROM but still is very limited with AROM.    OBJECTIVE IMPAIRMENTS: decreased knowledge of condition, decreased knowledge of use of DME, decreased ROM, decreased strength, increased edema, increased fascial restrictions, impaired UE functional use, postural dysfunction, and pain.   ACTIVITY LIMITATIONS: carrying, lifting, sleeping, and reach over head  PARTICIPATION LIMITATIONS: cleaning, laundry, and community activity  PERSONAL FACTORS: Time since onset of injury/illness/exacerbation are also affecting patient's functional outcome.   REHAB POTENTIAL: Good  CLINICAL DECISION MAKING: Stable/uncomplicated  EVALUATION COMPLEXITY: Low  GOALS: Goals reviewed with patient? Yes  SHORT TERM GOALS=LONG TERM GOALS Target date: 06/18/23  Pt will be independent in self MLD for L breast for long term management of lymphedema. Baseline: Goal status: 06/17/23- MET - pt feels independent with this  2.  Pt will obtain a compression bra for long term management of lymphedema. Baseline:  Goal status: MET 06/17/23 - has been wearing it  3.  Pt will demonstrate 120 degrees of L shoulder flexion to allow imprved function. Baseline:  Goal status: ONGOING 06/17/23  4.  Pt will demonstrate 120 degrees of L shoulder abduction to allow improved function. Baseline:  Goal status: ONGOING 06/17/23  5.  Pt  will be independent in a home exercise program for continued strengthening and stretching.  Baseline:  Goal status: ONGOING  6.  Pt will report a 50% improvement in heaviness of L breast to allow improved comfort.  Baseline:  Goal status: 06/17/23: ONGOING - 25% improved  PLAN:  PT FREQUENCY: 2x/week  PT DURATION: other: 5 weeks  PLANNED INTERVENTIONS: Therapeutic exercises, Therapeutic activity, Patient/Family education, Self Care, Joint mobilization, Orthotic/Fit training, Manual lymph drainage, Compression bandaging, scar mobilization, Taping, Vasopneumatic device, Manual therapy, and Re-evaluation  PLAN FOR NEXT SESSION: pulleys, PROM to L shoulder, joint mobs L shoulder, how are isometrics? cont MLD to L breast and continue to instruct pt, , PROM to L shoulder, scap mobs to L scap  [facility], PT 07/02/2023, 9:56 AM

## 2023-07-07 NOTE — Progress Notes (Signed)
Left shoulder x-ray shows arthritis of the main shoulder joint and evidence of a chronic rotator cuff tear.

## 2023-07-08 ENCOUNTER — Ambulatory Visit: Payer: Medicare Other | Admitting: Physical Therapy

## 2023-07-08 ENCOUNTER — Encounter: Payer: Self-pay | Admitting: Physical Therapy

## 2023-07-08 DIAGNOSIS — L599 Disorder of the skin and subcutaneous tissue related to radiation, unspecified: Secondary | ICD-10-CM

## 2023-07-08 DIAGNOSIS — I890 Lymphedema, not elsewhere classified: Secondary | ICD-10-CM | POA: Diagnosis not present

## 2023-07-08 DIAGNOSIS — R293 Abnormal posture: Secondary | ICD-10-CM

## 2023-07-08 DIAGNOSIS — M25612 Stiffness of left shoulder, not elsewhere classified: Secondary | ICD-10-CM

## 2023-07-08 DIAGNOSIS — C50911 Malignant neoplasm of unspecified site of right female breast: Secondary | ICD-10-CM

## 2023-07-08 NOTE — Therapy (Signed)
OUTPATIENT PHYSICAL THERAPY  UPPER EXTREMITY ONCOLOGY TREATMMENT  Patient Name: Connie Marshall MRN: [id number] DOB:12/24/1945, 77 y.o., female Today's Date: 07/08/2023  END OF SESSION:  PT End of Session - 07/08/23 0906     Visit Number 12    Number of Visits 17    Date for PT Re-Evaluation 07/22/23    PT Start Time 0904    PT Stop Time 0950    PT Time Calculation (min) 46 min    Activity Tolerance Patient tolerated treatment well    Behavior During Therapy WFL for tasks assessed/performed             Past Medical History:  Diagnosis Date   Axillary mass, left    Cancer (HCC)    Complication of anesthesia    Diverticulosis    Hemorrhoids    Hyperlipidemia    Hypertension    IBS (irritable bowel syndrome)    Obesity    PONV (postoperative nausea and vomiting)    Pre-diabetes    Psoriasis    Shingles 02/19/2021   Past Surgical History:  Procedure Laterality Date   BREAST LUMPECTOMY WITH RADIOACTIVE SEED AND SENTINEL LYMPH NODE BIOPSY Bilateral 10/05/2019   Procedure: BILATERAL BREAST LUMPECTOMY WITH BILATERAL RADIOACTIVE SEEDS AND LEFT SENTINEL LYMPH NODE BIOPSY;  Surgeon: Abigail Miyamoto, MD;  Location: [facility];  Service: General;  Laterality: Bilateral;   BREAST SURGERY     CESAREAN SECTION     2 times   CHOLECYSTECTOMY     laproscopic   CHOLECYSTECTOMY, LAPAROSCOPIC     kidney stone removed  10/14/2009   MASS EXCISION Left 07/12/2019   Procedure: EXCISION LEFT AXILLARY MASS;  Surgeon: Abigail Miyamoto, MD;  Location: [facility];  Service: General;  Laterality: Left;   RE-EXCISION OF BREAST LUMPECTOMY Left 10/05/2019   Procedure: RE-EXCISION OF LEFT BREAST CANCER;  Surgeon: Abigail Miyamoto, MD;  Location: [facility];  Service: General;  Laterality: Left;   THYROGLOSSAL DUCT CYST  10/14/1998   TONSILLECTOMY  10/14/1977   TOTAL HIP ARTHROPLASTY Right 05/14/2022   Procedure: RIGHT TOTAL HIP ARTHROPLASTY ANTERIOR  APPROACH;  Surgeon: Kathryne Hitch, MD;  Location: [facility];  Service: Orthopedics;  Laterality: Right;   umbilical herniorrhapy     VAGINAL HYSTERECTOMY  10/14/2001   Patient Active Problem List   Diagnosis Date Noted   Venous insufficiency 01/29/2023   Status post total replacement of right hip 05/14/2022   Unilateral primary osteoarthritis, right hip 03/27/2022   Preop exam for internal medicine 01/16/2022   Insect bite, infected 02/19/2021   Shingles outbreak 02/19/2021   Dysuria 05/16/2020   Right knee pain 05/16/2020   Carcinoma of upper-outer quadrant of left breast in female, estrogen receptor positive (HCC) 08/25/2019   Bilateral breast cancer (HCC) 08/17/2019   Skin nodule 05/11/2019   B12 deficiency 05/11/2019   Vitamin D deficiency 05/11/2019   Acute lymphadenitis 07/16/2017   Eustachian tube dysfunction, bilateral 07/16/2017   Prolapse of female bladder, acquired 03/25/2017   Sudden right hearing loss 03/25/2017   Left otitis media 03/25/2017   Rash 11/07/2015   Abnormal mammogram 09/13/2015   Blister of toe of left foot 04/13/2015   Breast discharge 04/13/2015   Encounter for well adult exam with abnormal findings 08/06/2011   Microscopic hematuria 04/27/2010   ANXIETY 03/25/2009   PARESTHESIA 03/23/2009   PERIPHERAL EDEMA 03/23/2009   HEMORRHOIDS, INTERNAL 02/13/2008   DIVERTICULOSIS, COLON 02/13/2008   IBS 02/13/2008   CHOLECYSTECTOMY, LAPAROSCOPIC, HX OF  02/13/2008   OBESITY NOS 07/07/2007   Diabetes (HCC) 07/06/2007   Hyperlipidemia 07/06/2007   Essential hypertension 07/06/2007    PCP: Oliver Barre, MD  REFERRING PROVIDER: Serena Croissant, MD  REFERRING DIAG: C50.911,Z17.0,C50.912 (ICD-10-CM) - Bilateral malignant neoplasm of breast in female, estrogen receptor positive, unspecified site of breast (HCC)   THERAPY DIAG:  Stiffness of left shoulder, not elsewhere classified  Lymphedema, not elsewhere classified  Disorder of the skin and  subcutaneous tissue related to radiation, unspecified  Abnormal posture  Bilateral malignant neoplasm of breast in female, estrogen receptor positive, unspecified site of breast [facility])  ONSET DATE: 07/2022  Rationale for Evaluation and Treatment: Rehabilitation  SUBJECTIVE:                                                                                                                                                                                           SUBJECTIVE STATEMENT:  I feel like my shoulder is doing a little better. I could tell a difference when driving.   PERTINENT HISTORY: Hx of bilateral breast cancer- 10/05/19 R lumpectomy - intraductal papilloma no malignacy, L lumpectomy - fibrocystic change intraductal pailloma no malignacy idencidinfed, L breast re excision IDC grade 2, DCIS ER/PR+, HER2- 0/6 nodes Ki-67- 15%, completed radiation 12/22/2019. R THA 05/14/2022  PAIN:  Are you having pain? No   PRECAUTIONS: None  RED FLAGS: None   WEIGHT BEARING RESTRICTIONS: No  FALLS:  Has patient fallen in last 6 months? No  LIVING ENVIRONMENT: Lives with: lives alone Lives in: House/apartment Stairs: No;  Has following equipment at home: Single point cane, Environmental consultant - 2 wheeled, Environmental consultant - 4 wheeled, Wheelchair (manual), shower chair, and bed side commode  OCCUPATION: retired  LEISURE: [facility] [employer] prior to this  HAND DOMINANCE: right   PRIOR LEVEL OF FUNCTION: Independent  PATIENT GOALS: to get the breast to go down   OBJECTIVE:  COGNITION: Overall cognitive status: Within functional limits for tasks assessed   PALPATION: L breast very fibrotic   OBSERVATIONS / OTHER ASSESSMENTS: peau d'orange texture, about 30-50% larger than R side  POSTURE: forward head, rounded shoulders  UPPER EXTREMITY AROM/PROM:  A/PROM RIGHT   eval   Shoulder extension 78  Shoulder flexion 143  Shoulder abduction 167  Shoulder internal rotation 59  Shoulder external  rotation 60    (Blank rows = not tested)  A/PROM LEFT   eval LEFT 06/17/23  Shoulder extension 75   Shoulder flexion 58 48  Shoulder abduction 64 61  Shoulder internal rotation Unable to assess    Shoulder external rotation Unable to assess     (  Blank rows = not tested)    LYMPHEDEMA ASSESSMENTS:   LANDMARK RIGHT  eval  At axilla    15 cm proximal to olecranon process 33.5  10 cm proximal to olecranon process 32  Olecranon process 26.9  15 cm proximal to ulnar styloid process 24  10 cm proximal to ulnar styloid process 21  Just proximal to ulnar styloid process 17.2  Across hand at thumb web space 21.5  At base of 2nd digit 6.6  (Blank rows = not tested)  LANDMARK LEFT  eval  At axilla    15 cm proximal to olecranon process 35.4  10 cm proximal to olecranon process 31.8  Olecranon process 27  15 cm proximal to ulnar styloid process 23.9  10 cm proximal to ulnar styloid process 20.4  Just proximal to ulnar styloid process 17.4  Across hand at thumb web space 21.8  At base of 2nd digit 6.5  (Blank rows = not tested)      TODAY'S TREATMENT:                                                                                                                                          DATE:  Pt permission and consent throughout each step of examination and treatment with modification and draping if requested when working on sensitive areas 07/08/23: Pulleys x 2 min in direction of flexion and 2 min in direction of scaption with v/c for posture Dual cable machine with 3 lbs resistance and 10 reps of each of the following with pt returning therapist demo for resisted walkouts: flexion (L), extension (L), ER (L) and scapular retraction (bilaterally) all with v/c for proper form and positioning of elbow Scapular stabilization with ball on wall doing 10 circles in clockwise direction and 10 counter clockwise with v/c for scapular depression throughout In supine: PROM to L shoulder  in direction of flexion, scaption, abduction and ER with v/c to keep relaxed with no increased pain  Gentle A-P joint mobs grade 3 with no increased pain but pt did feel tightness x 2 min Supine dowel flexion x 10 reps with v/c to keep shoulder relaxed Attempted AROM in sitting but pt unable to do flexion or abduction without compensation and increased pain  07/02/23:  Pulleys x 2 min in direction of flexion and 2 min in direction of abduction with v/c to keep shoulder relaxed In R sidelying with pillow under L hip: scapular mobilization in all directions with decreased motion noted in all directions but improved with mobs In supine: PROM to L shoulder in direction of flexion, scaption, abduction and ER with v/c to keep relaxed with no increased pain  Gentle A-P joint mobs grade 3 with no increased pain but pt did feel tightness x 2 min Instructed pt in flexion, IR,  ER, and extension isometrics for L shoulder at wall x 5 with 5 sec  holds  Supine serratus punches x 10 reps with v/c for correct form  06/30/23:  Pulleys x 2 min in direction of flexion and 2 min in direction of abduction with v/c to keep shoulder relaxed Attempted ball but there was increased pain so stopped In R sidelying with pillow under L hip: scapular mobilization in all directions with decreased motion noted in all directions In supine: PROM to L shoulder in direction of flexion, scaption and ER with v/c to keep relaxed with no increased pain  Gentle A-P joint mobs grade 3 with no increased pain but pt did feel tightness x 2 min Instructed pt in flexion and extension isometrics for L shoulder at wall x 5 with 5 sec holds   06/25/2023 Pulleys x 15-20 reps on left for shoulder flexion and scaption with therapist providing VC's and TC's prn to help pt keep shoulder depresssed Cont MLD to left Breast as follows: In supine: Short neck, 5 diaphragmatic breaths, R axillary nodes and establishment of interaxillary pathway, L inguinal  nodes and establishment of axilloinguinal pathway, then L breast moving fluid towards pathways spending extra time in any areas of fibrosis then retracing all steps. Pt practiced all techniques, including a short period of right SL to reach left axillo-inguinal pathway with  VC's and TC's for pressure/technique.  06/19/2023 Pulleys x 15 reps on left for shoulder flexion and scaption with therapist helping pt keep shoulder depresssed Cont MLD to left Breast as follows: In supine: Short neck, 5 diaphragmatic breaths, R axillary nodes and establishment of interaxillary pathway, L inguinal nodes and establishment of axilloinguinal pathway, then L breast moving fluid towards pathways spending extra time in any areas of fibrosis then retracing all steps. Pt practiced interaxillary pathway, axillo inguinal pathway and left medial breast with  VC's and TC's for pressure/technique  06/17/23: Assessed progress towards goals. Encouraged pt to see ortho doctor regarding shoulder.Will attempt pulleys at beginning of next session. Cont MLD  as follows: In supine: Short neck, 5 diaphragmatic breaths, L axillary nodes and establishment of interaxillary pathway, R inguinal nodes and establishment of axilloinguinal pathway, then R breast moving fluid towards pathways spending extra time in any areas of fibrosis then retracing all steps.   06/12/23: Attempted PROM but pt was very guarded and had difficulty relaxing. Will attempt pulleys at beginning of next session. Cont MLD  as follows: In supine: Short neck, 5 diaphragmatic breaths, L axillary nodes and establishment of interaxillary pathway, R inguinal nodes and establishment of axilloinguinal pathway, then R breast moving fluid towards pathways spending extra time in any areas of fibrosis then retracing all steps. Lateral breast more fibrotic today but softened by end of session.    06/10/23: Pt described sequence to therapist that she has been completing at home. She  had forgotten to create inter axillary pathway. MLD  as follows with therapist educating pt in every step: In supine: Short neck, 5 diaphragmatic breaths, L axillary nodes and establishment of interaxillary pathway, R inguinal nodes and establishment of axilloinguinal pathway, then R breast moving fluid towards pathways spending extra time in any areas of fibrosis then retracing all steps.  06/04/23: Instructed pt in self MLD today and had her return demonstrate each step with pt practicing for the first part of the session and therapist completing session as follows: In supine: Short neck, 5 diaphragmatic breaths, L axillary nodes and establishment of interaxillary pathway, R inguinal nodes and establishment of axilloinguinal pathway, then R breast moving fluid towards pathways spending  extra time in any areas of fibrosis then retracing all steps.  06/02/2023 Educated pt about role of lymphatics, importance of stretch, activation of LN's etc and discussed techniques while performing In supine: Short neck, 5 diaphragmatic breaths, R axillary nodes and establishment of interaxillary pathway, L inguinal nodes and establishment of axilloinguinal pathway, then L breast moving fluid towards pathways spending extra time in any areas of fibrosis then retracing all steps.  05/27/2023 Educated pt about role of lymphatics, importance of stretch, activation of LN's etc and discussed techniques while performing In supine: Short neck, 5 diaphragmatic breaths, R axillary nodes and establishment of interaxillary pathway, L inguinal nodes and establishment of axilloinguinal pathway, then L breast moving fluid towards pathways spending extra time in any areas of fibrosis then retracing all steps.    05/21/23: created foam chip pack for pt to wear in her bra for additional compression, issued script for compression bra    PATIENT EDUCATION:  Education details: lymphedema, anatomy and physiology of the lymphatic system,  how to manage breast lymphedema, compression bra Person educated: Patient Education method: Explanation Education comprehension: verbalized understanding  HOME EXERCISE PROGRAM: Wear chip pack as much as tolerable Obtain compression bra and wear as much as possible Self MLD to R breast Access Code: [email or web address] URL: [email or web address] Date: 07/02/2023 Prepared by: Leonette Most  Exercises - Isometric Shoulder Flexion at Wall  - 1 x daily - 7 x weekly - 1 sets - 5 reps - 5 sec hold - Standing Isometric Shoulder Extension with Doorway - Arm Bent (Mirrored)  - 1 x daily - 7 x weekly - 1 sets - 5 reps - 5 sec hold - Seated Shoulder Flexion AAROM with Pulley Behind  - 1 x daily - 7 x weekly - min 2 - Seated Shoulder Abduction AAROM with Pulley Behind  - 1 x daily - 7 x weekly - Standing Isometric Shoulder Internal Rotation at Doorway (Mirrored)  - 1 x daily - 7 x weekly - 1 sets - 5 reps - 5 sec hold - Standing Isometric Shoulder External Rotation with Doorway  - 1 x daily - 7 x weekly - 1 sets - 5 reps - 5 sec hold - Supine Single Arm Scapular Protraction  - 1 x daily - 7 x weekly - 1 sets - 10 reps  ASSESSMENT:  CLINICAL IMPRESSION: Pt still having increased difficulty with AROM. She continues to demonstrate compensation and immediate hiking of L shoulder with all AROM. She did excellent with supine AAROM exercises. Began isometric walkouts today with resistance and pt did well with these while feeling challenged. Continued to educate on posture and decreasing compensatory strategies.   OBJECTIVE IMPAIRMENTS: decreased knowledge of condition, decreased knowledge of use of DME, decreased ROM, decreased strength, increased edema, increased fascial restrictions, impaired UE functional use, postural dysfunction, and pain.   ACTIVITY LIMITATIONS: carrying, lifting, sleeping, and reach over head  PARTICIPATION LIMITATIONS: cleaning, laundry, and community  activity  PERSONAL FACTORS: Time since onset of injury/illness/exacerbation are also affecting patient's functional outcome.   REHAB POTENTIAL: Good  CLINICAL DECISION MAKING: Stable/uncomplicated  EVALUATION COMPLEXITY: Low  GOALS: Goals reviewed with patient? Yes  SHORT TERM GOALS=LONG TERM GOALS Target date: 06/18/23  Pt will be independent in self MLD for L breast for long term management of lymphedema. Baseline: Goal status: 06/17/23- MET - pt feels independent with this  2.  Pt will obtain a compression bra for long term management of lymphedema. Baseline:  Goal status: MET 06/17/23 -  has been wearing it  3.  Pt will demonstrate 120 degrees of L shoulder flexion to allow imprved function. Baseline:  Goal status: ONGOING 06/17/23  4.  Pt will demonstrate 120 degrees of L shoulder abduction to allow improved function. Baseline:  Goal status: ONGOING 06/17/23  5.  Pt will be independent in a home exercise program for continued strengthening and stretching.  Baseline:  Goal status: ONGOING  6.  Pt will report a 50% improvement in heaviness of L breast to allow improved comfort.  Baseline:  Goal status: 06/17/23: ONGOING - 25% improved  PLAN:  PT FREQUENCY: 2x/week  PT DURATION: other: 5 weeks  PLANNED INTERVENTIONS: Therapeutic exercises, Therapeutic activity, Patient/Family education, Self Care, Joint mobilization, Orthotic/Fit training, Manual lymph drainage, Compression bandaging, scar mobilization, Taping, Vasopneumatic device, Manual therapy, and Re-evaluation  PLAN FOR NEXT SESSION: pulleys, PROM to L shoulder, joint mobs L shoulder, how are isometrics? cont MLD to L breast and continue to instruct pt, , PROM to L shoulder, scap mobs to L scap  [facility], PT 07/08/2023, 9:53 AM

## 2023-07-10 ENCOUNTER — Ambulatory Visit: Payer: Medicare Other | Admitting: Physical Therapy

## 2023-07-10 ENCOUNTER — Encounter: Payer: Self-pay | Admitting: Physical Therapy

## 2023-07-10 DIAGNOSIS — R293 Abnormal posture: Secondary | ICD-10-CM

## 2023-07-10 DIAGNOSIS — I890 Lymphedema, not elsewhere classified: Secondary | ICD-10-CM | POA: Diagnosis not present

## 2023-07-10 DIAGNOSIS — C50911 Malignant neoplasm of unspecified site of right female breast: Secondary | ICD-10-CM

## 2023-07-10 DIAGNOSIS — M25612 Stiffness of left shoulder, not elsewhere classified: Secondary | ICD-10-CM

## 2023-07-10 DIAGNOSIS — L599 Disorder of the skin and subcutaneous tissue related to radiation, unspecified: Secondary | ICD-10-CM

## 2023-07-10 NOTE — Therapy (Signed)
OUTPATIENT PHYSICAL THERAPY  UPPER EXTREMITY ONCOLOGY TREATMMENT  Patient Name: Connie Marshall MRN: [id number] DOB:[birth year], 77 y.o., female Today's Date: 07/10/2023  END OF SESSION:  PT End of Session - 07/10/23 0854     Visit Number 13    Number of Visits 17    Date for PT Re-Evaluation 07/22/23    PT Start Time 0805    PT Stop Time 0851    PT Time Calculation (min) 46 min    Activity Tolerance Patient tolerated treatment well    Behavior During Therapy WFL for tasks assessed/performed              Past Medical History:  Diagnosis Date   Axillary mass, left    Cancer (HCC)    Complication of anesthesia    Diverticulosis    Hemorrhoids    Hyperlipidemia    Hypertension    IBS (irritable bowel syndrome)    Obesity    PONV (postoperative nausea and vomiting)    Pre-diabetes    Psoriasis    Shingles 02/19/2021   Past Surgical History:  Procedure Laterality Date   BREAST LUMPECTOMY WITH RADIOACTIVE SEED AND SENTINEL LYMPH NODE BIOPSY Bilateral 10/05/2019   Procedure: BILATERAL BREAST LUMPECTOMY WITH BILATERAL RADIOACTIVE SEEDS AND LEFT SENTINEL LYMPH NODE BIOPSY;  Surgeon: Abigail Miyamoto, MD;  Location: [facility];  Service: General;  Laterality: Bilateral;   BREAST SURGERY     CESAREAN SECTION     2 times   CHOLECYSTECTOMY     laproscopic   CHOLECYSTECTOMY, LAPAROSCOPIC     kidney stone removed  10/14/2009   MASS EXCISION Left 07/12/2019   Procedure: EXCISION LEFT AXILLARY MASS;  Surgeon: Abigail Miyamoto, MD;  Location: [facility];  Service: General;  Laterality: Left;   RE-EXCISION OF BREAST LUMPECTOMY Left 10/05/2019   Procedure: RE-EXCISION OF LEFT BREAST CANCER;  Surgeon: Abigail Miyamoto, MD;  Location: [facility];  Service: General;  Laterality: Left;   THYROGLOSSAL DUCT CYST  10/14/1998   TONSILLECTOMY  10/14/1977   TOTAL HIP ARTHROPLASTY Right 05/14/2022   Procedure: RIGHT TOTAL HIP ARTHROPLASTY ANTERIOR  APPROACH;  Surgeon: Kathryne Hitch, MD;  Location: [facility];  Service: Orthopedics;  Laterality: Right;   umbilical herniorrhapy     VAGINAL HYSTERECTOMY  10/14/2001   Patient Active Problem List   Diagnosis Date Noted   Venous insufficiency 01/29/2023   Status post total replacement of right hip 05/14/2022   Unilateral primary osteoarthritis, right hip 03/27/2022   Preop exam for internal medicine 01/16/2022   Insect bite, infected 02/19/2021   Shingles outbreak 02/19/2021   Dysuria 05/16/2020   Right knee pain 05/16/2020   Carcinoma of upper-outer quadrant of left breast in female, estrogen receptor positive (HCC) 08/25/2019   Bilateral breast cancer (HCC) 08/17/2019   Skin nodule 05/11/2019   B12 deficiency 05/11/2019   Vitamin D deficiency 05/11/2019   Acute lymphadenitis 07/16/2017   Eustachian tube dysfunction, bilateral 07/16/2017   Prolapse of female bladder, acquired 03/25/2017   Sudden right hearing loss 03/25/2017   Left otitis media 03/25/2017   Rash 11/07/2015   Abnormal mammogram 09/13/2015   Blister of toe of left foot 04/13/2015   Breast discharge 04/13/2015   Encounter for well adult exam with abnormal findings 08/06/2011   Microscopic hematuria 04/27/2010   ANXIETY 03/25/2009   PARESTHESIA 03/23/2009   PERIPHERAL EDEMA 03/23/2009   HEMORRHOIDS, INTERNAL 02/13/2008   DIVERTICULOSIS, COLON 02/13/2008   IBS 02/13/2008   CHOLECYSTECTOMY, LAPAROSCOPIC, HX  OF 02/13/2008   OBESITY NOS 07/07/2007   Diabetes (HCC) 07/06/2007   Hyperlipidemia 07/06/2007   Essential hypertension 07/06/2007    PCP: Oliver Barre, MD  REFERRING PROVIDER: Serena Croissant, MD  REFERRING DIAG: C50.911,Z17.0,C50.912 (ICD-10-CM) - Bilateral malignant neoplasm of breast in female, estrogen receptor positive, unspecified site of breast (HCC)   THERAPY DIAG:  Stiffness of left shoulder, not elsewhere classified  Lymphedema, not elsewhere classified  Disorder of the skin and  subcutaneous tissue related to radiation, unspecified  Abnormal posture  Bilateral malignant neoplasm of breast in female, estrogen receptor positive, unspecified site of breast [facility])  ONSET DATE: 07/2022  Rationale for Evaluation and Treatment: Rehabilitation  SUBJECTIVE:                                                                                                                                                                                           SUBJECTIVE STATEMENT:  I can tell I can lift my arm up higher on the steering wheel.   PERTINENT HISTORY: Hx of bilateral breast cancer- 10/05/19 R lumpectomy - intraductal papilloma no malignacy, L lumpectomy - fibrocystic change intraductal pailloma no malignacy idencidinfed, L breast re excision IDC grade 2, DCIS ER/PR+, HER2- 0/6 nodes Ki-67- 15%, completed radiation 12/22/2019. R THA 05/14/2022  PAIN:  Are you having pain? No   PRECAUTIONS: None  RED FLAGS: None   WEIGHT BEARING RESTRICTIONS: No  FALLS:  Has patient fallen in last 6 months? No  LIVING ENVIRONMENT: Lives with: lives alone Lives in: House/apartment Stairs: No;  Has following equipment at home: Single point cane, Environmental consultant - 2 wheeled, Environmental consultant - 4 wheeled, Wheelchair (manual), shower chair, and bed side commode  OCCUPATION: retired  LEISURE: [facility] [employer] prior to this  HAND DOMINANCE: right   PRIOR LEVEL OF FUNCTION: Independent  PATIENT GOALS: to get the breast to go down   OBJECTIVE:  COGNITION: Overall cognitive status: Within functional limits for tasks assessed   PALPATION: L breast very fibrotic   OBSERVATIONS / OTHER ASSESSMENTS: peau d'orange texture, about 30-50% larger than R side  POSTURE: forward head, rounded shoulders  UPPER EXTREMITY AROM/PROM:  A/PROM RIGHT   eval   Shoulder extension 78  Shoulder flexion 143  Shoulder abduction 167  Shoulder internal rotation 59  Shoulder external rotation 60    (Blank rows =  not tested)  A/PROM LEFT   eval LEFT 06/17/23  Shoulder extension 75   Shoulder flexion 58 48  Shoulder abduction 64 61  Shoulder internal rotation Unable to assess    Shoulder external rotation Unable to assess     (Blank  rows = not tested)    LYMPHEDEMA ASSESSMENTS:   LANDMARK RIGHT  eval  At axilla    15 cm proximal to olecranon process 33.5  10 cm proximal to olecranon process 32  Olecranon process 26.9  15 cm proximal to ulnar styloid process 24  10 cm proximal to ulnar styloid process 21  Just proximal to ulnar styloid process 17.2  Across hand at thumb web space 21.5  At base of 2nd digit 6.6  (Blank rows = not tested)  LANDMARK LEFT  eval  At axilla    15 cm proximal to olecranon process 35.4  10 cm proximal to olecranon process 31.8  Olecranon process 27  15 cm proximal to ulnar styloid process 23.9  10 cm proximal to ulnar styloid process 20.4  Just proximal to ulnar styloid process 17.4  Across hand at thumb web space 21.8  At base of 2nd digit 6.5  (Blank rows = not tested)      TODAY'S TREATMENT:                                                                                                                                          DATE:  Pt permission and consent throughout each step of examination and treatment with modification and draping if requested when working on sensitive areas 07/10/23: Pulleys x 2 min in direction of flexion and 2 min in direction of scaption with v/c for posture Dual cable machine with 3 lbs resistance and 10 reps of each of the following with pt returning therapist demo: flexion  walkouts (L), extension (L), ER walkouts with v/c to keep elbow at side (L) and scapular retraction (bilaterally) all with v/c for proper form and positioning of elbow Scapular stabilization with ball on wall doing 10 circles in clockwise direction and 10 counter clockwise with v/c for scapular depression throughout - very difficult for pt In R  sidelying with pillow under L hip: scapular mobilization in all directions, AAROM in to flexion x 10 with therapist assisting with scapular depression throughout, AAROM abduction with no increased pain with this and better form x 10 In supine: PROM to L shoulder in direction of flexion, scaption, and abduction with v/c to keep relaxed with no increased pain  STM to upper traps, lats and levator on L to help decrease muscle tension Instructed pt in using a tennis ball in stockinette for self soft tissue mobilization  07/08/23: Pulleys x 2 min in direction of flexion and 2 min in direction of scaption with v/c for posture Dual cable machine with 3 lbs resistance and 10 reps of each of the following with pt returning therapist demo for resisted walkouts: flexion (L), extension (L), ER (L) and scapular retraction (bilaterally) all with v/c for proper form and positioning of elbow Scapular stabilization with ball on wall doing 10 circles in clockwise direction and 10 counter clockwise  with v/c for scapular depression throughout In supine: PROM to L shoulder in direction of flexion, scaption, abduction and ER with v/c to keep relaxed with no increased pain  Gentle A-P joint mobs grade 3 with no increased pain but pt did feel tightness x 2 min Supine dowel flexion x 10 reps with v/c to keep shoulder relaxed Attempted AROM in sitting but pt unable to do flexion or abduction without compensation and increased pain  07/02/23:  Pulleys x 2 min in direction of flexion and 2 min in direction of abduction with v/c to keep shoulder relaxed In R sidelying with pillow under L hip: scapular mobilization in all directions with decreased motion noted in all directions but improved with mobs In supine: PROM to L shoulder in direction of flexion, scaption, abduction and ER with v/c to keep relaxed with no increased pain  Gentle A-P joint mobs grade 3 with no increased pain but pt did feel tightness x 2 min Instructed pt  in flexion, IR,  ER, and extension isometrics for L shoulder at wall x 5 with 5 sec holds  Supine serratus punches x 10 reps with v/c for correct form  06/30/23:  Pulleys x 2 min in direction of flexion and 2 min in direction of abduction with v/c to keep shoulder relaxed Attempted ball but there was increased pain so stopped In R sidelying with pillow under L hip: scapular mobilization in all directions with decreased motion noted in all directions In supine: PROM to L shoulder in direction of flexion, scaption and ER with v/c to keep relaxed with no increased pain  Gentle A-P joint mobs grade 3 with no increased pain but pt did feel tightness x 2 min Instructed pt in flexion and extension isometrics for L shoulder at wall x 5 with 5 sec holds   06/25/2023 Pulleys x 15-20 reps on left for shoulder flexion and scaption with therapist providing VC's and TC's prn to help pt keep shoulder depresssed Cont MLD to left Breast as follows: In supine: Short neck, 5 diaphragmatic breaths, R axillary nodes and establishment of interaxillary pathway, L inguinal nodes and establishment of axilloinguinal pathway, then L breast moving fluid towards pathways spending extra time in any areas of fibrosis then retracing all steps. Pt practiced all techniques, including a short period of right SL to reach left axillo-inguinal pathway with  VC's and TC's for pressure/technique.  06/19/2023 Pulleys x 15 reps on left for shoulder flexion and scaption with therapist helping pt keep shoulder depresssed Cont MLD to left Breast as follows: In supine: Short neck, 5 diaphragmatic breaths, R axillary nodes and establishment of interaxillary pathway, L inguinal nodes and establishment of axilloinguinal pathway, then L breast moving fluid towards pathways spending extra time in any areas of fibrosis then retracing all steps. Pt practiced interaxillary pathway, axillo inguinal pathway and left medial breast with  VC's and TC's for  pressure/technique  06/17/23: Assessed progress towards goals. Encouraged pt to see ortho doctor regarding shoulder.Will attempt pulleys at beginning of next session. Cont MLD  as follows: In supine: Short neck, 5 diaphragmatic breaths, L axillary nodes and establishment of interaxillary pathway, R inguinal nodes and establishment of axilloinguinal pathway, then R breast moving fluid towards pathways spending extra time in any areas of fibrosis then retracing all steps.   06/12/23: Attempted PROM but pt was very guarded and had difficulty relaxing. Will attempt pulleys at beginning of next session. Cont MLD  as follows: In supine: Short neck, 5  diaphragmatic breaths, L axillary nodes and establishment of interaxillary pathway, R inguinal nodes and establishment of axilloinguinal pathway, then R breast moving fluid towards pathways spending extra time in any areas of fibrosis then retracing all steps. Lateral breast more fibrotic today but softened by end of session.    06/10/23: Pt described sequence to therapist that she has been completing at home. She had forgotten to create inter axillary pathway. MLD  as follows with therapist educating pt in every step: In supine: Short neck, 5 diaphragmatic breaths, L axillary nodes and establishment of interaxillary pathway, R inguinal nodes and establishment of axilloinguinal pathway, then R breast moving fluid towards pathways spending extra time in any areas of fibrosis then retracing all steps.  06/04/23: Instructed pt in self MLD today and had her return demonstrate each step with pt practicing for the first part of the session and therapist completing session as follows: In supine: Short neck, 5 diaphragmatic breaths, L axillary nodes and establishment of interaxillary pathway, R inguinal nodes and establishment of axilloinguinal pathway, then R breast moving fluid towards pathways spending extra time in any areas of fibrosis then retracing all  steps.  06/02/2023 Educated pt about role of lymphatics, importance of stretch, activation of LN's etc and discussed techniques while performing In supine: Short neck, 5 diaphragmatic breaths, R axillary nodes and establishment of interaxillary pathway, L inguinal nodes and establishment of axilloinguinal pathway, then L breast moving fluid towards pathways spending extra time in any areas of fibrosis then retracing all steps.  05/27/2023 Educated pt about role of lymphatics, importance of stretch, activation of LN's etc and discussed techniques while performing In supine: Short neck, 5 diaphragmatic breaths, R axillary nodes and establishment of interaxillary pathway, L inguinal nodes and establishment of axilloinguinal pathway, then L breast moving fluid towards pathways spending extra time in any areas of fibrosis then retracing all steps.    05/21/23: created foam chip pack for pt to wear in her bra for additional compression, issued script for compression bra    PATIENT EDUCATION:  Education details: lymphedema, anatomy and physiology of the lymphatic system, how to manage breast lymphedema, compression bra Person educated: Patient Education method: Explanation Education comprehension: verbalized understanding  HOME EXERCISE PROGRAM: Wear chip pack as much as tolerable Obtain compression bra and wear as much as possible Self MLD to R breast Access Code: [email or web address] URL: [email or web address] Date: 07/02/2023 Prepared by: Leonette Most  Exercises - Isometric Shoulder Flexion at Wall  - 1 x daily - 7 x weekly - 1 sets - 5 reps - 5 sec hold - Standing Isometric Shoulder Extension with Doorway - Arm Bent (Mirrored)  - 1 x daily - 7 x weekly - 1 sets - 5 reps - 5 sec hold - Seated Shoulder Flexion AAROM with Pulley Behind  - 1 x daily - 7 x weekly - min 2 - Seated Shoulder Abduction AAROM with Pulley Behind  - 1 x daily - 7 x weekly - Standing Isometric Shoulder  Internal Rotation at Doorway (Mirrored)  - 1 x daily - 7 x weekly - 1 sets - 5 reps - 5 sec hold - Standing Isometric Shoulder External Rotation with Doorway  - 1 x daily - 7 x weekly - 1 sets - 5 reps - 5 sec hold - Supine Single Arm Scapular Protraction  - 1 x daily - 7 x weekly - 1 sets - 10 reps  ASSESSMENT:  CLINICAL IMPRESSION: Began progressing strengthening today with cable machine to  active exercise in direction of extension instead of walkouts and pt was able to do this without increased pain. She reports she has been able to raise her arm up higher on the steering wheel when she is driving. Continued to focus on decreasing scapular elevation and compensation.   OBJECTIVE IMPAIRMENTS: decreased knowledge of condition, decreased knowledge of use of DME, decreased ROM, decreased strength, increased edema, increased fascial restrictions, impaired UE functional use, postural dysfunction, and pain.   ACTIVITY LIMITATIONS: carrying, lifting, sleeping, and reach over head  PARTICIPATION LIMITATIONS: cleaning, laundry, and community activity  PERSONAL FACTORS: Time since onset of injury/illness/exacerbation are also affecting patient's functional outcome.   REHAB POTENTIAL: Good  CLINICAL DECISION MAKING: Stable/uncomplicated  EVALUATION COMPLEXITY: Low  GOALS: Goals reviewed with patient? Yes  SHORT TERM GOALS=LONG TERM GOALS Target date: 06/18/23  Pt will be independent in self MLD for L breast for long term management of lymphedema. Baseline: Goal status: 06/17/23- MET - pt feels independent with this  2.  Pt will obtain a compression bra for long term management of lymphedema. Baseline:  Goal status: MET 06/17/23 - has been wearing it  3.  Pt will demonstrate 120 degrees of L shoulder flexion to allow imprved function. Baseline:  Goal status: ONGOING 06/17/23  4.  Pt will demonstrate 120 degrees of L shoulder abduction to allow improved function. Baseline:  Goal status:  ONGOING 06/17/23  5.  Pt will be independent in a home exercise program for continued strengthening and stretching.  Baseline:  Goal status: ONGOING  6.  Pt will report a 50% improvement in heaviness of L breast to allow improved comfort.  Baseline:  Goal status: 06/17/23: ONGOING - 25% improved  PLAN:  PT FREQUENCY: 2x/week  PT DURATION: other: 5 weeks  PLANNED INTERVENTIONS: Therapeutic exercises, Therapeutic activity, Patient/Family education, Self Care, Joint mobilization, Orthotic/Fit training, Manual lymph drainage, Compression bandaging, scar mobilization, Taping, Vasopneumatic device, Manual therapy, and Re-evaluation  PLAN FOR NEXT SESSION: pulleys, PROM to L shoulder, joint mobs L shoulder, how are isometrics? cont MLD to L breast and continue to instruct pt, , PROM to L shoulder, scap mobs to L scap  [facility], PT 07/10/2023, 9:02 AM

## 2023-07-14 ENCOUNTER — Ambulatory Visit: Payer: Medicare Other | Admitting: Physical Therapy

## 2023-07-14 ENCOUNTER — Encounter: Payer: Self-pay | Admitting: Physical Therapy

## 2023-07-14 DIAGNOSIS — L599 Disorder of the skin and subcutaneous tissue related to radiation, unspecified: Secondary | ICD-10-CM

## 2023-07-14 DIAGNOSIS — M25612 Stiffness of left shoulder, not elsewhere classified: Secondary | ICD-10-CM

## 2023-07-14 DIAGNOSIS — I890 Lymphedema, not elsewhere classified: Secondary | ICD-10-CM

## 2023-07-14 DIAGNOSIS — R293 Abnormal posture: Secondary | ICD-10-CM

## 2023-07-14 DIAGNOSIS — C50911 Malignant neoplasm of unspecified site of right female breast: Secondary | ICD-10-CM

## 2023-07-14 NOTE — Therapy (Signed)
OUTPATIENT PHYSICAL THERAPY  UPPER EXTREMITY ONCOLOGY TREATMMENT  Patient Name: Connie Marshall MRN: [id number] DOB:Dec 07, 1945, 77 y.o., female Today's Date: 07/14/2023  END OF SESSION:  PT End of Session - 07/14/23 0902     Visit Number 14    Number of Visits 17    Date for PT Re-Evaluation 07/22/23    PT Start Time 0901    PT Stop Time 0955    PT Time Calculation (min) 54 min    Activity Tolerance Patient tolerated treatment well    Behavior During Therapy WFL for tasks assessed/performed              Past Medical History:  Diagnosis Date   Axillary mass, left    Cancer (HCC)    Complication of anesthesia    Diverticulosis    Hemorrhoids    Hyperlipidemia    Hypertension    IBS (irritable bowel syndrome)    Obesity    PONV (postoperative nausea and vomiting)    Pre-diabetes    Psoriasis    Shingles 02/19/2021   Past Surgical History:  Procedure Laterality Date   BREAST LUMPECTOMY WITH RADIOACTIVE SEED AND SENTINEL LYMPH NODE BIOPSY Bilateral 10/05/2019   Procedure: BILATERAL BREAST LUMPECTOMY WITH BILATERAL RADIOACTIVE SEEDS AND LEFT SENTINEL LYMPH NODE BIOPSY;  Surgeon: Abigail Miyamoto, MD;  Location: [facility];  Service: General;  Laterality: Bilateral;   BREAST SURGERY     CESAREAN SECTION     2 times   CHOLECYSTECTOMY     laproscopic   CHOLECYSTECTOMY, LAPAROSCOPIC     kidney stone removed  10/14/2009   MASS EXCISION Left 07/12/2019   Procedure: EXCISION LEFT AXILLARY MASS;  Surgeon: Abigail Miyamoto, MD;  Location: [facility];  Service: General;  Laterality: Left;   RE-EXCISION OF BREAST LUMPECTOMY Left 10/05/2019   Procedure: RE-EXCISION OF LEFT BREAST CANCER;  Surgeon: Abigail Miyamoto, MD;  Location: [facility];  Service: General;  Laterality: Left;   THYROGLOSSAL DUCT CYST  10/14/1998   TONSILLECTOMY  10/14/1977   TOTAL HIP ARTHROPLASTY Right 05/14/2022   Procedure: RIGHT TOTAL HIP ARTHROPLASTY ANTERIOR  APPROACH;  Surgeon: Kathryne Hitch, MD;  Location: [facility];  Service: Orthopedics;  Laterality: Right;   umbilical herniorrhapy     VAGINAL HYSTERECTOMY  10/14/2001   Patient Active Problem List   Diagnosis Date Noted   Venous insufficiency 01/29/2023   Status post total replacement of right hip 05/14/2022   Unilateral primary osteoarthritis, right hip 03/27/2022   Preop exam for internal medicine 01/16/2022   Insect bite, infected 02/19/2021   Shingles outbreak 02/19/2021   Dysuria 05/16/2020   Right knee pain 05/16/2020   Carcinoma of upper-outer quadrant of left breast in female, estrogen receptor positive (HCC) 08/25/2019   Bilateral breast cancer (HCC) 08/17/2019   Skin nodule 05/11/2019   B12 deficiency 05/11/2019   Vitamin D deficiency 05/11/2019   Acute lymphadenitis 07/16/2017   Eustachian tube dysfunction, bilateral 07/16/2017   Prolapse of female bladder, acquired 03/25/2017   Sudden right hearing loss 03/25/2017   Left otitis media 03/25/2017   Rash 11/07/2015   Abnormal mammogram 09/13/2015   Blister of toe of left foot 04/13/2015   Breast discharge 04/13/2015   Encounter for well adult exam with abnormal findings 08/06/2011   Microscopic hematuria 04/27/2010   ANXIETY 03/25/2009   PARESTHESIA 03/23/2009   PERIPHERAL EDEMA 03/23/2009   HEMORRHOIDS, INTERNAL 02/13/2008   DIVERTICULOSIS, COLON 02/13/2008   IBS 02/13/2008   CHOLECYSTECTOMY, LAPAROSCOPIC, HX  OF 02/13/2008   OBESITY NOS 07/07/2007   Diabetes (HCC) 07/06/2007   Hyperlipidemia 07/06/2007   Essential hypertension 07/06/2007    PCP: Oliver Barre, MD  REFERRING PROVIDER: Serena Croissant, MD  REFERRING DIAG: C50.911,Z17.0,C50.912 (ICD-10-CM) - Bilateral malignant neoplasm of breast in female, estrogen receptor positive, unspecified site of breast (HCC)   THERAPY DIAG:  Stiffness of left shoulder, not elsewhere classified  Lymphedema, not elsewhere classified  Disorder of the skin and  subcutaneous tissue related to radiation, unspecified  Abnormal posture  Bilateral malignant neoplasm of breast in female, estrogen receptor positive, unspecified site of breast [facility])  ONSET DATE: 07/2022  Rationale for Evaluation and Treatment: Rehabilitation  SUBJECTIVE:                                                                                                                                                                                           SUBJECTIVE STATEMENT:  I am still reaching the steering wheel a little higher. I was not too sore after last time.   PERTINENT HISTORY: Hx of bilateral breast cancer- 10/05/19 R lumpectomy - intraductal papilloma no malignacy, L lumpectomy - fibrocystic change intraductal pailloma no malignacy idencidinfed, L breast re excision IDC grade 2, DCIS ER/PR+, HER2- 0/6 nodes Ki-67- 15%, completed radiation 12/22/2019. R THA 05/14/2022  PAIN:  Are you having pain? No   PRECAUTIONS: None  RED FLAGS: None   WEIGHT BEARING RESTRICTIONS: No  FALLS:  Has patient fallen in last 6 months? No  LIVING ENVIRONMENT: Lives with: lives alone Lives in: House/apartment Stairs: No;  Has following equipment at home: Single point cane, Environmental consultant - 2 wheeled, Environmental consultant - 4 wheeled, Wheelchair (manual), shower chair, and bed side commode  OCCUPATION: retired  LEISURE: [facility] [employer] prior to [facility]  HAND DOMINANCE: right   PRIOR LEVEL OF FUNCTION: Independent  PATIENT GOALS: to get the breast to go down   OBJECTIVE:  COGNITION: Overall cognitive status: Within functional limits for tasks assessed   PALPATION: L breast very fibrotic   OBSERVATIONS / OTHER ASSESSMENTS: peau d'orange texture, about 30-50% larger than R side  POSTURE: forward head, rounded shoulders  UPPER EXTREMITY AROM/PROM:  A/PROM RIGHT   eval   Shoulder extension 78  Shoulder flexion 143  Shoulder abduction 167  Shoulder internal rotation 59  Shoulder external  rotation 60    (Blank rows = not tested)  A/PROM LEFT   eval LEFT 06/17/23 LEFT 07/14/23  Shoulder extension 75    Shoulder flexion 58 48 41  Shoulder abduction 64 61 54  Shoulder internal rotation Unable to assess     Shoulder  external rotation Unable to assess      (Blank rows = not tested)    LYMPHEDEMA ASSESSMENTS:   LANDMARK RIGHT  eval  At axilla    15 cm proximal to olecranon process 33.5  10 cm proximal to olecranon process 32  Olecranon process 26.9  15 cm proximal to ulnar styloid process 24  10 cm proximal to ulnar styloid process 21  Just proximal to ulnar styloid process 17.2  Across hand at thumb web space 21.5  At base of 2nd digit 6.6  (Blank rows = not tested)  LANDMARK LEFT  eval  At axilla    15 cm proximal to olecranon process 35.4  10 cm proximal to olecranon process 31.8  Olecranon process 27  15 cm proximal to ulnar styloid process 23.9  10 cm proximal to ulnar styloid process 20.4  Just proximal to ulnar styloid process 17.4  Across hand at thumb web space 21.8  At base of 2nd digit 6.5  (Blank rows = not tested)      TODAY'S TREATMENT:                                                                                                                                          DATE:  Pt permission and consent throughout each step of examination and treatment with modification and draping if requested when working on sensitive areas 07/14/23: Pulleys x 2 min in direction of flexion and 2 min in direction of scaption with v/c for posture Dual cable machine with 3 lbs resistance and 10 reps of each of the following with pt returning therapist demo: flexion  walkouts (L), extension (L), ER walkouts with v/c to keep elbow at side (L) and scapular retraction (bilaterally) all with v/c for proper form and positioning of elbow Scapular stabilization with ball on wall doing 10 circles in clockwise direction and 10 counter clockwise with v/c for scapular  depression throughout - very difficult for pt In R sidelying with pillow under L hip: scapular mobilization in all directions, ER with 1 lb weight x 10 reps In supine: PROM to L shoulder in direction of flexion, scaption, and abduction with v/c to keep relaxed with no increased pain   07/10/23: Pulleys x 2 min in direction of flexion and 2 min in direction of scaption with v/c for posture Dual cable machine with 3 lbs resistance and 10 reps of each of the following with pt returning therapist demo: flexion  walkouts (L), extension (L), ER walkouts with v/c to keep elbow at side (L) and scapular retraction (bilaterally) all with v/c for proper form and positioning of elbow Scapular stabilization with ball on wall doing 10 circles in clockwise direction and 10 counter clockwise with v/c for scapular depression throughout - very difficult for pt In R sidelying with pillow under L hip: scapular mobilization in all directions, AAROM in to flexion  x 10 with therapist assisting with scapular depression throughout, AAROM abduction with no increased pain with this and better form x 10 In supine: PROM to L shoulder in direction of flexion, scaption, and abduction with v/c to keep relaxed with no increased pain  STM to upper traps, lats and levator on L to help decrease muscle tension Instructed pt in using a tennis ball in stockinette for self soft tissue mobilization  07/08/23: Pulleys x 2 min in direction of flexion and 2 min in direction of scaption with v/c for posture Dual cable machine with 3 lbs resistance and 10 reps of each of the following with pt returning therapist demo for resisted walkouts: flexion (L), extension (L), ER (L) and scapular retraction (bilaterally) all with v/c for proper form and positioning of elbow Scapular stabilization with ball on wall doing 10 circles in clockwise direction and 10 counter clockwise with v/c for scapular depression throughout In supine: PROM to L shoulder in  direction of flexion, scaption, abduction and ER with v/c to keep relaxed with no increased pain  Gentle A-P joint mobs grade 3 with no increased pain but pt did feel tightness x 2 min Supine dowel flexion x 10 reps with v/c to keep shoulder relaxed Attempted AROM in sitting but pt unable to do flexion or abduction without compensation and increased pain  07/02/23:  Pulleys x 2 min in direction of flexion and 2 min in direction of abduction with v/c to keep shoulder relaxed In R sidelying with pillow under L hip: scapular mobilization in all directions with decreased motion noted in all directions but improved with mobs In supine: PROM to L shoulder in direction of flexion, scaption, abduction and ER with v/c to keep relaxed with no increased pain  Gentle A-P joint mobs grade 3 with no increased pain but pt did feel tightness x 2 min Instructed pt in flexion, IR,  ER, and extension isometrics for L shoulder at wall x 5 with 5 sec holds  Supine serratus punches x 10 reps with v/c for correct form  06/30/23:  Pulleys x 2 min in direction of flexion and 2 min in direction of abduction with v/c to keep shoulder relaxed Attempted ball but there was increased pain so stopped In R sidelying with pillow under L hip: scapular mobilization in all directions with decreased motion noted in all directions In supine: PROM to L shoulder in direction of flexion, scaption and ER with v/c to keep relaxed with no increased pain  Gentle A-P joint mobs grade 3 with no increased pain but pt did feel tightness x 2 min Instructed pt in flexion and extension isometrics for L shoulder at wall x 5 with 5 sec holds   06/25/2023 Pulleys x 15-20 reps on left for shoulder flexion and scaption with therapist providing VC's and TC's prn to help pt keep shoulder depresssed Cont MLD to left Breast as follows: In supine: Short neck, 5 diaphragmatic breaths, R axillary nodes and establishment of interaxillary pathway, L inguinal  nodes and establishment of axilloinguinal pathway, then L breast moving fluid towards pathways spending extra time in any areas of fibrosis then retracing all steps. Pt practiced all techniques, including a short period of right SL to reach left axillo-inguinal pathway with  VC's and TC's for pressure/technique.  06/19/2023 Pulleys x 15 reps on left for shoulder flexion and scaption with therapist helping pt keep shoulder depresssed Cont MLD to left Breast as follows: In supine: Short neck, 5 diaphragmatic breaths,  R axillary nodes and establishment of interaxillary pathway, L inguinal nodes and establishment of axilloinguinal pathway, then L breast moving fluid towards pathways spending extra time in any areas of fibrosis then retracing all steps. Pt practiced interaxillary pathway, axillo inguinal pathway and left medial breast with  VC's and TC's for pressure/technique  06/17/23: Assessed progress towards goals. Encouraged pt to see ortho doctor regarding shoulder.Will attempt pulleys at beginning of next session. Cont MLD  as follows: In supine: Short neck, 5 diaphragmatic breaths, L axillary nodes and establishment of interaxillary pathway, R inguinal nodes and establishment of axilloinguinal pathway, then R breast moving fluid towards pathways spending extra time in any areas of fibrosis then retracing all steps.   06/12/23: Attempted PROM but pt was very guarded and had difficulty relaxing. Will attempt pulleys at beginning of next session. Cont MLD  as follows: In supine: Short neck, 5 diaphragmatic breaths, L axillary nodes and establishment of interaxillary pathway, R inguinal nodes and establishment of axilloinguinal pathway, then R breast moving fluid towards pathways spending extra time in any areas of fibrosis then retracing all steps. Lateral breast more fibrotic today but softened by end of session.    06/10/23: Pt described sequence to therapist that she has been completing at home. She  had forgotten to create inter axillary pathway. MLD  as follows with therapist educating pt in every step: In supine: Short neck, 5 diaphragmatic breaths, L axillary nodes and establishment of interaxillary pathway, R inguinal nodes and establishment of axilloinguinal pathway, then R breast moving fluid towards pathways spending extra time in any areas of fibrosis then retracing all steps.  06/04/23: Instructed pt in self MLD today and had her return demonstrate each step with pt practicing for the first part of the session and therapist completing session as follows: In supine: Short neck, 5 diaphragmatic breaths, L axillary nodes and establishment of interaxillary pathway, R inguinal nodes and establishment of axilloinguinal pathway, then R breast moving fluid towards pathways spending extra time in any areas of fibrosis then retracing all steps.  06/02/2023 Educated pt about role of lymphatics, importance of stretch, activation of LN's etc and discussed techniques while performing In supine: Short neck, 5 diaphragmatic breaths, R axillary nodes and establishment of interaxillary pathway, L inguinal nodes and establishment of axilloinguinal pathway, then L breast moving fluid towards pathways spending extra time in any areas of fibrosis then retracing all steps.  05/27/2023 Educated pt about role of lymphatics, importance of stretch, activation of LN's etc and discussed techniques while performing In supine: Short neck, 5 diaphragmatic breaths, R axillary nodes and establishment of interaxillary pathway, L inguinal nodes and establishment of axilloinguinal pathway, then L breast moving fluid towards pathways spending extra time in any areas of fibrosis then retracing all steps.    05/21/23: created foam chip pack for pt to wear in her bra for additional compression, issued script for compression bra    PATIENT EDUCATION:  Education details: lymphedema, anatomy and physiology of the lymphatic system,  how to manage breast lymphedema, compression bra Person educated: Patient Education method: Explanation Education comprehension: verbalized understanding  HOME EXERCISE PROGRAM: Wear chip pack as much as tolerable Obtain compression bra and wear as much as possible Self MLD to R breast Access Code: [email or web address] URL: [email or web address] Date: 07/02/2023 Prepared by: Leonette Most  Exercises - Isometric Shoulder Flexion at Wall  - 1 x daily - 7 x weekly - 1 sets - 5 reps - 5 sec hold - Standing Isometric  Shoulder Extension with Doorway - Arm Bent (Mirrored)  - 1 x daily - 7 x weekly - 1 sets - 5 reps - 5 sec hold - Seated Shoulder Flexion AAROM with Pulley Behind  - 1 x daily - 7 x weekly - min 2 - Seated Shoulder Abduction AAROM with Pulley Behind  - 1 x daily - 7 x weekly - Standing Isometric Shoulder Internal Rotation at Doorway (Mirrored)  - 1 x daily - 7 x weekly - 1 sets - 5 reps - 5 sec hold - Standing Isometric Shoulder External Rotation with Doorway  - 1 x daily - 7 x weekly - 1 sets - 5 reps - 5 sec hold - Supine Single Arm Scapular Protraction  - 1 x daily - 7 x weekly - 1 sets - 10 reps  ASSESSMENT:  CLINICAL IMPRESSION: Remeasured AROM today of L shoulder. Her motion has been decreasing since beginning therapy but pt feels she is able to use her arm more than she had been. She has never had an MRI to assess her rotator cuff musculature and reports she does not have any pain. She does not want to have any injections or surgery. Will continue to assess ROM and determine if she should continue with physical therapy. Educated pt to continue her HEP.   OBJECTIVE IMPAIRMENTS: decreased knowledge of condition, decreased knowledge of use of DME, decreased ROM, decreased strength, increased edema, increased fascial restrictions, impaired UE functional use, postural dysfunction, and pain.   ACTIVITY LIMITATIONS: carrying, lifting, sleeping, and reach over  head  PARTICIPATION LIMITATIONS: cleaning, laundry, and community activity  PERSONAL FACTORS: Time since onset of injury/illness/exacerbation are also affecting patient's functional outcome.   REHAB POTENTIAL: Good  CLINICAL DECISION MAKING: Stable/uncomplicated  EVALUATION COMPLEXITY: Low  GOALS: Goals reviewed with patient? Yes  SHORT TERM GOALS=LONG TERM GOALS Target date: 06/18/23  Pt will be independent in self MLD for L breast for long term management of lymphedema. Baseline: Goal status: 06/17/23- MET - pt feels independent with this  2.  Pt will obtain a compression bra for long term management of lymphedema. Baseline:  Goal status: MET 06/17/23 - has been wearing it  3.  Pt will demonstrate 120 degrees of L shoulder flexion to allow imprved function. Baseline:  Goal status: ONGOING 06/17/23  4.  Pt will demonstrate 120 degrees of L shoulder abduction to allow improved function. Baseline:  Goal status: ONGOING 06/17/23  5.  Pt will be independent in a home exercise program for continued strengthening and stretching.  Baseline:  Goal status: ONGOING  6.  Pt will report a 50% improvement in heaviness of L breast to allow improved comfort.  Baseline:  Goal status: 06/17/23: ONGOING - 25% improved  PLAN:  PT FREQUENCY: 2x/week  PT DURATION: other: 5 weeks  PLANNED INTERVENTIONS: Therapeutic exercises, Therapeutic activity, Patient/Family education, Self Care, Joint mobilization, Orthotic/Fit training, Manual lymph drainage, Compression bandaging, scar mobilization, Taping, Vasopneumatic device, Manual therapy, and Re-evaluation  PLAN FOR NEXT SESSION: pulleys, PROM to L shoulder, joint mobs L shoulder, how are isometrics? cont MLD to L breast and continue to instruct pt, , PROM to L shoulder, scap mobs to L scap  [employer], PT 07/14/2023, 1:27 PM

## 2023-07-15 ENCOUNTER — Ambulatory Visit: Payer: Medicare Other | Attending: Hematology and Oncology | Admitting: Physical Therapy

## 2023-07-15 ENCOUNTER — Encounter: Payer: Self-pay | Admitting: Physical Therapy

## 2023-07-15 DIAGNOSIS — R293 Abnormal posture: Secondary | ICD-10-CM | POA: Diagnosis present

## 2023-07-15 DIAGNOSIS — C50912 Malignant neoplasm of unspecified site of left female breast: Secondary | ICD-10-CM | POA: Diagnosis present

## 2023-07-15 DIAGNOSIS — I890 Lymphedema, not elsewhere classified: Secondary | ICD-10-CM | POA: Diagnosis present

## 2023-07-15 DIAGNOSIS — M25612 Stiffness of left shoulder, not elsewhere classified: Secondary | ICD-10-CM | POA: Diagnosis present

## 2023-07-15 DIAGNOSIS — L599 Disorder of the skin and subcutaneous tissue related to radiation, unspecified: Secondary | ICD-10-CM | POA: Diagnosis present

## 2023-07-15 DIAGNOSIS — C50911 Malignant neoplasm of unspecified site of right female breast: Secondary | ICD-10-CM | POA: Diagnosis present

## 2023-07-15 DIAGNOSIS — Z170 Estrogen receptor positive status [ER+]: Secondary | ICD-10-CM | POA: Insufficient documentation

## 2023-07-15 NOTE — Therapy (Signed)
OUTPATIENT PHYSICAL THERAPY  UPPER EXTREMITY ONCOLOGY TREATMMENT  Patient Name: Connie Marshall MRN: [id number] DOB:24-Jul-1946, 77 y.o., female Today's Date: 07/15/2023  END OF SESSION:  PT End of Session - 07/15/23 0904     Visit Number 15    Number of Visits 17    Date for PT Re-Evaluation 07/22/23    PT Start Time 0903    PT Stop Time 0955    PT Time Calculation (min) 52 min    Activity Tolerance Patient tolerated treatment well    Behavior During Therapy WFL for tasks assessed/performed               Past Medical History:  Diagnosis Date   Axillary mass, left    Cancer (HCC)    Complication of anesthesia    Diverticulosis    Hemorrhoids    Hyperlipidemia    Hypertension    IBS (irritable bowel syndrome)    Obesity    PONV (postoperative nausea and vomiting)    Pre-diabetes    Psoriasis    Shingles 02/19/2021   Past Surgical History:  Procedure Laterality Date   BREAST LUMPECTOMY WITH RADIOACTIVE SEED AND SENTINEL LYMPH NODE BIOPSY Bilateral 10/05/2019   Procedure: BILATERAL BREAST LUMPECTOMY WITH BILATERAL RADIOACTIVE SEEDS AND LEFT SENTINEL LYMPH NODE BIOPSY;  Surgeon: Abigail Miyamoto, MD;  Location: [facility];  Service: General;  Laterality: Bilateral;   BREAST SURGERY     CESAREAN SECTION     2 times   CHOLECYSTECTOMY     laproscopic   CHOLECYSTECTOMY, LAPAROSCOPIC     kidney stone removed  10/14/2009   MASS EXCISION Left 07/12/2019   Procedure: EXCISION LEFT AXILLARY MASS;  Surgeon: Abigail Miyamoto, MD;  Location: [facility];  Service: General;  Laterality: Left;   RE-EXCISION OF BREAST LUMPECTOMY Left 10/05/2019   Procedure: RE-EXCISION OF LEFT BREAST CANCER;  Surgeon: Abigail Miyamoto, MD;  Location:  SURGERY CENTER;  Service: General;  Laterality: Left;   THYROGLOSSAL DUCT CYST  10/14/1998   TONSILLECTOMY  10/14/1977   TOTAL HIP ARTHROPLASTY Right 05/14/2022   Procedure: RIGHT TOTAL HIP ARTHROPLASTY  ANTERIOR APPROACH;  Surgeon: Kathryne Hitch, MD;  Location: [facility];  Service: Orthopedics;  Laterality: Right;   umbilical herniorrhapy     VAGINAL HYSTERECTOMY  10/14/2001   Patient Active Problem List   Diagnosis Date Noted   Venous insufficiency 01/29/2023   Status post total replacement of right hip 05/14/2022   Unilateral primary osteoarthritis, right hip 03/27/2022   Preop exam for internal medicine 01/16/2022   Insect bite, infected 02/19/2021   Shingles outbreak 02/19/2021   Dysuria 05/16/2020   Right knee pain 05/16/2020   Carcinoma of upper-outer quadrant of left breast in female, estrogen receptor positive (HCC) 08/25/2019   Bilateral breast cancer (HCC) 08/17/2019   Skin nodule 05/11/2019   B12 deficiency 05/11/2019   Vitamin D deficiency 05/11/2019   Acute lymphadenitis 07/16/2017   Eustachian tube dysfunction, bilateral 07/16/2017   Prolapse of female bladder, acquired 03/25/2017   Sudden right hearing loss 03/25/2017   Left otitis media 03/25/2017   Rash 11/07/2015   Abnormal mammogram 09/13/2015   Blister of toe of left foot 04/13/2015   Breast discharge 04/13/2015   Encounter for well adult exam with abnormal findings 08/06/2011   Microscopic hematuria 04/27/2010   ANXIETY 03/25/2009   PARESTHESIA 03/23/2009   PERIPHERAL EDEMA 03/23/2009   HEMORRHOIDS, INTERNAL 02/13/2008   DIVERTICULOSIS, COLON 02/13/2008   IBS 02/13/2008   CHOLECYSTECTOMY, LAPAROSCOPIC,  HX OF 02/13/2008   OBESITY NOS 07/07/2007   Diabetes (HCC) 07/06/2007   Hyperlipidemia 07/06/2007   Essential hypertension 07/06/2007    PCP: Oliver Barre, MD  REFERRING PROVIDER: Serena Croissant, MD  REFERRING DIAG: C50.911,Z17.0,C50.912 (ICD-10-CM) - Bilateral malignant neoplasm of breast in female, estrogen receptor positive, unspecified site of breast (HCC)   THERAPY DIAG:  Stiffness of left shoulder, not elsewhere classified  Lymphedema, not elsewhere classified  Disorder of the skin and  subcutaneous tissue related to radiation, unspecified  Abnormal posture  Bilateral malignant neoplasm of breast in female, estrogen receptor positive, unspecified site of breast [facility]  ONSET DATE: 07/2022  Rationale for Evaluation and Treatment: Rehabilitation  SUBJECTIVE:                                                                                                                                                                                           SUBJECTIVE STATEMENT:  I still find myself holding the steering wheel higher. I don't have any pain after the session.   PERTINENT HISTORY: Hx of bilateral breast cancer- 10/05/19 R lumpectomy - intraductal papilloma no malignacy, L lumpectomy - fibrocystic change intraductal pailloma no malignacy idencidinfed, L breast re excision IDC grade 2, DCIS ER/PR+, HER2- 0/6 nodes Ki-67- 15%, completed radiation 12/22/2019. R THA 05/14/2022  PAIN:  Are you having pain? No   PRECAUTIONS: None  RED FLAGS: None   WEIGHT BEARING RESTRICTIONS: No  FALLS:  Has patient fallen in last 6 months? No  LIVING ENVIRONMENT: Lives with: lives alone Lives in: House/apartment Stairs: No;  Has following equipment at home: Single point cane, Environmental consultant - 2 wheeled, Environmental consultant - 4 wheeled, Wheelchair (manual), shower chair, and bed side commode  OCCUPATION: retired  LEISURE: [facility] prior to this  HAND DOMINANCE: right   PRIOR LEVEL OF FUNCTION: Independent  PATIENT GOALS: to get the breast to go down   OBJECTIVE:  COGNITION: Overall cognitive status: Within functional limits for tasks assessed   PALPATION: L breast very fibrotic   OBSERVATIONS / OTHER ASSESSMENTS: peau d'orange texture, about 30-50% larger than R side  POSTURE: forward head, rounded shoulders  UPPER EXTREMITY AROM/PROM:  A/PROM RIGHT   eval   Shoulder extension 78  Shoulder flexion 143  Shoulder abduction 167  Shoulder internal rotation 59  Shoulder  external rotation 60    (Blank rows = not tested)  A/PROM LEFT   eval LEFT 06/17/23 LEFT 07/14/23 LEFT 07/15/23  Shoulder extension 75     Shoulder flexion 58 48 41 59  Shoulder abduction 64 61 54 60  Shoulder internal rotation Unable to assess  Shoulder external rotation Unable to assess       (Blank rows = not tested)    LYMPHEDEMA ASSESSMENTS:   LANDMARK RIGHT  eval  At axilla    15 cm proximal to olecranon process 33.5  10 cm proximal to olecranon process 32  Olecranon process 26.9  15 cm proximal to ulnar styloid process 24  10 cm proximal to ulnar styloid process 21  Just proximal to ulnar styloid process 17.2  Across hand at thumb web space 21.5  At base of 2nd digit 6.6  (Blank rows = not tested)  LANDMARK LEFT  eval  At axilla    15 cm proximal to olecranon process 35.4  10 cm proximal to olecranon process 31.8  Olecranon process 27  15 cm proximal to ulnar styloid process 23.9  10 cm proximal to ulnar styloid process 20.4  Just proximal to ulnar styloid process 17.4  Across hand at thumb web space 21.8  At base of 2nd digit 6.5  (Blank rows = not tested)      TODAY'S TREATMENT:                                                                                                                                          DATE:  Pt permission and consent throughout each step of examination and treatment with modification and draping if requested when working on sensitive areas 07/15/23: Pulleys x 2 min in direction of flexion and 2 min in direction of abduction with v/c to keep shoulder relaxed Began resisted isometrics so pt can mimic the dual cable machine exercises at home and had her return therapist demo each for 10 reps as follows with red band: flexion, extension, ER and then resisted scapular retraction while providing v/c for proper positioning of arm throughout  Created a home exercise program with all exercises pt has been doing: pulleys, standing  isometrics at wall, resisted isometrics at door, sidelying AROM and educated pt on how to group exercises and do half of them daily- alternating- pt verbalized understanding PROM to L shoulder in to flexion, abduction and scaption to pt's toelrance  07/14/23: Pulleys x 2 min in direction of flexion and 2 min in direction of scaption with v/c for posture Dual cable machine with 3 lbs resistance and 10 reps of each of the following with pt returning therapist demo: flexion  walkouts (L), extension (L), ER walkouts with v/c to keep elbow at side (L) and scapular retraction (bilaterally) all with v/c for proper form and positioning of elbow Scapular stabilization with ball on wall doing 10 circles in clockwise direction and 10 counter clockwise with v/c for scapular depression throughout - very difficult for pt In R sidelying with pillow under L hip: scapular mobilization in all directions, ER with 1 lb weight x 10 reps In supine: PROM to L shoulder in direction of flexion, scaption, and  abduction with v/c to keep relaxed with no increased pain   07/10/23: Pulleys x 2 min in direction of flexion and 2 min in direction of scaption with v/c for posture Dual cable machine with 3 lbs resistance and 10 reps of each of the following with pt returning therapist demo: flexion  walkouts (L), extension (L), ER walkouts with v/c to keep elbow at side (L) and scapular retraction (bilaterally) all with v/c for proper form and positioning of elbow Scapular stabilization with ball on wall doing 10 circles in clockwise direction and 10 counter clockwise with v/c for scapular depression throughout - very difficult for pt In R sidelying with pillow under L hip: scapular mobilization in all directions, AAROM in to flexion x 10 with therapist assisting with scapular depression throughout, AAROM abduction with no increased pain with this and better form x 10 In supine: PROM to L shoulder in direction of flexion, scaption, and  abduction with v/c to keep relaxed with no increased pain  STM to upper traps, lats and levator on L to help decrease muscle tension Instructed pt in using a tennis ball in stockinette for self soft tissue mobilization  07/08/23: Pulleys x 2 min in direction of flexion and 2 min in direction of scaption with v/c for posture Dual cable machine with 3 lbs resistance and 10 reps of each of the following with pt returning therapist demo for resisted walkouts: flexion (L), extension (L), ER (L) and scapular retraction (bilaterally) all with v/c for proper form and positioning of elbow Scapular stabilization with ball on wall doing 10 circles in clockwise direction and 10 counter clockwise with v/c for scapular depression throughout In supine: PROM to L shoulder in direction of flexion, scaption, abduction and ER with v/c to keep relaxed with no increased pain  Gentle A-P joint mobs grade 3 with no increased pain but pt did feel tightness x 2 min Supine dowel flexion x 10 reps with v/c to keep shoulder relaxed Attempted AROM in sitting but pt unable to do flexion or abduction without compensation and increased pain  07/02/23:  Pulleys x 2 min in direction of flexion and 2 min in direction of abduction with v/c to keep shoulder relaxed In R sidelying with pillow under L hip: scapular mobilization in all directions with decreased motion noted in all directions but improved with mobs In supine: PROM to L shoulder in direction of flexion, scaption, abduction and ER with v/c to keep relaxed with no increased pain  Gentle A-P joint mobs grade 3 with no increased pain but pt did feel tightness x 2 min Instructed pt in flexion, IR,  ER, and extension isometrics for L shoulder at wall x 5 with 5 sec holds  Supine serratus punches x 10 reps with v/c for correct form  06/30/23:  Pulleys x 2 min in direction of flexion and 2 min in direction of abduction with v/c to keep shoulder relaxed Attempted ball but there  was increased pain so stopped In R sidelying with pillow under L hip: scapular mobilization in all directions with decreased motion noted in all directions In supine: PROM to L shoulder in direction of flexion, scaption and ER with v/c to keep relaxed with no increased pain  Gentle A-P joint mobs grade 3 with no increased pain but pt did feel tightness x 2 min Instructed pt in flexion and extension isometrics for L shoulder at wall x 5 with 5 sec holds   06/25/2023 Pulleys x 15-20 reps  on left for shoulder flexion and scaption with therapist providing VC's and TC's prn to help pt keep shoulder depresssed Cont MLD to left Breast as follows: In supine: Short neck, 5 diaphragmatic breaths, R axillary nodes and establishment of interaxillary pathway, L inguinal nodes and establishment of axilloinguinal pathway, then L breast moving fluid towards pathways spending extra time in any areas of fibrosis then retracing all steps. Pt practiced all techniques, including a short period of right SL to reach left axillo-inguinal pathway with  VC's and TC's for pressure/technique.  06/19/2023 Pulleys x 15 reps on left for shoulder flexion and scaption with therapist helping pt keep shoulder depresssed Cont MLD to left Breast as follows: In supine: Short neck, 5 diaphragmatic breaths, R axillary nodes and establishment of interaxillary pathway, L inguinal nodes and establishment of axilloinguinal pathway, then L breast moving fluid towards pathways spending extra time in any areas of fibrosis then retracing all steps. Pt practiced interaxillary pathway, axillo inguinal pathway and left medial breast with  VC's and TC's for pressure/technique  06/17/23: Assessed progress towards goals. Encouraged pt to see ortho doctor regarding shoulder.Will attempt pulleys at beginning of next session. Cont MLD  as follows: In supine: Short neck, 5 diaphragmatic breaths, L axillary nodes and establishment of interaxillary pathway, R  inguinal nodes and establishment of axilloinguinal pathway, then R breast moving fluid towards pathways spending extra time in any areas of fibrosis then retracing all steps.   06/12/23: Attempted PROM but pt was very guarded and had difficulty relaxing. Will attempt pulleys at beginning of next session. Cont MLD  as follows: In supine: Short neck, 5 diaphragmatic breaths, L axillary nodes and establishment of interaxillary pathway, R inguinal nodes and establishment of axilloinguinal pathway, then R breast moving fluid towards pathways spending extra time in any areas of fibrosis then retracing all steps. Lateral breast more fibrotic today but softened by end of session.    06/10/23: Pt described sequence to therapist that she has been completing at home. She had forgotten to create inter axillary pathway. MLD  as follows with therapist educating pt in every step: In supine: Short neck, 5 diaphragmatic breaths, L axillary nodes and establishment of interaxillary pathway, R inguinal nodes and establishment of axilloinguinal pathway, then R breast moving fluid towards pathways spending extra time in any areas of fibrosis then retracing all steps.  06/04/23: Instructed pt in self MLD today and had her return demonstrate each step with pt practicing for the first part of the session and therapist completing session as follows: In supine: Short neck, 5 diaphragmatic breaths, L axillary nodes and establishment of interaxillary pathway, R inguinal nodes and establishment of axilloinguinal pathway, then R breast moving fluid towards pathways spending extra time in any areas of fibrosis then retracing all steps.  06/02/2023 Educated pt about role of lymphatics, importance of stretch, activation of LN's etc and discussed techniques while performing In supine: Short neck, 5 diaphragmatic breaths, R axillary nodes and establishment of interaxillary pathway, L inguinal nodes and establishment of axilloinguinal  pathway, then L breast moving fluid towards pathways spending extra time in any areas of fibrosis then retracing all steps.  05/27/2023 Educated pt about role of lymphatics, importance of stretch, activation of LN's etc and discussed techniques while performing In supine: Short neck, 5 diaphragmatic breaths, R axillary nodes and establishment of interaxillary pathway, L inguinal nodes and establishment of axilloinguinal pathway, then L breast moving fluid towards pathways spending extra time in any areas of fibrosis then  retracing all steps.    05/21/23: created foam chip pack for pt to wear in her bra for additional compression, issued script for compression bra    PATIENT EDUCATION:  Education details: HEP  Person educated: Patient Education method: Explanation, Demonstration, Tactile cues, Verbal cues, and Handouts Education comprehension: verbalized understanding and returned demonstration  HOME EXERCISE PROGRAM: Wear chip pack as much as tolerable Obtain compression bra and wear as much as possible Self MLD to R breast Access Code: [email or web address] URL: [email or web address] Date: 07/15/2023 Prepared by: Leonette Most  Exercises - Seated Shoulder Flexion AAROM with Pulley Behind  - 1 x daily - 7 x weekly - min 2 - Seated Shoulder Abduction AAROM with Pulley Behind  - 1 x daily - 7 x weekly - Isometric Shoulder Flexion at Wall  - 1 x daily - 3-4 x weekly - 1 sets - 5 reps - 5 sec hold - Standing Isometric Shoulder Extension with Doorway - Arm Bent (Mirrored)  - 1 x daily - 3-4 x weekly - 1 sets - 5 reps - 5 sec hold - Standing Isometric Shoulder Internal Rotation at Doorway (Mirrored)  - 1 x daily - 3-4 x weekly - 1 sets - 5 reps - 5 sec hold - Standing Isometric Shoulder External Rotation with Doorway  - 1 x daily - 3-4 x weekly - 1 sets - 5 reps - 5 sec hold - Supine Single Arm Scapular Protraction  - 1 x daily - 3-4 x weekly - 1 sets - 10 reps - Sidelying Shoulder  Flexion 15 Degrees  - 1 x daily - 3-4 x weekly - 1 sets - 10 reps - Sidelying Shoulder Abduction Palm Forward (Mirrored)  - 1 x daily - 3-4 x weekly - 1 sets - 10 reps - Sidelying Shoulder External Rotation  - 1 x daily - 3-4 x weekly - 1 sets - 10 reps - Scapular Retraction with Resistance  - 1 x daily - 3-4 x weekly - 1 sets - 10 reps - Standing Shoulder Flexion Reactive Isometric  - 1 x daily - 3-4 x weekly - 1 sets - 10 reps - Shoulder Extension Reactive Isometrics with Elbow at 90 (Mirrored)  - 1 x daily - 3-4 x weekly - 1 sets - 10 reps - Shoulder External Rotation Reactive Isometrics  - 1 x daily - 3-4 x weekly - 1 sets - 10 reps  ASSESSMENT:  CLINICAL IMPRESSION: Remeasured shoulder ROM and it was improved from yesterday. Pt is not having any pain. Decided with patient today to discharge and pt will be diligent with a home exercise program. Spent today's session creating a home exercise program and educating pt in how to do her HEP to continue to progress her shoulder ROM and strength. Educated pt to reach out if she feels she is not progressing or has any needs.   OBJECTIVE IMPAIRMENTS: decreased knowledge of condition, decreased knowledge of use of DME, decreased ROM, decreased strength, increased edema, increased fascial restrictions, impaired UE functional use, postural dysfunction, and pain.   ACTIVITY LIMITATIONS: carrying, lifting, sleeping, and reach over head  PARTICIPATION LIMITATIONS: cleaning, laundry, and community activity  PERSONAL FACTORS: Time since onset of injury/illness/exacerbation are also affecting patient's functional outcome.   REHAB POTENTIAL: Good  CLINICAL DECISION MAKING: Stable/uncomplicated  EVALUATION COMPLEXITY: Low  GOALS: Goals reviewed with patient? Yes  SHORT TERM GOALS=LONG TERM GOALS Target date: 06/18/23  Pt will be independent in self MLD for L breast for long term  management of lymphedema. Baseline: Goal status: 06/17/23- MET - pt feels  independent with this  2.  Pt will obtain a compression bra for long term management of lymphedema. Baseline:  Goal status: MET 06/17/23 - has been wearing it  3.  Pt will demonstrate 120 degrees of L shoulder flexion to allow imprved function. Baseline:  Goal status: NOT MET 59 07/15/23    4.  Pt will demonstrate 120 degrees of L shoulder abduction to allow improved function. Baseline:  Goal status: NOT MET 07/15/23 60  5.  Pt will be independent in a home exercise program for continued strengthening and stretching.  Baseline:  Goal status: MET 07/15/23  6.  Pt will report a 50% improvement in heaviness of L breast to allow improved comfort.  Baseline:  Goal status: MET 07/15/23: 75% improved; 06/17/23: - 25% improved  PLAN:  PT FREQUENCY: 2x/week  PT DURATION: other: 5 weeks  PLANNED INTERVENTIONS: Therapeutic exercises, Therapeutic activity, Patient/Family education, Self Care, Joint mobilization, Orthotic/Fit training, Manual lymph drainage, Compression bandaging, scar mobilization, Taping, Vasopneumatic device, Manual therapy, and Re-evaluation  PLAN FOR NEXT SESSION: d/c this visit  Leonette Most, PT 07/15/2023, 12:00 PM  PHYSICAL THERAPY DISCHARGE SUMMARY  Visits from Start of Care: 15  Current functional level related to goals / functional outcomes: All lymphedema goals met, pt did not meet shoulder ROM goals    Remaining deficits: See above, decreased L shoulder ROM   Education / Equipment: HEP, self MLD, compression bra, lymphedema education   Patient agrees to discharge. Patient goals were partially met. Patient is being discharged due to  meeting lymphedema goals, pt to continue to work on L shoulder ROM at home with HEP and will reach out if she is unable to progress.  Milagros Loll [place], [place] 07/15/23 12:00 PM

## 2023-07-31 ENCOUNTER — Encounter: Payer: Self-pay | Admitting: Internal Medicine

## 2023-09-25 NOTE — Progress Notes (Signed)
Rubin Payor, PhD, LAT, ATC acting as a scribe for Clementeen Graham, MD.  Connie Marshall is a 77 y.o. female who presents to [facility] at [facility] today for R knee pain. Pt was previously seen by Dr. Denyse Amass on 06/25/23 for L shoulder pain.  Today, pt c/o R knee pain ongoing since last wk. Pt locates pain to the medial aspect of her R knee. She is wanting some relief since it's the holiday season and she has a lot going on.  Additionally she does have some left lateral hip pain.  R Knee swelling: no Mechanical symptoms: no Aggravates: walking, increased activity Treatments tried: heat, elevation  Pertinent review of systems: No fevers or chills  Relevant historical information: Right hip replacement about a year ago.  Diabetes.  Hypertension.   Exam:  BP (!) 142/78   Pulse 74   Ht 5\' 7"  (1.702 m)   Wt 224 lb (101.6 kg)   SpO2 96%   BMI 35.08 kg/m  General: Well Developed, well nourished, and in no acute distress.   MSK: Right knee moderate effusion.  Normal motion.  Tender palpation medial joint line.  Stable ligamentous exam intact strength.  Left hip normal motion tender palpation greater trochanter.    Lab and Radiology Results  Procedure: Real-time Ultrasound Guided Injection of right knee joint superior lateral patella space Device: Philips Affiniti 50G/GE Logiq Images permanently stored and available for review in PACS Moderate effusion and moderate Baker's cyst visible.  Degeneration medial joint line present. Verbal informed consent obtained.  Discussed risks and benefits of procedure. Warned about infection, bleeding, hyperglycemia damage to structures among others. Patient expresses understanding and agreement Time-out conducted.   Noted no overlying erythema, induration, or other signs of local infection.   Skin prepped in a sterile fashion.   Local anesthesia: Topical Ethyl chloride.   With sterile technique and under real time ultrasound  guidance: 40 mg of Kenalog and 2 mL of Marcaine injected into knee joint. Fluid seen entering the joint capsule.   Completed without difficulty   Pain immediately resolved suggesting accurate placement of the medication.   Advised to call if fevers/chills, erythema, induration, drainage, or persistent bleeding.   Images permanently stored and available for review in the ultrasound unit.  Impression: Technically successful ultrasound guided injection.    X-ray images right knee obtained today personally and independently interpreted. 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  Mild medial and lateral DJD.  No acute fractures are visible. Await formal radiology review    Assessment and Plan: 77 y.o. female with chronic right knee pain with acute exacerbation due to exacerbation of DJD.  Plan for steroid injection and topical Voltaren gel.  Talked about safety of the injection and topical NSAIDs with diabetes.  This should be safe.  Would like to avoid multiple injections in 1 day given her diabetes so we will hold off on potential left greater trochanter injection today.  If the left hip does not improve would consider injection in the near future.  Recheck back as needed for right knee.   PDMP not reviewed this encounter. Orders Placed This Encounter  Procedures   [place] LIMITED JOINT SPACE STRUCTURES LOW RIGHT(NO LINKED CHARGES)    Reason for Exam (SYMPTOM  OR DIAGNOSIS REQUIRED):   right knee pain    Preferred imaging location?:   [facility] Knee AP/LAT W/[place] Right    Standing Status:   Future    Expiration  Date:   10/27/2023    Reason for Exam (SYMPTOM  OR DIAGNOSIS REQUIRED):   right  knee pain    Preferred imaging location?:   [facility]   No orders of the defined types were placed in this encounter.    Discussed warning signs or symptoms. Please see discharge instructions. Patient expresses understanding.   The above documentation has  been reviewed and is accurate and complete Clementeen Graham, M.D.

## 2023-09-26 ENCOUNTER — Ambulatory Visit (INDEPENDENT_AMBULATORY_CARE_PROVIDER_SITE_OTHER): Payer: Medicare Other

## 2023-09-26 ENCOUNTER — Ambulatory Visit (INDEPENDENT_AMBULATORY_CARE_PROVIDER_SITE_OTHER): Payer: Medicare Other | Admitting: Family Medicine

## 2023-09-26 ENCOUNTER — Other Ambulatory Visit: Payer: Self-pay

## 2023-09-26 VITALS — BP 142/78 | HR 74 | Ht 67.0 in | Wt 224.0 lb

## 2023-09-26 DIAGNOSIS — M25561 Pain in right knee: Secondary | ICD-10-CM

## 2023-09-26 DIAGNOSIS — M1711 Unilateral primary osteoarthritis, right knee: Secondary | ICD-10-CM

## 2023-09-26 DIAGNOSIS — G8929 Other chronic pain: Secondary | ICD-10-CM

## 2023-09-26 DIAGNOSIS — E1165 Type 2 diabetes mellitus with hyperglycemia: Secondary | ICD-10-CM | POA: Diagnosis not present

## 2023-09-26 DIAGNOSIS — M7062 Trochanteric bursitis, left hip: Secondary | ICD-10-CM

## 2023-09-26 NOTE — Patient Instructions (Addendum)
Thank you for coming in today.   Please get an Xray today before you leave   Please use Voltaren gel (Generic Diclofenac Gel) up to 4x daily for pain as needed.  This is available over-the-counter as both the name brand Voltaren gel and the generic diclofenac gel.   You received an injection today. Seek immediate medical attention if the joint becomes red, extremely painful, or is oozing fluid.   Check back as needed

## 2023-09-29 NOTE — Progress Notes (Signed)
Right knee x-ray shows mild to medium arthritis.

## 2023-10-06 ENCOUNTER — Ambulatory Visit: Payer: Medicare Other | Admitting: Physician Assistant

## 2023-10-22 ENCOUNTER — Encounter: Payer: Self-pay | Admitting: Internal Medicine

## 2023-11-19 LAB — HM MAMMOGRAPHY

## 2023-12-05 ENCOUNTER — Encounter: Payer: Self-pay | Admitting: Internal Medicine

## 2023-12-22 ENCOUNTER — Encounter: Payer: Self-pay | Admitting: Internal Medicine

## 2024-01-01 ENCOUNTER — Encounter: Payer: Self-pay | Admitting: Internal Medicine

## 2024-01-01 NOTE — Telephone Encounter (Signed)
 Called and scheduled Pt for Physical.

## 2024-01-10 ENCOUNTER — Other Ambulatory Visit: Payer: Self-pay | Admitting: Hematology and Oncology

## 2024-01-16 ENCOUNTER — Other Ambulatory Visit: Payer: Self-pay

## 2024-01-16 ENCOUNTER — Other Ambulatory Visit: Payer: Self-pay | Admitting: Internal Medicine

## 2024-01-21 ENCOUNTER — Other Ambulatory Visit (INDEPENDENT_AMBULATORY_CARE_PROVIDER_SITE_OTHER)

## 2024-01-21 DIAGNOSIS — E538 Deficiency of other specified B group vitamins: Secondary | ICD-10-CM | POA: Diagnosis not present

## 2024-01-21 DIAGNOSIS — E559 Vitamin D deficiency, unspecified: Secondary | ICD-10-CM

## 2024-01-21 DIAGNOSIS — E1165 Type 2 diabetes mellitus with hyperglycemia: Secondary | ICD-10-CM

## 2024-01-21 LAB — HEPATIC FUNCTION PANEL
ALT: 9 U/L (ref 0–35)
AST: 15 U/L (ref 0–37)
Albumin: 4.7 g/dL (ref 3.5–5.2)
Alkaline Phosphatase: 61 U/L (ref 39–117)
Bilirubin, Direct: 0 mg/dL (ref 0.0–0.3)
Total Bilirubin: 0.7 mg/dL (ref 0.2–1.2)
Total Protein: 7.3 g/dL (ref 6.0–8.3)

## 2024-01-21 LAB — CBC WITH DIFFERENTIAL/PLATELET
Basophils Absolute: 0 10*3/uL (ref 0.0–0.1)
Basophils Relative: 0.7 % (ref 0.0–3.0)
Eosinophils Absolute: 0.2 10*3/uL (ref 0.0–0.7)
Eosinophils Relative: 5 % (ref 0.0–5.0)
HCT: 40.8 % (ref 36.0–46.0)
Hemoglobin: 13.8 g/dL (ref 12.0–15.0)
Lymphocytes Relative: 46.3 % — ABNORMAL HIGH (ref 12.0–46.0)
Lymphs Abs: 1.5 10*3/uL (ref 0.7–4.0)
MCHC: 33.8 g/dL (ref 30.0–36.0)
MCV: 87.5 fl (ref 78.0–100.0)
Monocytes Absolute: 0.3 10*3/uL (ref 0.1–1.0)
Monocytes Relative: 9.2 % (ref 3.0–12.0)
Neutro Abs: 1.3 10*3/uL — ABNORMAL LOW (ref 1.4–7.7)
Neutrophils Relative %: 38.8 % — ABNORMAL LOW (ref 43.0–77.0)
Platelets: 194 10*3/uL (ref 150.0–400.0)
RBC: 4.66 Mil/uL (ref 3.87–5.11)
RDW: 13 % (ref 11.5–15.5)
WBC: 3.3 10*3/uL — ABNORMAL LOW (ref 4.0–10.5)

## 2024-01-21 LAB — VITAMIN D 25 HYDROXY (VIT D DEFICIENCY, FRACTURES): VITD: 52.73 ng/mL (ref 30.00–100.00)

## 2024-01-21 LAB — BASIC METABOLIC PANEL WITH GFR
BUN: 18 mg/dL (ref 6–23)
CO2: 26 meq/L (ref 19–32)
Calcium: 10.2 mg/dL (ref 8.4–10.5)
Chloride: 104 meq/L (ref 96–112)
Creatinine, Ser: 0.83 mg/dL (ref 0.40–1.20)
GFR: 67.75 mL/min (ref >60.00)
Glucose, Bld: 103 mg/dL — ABNORMAL HIGH (ref 70–99)
Potassium: 3.8 meq/L (ref 3.5–5.1)
Sodium: 140 meq/L (ref 135–145)

## 2024-01-21 LAB — URINALYSIS, ROUTINE W REFLEX MICROSCOPIC
Bilirubin Urine: NEGATIVE
Hgb urine dipstick: NEGATIVE
Ketones, ur: NEGATIVE
Nitrite: NEGATIVE
Specific Gravity, Urine: 1.02 (ref 1.000–1.030)
Total Protein, Urine: NEGATIVE
Urine Glucose: NEGATIVE
Urobilinogen, UA: 0.2 (ref 0.0–1.0)
pH: 6 (ref 5.0–8.0)

## 2024-01-21 LAB — TSH: TSH: 1.58 u[IU]/mL (ref 0.35–5.50)

## 2024-01-21 LAB — LIPID PANEL
Cholesterol: 141 mg/dL (ref 0–200)
HDL: 45 mg/dL (ref >39.00)
LDL Cholesterol: 79 mg/dL (ref 0–99)
NonHDL: 96.28
Total CHOL/HDL Ratio: 3
Triglycerides: 84 mg/dL (ref 0.0–149.0)
VLDL: 16.8 mg/dL (ref 0.0–40.0)

## 2024-01-21 LAB — HEMOGLOBIN A1C: Hgb A1c MFr Bld: 6.1 % (ref 4.6–6.5)

## 2024-01-21 LAB — VITAMIN B12: Vitamin B-12: 1201 pg/mL — ABNORMAL HIGH (ref 211–911)

## 2024-01-22 ENCOUNTER — Encounter: Payer: Self-pay | Admitting: Internal Medicine

## 2024-02-02 ENCOUNTER — Ambulatory Visit (INDEPENDENT_AMBULATORY_CARE_PROVIDER_SITE_OTHER): Admitting: Internal Medicine

## 2024-02-02 ENCOUNTER — Encounter: Payer: Self-pay | Admitting: Internal Medicine

## 2024-02-02 VITALS — BP 128/76 | HR 75 | Temp 98.2°F | Ht 67.0 in | Wt 225.0 lb

## 2024-02-02 DIAGNOSIS — Z0000 Encounter for general adult medical examination without abnormal findings: Secondary | ICD-10-CM

## 2024-02-02 DIAGNOSIS — E538 Deficiency of other specified B group vitamins: Secondary | ICD-10-CM | POA: Diagnosis not present

## 2024-02-02 DIAGNOSIS — E1165 Type 2 diabetes mellitus with hyperglycemia: Secondary | ICD-10-CM | POA: Diagnosis not present

## 2024-02-02 DIAGNOSIS — I10 Essential (primary) hypertension: Secondary | ICD-10-CM

## 2024-02-02 DIAGNOSIS — Z0001 Encounter for general adult medical examination with abnormal findings: Secondary | ICD-10-CM

## 2024-02-02 DIAGNOSIS — E559 Vitamin D deficiency, unspecified: Secondary | ICD-10-CM

## 2024-02-02 DIAGNOSIS — E7800 Pure hypercholesterolemia, unspecified: Secondary | ICD-10-CM

## 2024-02-02 NOTE — Assessment & Plan Note (Signed)
 Last vitamin D  Lab Results  Component Value Date   VD25OH 52.73 01/21/2024   Stable, cont oral replacement

## 2024-02-02 NOTE — Assessment & Plan Note (Signed)

## 2024-02-02 NOTE — Assessment & Plan Note (Signed)
 Lab Results  Component Value Date   HGBA1C 6.1 01/21/2024   Stable, pt to continue current medical treatment  - diet, wt control

## 2024-02-02 NOTE — Progress Notes (Signed)
 Patient ID: Connie Marshall, female   DOB: 1946/07/25, 78 y.o.   MRN: [id number]         Chief Complaint:: wellness exam and dm, htn, hld, low b12 and D       HPI:  Connie Marshall is a 78 y.o. female here for wellness exam; up to date                        Also Pt denies chest pain, increased sob or doe, wheezing, orthopnea, PND, increased LE swelling, palpitations, dizziness or syncope.   Pt denies polydipsia, polyuria, or new focal neuro s/s.    Pt denies fever, wt loss, night sweats, loss of appetite, or other constitutional symptoms  Taking b12 supplement daily   Wt Readings from Last 3 Encounters:  02/02/24 225 lb (102.1 kg)  09/26/23 224 lb (101.6 kg)  06/25/23 223 lb (101.2 kg)   BP Readings from Last 3 Encounters:  02/02/24 128/76  09/26/23 (!) 142/78  06/25/23 110/72   Immunization History  Administered Date(s) Administered   Fluad Quad(high Dose 65+) 07/05/2019, 08/12/2020, 07/18/2022, 07/16/2023   Influenza Split 08/05/2011, 08/20/2012   Influenza Whole 07/18/2009   Influenza, High Dose Seasonal PF 09/06/2013, 09/13/2015, 08/12/2016, 07/16/2017, 07/21/2018   Influenza,inj,Quad PF,6+ Mos 09/07/2014   Influenza-Unspecified 07/24/2021   PFIZER(Purple Top)SARS-COV-2 Vaccination 11/18/2019, 12/09/2019, 09/29/2020   Pfizer Covid-19 Vaccine Bivalent Booster 7yrs & up 07/10/2021, 08/01/2022, 07/28/2023   Pneumococcal Conjugate-13 09/27/2013   Pneumococcal Polysaccharide-23 08/05/2011   Td 10/15/2006   Tdap 09/29/2017   Zoster Recombinant(Shingrix) 02/04/2022, 04/10/2022   Zoster, Live 08/02/2010   Health Maintenance Due  Topic Date Due   Diabetic kidney evaluation - Urine ACR  01/28/2024      Past Medical History:  Diagnosis Date   Axillary mass, left    Cancer (HCC)    Complication of anesthesia    Diverticulosis    Hemorrhoids    Hyperlipidemia    Hypertension    IBS (irritable bowel syndrome)    Obesity    PONV (postoperative nausea and vomiting)    Pre-diabetes     Psoriasis    Shingles 02/19/2021   Past Surgical History:  Procedure Laterality Date   BREAST LUMPECTOMY WITH RADIOACTIVE SEED AND SENTINEL LYMPH NODE BIOPSY Bilateral 10/05/2019   Procedure: BILATERAL BREAST LUMPECTOMY WITH BILATERAL RADIOACTIVE SEEDS AND LEFT SENTINEL LYMPH NODE BIOPSY;  Surgeon: Oza Blumenthal, MD;  Location: [facility];  Service: General;  Laterality: Bilateral;   BREAST SURGERY     CESAREAN SECTION     2 times   CHOLECYSTECTOMY     laproscopic   CHOLECYSTECTOMY, LAPAROSCOPIC     kidney stone removed  10/14/2009   MASS EXCISION Left 07/12/2019   Procedure: EXCISION LEFT AXILLARY MASS;  Surgeon: Oza Blumenthal, MD;  Location: [facility];  Service: General;  Laterality: Left;   RE-EXCISION OF BREAST LUMPECTOMY Left 10/05/2019   Procedure: RE-EXCISION OF LEFT BREAST CANCER;  Surgeon: Oza Blumenthal, MD;  Location: [facility];  Service: General;  Laterality: Left;   THYROGLOSSAL DUCT CYST  10/14/1998   TONSILLECTOMY  10/14/1977   TOTAL HIP ARTHROPLASTY Right 05/14/2022   Procedure: RIGHT TOTAL HIP ARTHROPLASTY ANTERIOR APPROACH;  Surgeon: Arnie Lao, MD;  Location: [facility];  Service: Orthopedics;  Laterality: Right;   umbilical herniorrhapy     VAGINAL HYSTERECTOMY  10/14/2001    reports that she has never smoked. She has never used smokeless tobacco. She  reports that she does not drink alcohol and does not use drugs. family history includes Diabetes in her father; Hypertension in her father and mother; Parkinson's disease in her sister; Prostate cancer in her brother. Allergies  Allergen Reactions   Tape     ALLERGIC TO NON-ALLERGIC TAPE-causes a rash   Silicone Rash   Current Outpatient Medications on File Prior to Visit  Medication Sig Dispense Refill   acetaminophen  (TYLENOL ) 650 MG CR tablet Take 1,300 mg by mouth 2 (two) times daily.     anastrozole  (ARIMIDEX ) 1 MG tablet TAKE 1 TABLET(1 MG)  BY MOUTH DAILY 90 tablet 3   B Complex-C (SUPER B COMPLEX PO) Take 1 capsule by mouth daily.     Cholecalciferol  (VITAMIN D3 SUPER STRENGTH) 50 MCG (2000 UT) TABS Take 4,000 Units by mouth daily.     cyanocobalamin  2000 MCG tablet Take 2,000 mcg by mouth daily.     fosinopril  (MONOPRIL ) 20 MG tablet TAKE 1 TABLET(20 MG) BY MOUTH DAILY 90 tablet 3   furosemide  (LASIX ) 40 MG tablet 1 tab by mouth in the AM, and 1 in the PM as needed for leg swelling 180 tablet 3   latanoprost (XALATAN) 0.005 % ophthalmic solution 1 drop at bedtime.     Misc Natural Products (TURMERIC CURCUMIN) CAPS Take 1 capsule by mouth daily. Take for immune system     simvastatin  (ZOCOR ) 20 MG tablet TAKE 1 TABLET(20 MG) BY MOUTH EVERY EVENING 90 tablet 3   hydrALAZINE  (APRESOLINE ) 10 MG tablet Take 1 tablet (10 mg total) by mouth 3 (three) times daily as needed (If blood pressure over 170 and you are feeling symptomatic.). (Patient not taking: Reported on 02/02/2024) 21 tablet 0   No current facility-administered medications on file prior to visit.        ROS:  All others reviewed and negative.  Objective        PE:  BP 128/76 (BP Location: Right Arm, Patient Position: Sitting, Cuff Size: Normal)   Pulse 75   Temp 98.2 F (36.8 C) (Oral)   Ht 5\' 7"  (1.702 m)   Wt 225 lb (102.1 kg)   SpO2 98%   BMI 35.24 kg/m                 Constitutional: Pt appears in NAD               HENT: Head: NCAT.                Right Ear: External ear normal.                 Left Ear: External ear normal.                Eyes: . Pupils are equal, round, and reactive to light. Conjunctivae and EOM are normal               Nose: without d/c or deformity               Neck: Neck supple. Gross normal ROM               Cardiovascular: Normal rate and regular rhythm.                 Pulmonary/Chest: Effort normal and breath sounds without rales or wheezing.                Abd:  Soft, NT, ND, + BS, no organomegaly  Neurological:  Pt is alert. At baseline orientation, motor grossly intact               Skin: Skin is warm. No rashes, no other new lesions, LE edema - none               Psychiatric: Pt behavior is normal without agitation   Micro: none  Cardiac tracings I have personally interpreted today:  none  Pertinent Radiological findings (summarize): none   Lab Results  Component Value Date   WBC 3.3 (L) 01/21/2024   HGB 13.8 01/21/2024   HCT 40.8 01/21/2024   PLT 194.0 01/21/2024   GLUCOSE 103 (H) 01/21/2024   CHOL 141 01/21/2024   TRIG 84.0 01/21/2024   HDL 45.00 01/21/2024   LDLDIRECT 115.2 09/26/2008   LDLCALC 79 01/21/2024   ALT 9 01/21/2024   AST 15 01/21/2024   NA 140 01/21/2024   K 3.8 01/21/2024   CL 104 01/21/2024   CREATININE 0.83 01/21/2024   BUN 18 01/21/2024   CO2 26 01/21/2024   TSH 1.58 01/21/2024   HGBA1C 6.1 01/21/2024   MICROALBUR 0.8 01/28/2023   Assessment/Plan:  Brit Carbonell is a 78 y.o. Black or African American [2] female with  has a past medical history of Axillary mass, left, Cancer (HCC), Complication of anesthesia, Diverticulosis, Hemorrhoids, Hyperlipidemia, Hypertension, IBS (irritable bowel syndrome), Obesity, PONV (postoperative nausea and vomiting), Pre-diabetes, Psoriasis, and Shingles (02/19/2021).  Encounter for well adult exam with abnormal findings Age and sex appropriate education and counseling updated with regular exercise and diet Referrals for preventative services - none needed Immunizations addressed - none needed Smoking counseling  - none needed Evidence for depression or other mood disorder - none significant Most recent labs reviewed. I have personally reviewed and have noted: 1) the patient's medical and social history 2) The patient's current medications and supplements 3) The patient's height, weight, and BMI have been recorded in the chart   Diabetes [facility]) Lab Results  Component Value Date   HGBA1C 6.1 01/21/2024   Stable, pt to  continue current medical treatment  - diet, wt control   Hyperlipidemia Lab Results  Component Value Date   LDLCALC 79 01/21/2024   uncontrolled, pt to work on improved DM low chol diet, cont zocor  20 every day as has done well with this in past   Essential hypertension BP Readings from Last 3 Encounters:  02/02/24 128/76  09/26/23 (!) 142/78  06/25/23 110/72   Stable, pt to continue medical treatment monopril  20 every day, hydralazine  10 tid   B12 deficiency Lab Results  Component Value Date   VITAMINB12 1,201 (H) 01/21/2024   Overcontrolled,  cont oral replacement - b12 1000 mcg but decfease to qod   Vitamin D  deficiency Last vitamin D  Lab Results  Component Value Date   VD25OH 52.73 01/21/2024   Stable, cont oral replacement  Followup: Return in about 6 months (around 08/03/2024).  Rosalia Colonel, MD 02/02/2024 3:05 PM [facility]

## 2024-02-02 NOTE — Assessment & Plan Note (Signed)
 Lab Results  Component Value Date   VITAMINB12 1,201 (H) 01/21/2024   Overcontrolled,  cont oral replacement - b12 1000 mcg but decfease to qod

## 2024-02-02 NOTE — Assessment & Plan Note (Signed)
 BP Readings from Last 3 Encounters:  02/02/24 128/76  09/26/23 (!) 142/78  06/25/23 110/72   Stable, pt to continue medical treatment monopril  20 every day, hydralazine  10 tid

## 2024-02-02 NOTE — Assessment & Plan Note (Signed)
 Lab Results  Component Value Date   LDLCALC 79 01/21/2024   uncontrolled, pt to work on improved DM low chol diet, cont zocor  20 every day as has done well with this in past

## 2024-02-02 NOTE — Patient Instructions (Addendum)
 Ok to decrease the B12 to every other day  Please continue all other medications as before, and refills have been done if requested.  Please have the pharmacy call with any other refills you may need.  Please continue your efforts at being more active, low cholesterol diet, and weight control.  You are otherwise up to date with prevention measures today.  Please keep your appointments with your specialists as you may have planned  Please make an Appointment to return in 6 months, or sooner if needed, also with Lab Appointment for testing done 3-5 days before at the FIRST FLOOR Lab (so this is for TWO appointments - please see the scheduling desk as you leave)

## 2024-04-10 ENCOUNTER — Other Ambulatory Visit: Payer: Self-pay | Admitting: Internal Medicine

## 2024-04-12 ENCOUNTER — Telehealth: Payer: Self-pay | Admitting: Internal Medicine

## 2024-04-12 ENCOUNTER — Telehealth: Payer: Self-pay

## 2024-04-12 ENCOUNTER — Other Ambulatory Visit: Payer: Self-pay

## 2024-04-12 ENCOUNTER — Other Ambulatory Visit (HOSPITAL_COMMUNITY): Payer: Self-pay

## 2024-04-12 NOTE — Telephone Encounter (Signed)
 Noted

## 2024-04-12 NOTE — Telephone Encounter (Signed)
 Copied from CRM [id number]. Topic: Clinical - Prescription Issue >> Apr 12, 2024 10:10 AM Tonda B wrote: Reason for CRM: prior authorization is needed for furosemide  (LASIX ) 40 MG tablet

## 2024-04-12 NOTE — Telephone Encounter (Signed)
 Pharmacy Patient Advocate Encounter   Received notification from Pt Calls Messages that prior authorization for Furosemide  40mg  is required/requested.   Insurance verification completed.   The patient is insured through [employer] .   Per test claim: Refill too soon. PA is not needed at this time. Medication was filled 04/12/24. Next eligible fill date is 06/19/24.

## 2024-04-19 NOTE — Assessment & Plan Note (Signed)
 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 Left breast reexcision: IDC grade 2, 1.7 cm, intermediate grade DCIS, margins clear, 0/6 lymph nodes negative, ER 95%, PR 95%, HER-2 negative, Ki-67 15%, T3N0 Stage Ib pathologic stage   Oncotype score: 0, distant recurrence at 9 years: 3% Adjuvant radiation: 11/23/2019-12/21/2019   Treatment plan: Adjuvant antiestrogen therapy with anastrozole  1 mg daily x5 years started march 2021 Anastrozole  Toxicities: 1.  Diffuse muscle aches and stiffness: Takes Advil Tylenol  and turmeric status post right hip replacement surgery and doing significantly better with this. 2.  Hot flashes: Resolved   Breast Cancer Surveillance: Mammograms 11/19/2023: Benign.  Breast density category B Bone Density: 11/09/20:T score -0.2  Breast exam 04/20/2024: Marked lymphedema in the left breast.  I recommended physical therapy evaluation and consideration for a lymphedema sleeve to reduce the swelling.   Return to clinic in 1 year for follow-up

## 2024-04-20 ENCOUNTER — Inpatient Hospital Stay: Payer: Medicare Other | Attending: Hematology and Oncology | Admitting: Hematology and Oncology

## 2024-04-20 VITALS — BP 130/61 | HR 64 | Temp 98.1°F | Resp 17 | Ht 67.0 in | Wt 228.3 lb

## 2024-04-20 DIAGNOSIS — Z79899 Other long term (current) drug therapy: Secondary | ICD-10-CM | POA: Diagnosis not present

## 2024-04-20 DIAGNOSIS — Z1732 Human epidermal growth factor receptor 2 negative status: Secondary | ICD-10-CM | POA: Insufficient documentation

## 2024-04-20 DIAGNOSIS — Z170 Estrogen receptor positive status [ER+]: Secondary | ICD-10-CM | POA: Diagnosis not present

## 2024-04-20 DIAGNOSIS — Z1721 Progesterone receptor positive status: Secondary | ICD-10-CM | POA: Insufficient documentation

## 2024-04-20 DIAGNOSIS — C50911 Malignant neoplasm of unspecified site of right female breast: Secondary | ICD-10-CM

## 2024-04-20 DIAGNOSIS — I890 Lymphedema, not elsewhere classified: Secondary | ICD-10-CM | POA: Diagnosis not present

## 2024-04-20 DIAGNOSIS — C50412 Malignant neoplasm of upper-outer quadrant of left female breast: Secondary | ICD-10-CM | POA: Diagnosis present

## 2024-04-20 DIAGNOSIS — C50912 Malignant neoplasm of unspecified site of left female breast: Secondary | ICD-10-CM | POA: Diagnosis not present

## 2024-04-20 DIAGNOSIS — Z79811 Long term (current) use of aromatase inhibitors: Secondary | ICD-10-CM | POA: Diagnosis not present

## 2024-04-20 NOTE — Progress Notes (Signed)
 Patient Care Team: Norleen Lynwood ORN, MD as PCP - General (Internal Medicine) Ceil Anes, MD (Inactive) (Urology) Floy Lynwood PARAS, MD (Otolaryngology) Glean Stephane BROCKS, RN (Inactive) as Oncology Nurse Navigator Tyree Nanetta SAILOR, RN as Oncology Nurse Navigator Ethyl Lonni BRAVO, MD (Inactive) as Consulting Physician (Otolaryngology) Burundi, Heather, OD as Consulting Physician (Optometry) Crawford, Morna Pickle, NP as Nurse Practitioner (Hematology and Oncology)  DIAGNOSIS:  Encounter Diagnosis  Name Primary?   Bilateral malignant neoplasm of breast in female, estrogen receptor positive, unspecified site of breast (HCC) Yes    SUMMARY OF ONCOLOGIC HISTORY: Oncology History  Bilateral breast cancer (HCC)  07/12/2019 Initial Diagnosis   Left axillary soft tissue excision: Invasive ductal carcinoma lymphoid tissue not present   08/02/2019 Breast MRI   Right breast enhancing linear mass 6 x 6 x 13 mm, left breast enhancing irregular mass 5 x 15 x 5 mm, no abnormal appearing lymph nodes   08/19/2019 Cancer Staging   Staging form: Breast, AJCC 8th Edition - Clinical stage from 08/19/2019: Stage IIA (cT3, cN0, cM0, G2, ER+, PR+, HER2-) - Signed by Odean Potts, MD on 08/19/2019   10/05/2019 Surgery   Right lumpectomy: 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 Left breast reexcision: IDC grade 2, 1.7 cm, intermediate grade DCIS, margins clear, 0/6 lymph nodes negative, ER 95%, PR 95%, HER-2 negative, Ki-67 15%, T3N0   10/14/2019 Cancer Staging   Staging form: Breast, AJCC 8th Edition - Pathologic stage from 10/14/2019: Stage IB (pT3, pN0(sn), cM0, G2, ER+, PR+, HER2-) - Signed by Odean Potts, MD on 10/14/2019   11/23/2019 - 12/22/2019 Radiation Therapy   Adjuvant radiation   12/21/2019 -  Anti-estrogen oral therapy   Anastrozole , 1mg  daily, planned duration 5 years   Carcinoma of upper-outer  quadrant of left breast in female, estrogen receptor positive (HCC)  08/25/2019 Initial Diagnosis   Carcinoma of upper-outer quadrant of left breast in female, estrogen receptor positive (HCC)   08/25/2019 Cancer Staging   Staging form: Breast, AJCC 8th Edition - Clinical: Stage IIA (cT3, cN0, cM0, G2, ER+, PR+, HER2-) - Signed by Izell Domino, MD on 08/25/2019   09/02/2019 Oncotype testing   Oncotype: score of 0 with a 3% chance of distant recurrence in 9 years with antiestrogen therapy alone.    10/05/2019 Surgery   Bilateral lumpectomies Jan):  Right breast: intraductal papilloma and no evidence of malignancy  Left breast: IDC with DCIS, grade 2, 1.7cm, clear margins, 6 left axillary lymph nodes negative      CHIEF COMPLIANT: Surveillance of breast cancer on antiestrogen therapy  HISTORY OF PRESENT ILLNESS:   History of Present Illness Connie Marshall is a 78 year old female with breast cancer who presents for follow-up regarding her treatment and lymphedema management.  She has been on anastrozole  for nearly five years, initially prescribed based on an oncotype test indicating a very low risk of recurrence. She tolerates the medication well and is considering whether to continue the treatment beyond the five-year mark.  Lymphedema persists in the breast area despite therapy sessions until October of last year. A compression garment was fitted but did not provide adequate support or relief.  She experiences some stiffness in her legs, attributed to prior hip surgery. No current hot flashes.     ALLERGIES:  is allergic to tape and silicone.  MEDICATIONS:  Current Outpatient Medications  Medication Sig Dispense Refill   acetaminophen  (TYLENOL ) 650 MG CR tablet Take 1,300 mg  by mouth 2 (two) times daily.     anastrozole  (ARIMIDEX ) 1 MG tablet TAKE 1 TABLET(1 MG) BY MOUTH DAILY 90 tablet 3   B Complex-C (SUPER B COMPLEX PO) Take 1 capsule by mouth daily.     Cholecalciferol   (VITAMIN D3 SUPER STRENGTH) 50 MCG (2000 UT) TABS Take 4,000 Units by mouth daily.     cyanocobalamin  2000 MCG tablet Take 2,000 mcg by mouth daily.     fosinopril  (MONOPRIL ) 20 MG tablet TAKE 1 TABLET(20 MG) BY MOUTH DAILY 90 tablet 3   furosemide  (LASIX ) 40 MG tablet TAKE 1 TABLET IN THE MORNING AND 1 IN THE EVENING AS NEEDED FOR LEG SWELLING 180 tablet 3   latanoprost (XALATAN) 0.005 % ophthalmic solution 1 drop at bedtime.     Misc Natural Products (TURMERIC CURCUMIN) CAPS Take 1 capsule by mouth daily. Take for immune system     simvastatin  (ZOCOR ) 20 MG tablet TAKE 1 TABLET(20 MG) BY MOUTH EVERY EVENING 90 tablet 3   No current facility-administered medications for this visit.    PHYSICAL EXAMINATION: ECOG PERFORMANCE STATUS: 1 - Symptomatic but completely ambulatory  Vitals:   04/20/24 0959  BP: 130/61  Pulse: 64  Resp: 17  Temp: 98.1 F (36.7 C)  SpO2: 96%   Filed Weights   04/20/24 0959  Weight: 228 lb 4.8 oz (103.6 kg)    Physical Exam BREAST: Breasts with lymphedema at the bottom.  (exam performed in the presence of a chaperone)  LABORATORY DATA:  I have reviewed the data as listed    Latest Ref Rng & Units 01/21/2024    9:37 AM 04/24/2023   10:35 AM 01/28/2023    7:55 AM  CMP  Glucose 70 - 99 mg/dL 896  885  880   BUN 6 - 23 mg/dL 18  15  20    Creatinine 0.40 - 1.20 mg/dL 9.16  9.32  9.29   Sodium 135 - 145 mEq/L 140  138  137   Potassium 3.5 - 5.1 mEq/L 3.8  3.9  4.2   Chloride 96 - 112 mEq/L 104  106  106   CO2 19 - 32 mEq/L 26  24  24    Calcium 8.4 - 10.5 mg/dL 89.7  89.6  9.8   Total Protein 6.0 - 8.3 g/dL 7.3   7.0   Total Bilirubin 0.2 - 1.2 mg/dL 0.7   0.5   Alkaline Phos 39 - 117 U/L 61   58   AST 0 - 37 U/L 15   17   ALT 0 - 35 U/L 9   13     Lab Results  Component Value Date   WBC 3.3 (L) 01/21/2024   HGB 13.8 01/21/2024   HCT 40.8 01/21/2024   MCV 87.5 01/21/2024   PLT 194.0 01/21/2024   NEUTROABS 1.3 (L) 01/21/2024    ASSESSMENT &  PLAN:  Bilateral breast cancer (HCC) 07/12/2019:Left axillary soft tissue excision: Invasive ductal carcinoma lymphoid tissue not present ER/PR positive HER-2 equivocal by IHC, FISH pending Because the primary size is at least more than 5 cm, T3N0   10/05/2019:Right lumpectomy: 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 Left breast reexcision: IDC grade 2, 1.7 cm, intermediate grade DCIS, margins clear, 0/6 lymph nodes negative, ER 95%, PR 95%, HER-2 negative, Ki-67 15%, T3N0 Stage Ib pathologic stage   Oncotype score: 0, distant recurrence at 9 years: 3% Adjuvant radiation: 11/23/2019-12/21/2019   Treatment plan: Adjuvant antiestrogen  therapy with anastrozole  1 mg daily started march 2021 Anastrozole  Toxicities: 1.  Diffuse muscle aches and stiffness: Takes Advil Tylenol  and turmeric status post right hip replacement surgery and doing significantly better with this. 2.  Hot flashes: Resolved Next year we will discuss the duration of therapy.  If she is tolerating it well we may consider extending it to 7 years.   Breast Cancer Surveillance: Mammograms 11/19/2023: Benign.  Breast density category B Bone Density: 11/09/20:T score -0.2  Breast exam 04/20/2024: Marked lymphedema in the left breast.  Physical therapy was attempted but she did not find any improvement.  She is contemplating on doing bilateral mastectomies.  If she decides on it she will call Dr. Vernetta.   Return to clinic in 1 year for follow-up ------------------------------------- Assessment and Plan Assessment & Plan Bilateral malignant neoplasm of breast, estrogen receptor positive On anastrozole , well-tolerated. Oncotype test shows very low recurrence risk. Considering extending treatment to seven years. - Continue anastrozole  until March next year. - Ensure refills available until next year. - Discuss continuation of anastrozole  therapy  in June or July next year. - Continue regular mammograms in February.  Lymphedema Persistent lymphedema, compression garments ineffective. Likely exacerbated by breast size and weight. Surgical options discussed. - Continue massages to manage lymphedema. - Consider surgical consultation for breast reduction if desired. - Discuss surgical options with Dr. Vicenta Vernetta if she decides to pursue surgery.      No orders of the defined types were placed in this encounter.  The patient has a good understanding of the overall plan. she agrees with it. she will call with any problems that may develop before the next visit here. Total time spent: 30 mins including face to face time and time spent for planning, charting and co-ordination of care   Connie MARLA Chad, MD 04/20/24

## 2024-05-11 ENCOUNTER — Ambulatory Visit (INDEPENDENT_AMBULATORY_CARE_PROVIDER_SITE_OTHER)

## 2024-05-11 VITALS — Ht 67.0 in | Wt 228.0 lb

## 2024-05-11 DIAGNOSIS — E1165 Type 2 diabetes mellitus with hyperglycemia: Secondary | ICD-10-CM | POA: Diagnosis not present

## 2024-05-11 DIAGNOSIS — Z0000 Encounter for general adult medical examination without abnormal findings: Secondary | ICD-10-CM | POA: Diagnosis not present

## 2024-05-11 NOTE — Patient Instructions (Signed)
 Connie Marshall , Thank you for taking time out of your busy schedule to complete your Annual Wellness Visit with me. I enjoyed our conversation and look forward to speaking with you again next year. I, as well as your care team,  appreciate your ongoing commitment to your health goals. Please review the following plan we discussed and let me know if I can assist you in the future. Your Game plan/ To Do List   Follow up Visits: Next Medicare AWV with our clinical staff: 05/12/2025.   Have you seen your provider in the last 6 months (3 months if uncontrolled diabetes)? Yes Next Office Visit with your provider: Patient stated that she will call to schedule her 6 month follow up visit.  Last visit on 02/02/24.  Clinician Recommendations:  Aim for 30 minutes of exercise or brisk walking, 6-8 glasses of water, and 5 servings of fruits and vegetables each day. You are due for a kidney evaluation and will have that done during your next office visit.        This is a list of the screening recommended for you and due dates:  Health Maintenance  Topic Date Due   Yearly kidney health urinalysis for diabetes  Never done   Flu Shot  05/14/2024   Hemoglobin A1C  07/22/2024   Eye exam for diabetics  10/19/2024   Yearly kidney function blood test for diabetes  01/20/2025   Complete foot exam   02/01/2025   Medicare Annual Wellness Visit  05/11/2025   DTaP/Tdap/Td vaccine (3 - Td or Tdap) 09/30/2027   Pneumococcal Vaccine for age over 67  Completed   DEXA scan (bone density measurement)  Completed   Hepatitis C Screening  Completed   Zoster (Shingles) Vaccine  Completed   Hepatitis B Vaccine  Aged Out   HPV Vaccine  Aged Out   Meningitis B Vaccine  Aged Out   Colon Cancer Screening  Discontinued   COVID-19 Vaccine  Discontinued    Advanced directives: (Copy Requested) Please bring a copy of your health care power of attorney and living will to the office to be added to your chart at your convenience. You  can mail to [facility] [address] [place], [place] [address] or email to [email or web address] Advance Care Planning is important because it:  [x]  Makes sure you receive the medical care that is consistent with your values, goals, and preferences  [x]  It provides guidance to your family and loved ones and reduces their decisional burden about whether or not they are making the right decisions based on your wishes.  Follow the link provided in your after visit summary or read over the paperwork we have mailed to you to help you started getting your Advance Directives in place. If you need assistance in completing these, please reach out to us  so that we can help you!  See attachments for Preventive Care and Fall Prevention Tips.

## 2024-05-11 NOTE — Progress Notes (Signed)
 Subjective:   Connie Marshall is a 78 y.o. who presents for a Medicare Wellness preventive visit.  As a reminder, Annual Wellness Visits don't include a physical exam, and some assessments may be limited, especially if this visit is performed virtually. We may recommend an in-person follow-up visit with your provider if needed.  Visit Complete: Virtual I connected with  Connie Marshall on 05/11/24 by a audio enabled telemedicine application and verified that I am speaking with the correct person using two identifiers.  Patient Location: Home  Provider Location: Home Office  I discussed the limitations of evaluation and management by telemedicine. The patient expressed understanding and agreed to proceed.  Vital Signs: Because this visit was a virtual/telehealth visit, some criteria may be missing or patient reported. Any vitals not documented were not able to be obtained and vitals that have been documented are patient reported.  VideoDeclined- This patient declined Librarian, academic. Therefore the visit was completed with audio only.  Persons Participating in Visit: Patient.  AWV Questionnaire: No: Patient Medicare AWV questionnaire was not completed prior to this visit.  Cardiac Risk Factors include: advanced age (>84men, >76 women);dyslipidemia;hypertension     Objective:    Today's Vitals   05/11/24 1511  Weight: 228 lb (103.4 kg)  Height: 5' 7 (1.702 m)   Body mass index is 35.71 kg/m.     05/11/2024    3:19 PM 04/20/2024   10:03 AM 04/20/2024   10:01 AM 06/09/2023    9:55 AM 05/21/2023    9:49 AM 04/24/2023   10:28 AM 06/07/2022   11:25 AM  Advanced Directives  Does Patient Have a Medical Advance Directive? Yes Yes No Yes No Yes Yes  Type of Estate agent of [place];Living will Living will  Healthcare Power of [place];Living will   Healthcare Power of [place];Living will  Copy of Healthcare Power of Attorney in Chart? No -  copy requested   No - copy requested   No - copy requested  Would patient like information on creating a medical advance directive? No - Patient declined No - Patient declined         Current Medications (verified) Outpatient Encounter Medications as of 05/11/2024  Medication Sig   acetaminophen  (TYLENOL ) 650 MG CR tablet Take 1,300 mg by mouth 2 (two) times daily. (Patient taking differently: Take 1,300 mg by mouth as needed.)   anastrozole  (ARIMIDEX ) 1 MG tablet TAKE 1 TABLET(1 MG) BY MOUTH DAILY   B Complex-C (SUPER B COMPLEX PO) Take 1 capsule by mouth daily.   Cholecalciferol  (VITAMIN D3 SUPER STRENGTH) 50 MCG (2000 UT) TABS Take 4,000 Units by mouth daily.   cyanocobalamin  2000 MCG tablet Take 2,000 mcg by mouth daily.   fosinopril  (MONOPRIL ) 20 MG tablet TAKE 1 TABLET(20 MG) BY MOUTH DAILY   furosemide  (LASIX ) 40 MG tablet TAKE 1 TABLET IN THE MORNING AND 1 IN THE EVENING AS NEEDED FOR LEG SWELLING   latanoprost (XALATAN) 0.005 % ophthalmic solution 1 drop at bedtime.   Misc Natural Products (TURMERIC CURCUMIN) CAPS Take 1 capsule by mouth daily. Take for immune system   simvastatin  (ZOCOR ) 20 MG tablet TAKE 1 TABLET(20 MG) BY MOUTH EVERY EVENING   No facility-administered encounter medications on file as of 05/11/2024.    Allergies (verified) Tape and Silicone   History: Past Medical History:  Diagnosis Date   Axillary mass, left    Cancer (HCC)    Complication of anesthesia    Diverticulosis  Hemorrhoids    Hyperlipidemia    Hypertension    IBS (irritable bowel syndrome)    Obesity    PONV (postoperative nausea and vomiting)    Pre-diabetes    Psoriasis    Shingles 02/19/2021   Past Surgical History:  Procedure Laterality Date   BREAST LUMPECTOMY WITH RADIOACTIVE SEED AND SENTINEL LYMPH NODE BIOPSY Bilateral 10/05/2019   Procedure: BILATERAL BREAST LUMPECTOMY WITH BILATERAL RADIOACTIVE SEEDS AND LEFT SENTINEL LYMPH NODE BIOPSY;  Surgeon: Vernetta Berg, MD;   Location: [facility];  Service: General;  Laterality: Bilateral;   BREAST SURGERY     CESAREAN SECTION     2 times   CHOLECYSTECTOMY     laproscopic   CHOLECYSTECTOMY, LAPAROSCOPIC     kidney stone removed  10/14/2009   MASS EXCISION Left 07/12/2019   Procedure: EXCISION LEFT AXILLARY MASS;  Surgeon: Vernetta Berg, MD;  Location: [facility];  Service: General;  Laterality: Left;   RE-EXCISION OF BREAST LUMPECTOMY Left 10/05/2019   Procedure: RE-EXCISION OF LEFT BREAST CANCER;  Surgeon: Vernetta Berg, MD;  Location: [facility];  Service: General;  Laterality: Left;   THYROGLOSSAL DUCT CYST  10/14/1998   TONSILLECTOMY  10/14/1977   TOTAL HIP ARTHROPLASTY Right 05/14/2022   Procedure: RIGHT TOTAL HIP ARTHROPLASTY ANTERIOR APPROACH;  Surgeon: Vernetta Lonni GRADE, MD;  Location: [facility];  Service: Orthopedics;  Laterality: Right;   umbilical herniorrhapy     VAGINAL HYSTERECTOMY  10/14/2001   Family History  Problem Relation Age of Onset   Hypertension Mother    Diabetes Father    Hypertension Father    Parkinson's disease Sister    Prostate cancer Brother    Social History   Socioeconomic History   Marital status: Widowed    Spouse name: Not on file   Number of children: 2   Years of education: college   Highest education level: Not on file  Occupational History   Occupation: Retired  Tobacco Use   Smoking status: Never   Smokeless tobacco: Never  Vaping Use   Vaping status: Never Used  Substance and Sexual Activity   Alcohol use: No   Drug use: No   Sexual activity: Not Currently  Other Topics Concern   Not on file  Social History Narrative   HSG, [employer] for 2 years. Married, '68-2 years- widowed; '76- 17 years/ widowed '93. 1 son- '77, 1 daughter- '78, 4 grandchildren. 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 Lives alone - I- ADLS.      Lives alone/2025   Social Drivers of Health    Financial Resource Strain: Low Risk  (05/11/2024)   Overall Financial Resource Strain (CARDIA)    Difficulty of Paying Living Expenses: Not hard at all  Food Insecurity: No Food Insecurity (05/11/2024)   Hunger Vital Sign    Worried About Running Out of Food in the Last Year: Never true    Ran Out of Food in the Last Year: Never true  Transportation Needs: No Transportation Needs (05/11/2024)   PRAPARE - Administrator, Civil Service (Medical): No    Lack of Transportation (Non-Medical): No  Physical Activity: Insufficiently Active (05/11/2024)   Exercise Vital Sign    Days of Exercise per Week: 7 days    Minutes of Exercise per Session: 20 min  Stress: No Stress Concern Present (05/11/2024)   [employer] of Occupational Health - Occupational Stress Questionnaire    Feeling of Stress: Not at  all  Social Connections: Moderately Integrated (05/11/2024)   Social Connection and Isolation Panel    Frequency of Communication with Friends and Family: More than three times a week    Frequency of Social Gatherings with Friends and Family: Three times a week    Attends Religious Services: More than 4 times per year    Active Member of Clubs or Organizations: Yes    Attends Banker Meetings: Never    Marital Status: Widowed    Tobacco Counseling Counseling given: Not Answered    Clinical Intake:  Pre-visit preparation completed: Yes  Pain : No/denies pain     BMI - recorded: 35.71 Nutritional Status: BMI > 30  Obese Nutritional Risks: None Diabetes: Yes CBG done?: No Did pt. bring in CBG monitor from home?: No  Lab Results  Component Value Date   HGBA1C 6.1 01/21/2024   HGBA1C 5.9 01/28/2023   HGBA1C 6.1 01/16/2022     How often do you need to have someone help you when you read instructions, pamphlets, or other written materials from your doctor or pharmacy?: 1 - Never  Interpreter Needed?: No  Information entered by :: Zyana Amaro,  RMA   Activities of Daily Living     05/11/2024    3:14 PM 06/09/2023    9:50 AM  In your present state of health, do you have any difficulty performing the following activities:  Hearing? 0 0  Vision? 0 0  Difficulty concentrating or making decisions? 0 0  Walking or climbing stairs? 0 0  Dressing or bathing? 0 0  Doing errands, shopping? 0 0  Preparing Food and eating ? N N  Using the Toilet? N N  In the past six months, have you accidently leaked urine? N Y  Do you have problems with loss of bowel control? N N  Managing your Medications? N N  Managing your Finances? N N  Housekeeping or managing your Housekeeping? N N    Patient Care Team: Norleen Lynwood ORN, MD as PCP - General (Internal Medicine) Ottelin, Mark, MD (Inactive) (Urology) Floy Lynwood PARAS, MD (Inactive) (Otolaryngology) Glean Stephane BROCKS, RN (Inactive) as Oncology Nurse Navigator Tyree Nanetta SAILOR, RN as Oncology Nurse Navigator Ethyl Lonni BRAVO, MD (Inactive) as Consulting Physician (Otolaryngology) [place], Heather, OD as Consulting Physician (Optometry) Crawford, Morna Pickle, NP as Nurse Practitioner (Hematology and Oncology)  I have updated your Care Teams any recent Medical Services you may have received from other providers in the past year.     Assessment:   This is a routine wellness examination for [place].  Hearing/Vision screen Hearing Screening - Comments:: Denies hearing difficulties   Vision Screening - Comments:: Denies vision issues./Dr. [place]    Goals Addressed             This Visit's Progress    Patient Stated   On track    Maintain current health status.        Depression Screen     05/11/2024    3:24 PM 02/02/2024    2:33 PM 06/09/2023   10:01 AM 01/29/2023    8:37 AM 10/23/2022    2:55 PM 06/12/2022    9:56 AM 06/07/2022   11:24 AM  PHQ 2/9 Scores  PHQ - 2 Score 0 0 0 0 0 0 0  PHQ- 9 Score 0  0        Fall Risk     05/11/2024    3:20 PM 02/02/2024    2:39 PM  06/09/2023    9:56 AM 01/29/2023    8:37 AM 06/07/2022   11:18 AM  Fall Risk   Falls in the past year? 0 0 0 0 0  Number falls in past yr: 0 0 0 0 0  Injury with Fall? 0 0 0 0 0  Risk for fall due to :  No Fall Risks No Fall Risks No Fall Risks No Fall Risks  Follow up Falls evaluation completed;Falls prevention discussed Falls evaluation completed Falls prevention discussed;Falls evaluation completed Falls evaluation completed Falls prevention discussed      Data saved with a previous flowsheet row definition    MEDICARE RISK AT HOME:  Medicare Risk at Home Any stairs in or around the home?: Yes (outside) If so, are there any without handrails?: No Home free of loose throw rugs in walkways, pet beds, electrical cords, etc?: Yes Adequate lighting in your home to reduce risk of falls?: Yes Life alert?: Yes (does not wear it-per pt) Use of a cane, walker or w/c?: Yes (cane) Grab bars in the bathroom?: Yes Shower chair or bench in shower?: Yes Elevated toilet seat or a handicapped toilet?: Yes  TIMED UP AND GO:  Was the test performed?  No  Cognitive Function: Declined/Normal: No cognitive concerns noted by patient or family. Patient alert, oriented, able to answer questions appropriately and recall recent events. No signs of memory loss or confusion.        06/09/2023    9:57 AM 06/07/2022   11:25 AM  6CIT Screen  What Year? 0 points 0 points  What month? 0 points 0 points  What time? 0 points 0 points  Count back from 20 0 points 0 points  Months in reverse 0 points 0 points  Repeat phrase 0 points 0 points  Total Score 0 points 0 points    Immunizations Immunization History  Administered Date(s) Administered   Fluad Quad(high Dose 65+) 07/05/2019, 08/12/2020, 07/18/2022, 07/16/2023   Influenza Split 08/05/2011, 08/20/2012   Influenza Whole 07/18/2009   Influenza, High Dose Seasonal PF 09/06/2013, 09/13/2015, 08/12/2016, 07/16/2017, 07/21/2018   Influenza,inj,Quad  PF,6+ Mos 09/07/2014   Influenza-Unspecified 07/24/2021   PFIZER(Purple Top)SARS-COV-2 Vaccination 11/18/2019, 12/09/2019, 09/29/2020   Pfizer Covid-19 Vaccine Bivalent Booster 77yrs & up 07/10/2021, 08/01/2022, 07/28/2023   Pneumococcal Conjugate-13 09/27/2013   Pneumococcal Polysaccharide-23 08/05/2011   Td 10/15/2006   Tdap 09/29/2017   Zoster Recombinant(Shingrix) 02/04/2022, 04/10/2022   Zoster, Live 08/02/2010    Screening Tests Health Maintenance  Topic Date Due   Diabetic kidney evaluation - Urine ACR  Never done   INFLUENZA VACCINE  05/14/2024   HEMOGLOBIN A1C  07/22/2024   OPHTHALMOLOGY EXAM  10/19/2024   Diabetic kidney evaluation - eGFR measurement  01/20/2025   FOOT EXAM  02/01/2025   Medicare Annual Wellness (AWV)  05/11/2025   DTaP/Tdap/Td (3 - Td or Tdap) 09/30/2027   Pneumococcal Vaccine: 50+ Years  Completed   DEXA SCAN  Completed   Hepatitis C Screening  Completed   Zoster Vaccines- Shingrix  Completed   Hepatitis B Vaccines  Aged Out   HPV VACCINES  Aged Out   Meningococcal B Vaccine  Aged Out   Colonoscopy  Discontinued   COVID-19 Vaccine  Discontinued    Health Maintenance  Health Maintenance Due  Topic Date Due   Diabetic kidney evaluation - Urine ACR  Never done   Health Maintenance Items Addressed: See Nurse Notes at the end of this note  Additional Screening:  Vision Screening: Recommended  annual ophthalmology exams for early detection of glaucoma and other disorders of the eye. Would you like a referral to an eye doctor? No    Dental Screening: Recommended annual dental exams for proper oral hygiene  Community Resource Referral / Chronic Care Management: CRR required this visit?  No   CCM required this visit?  No   Plan:    I have personally reviewed and noted the following in the patient's chart:   Medical and social history Use of alcohol, tobacco or illicit drugs  Current medications and supplements including opioid  prescriptions. Patient is not currently taking opioid prescriptions. Functional ability and status Nutritional status Physical activity Advanced directives List of other physicians Hospitalizations, surgeries, and ER visits in previous 12 months Vitals Screenings to include cognitive, depression, and falls Referrals and appointments  In addition, I have reviewed and discussed with patient certain preventive protocols, quality metrics, and best practice recommendations. A written personalized care plan for preventive services as well as general preventive health recommendations were provided to patient.   Mamoudou Mulvehill L Kisha Messman, CMA   05/11/2024   After Visit Summary: (MyChart) Due to this being a telephonic visit, the after visit summary with patients personalized plan was offered to patient via MyChart   Notes: Patient stated that she has an eye exam appointment on May 19 2024.  Patient is due for a UACR and order has been placed today.  She stated that she will call the office to schedule her 6 month follow up visit.

## 2024-07-20 ENCOUNTER — Other Ambulatory Visit (HOSPITAL_COMMUNITY): Payer: Self-pay

## 2024-08-16 ENCOUNTER — Encounter: Payer: Self-pay | Admitting: Internal Medicine

## 2024-08-16 ENCOUNTER — Encounter: Payer: Self-pay | Admitting: Radiology

## 2024-08-16 DIAGNOSIS — E1165 Type 2 diabetes mellitus with hyperglycemia: Secondary | ICD-10-CM

## 2024-08-17 LAB — OPHTHALMOLOGY REPORT-SCANNED

## 2024-08-17 NOTE — Telephone Encounter (Signed)
 Chart has been updated.

## 2024-09-01 ENCOUNTER — Ambulatory Visit: Payer: Self-pay | Admitting: Internal Medicine

## 2024-09-01 ENCOUNTER — Other Ambulatory Visit (INDEPENDENT_AMBULATORY_CARE_PROVIDER_SITE_OTHER)

## 2024-09-01 DIAGNOSIS — E1165 Type 2 diabetes mellitus with hyperglycemia: Secondary | ICD-10-CM | POA: Diagnosis not present

## 2024-09-01 LAB — LIPID PANEL
Cholesterol: 125 mg/dL (ref 0–200)
HDL: 46.2 mg/dL (ref >39.00)
LDL Cholesterol: 66 mg/dL (ref 0–99)
NonHDL: 79.25
Total CHOL/HDL Ratio: 3
Triglycerides: 65 mg/dL (ref 0.0–149.0)
VLDL: 13 mg/dL (ref 0.0–40.0)

## 2024-09-01 LAB — BASIC METABOLIC PANEL WITH GFR
BUN: 20 mg/dL (ref 6–23)
CO2: 25 meq/L (ref 19–32)
Calcium: 9.6 mg/dL (ref 8.4–10.5)
Chloride: 106 meq/L (ref 96–112)
Creatinine, Ser: 0.75 mg/dL (ref 0.40–1.20)
GFR: 76.18 mL/min (ref >60.00)
Glucose, Bld: 124 mg/dL — ABNORMAL HIGH (ref 70–99)
Potassium: 3.6 meq/L (ref 3.5–5.1)
Sodium: 138 meq/L (ref 135–145)

## 2024-09-01 LAB — HEPATIC FUNCTION PANEL
ALT: 9 U/L (ref 0–35)
AST: 16 U/L (ref 0–37)
Albumin: 4.3 g/dL (ref 3.5–5.2)
Alkaline Phosphatase: 54 U/L (ref 39–117)
Bilirubin, Direct: 0 mg/dL (ref 0.0–0.3)
Total Bilirubin: 0.6 mg/dL (ref 0.2–1.2)
Total Protein: 6.9 g/dL (ref 6.0–8.3)

## 2024-09-01 LAB — HEMOGLOBIN A1C: Hgb A1c MFr Bld: 5.8 % (ref 4.6–6.5)

## 2024-09-01 LAB — LAB REPORT - SCANNED: Albumin/Creatinine Ratio, Urine, POC: 30

## 2024-09-01 NOTE — Progress Notes (Signed)
 The test results show that your current treatment is OK, as the tests are stable.  Please continue the same plan.  There is no other need for change of treatment or further evaluation based on these results, at this time.  thanks

## 2024-09-13 ENCOUNTER — Telehealth: Payer: Self-pay | Admitting: Internal Medicine

## 2024-09-13 NOTE — Telephone Encounter (Signed)
 Handicap placard form placed up front in Dr. Nicola box - please mail back to patient when done.

## 2024-09-13 NOTE — Telephone Encounter (Signed)
Form placed on provider desk.

## 2024-09-14 ENCOUNTER — Encounter: Payer: Self-pay | Admitting: Internal Medicine

## 2024-09-15 NOTE — Telephone Encounter (Signed)
 Form has been mailed out to Pt today.

## 2024-09-28 ENCOUNTER — Encounter: Payer: Self-pay | Admitting: Internal Medicine

## 2024-10-05 ENCOUNTER — Other Ambulatory Visit: Payer: Self-pay | Admitting: Hematology and Oncology

## 2024-10-22 ENCOUNTER — Encounter: Payer: Self-pay | Admitting: Internal Medicine

## 2024-10-22 MED ORDER — FUROSEMIDE 40 MG PO TABS: ORAL_TABLET | ORAL | 1 refills | Status: AC

## 2024-10-22 MED ORDER — FOSINOPRIL SODIUM 20 MG PO TABS: ORAL_TABLET | ORAL | 1 refills | Status: AC

## 2024-10-22 MED ORDER — SIMVASTATIN 20 MG PO TABS: 20.0000 mg | ORAL_TABLET | Freq: Every day | ORAL | 1 refills | Status: AC

## 2024-10-25 ENCOUNTER — Encounter: Payer: Self-pay | Admitting: Internal Medicine

## 2024-10-25 DIAGNOSIS — E2839 Other primary ovarian failure: Secondary | ICD-10-CM

## 2024-11-04 ENCOUNTER — Encounter: Payer: Self-pay | Admitting: Internal Medicine

## 2024-11-04 DIAGNOSIS — E2839 Other primary ovarian failure: Secondary | ICD-10-CM

## 2025-02-10 ENCOUNTER — Encounter: Admitting: Family Medicine

## 2025-04-21 ENCOUNTER — Ambulatory Visit: Admitting: Hematology and Oncology

## 2025-05-12 ENCOUNTER — Ambulatory Visit
